# Patient Record
Sex: Male | Born: 1955 | Race: White | Hispanic: No | Marital: Married | Smoking: Former smoker
Health system: Southern US, Community
[De-identification: ages and names within clinical notes are randomized; demographics above are authoritative.]

## PROBLEM LIST (undated history)

## (undated) DIAGNOSIS — R9430 Abnormal result of cardiovascular function study, unspecified: Secondary | ICD-10-CM

## (undated) DIAGNOSIS — E782 Mixed hyperlipidemia: Secondary | ICD-10-CM

## (undated) DIAGNOSIS — C61 Malignant neoplasm of prostate: Secondary | ICD-10-CM

## (undated) DIAGNOSIS — Z9861 Coronary angioplasty status: Secondary | ICD-10-CM

## (undated) DIAGNOSIS — I5030 Unspecified diastolic (congestive) heart failure: Secondary | ICD-10-CM

## (undated) DIAGNOSIS — IMO0002 Reserved for concepts with insufficient information to code with codable children: Secondary | ICD-10-CM

## (undated) DIAGNOSIS — I10 Essential (primary) hypertension: Secondary | ICD-10-CM

## (undated) DIAGNOSIS — K219 Gastro-esophageal reflux disease without esophagitis: Secondary | ICD-10-CM

## (undated) DIAGNOSIS — K7689 Other specified diseases of liver: Secondary | ICD-10-CM

## (undated) DIAGNOSIS — K5790 Diverticulosis of intestine, part unspecified, without perforation or abscess without bleeding: Secondary | ICD-10-CM

## (undated) DIAGNOSIS — N289 Disorder of kidney and ureter, unspecified: Secondary | ICD-10-CM

## (undated) DIAGNOSIS — I2510 Atherosclerotic heart disease of native coronary artery without angina pectoris: Secondary | ICD-10-CM

## (undated) DIAGNOSIS — K589 Irritable bowel syndrome without diarrhea: Secondary | ICD-10-CM

## (undated) DIAGNOSIS — I729 Aneurysm of unspecified site: Secondary | ICD-10-CM

## (undated) DIAGNOSIS — K7460 Unspecified cirrhosis of liver: Secondary | ICD-10-CM

## (undated) DIAGNOSIS — Z91041 Radiographic dye allergy status: Secondary | ICD-10-CM

## (undated) DIAGNOSIS — I509 Heart failure, unspecified: Secondary | ICD-10-CM

## (undated) DIAGNOSIS — E119 Type 2 diabetes mellitus without complications: Secondary | ICD-10-CM

## (undated) DIAGNOSIS — R1030 Lower abdominal pain, unspecified: Secondary | ICD-10-CM

## (undated) DIAGNOSIS — Z01810 Encounter for preprocedural cardiovascular examination: Secondary | ICD-10-CM

## (undated) HISTORY — DX: Essential (primary) hypertension: I10

## (undated) HISTORY — DX: Mixed hyperlipidemia: E78.2

## (undated) HISTORY — DX: Irritable bowel syndrome, unspecified: K58.9

## (undated) HISTORY — DX: Atherosclerotic heart disease of native coronary artery without angina pectoris: I25.10

## (undated) HISTORY — DX: Morbid (severe) obesity due to excess calories: E66.01

## (undated) HISTORY — DX: Reserved for concepts with insufficient information to code with codable children: IMO0002

## (undated) HISTORY — PX: VASECTOMY: SHX75

## (undated) HISTORY — DX: Aneurysm of unspecified site: I72.9

## (undated) HISTORY — DX: Lower abdominal pain, unspecified: R10.30

## (undated) HISTORY — DX: Abnormal result of cardiovascular function study, unspecified: R94.30

## (undated) HISTORY — DX: Other specified diseases of liver: K76.89

## (undated) HISTORY — DX: Unspecified cirrhosis of liver: K74.60

## (undated) HISTORY — DX: Radiographic dye allergy status: Z91.041

## (undated) HISTORY — PX: CARPAL TUNNEL RELEASE: SHX101

## (undated) HISTORY — DX: Encounter for preprocedural cardiovascular examination: Z01.810

## (undated) HISTORY — PX: BACK SURGERY: SHX140

## (undated) HISTORY — PX: OTHER SURGICAL HISTORY: SHX169

## (undated) HISTORY — DX: Malignant neoplasm of prostate: C61

## (undated) HISTORY — DX: Diverticulosis of intestine, part unspecified, without perforation or abscess without bleeding: K57.90

## (undated) HISTORY — PX: CARDIAC CATHETERIZATION: SHX172

## (undated) HISTORY — DX: Unspecified diastolic (congestive) heart failure: I50.30

## (undated) HISTORY — DX: Gastro-esophageal reflux disease without esophagitis: K21.9

## (undated) HISTORY — DX: Coronary angioplasty status: Z98.61

---

## 1998-06-27 HISTORY — PX: CHOLECYSTECTOMY: SHX55

## 2003-03-03 ENCOUNTER — Ambulatory Visit (HOSPITAL_COMMUNITY): Admission: RE | Admit: 2003-03-03 | Discharge: 2003-03-03 | Payer: Self-pay | Admitting: Internal Medicine

## 2003-03-03 HISTORY — PX: COLONOSCOPY: SHX174

## 2003-03-03 HISTORY — PX: UPPER GASTROINTESTINAL ENDOSCOPY: SHX188

## 2003-07-30 ENCOUNTER — Ambulatory Visit (HOSPITAL_COMMUNITY): Admission: RE | Admit: 2003-07-30 | Discharge: 2003-07-31 | Payer: Self-pay | Admitting: Cardiology

## 2003-08-03 ENCOUNTER — Observation Stay (HOSPITAL_COMMUNITY): Admission: EM | Admit: 2003-08-03 | Discharge: 2003-08-04 | Payer: Self-pay | Admitting: Cardiology

## 2004-10-16 ENCOUNTER — Ambulatory Visit: Payer: Self-pay | Admitting: Cardiology

## 2004-11-01 ENCOUNTER — Ambulatory Visit: Payer: Self-pay | Admitting: Cardiology

## 2004-11-07 ENCOUNTER — Ambulatory Visit: Payer: Self-pay | Admitting: Cardiology

## 2004-11-07 ENCOUNTER — Inpatient Hospital Stay (HOSPITAL_BASED_OUTPATIENT_CLINIC_OR_DEPARTMENT_OTHER): Admission: RE | Admit: 2004-11-07 | Discharge: 2004-11-07 | Payer: Self-pay | Admitting: Cardiology

## 2004-11-23 ENCOUNTER — Ambulatory Visit: Payer: Self-pay | Admitting: Cardiology

## 2004-11-27 ENCOUNTER — Ambulatory Visit: Payer: Self-pay | Admitting: Internal Medicine

## 2004-12-13 ENCOUNTER — Ambulatory Visit: Payer: Self-pay | Admitting: Cardiology

## 2005-01-18 ENCOUNTER — Ambulatory Visit: Payer: Self-pay | Admitting: Internal Medicine

## 2005-07-31 ENCOUNTER — Ambulatory Visit: Payer: Self-pay | Admitting: Internal Medicine

## 2005-10-18 ENCOUNTER — Ambulatory Visit: Payer: Self-pay | Admitting: Cardiology

## 2005-10-22 ENCOUNTER — Ambulatory Visit: Payer: Self-pay | Admitting: Cardiology

## 2005-10-22 ENCOUNTER — Inpatient Hospital Stay (HOSPITAL_BASED_OUTPATIENT_CLINIC_OR_DEPARTMENT_OTHER): Admission: RE | Admit: 2005-10-22 | Discharge: 2005-10-22 | Payer: Self-pay | Admitting: Cardiology

## 2005-10-26 ENCOUNTER — Ambulatory Visit: Payer: Self-pay | Admitting: Cardiology

## 2005-11-07 ENCOUNTER — Ambulatory Visit: Payer: Self-pay | Admitting: Cardiology

## 2005-12-05 ENCOUNTER — Ambulatory Visit: Payer: Self-pay | Admitting: Cardiology

## 2006-03-14 ENCOUNTER — Ambulatory Visit: Payer: Self-pay | Admitting: Internal Medicine

## 2006-05-28 ENCOUNTER — Ambulatory Visit: Payer: Self-pay | Admitting: Cardiology

## 2006-11-07 ENCOUNTER — Ambulatory Visit: Payer: Self-pay | Admitting: Internal Medicine

## 2007-03-18 ENCOUNTER — Ambulatory Visit: Payer: Self-pay | Admitting: Cardiology

## 2007-03-18 ENCOUNTER — Inpatient Hospital Stay (HOSPITAL_COMMUNITY): Admission: AD | Admit: 2007-03-18 | Discharge: 2007-03-20 | Payer: Self-pay | Admitting: Cardiovascular Disease

## 2007-03-26 ENCOUNTER — Ambulatory Visit: Payer: Self-pay | Admitting: Physician Assistant

## 2007-03-27 ENCOUNTER — Ambulatory Visit (HOSPITAL_COMMUNITY): Admission: RE | Admit: 2007-03-27 | Discharge: 2007-03-27 | Payer: Self-pay | Admitting: Cardiovascular Disease

## 2007-03-27 ENCOUNTER — Ambulatory Visit: Payer: Self-pay | Admitting: Vascular Surgery

## 2007-04-01 ENCOUNTER — Encounter: Payer: Self-pay | Admitting: Physician Assistant

## 2007-04-01 ENCOUNTER — Ambulatory Visit: Payer: Self-pay | Admitting: Cardiology

## 2007-08-26 ENCOUNTER — Ambulatory Visit: Payer: Self-pay | Admitting: Cardiology

## 2007-08-28 HISTORY — PX: OTHER SURGICAL HISTORY: SHX169

## 2007-09-05 ENCOUNTER — Encounter: Payer: Self-pay | Admitting: Cardiology

## 2008-09-02 ENCOUNTER — Ambulatory Visit: Payer: Self-pay | Admitting: Cardiology

## 2008-09-02 ENCOUNTER — Encounter: Payer: Self-pay | Admitting: Physician Assistant

## 2008-09-10 ENCOUNTER — Ambulatory Visit: Payer: Self-pay | Admitting: Cardiology

## 2008-09-10 ENCOUNTER — Encounter: Payer: Self-pay | Admitting: Physician Assistant

## 2008-10-07 ENCOUNTER — Ambulatory Visit: Payer: Self-pay | Admitting: Cardiology

## 2008-12-07 ENCOUNTER — Ambulatory Visit: Payer: Self-pay | Admitting: Cardiology

## 2008-12-16 ENCOUNTER — Encounter: Payer: Self-pay | Admitting: Cardiology

## 2009-01-10 ENCOUNTER — Encounter: Payer: Self-pay | Admitting: Cardiology

## 2009-08-01 ENCOUNTER — Encounter: Payer: Self-pay | Admitting: Cardiology

## 2009-08-01 ENCOUNTER — Ambulatory Visit: Payer: Self-pay | Admitting: Cardiology

## 2009-08-01 ENCOUNTER — Encounter: Payer: Self-pay | Admitting: Physician Assistant

## 2009-08-02 ENCOUNTER — Encounter: Payer: Self-pay | Admitting: Cardiology

## 2009-09-19 ENCOUNTER — Encounter: Payer: Self-pay | Admitting: Cardiology

## 2009-10-27 ENCOUNTER — Encounter: Payer: Self-pay | Admitting: Cardiology

## 2009-11-02 ENCOUNTER — Encounter: Payer: Self-pay | Admitting: Cardiology

## 2009-11-07 ENCOUNTER — Encounter: Payer: Self-pay | Admitting: Cardiology

## 2009-11-23 ENCOUNTER — Encounter: Payer: Self-pay | Admitting: Cardiology

## 2009-12-26 ENCOUNTER — Ambulatory Visit: Payer: Self-pay | Admitting: Cardiology

## 2009-12-26 DIAGNOSIS — K7689 Other specified diseases of liver: Secondary | ICD-10-CM | POA: Insufficient documentation

## 2009-12-26 DIAGNOSIS — E785 Hyperlipidemia, unspecified: Secondary | ICD-10-CM | POA: Insufficient documentation

## 2009-12-26 DIAGNOSIS — I2510 Atherosclerotic heart disease of native coronary artery without angina pectoris: Secondary | ICD-10-CM | POA: Insufficient documentation

## 2010-02-09 ENCOUNTER — Encounter: Payer: Self-pay | Admitting: Cardiology

## 2010-09-26 NOTE — Letter (Signed)
Summary: Letter/ FAXED [facility]  Letter/ FAXED [facility]   Imported By: Bartholomew Boards 02/09/2010 11:09:04  _____________________________________________________________________  External Attachment:    Type:   Image     Comment:   External Document

## 2010-09-26 NOTE — Consult Note (Signed)
Summary: CARDIOLOGY CONSULT/ [place] [facility]/ [facility]   Imported By: Delfino Lovett 10/31/2009 09:44:28  _____________________________________________________________________  External Attachment:    Type:   Image     Comment:   External Document

## 2010-09-26 NOTE — Letter (Signed)
Summary: [place] D/C DR. Weldon Picking [facility]  [facility] D/C DR. [name]   Imported By: Delfino Lovett 11/23/2009 10:01:18  _____________________________________________________________________  External Attachment:    Type:   Image     Comment:   External Document

## 2010-09-26 NOTE — Letter (Signed)
Summary: [facility] Correspondence/ [facility]  External Correspondence/ [facility]   Imported By: Bartholomew Boards 10/28/2009 15:55:05  _____________________________________________________________________  External Attachment:    Type:   Image     Comment:   External Document

## 2010-09-26 NOTE — Assessment & Plan Note (Signed)
Summary: eph r/s from no show   Visit Type:  Follow-up Primary Provider:  Dr. [name]   History of Present Illness: the patient is a 55 year old male with a history of prior coronary intervention with a bare-metal stent most recently 2008 of the circumflex coronary artery. The patient was hospitalized in December of 2010 for subdural chest pain. He ruled out for myocardial infarction by enzymes. Cardiolite stress study was performed which was negative for ischemia. The patient had normal LV function. The patient states he is doing well. He reports no chest pain shortness of breath orthopnea PND. He has quit smoking. Although his compliance with his medical regimen is not certain about his medications. He stated his wife keeps track of his drug regimen. from a cardiovascular standpoint the patient is otherwise stable he does have a history of steatohepatosis and is followed by Dr. Laural Golden  Preventive Screening-Counseling & Management  Alcohol-Tobacco     Smoking Status: quit  Comments: Pt quit smoking 10 yrs ago, smoked for about 30 yrs.  Current Problems (verified): 1)  Fatty Liver Disease  (ICD-571.8) 2)  Dyslipidemia  (ICD-272.4) 3)  Coronary Artery Disease, S/p Ptca  (ICD-414.9) 4)  Coronary Artery Disease  (ICD-414.00)  Current Medications (verified): 1)  Furosemide 40 Mg Tabs (Furosemide) .... Take 1 Tablet By Mouth Once A Day 2)  Amlodipine Besylate 10 Mg Tabs (Amlodipine Besylate) .... Take 1 Tablet By Mouth Once A Day 3)  Nitroglycerin 0.4 Mg Subl (Nitroglycerin) .... Place 1 Tablet Under Tongue As Directed 4)  Simvastatin 80 Mg Tabs (Simvastatin) .... Take One Tablet By Mouth Daily At Bedtime 5)  Prilosec Otc 20 Mg Tbec (Omeprazole Magnesium) .... Take 1 Tablet By Mouth Two Times A Day 6)  Aspir-Low 81 Mg Tbec (Aspirin) .... Take 1 Tablet By Mouth Once A Day 7)  Toprol Xl 25 Mg Xr24h-Tab (Metoprolol Succinate) .... Take 1 Tablet By Mouth Once A Day 8)  Lisinopril 40 Mg  Tabs (Lisinopril) .... Take 1 Tablet By Mouth Once A Day 9)  Ursodiol 250 Mg Tabs (Ursodiol) .... Take 1 Tablet By Mouth Once A Day 10)  Multivitamins  Tabs (Multiple Vitamin) .... Take 1 Tablet By Mouth Once A Day 11)  Vitamin E 400 Unit Caps (Vitamin E) .... Take 1 Tablet By Mouth Once A Day 12)  Spironolactone 25 Mg Tabs (Spironolactone) .... Take 1 Tablet By Mouth Once A Day 13)  Potassium 99 Mg Tabs (Potassium) .... Take 1 Tablet By Mouth Once A Day  Allergies (verified): 1)  ! * Ivp Dye 2)  Plavix (Clopidogrel Bisulfate)  Comments:  Nurse/Medical Assistant: The patient's medications were reviewed with the patient and were updated in the Medication List. Pt brought list that does not have dosages. He does not know his medications. He has been instructed to contact our office with accurate medication list.  Gurney Maxin, RN, BSN (Dec 26, 2009 2:42 PM)  Clinical Review Panels:  Cardiac Imaging Cardiac Cath Findings severe obtuse marginal one stenosis moderate diffuse LAD and RCA disease normal LVEF status post bare-metal stenting of left circumflex. (03/20/2007)    Past History:  Past Medical History: Last updated: 10/08/2008 multivessel CAD A.  status post multiple prior PCI's B.  status post bare-metal stenting of CFX, July 3817 C.  complicated by right femoral artery pseudoaneurysm D.  normal LVEF Plavix allergy A.  previously treated with Ticlid liver cirrhosis A.  non-alcoholic steatohepatitis mixed dyslipidemia contrast dye intolerance, secondary to tachycardia hypertension morbid  obesity stable, small right lung nodules chronic lower extremity edema GERD diverticulosis irritable bowel syndrome  Past Surgical History: Last updated: 10/08/2008 status post cholecystectomy status post right knee surgery status post vasectomy  Family History: Last updated: 10/08/2008 Family History of Coronary Artery Disease:   Social History: Last updated:  10/08/2008 Tobacco Use - No.  Alcohol Use - no  Risk Factors: Smoking Status: quit (12/26/2009)  Social History: Smoking Status:  quit  Review of Systems  The patient denies fatigue, malaise, fever, weight gain/loss, vision loss, decreased hearing, hoarseness, chest pain, palpitations, shortness of breath, prolonged cough, wheezing, sleep apnea, coughing up blood, abdominal pain, blood in stool, nausea, vomiting, diarrhea, heartburn, incontinence, blood in urine, muscle weakness, joint pain, leg swelling, rash, skin lesions, headache, fainting, dizziness, depression, anxiety, enlarged lymph nodes, easy bruising or bleeding, and environmental allergies.    Vital Signs:  Patient profile:   55 year old male Height:      74 inches Weight:      317 pounds BMI:     40.85 Pulse rate:   58 / minute BP sitting:   133 / 74  (left arm) Cuff size:   large  Vitals Entered By: Gurney Maxin, RN, BSN (Dec 26, 2009 2:34 PM)  Nutrition Counseling: Patient's BMI is greater than 25 and therefore counseled on weight management options. Comments Follow up visit    Physical Exam  Additional Exam:  General: Well-developed, well-nourished in no distress head: Normocephalic and atraumatic eyes PERRLA/EOMI intact, conjunctiva and lids normal nose: No deformity or lesions mouth normal dentition, normal posterior pharynx neck: Supple, no JVD.  No masses, thyromegaly or abnormal cervical nodes lungs: Normal breath sounds bilaterally without wheezing.  Normal percussion heart: regular rate and rhythm with normal S1 and S2, no S3 or S4.  PMI is normal.  No pathological murmurs abdomen: Normal bowel sounds, abdomen is soft and nontender without masses, organomegaly or hernias noted.  No hepatosplenomegaly musculoskeletal: Back normal, normal gait muscle strength and tone normal pulsus: Pulse is normal in all 4 extremities Extremities: No peripheral pitting edema neurologic: Alert and oriented x 3 skin:  Intact without lesions or rashes cervical nodes: No significant adenopathy psychologic: Normal affect    Impression & Recommendations:  Problem # 1:  CORONARY ARTERY DISEASE, S/P PTCA (ICD-414.9) the patient had a stress test a few months ago. This was negative for ischemia. He reports no recurrent chest pain. Continue current medical regimen His updated medication list for this problem includes:    Amlodipine Besylate 10 Mg Tabs (Amlodipine besylate) .Marland Kitchen... Take 1 tablet by mouth once a day    Nitroglycerin 0.4 Mg Subl (Nitroglycerin) .Marland Kitchen... Place 1 tablet under tongue as directed    Aspir-low 81 Mg Tbec (Aspirin) .Marland Kitchen... Take 1 tablet by mouth once a day    Toprol Xl 25 Mg Xr24h-tab (Metoprolol succinate) .Marland Kitchen... Take 1 tablet by mouth once a day    Lisinopril 40 Mg Tabs (Lisinopril) .Marland Kitchen... Take 1 tablet by mouth once a day  Problem # 2:  DYSLIPIDEMIA (ICD-272.4) the patient will remain a lipid lowering drug therapy. He states that his primary care physician is following his lipid panel. His updated medication list for this problem includes:    Simvastatin 80 Mg Tabs (Simvastatin) .Marland Kitchen... Take one tablet by mouth daily at bedtime  Problem # 3:  FATTY LIVER DISEASE (ICD-571.8) Assessment: Comment Only  Patient Instructions: 1)  Please, verfiy medications, dosages and frequency and contact our office with any  differences from this list.  2)  Follow up in  1 year.

## 2010-09-26 NOTE — Letter (Signed)
Summary: Appointment -missed   [facility] [address]   [place], [place] [address]   Phone: [phone]  Fax: [phone]     November 23, 2009 MRN: [id number]     [place]  [address]     Dear Billy Davis,  Our records indicate you missed your appointment on November 23, 2009                        with Dr.  Dannielle Burn.   It is very important that we reach you to reschedule this appointment. We look forward to participating in your health care needs.   Please contact us at the number listed above at your earliest convenience to reschedule this appointment.   Sincerely,    Public relations account executive

## 2010-12-06 DIAGNOSIS — R079 Chest pain, unspecified: Secondary | ICD-10-CM

## 2010-12-27 ENCOUNTER — Other Ambulatory Visit: Payer: Self-pay | Admitting: Cardiology

## 2010-12-27 ENCOUNTER — Encounter: Payer: Self-pay | Admitting: Cardiology

## 2010-12-27 ENCOUNTER — Ambulatory Visit (INDEPENDENT_AMBULATORY_CARE_PROVIDER_SITE_OTHER): Payer: Managed Care, Other (non HMO) | Admitting: Cardiology

## 2010-12-27 ENCOUNTER — Encounter: Payer: Self-pay | Admitting: *Deleted

## 2010-12-27 VITALS — BP 126/83 | HR 91 | Ht 74.0 in | Wt 330.5 lb

## 2010-12-27 DIAGNOSIS — K7689 Other specified diseases of liver: Secondary | ICD-10-CM

## 2010-12-27 DIAGNOSIS — R072 Precordial pain: Secondary | ICD-10-CM

## 2010-12-27 DIAGNOSIS — I5189 Other ill-defined heart diseases: Secondary | ICD-10-CM | POA: Insufficient documentation

## 2010-12-27 DIAGNOSIS — K219 Gastro-esophageal reflux disease without esophagitis: Secondary | ICD-10-CM | POA: Insufficient documentation

## 2010-12-27 DIAGNOSIS — I10 Essential (primary) hypertension: Secondary | ICD-10-CM | POA: Insufficient documentation

## 2010-12-27 DIAGNOSIS — I2510 Atherosclerotic heart disease of native coronary artery without angina pectoris: Secondary | ICD-10-CM

## 2010-12-27 DIAGNOSIS — I259 Chronic ischemic heart disease, unspecified: Secondary | ICD-10-CM

## 2010-12-27 DIAGNOSIS — I519 Heart disease, unspecified: Secondary | ICD-10-CM

## 2010-12-27 NOTE — Patient Instructions (Signed)
   2 day Exercise Cardiolite - hold beta blockers x 24 hours prior to test If the results of your test are normal or stable, you will receive a letter.  If they are abnormal, the nurse will contact you by phone. Your physician wants you to follow up in: 6 months.  You will receive a reminder letter in the mail one-two months in advance.  If you don't receive a letter, please call our office to schedule the follow up appointment

## 2010-12-27 NOTE — Assessment & Plan Note (Signed)
Blood pressure controlled. Continue current medical therapy

## 2010-12-27 NOTE — Assessment & Plan Note (Signed)
His negative stress test the patient will need an appointment with gastroenterology

## 2010-12-27 NOTE — Assessment & Plan Note (Addendum)
Coronary artery disease: Stop status post multiple PCI's details as outlined above. Nuclear perfusion study December 2000 and negative for ischemia. The patient was never scheduled for his Cardiolite study. We will schedule him for a two-day Cardiolite study and we'll hold his beta blocker 24 hours prior to the test. I told the patient is test is positive he may need a heart catheterization. He has had complications with a pseudoaneurysm and still has some pain in the right groin. A future catheterization will need to be done from the arm. Of note is that the patient has a contrast dye allergy

## 2010-12-27 NOTE — Progress Notes (Signed)
HPI The patient is a 55 year old male recently admitted to [facility]. He has history of coronary artery disease status post prior multiple PCI's. He status post bare-metal stent to the circumflex in August 7482 complicated by right femoral aneurysms. His last catheterization also showed moderate diffuse disease in the RCA and LAD. He had a normal nuclear study in December of 2010 with an ejection fraction of 53%. Of note is that the patient is a contrast allergy. He has chronic diastolic dysfunction chronic lower extremity edema. The patient recently presented with atypical chest pain. He ruled out for myocardial infarction had no EKG changes. The patient's chest pain actually did not resolve with nitroglycerin and spontaneously aborted. At the time of consultation the patient was walking in the hall without any symptoms. Given his ongoing risk profile we decided to perform an outpatient Cardiolite study. In addition the patient has a history of nonalcoholic cirrhosis marked steatosis as well as hypertension and irritable bowel syndrome. Unfortunate patient never got scheduled to the hospital for his Cardiolite study. He tells me however that the insurance company has already improved the test. His symptoms have really not improved much since hospitalization. He still gets occasional chest pain but it's mainly when he sitting still. He personally thinks is related to his stomach and he started scheduled appointment with the gastroenterologist. Typically the pain lasts a few minutes somewhat more pronounced postprandial and appears to be no exercise component related to it. Also nitroglycerin does not improve the pain.  Allergies  Allergen Reactions  . Clopidogrel Bisulfate     REACTION: Rash    No current outpatient prescriptions on file prior to visit.    Past Medical History  Diagnosis Date  . CAD, multiple vessel   . Mixed dyslipidemia   . Unspecified essential hypertension   . Chest  pain, unspecified   . Cirrhosis of liver without mention of alcohol   . Esophageal reflux   . Unspecified diastolic heart failure   . Coronary atherosclerosis of native coronary artery   . Other chronic nonalcoholic liver disease   . Postsurgical percutaneous transluminal coronary angioplasty status   . Morbid obesity   . Diverticulosis   . IBS (irritable bowel syndrome)     Past Surgical History  Procedure Date  . Cholecystectomy   . Right knee surgery   . Vasectomy     Family History  Problem Relation Age of Onset  . Coronary artery disease      family history    History   Social History  . Marital Status: Married    Spouse Name: MARTHA    Number of Children: N/A  . Years of Education: N/A   Occupational History  . [employer]    Social History Main Topics  . Smoking status: Former Smoker -- 3.0 packs/day for 30 years    Types: Cigarettes    Quit date: 08/27/1998  . Smokeless tobacco: Never Used   Comment: Pt quit smoking 10 yrs ago, smoked for about 30 yrs.  . Alcohol Use: No  . Drug Use: No  . Sexually Active: Not on file   Other Topics Concern  . Not on file   Social History Narrative   Lives in [place], [place] at [place] of systems:Pertinent positives as outlined above. The remainder of the 18  point review of systems is negative  PHYSICAL EXAM BP 126/83  Pulse 91  Ht 6' 2"  (1.88 m)  Wt 330 lb 8 oz (  149.914 kg)  BMI 42.43 kg/m2  General: Well-developed, well-nourished in no distress Head: Normocephalic and atraumatic Eyes:PERRLA/EOMI intact, conjunctiva and lids normal Ears: No deformity or lesions Mouth:normal dentition, normal posterior pharynx Neck: Supple, no JVD.  No masses, thyromegaly or abnormal cervical nodes Lungs: Normal breath sounds bilaterally without wheezing.  Normal percussion Cardiac: regular rate and rhythm with normal S1 and S2, no S3 or S4.  PMI is normal.  No pathological murmurs Abdomen: Normal bowel  sounds, abdomen is soft and nontender without masses, organomegaly or hernias noted.  No hepatosplenomegaly MSK: Back normal, normal gait muscle strength and tone normal Vascular: Pulse is normal in all 4 extremities Extremities: No peripheral pitting edema Neurologic: Alert and oriented x 3 Skin: Intact without lesions or rashes Lymphatics: No significant adenopathy Psychologic: Normal affect  ECG:  ASSESSMENT AND PLAN

## 2011-01-09 ENCOUNTER — Encounter: Payer: Self-pay | Admitting: Cardiology

## 2011-01-09 DIAGNOSIS — I2510 Atherosclerotic heart disease of native coronary artery without angina pectoris: Secondary | ICD-10-CM

## 2011-01-09 NOTE — Assessment & Plan Note (Signed)
[place] OFFICE NOTE   ANANT, AGARD                       MRN:          [id number]  DATE:03/26/2007                            DOB:          [birth year]    CARDIOLOGIST:  Ernestine Mcmurray, MD.   PRIMARY CARE PHYSICIAN:  [name], MD.   HISTORY OF PRESENT ILLNESS:  [name] is a 55 year old male patient  with a history of coronary disease, who recently presented with angina.  He underwent cardiac catheterization and percutaneous coronary  intervention of the obtuse marginal branch by Dr. Burt Knack March 20, 2007.  The patient was discharged to home in stable condition.  He called the  office and noted that he was having a lot of groin pain and was added  onto the schedule today.   The patient notes that when he left the hospital he was having some mild  groin and scrotal pain but it has continued to get worse since he has  been home.  He denies fevers or chills.  Denies any bleeding or drainage  from the catheterization site.  Denies any significant back pain.  Denies any syncope.   He denies any recurrent chest pain or shortness of breath.   CURRENT MEDICATIONS:  1. Prilosec OTC.  2. Urso 250 mg daily.  3. Aspirin 325 mg daily.  4. Metoprolol 50 mg b.i.d.  5. Dicyclomine 20 mg daily.  6. Fish oil.  7. Lisinopril 40 mg daily.  8. Generic Lotrel 10/40 mg daily.   ALLERGIES:  PREDNISONE causes increased heart rate.  IVP DYE causes  increased heart rate.  He is allergic to PLAVIX.   PHYSICAL EXAMINATION:  He is a well-nourished, well-developed male in  obvious discomfort.  Blood pressure of 151/91, pulse 74.  HEENT:  Normal.  NECK:  Without JVD.  CARDIAC:  S1-S2.  Regular rate and rhythm without murmurs.  LUNGS:  Clear to auscultation bilaterally without wheeze, rhonchi or  rales.  ABDOMEN:  Soft and nontender.  EXTREMITIES:  Without edema.  GENITOURINARY:  Right groin with questionable  small hematoma.  No  pulsatile mass.  No bruit.  His scrotum is three times normal size.  His  testicles are exquisitely tender to light palpation.  His entire scrotal  sac is ecchymotic.   IMPRESSION:  1. Right groin hematoma and massive scrotal swelling, status post      cardiac catheterization.  2. Coronary artery disease.      a.     Status post bare metal stent to the obtuse marginal #1.      b.     History of stenting to the right coronary artery in 2004.  3. Preserved left ventricular function.  4. History of nonalcoholic cirrhosis.  5. History of PLAVIX allergy.  6. History of IV DYE intolerance.  7. Hypertension.  8. Dyslipidemia.   PLAN:  The patient presents to the office today for a chief complaint of  right groin and scrotal pain since cardiac catheterization.  I am  concerned about the contents of his scrotum.  There very  well could be  vascular compromise.  I think his groin overall is fairly stable.  In  any event, we will set him up for a right groin ultrasound to rule out  pseudoaneurysm.  We will also get a scrotal ultrasound.  We will also  get a CBC.  I discussed the patient's case today with Dr. Dannielle Burn over  the telephone.  We will also set him up for urgent referral to urology  for further evaluation.  Hopefully, we can get him in to the urologist  in [place].  If not, we will set him up with one of the urologists in  [place] today.  He will keep his scheduled follow-up next week.      Richardson Dopp, PA-C  Electronically Signed      Ernestine Mcmurray, MD,FACC  Electronically Signed   SW/MedQ  DD: 03/26/2007  DT: 03/26/2007  Job #: [id number]   cc:   [name], MD

## 2011-01-09 NOTE — Assessment & Plan Note (Signed)
[facility] OFFICE NOTE   Billy Davis, Billy Davis                       MRN:          [id number]  DATE:04/01/2007                            DOB:          1956/01/03    REASON FOR VISIT:  Scheduled post hospital followup.   [name] returns to the clinic today for his formal post hospital  followup.  However, he was just here on March 26, 2007 when he was seen  as an add-on for evaluation of significant discomfort and swelling of  the right groin incision site.  He was seen here in the clinic by Richardson Dopp PA-C, who referred him for immediate ultrasound of the groin for  evaluation of a pseudoaneurysm.  This, in fact, was the case, but  patient also had significant scrotal swelling and was, therefore, also  referred to a urologist. Reportedly, there was no intrinsic pathology of  the scrotum or testicles and, therefore, was all attributed to edema  related to his recent catheterization.   Patient informs me today that he did present for scheduled  pseudoaneurysm compression at [facility] with Dr. Sherren Mocha last week. However, they felt that this was already showing  evidence of thrombosis and, therefore, recommendation was to continue  treating conservatively.   Clinically, [name] reports significant improvement of the scrotal  edema over these last 5 days or so.  He is not experiencing any pain.  He also has no chest pain.  Of note, patient has requested clearance to  return to work as of today.   Electrocardiogram today revealed sinus bradycardia at 56 BPM with normal  axis, and no ischemic changes.   MEDICATIONS:  1. Full-dose aspirin.  2. Ticlid 250 mg b.i.d.  3. Metoprolol 50 b.i.d.  4. Prilosec OTC 20 daily.  5. Lisinopril 40 daily.  6. Lotrel 10/40 daily.   PHYSICAL EXAMINATION:  Blood pressure 133/80.  Pulse 57, regular.  Weight 305.8.  GENERAL:  A 55 year old male,  morbidly obese, sitting upright in no  distress.  HEENT:  Normocephalic and atraumatic.  NECK:  Palpable carotid pulses without bruits.  LUNGS:  Clear to auscultation in all fields.  HEART:  Regular rate and rhythm (S1 and S2).  No significant murmurs.  No rubs.  ABDOMEN:  Protuberant, nontender.  EXTREMITIES:  Right groin reveals no peri-incisional ecchymosis or  hematoma.  Palpable femoral pulse with no associated bruit.  The scrotal  area notable for some faint area of bruising with no edema of the  scrotal sac.  Distal right posterior tibialis pulses intact.  NEUROLOGIC:  No focal deficits.   IMPRESSION:  1. Status post right femoral artery pseudoaneurysm.      a.     Managed conservatively.  2. Coronary artery disease.      a.     Status post recent bare-metal stent 1st obtuse marginal       artery.      b.     Moderate residual left anterior descending and right       coronary artery disease.  c.     Normal left ventricular function.      d.     Status post TAXUS stenting mid right coronary artery,       December 2004.  3. Plavix allergy.      a.     On Ticlid.  4. Contrast dye intolerance.      a.     Secondary to tachycardia.  5. Morbid obesity.  6. Hypertension.  7. Dyslipidemia.  8. History of nonalcoholic cirrhosis.   PLAN:  1. Schedule repeat right groin ultrasound for reassessment of      pseudoaneurysm and assurance that this has completely thrombosed.  2. Check 2-week followup CBC on Ticlid, as recommended.  3. Continue Ticlid for full 4 weeks.  Patient will then remain on      aspirin indefinitely.  4. Check fasting lipid profile for reassessment of current lipid      management regimen.  Patient has inquired in to the feasibility of      adding red yeast rice to his current regimen.  5. Schedule return clinic followup with myself and Dr. Dannielle Burn in 3      months.      Gene Serpe, PA-C  Electronically Signed      Ernestine Mcmurray, MD,FACC   Electronically Signed   GS/MedQ  DD: 04/01/2007  DT: 04/01/2007  Job #: [id number]   cc:   [name], MD

## 2011-01-09 NOTE — Cardiovascular Report (Signed)
Billy Davis, Billy Davis                ACCOUNT NO.:  [id number]   MEDICAL RECORD NO.:  [id number]          PATIENT TYPE:  INP   LOCATION:  6527                         FACILITY:  [facility]   PHYSICIAN:  Juanda Bond. Burt Knack, MD  DATE OF BIRTH:  1956-05-26   DATE OF PROCEDURE:  03/20/2007  DATE OF DISCHARGE:  03/20/2007                            CARDIAC CATHETERIZATION   PROCEDURE:  Left heart catheterization, selective coronary angiography,  left ventricular angiography and PTCA and stenting of the first OM  branch of the left circumflex.   INDICATIONS:  Billy Davis is a 55 year old gentleman with known coronary  artery disease.  He presents with his typical angina.  His symptoms are  crescendo in nature, and he was hospitalized and referred for cardiac  catheterization.  He has a prior stenting of the mid-right coronary  artery.   Risks and indications of procedure were explained to the patient in  detail.  Informed consent was obtained.  The right groin was prepped and  draped and anesthetized with 1% lidocaine.  Using modified Seldinger  technique, a 6-French sheath was placed in the right femoral artery.  Multiple views of the left and right coronary arteries were taken using  standard Judkins catheters.  Following selective coronary angiography,  an angled pigtail was inserted in the left ventricle, and pressures were  recorded.  Left ventriculogram was performed, and a pullback across the  aortic valve was done.   At the conclusion of the diagnostic procedure, I elected to intervene on  an ulcerated lesion in the first OM branch of the left circumflex.  Angiomax was used for anticoagulation.  The patient was given 500 mg of  ticlopidine as he has of Plavix allergy.  Once therapeutic ACT was  achieved, Cougar guidewire was passed into the distal OM branch.  The  lesion was predilated with a 2.5 x 15-mm Maverick balloon.  Following  predilatation, a 3.5 x 28-mm Liberte stent was deployed  at 16  atmospheres.  There was excellent stent expansion with TIMI 3 flow in  the vessel following placement.  The stent was then postdilated with 4.0  x 20-mm DuraStar noncompliant balloon which was inflated to 18  atmospheres on serial inflations.  At the completion of the procedure,  the patient was chest pain free, and there was TIMI 3 flow in the vessel  with a well-expanded stent.  All catheter exchanges were performed over  a guidewire.  There were no immediate complications.   FINDINGS:  Aortic pressure 131/85 with a mean of 106, left ventricular  pressure is 128/16.   The left mainstem has luminal irregularities but no significant  angiographic stenosis.   The left mainstem bifurcates into the LAD and left circumflex.  The LAD  is a large-caliber vessel that courses down and reaches the LV apex.  Proximal portion of the LAD in the area of the first septal perforator  has a diffuse 50% stenosis.  There is a diagonal branch that arises from  this area.  The diagonal is widely patent.  The remaining portions of  the mid and  distal LAD have diffuse nonobstructive plaque without  significant focal stenosis.   The left circumflex is a large-caliber caliber vessel.  It gives off a  large first OM branch that has an 80% stenosis in its proximal portion.  The AV groove circumflex continues down and provides a small left  posterolateral branch.  There is no significant angiographic disease in  the AV groove circumflex.   The right coronary artery is diffusely diseased.  There are luminal  irregularities in the proximal portion.  The midportion in the stented  segment that is ectatic.  Distally, there is diffuse disease with 50%  stenosis just before the bifurcation of the PDA and posterior AV  segment.  The PDA and posterolateral branches have no significant  angiographic disease   Left ventriculography demonstrates normal LV systolic function with an  LVEF of 60%.  There is no  mitral regurgitation.   ASSESSMENT:  1. Severe obtuse marginal 1 stenosis.  2. Moderate diffuse disease in the left anterior descending artery and      right coronary artery.  3. Normal left ventricular systolic function.  4. Successful percutaneous coronary intervention of the left      circumflex with a bare-metal stent.   Will continue aspirin indefinitely.  The patient should be on  ticlopidine for 30 days with close monitoring of his CBC.      Juanda Bond. Burt Knack, MD  Electronically Signed     MDC/MEDQ  D:  03/20/2007  T:  03/21/2007  Job:  [id number]   cc:   Ernestine Mcmurray, MD,FACC

## 2011-01-09 NOTE — Discharge Summary (Signed)
Billy Davis, Billy Davis                ACCOUNT NO.:  [id number]   MEDICAL RECORD NO.:  [id number]          PATIENT TYPE:  INP   LOCATION:  6527                         FACILITY:  [facility]   PHYSICIAN:  Juanda Bond. Burt Knack, MD  DATE OF BIRTH:  01-Oct-1955   DATE OF ADMISSION:  03/18/2007  DATE OF DISCHARGE:                               DISCHARGE SUMMARY   PROCEDURES:  1. Cardiac catheterization.  2. Coronary arteriogram.  3. Left ventriculogram.  4. PTCA and stent to the OM.   PRIMARY FINAL DISCHARGE DIAGNOSIS:  Unstable anginal pain.   SECONDARY DIAGNOSES:  1. Status post stent to the right coronary artery in 2004.  2. Preserved left ventricular function with an ejection fraction of      60% at catheterization.  3. Nonalcoholic cirrhosis.  4. Remote history of tobacco.  5. Family history of coronary artery disease in his father.  6. Hypertension.  7. Allergy or intolerance to IV DYE and PLAVIX.  8. Obesity.  9. Hypertension.  10.Status post right knee surgery and cholecystectomy.   TIME AT DISCHARGE:  37 minutes   HOSPITAL COURSE:  Billy Davis is a 55 year old male with previous  coronary artery disease.  He went to [facility] on March 18, 2007,  for chest pain.  He was seen by cardiology and referred to [facility]  for further evaluation and catheterization.   His cardiac enzymes were negative for MI.  The cardiac catheterization  showed an 80% stenosis in the OM which was treated with PTCA and a bare-  metal stent.  His EF was normal and residual coronary artery disease of  50% in the LAD is to be treated medically.  His RCA was ectatic but the  stent was patent and there was a 50% distal stenosis as well.   Billy Davis is on Ticlid because of his allergy to Plavix.  Postprocedure, his troponin elevated slightly at 0.11 but his CK-MB was  negative.  His white count was also slightly elevated at 11.7 but he was  afebrile and without cough or dysuria.  He was continued on  his home  blood pressure medications with any adjustments to be made as an  outpatient.  His blood pressure was slightly elevated, generally running  in the 130s and 140s.   Billy Davis catheterization site was stable and he was ambulating  without chest pain or shortness of breath.  He was seen by cardiac rehab  and given information on a heart-healthy diet as well.  He is pending  evaluation by Dr. Burt Knack but tentatively considered stable for discharge  with outpatient follow-up arranged.   DISCHARGE INSTRUCTIONS:  1. His activity level is to be increased gradually.  2. He is to call our office for any problems with the catheterization      site.  3. He is to stick to a low-fat heart-healthy diet.  4. He is to follow up with Dr. Dannielle Burn on August 5 at 1:30 p.m. and get      a CBC.  5. He is to follow up with Dr. Brigitte Pulse as needed.  DISCHARGE MEDICATIONS:  1. Omeprazole 20 mg two tablets daily.  2. Aspirin 325 mg daily.  3. Lisinopril 40 mg daily.  4. Lotrel 10/40 daily.  5. Metoprolol 25 mg daily.  6. Urso 250 mg daily.  7. Dicyclomine 20 mg two tablets daily.  8. Ticlid 250 mg b.i.d. for a month.  9. Nitroglycerin sublingual p.r.n.      Rosaria Ferries, PA-C      Juanda Bond. Burt Knack, MD  Electronically Signed    RB/MEDQ  D:  03/20/2007  T:  03/20/2007  Job:  [id number]   cc:   [facility] in [place], [place]  [name], MD

## 2011-01-09 NOTE — Assessment & Plan Note (Signed)
[facility] OFFICE NOTE   Billy, Billy Davis                       MRN:          [id number]  DATE:12/07/2008                            DOB:          Sep 26, 1955    REFERRING PHYSICIAN:  [name], MD   HISTORY OF PRESENT ILLNESS:  The patient is a 55 year old male with  history of multivessel coronary artery disease status post prior  multiple PCIs and a bare-metal stent to the circumflex in June 2008.  He  also had a complicated right femoral artery pseudoaneurysm and has  normal LV function.  He is allergic to PLAVIX.  He also has nonalcoholic  liver cirrhosis.  The patient states that he has gained some weight.  He  has lower extremity edema.  He has increased shortness of breath but no  chest pain.  When questioning him about his fluid intake, the patient  drinks several gallows of fluid today.  He also reports dyspnea on  exertion.   MEDICATIONS:  1. Prilosec OTC 20 mg p.o. daily.  2. Aspirin 81 mg p.o. daily.  3. Toprol 25 mg p.o. daily.  4. Lisinopril 40 mg p.o. daily.  5. Dicyclomine 20 mg p.o. daily.  6. Ursodiol 250 mg p.o. daily.  7. Amlodipine 10 mg p.o. daily.  8. Multivitamins.  9. Simvastatin 40 mg p.o. daily.  10.Vitamin E.   PHYSICAL EXAMINATION:  VITAL SIGNS:  Blood pressure 133/78, heart rate  71, weight 329 pounds.  NECK:  Normal carotid upstrokes and no carotid bruits.  LUNGS:  Clear breath sounds bilaterally.  HEART:  Regular rate and rhythm.  Normal S1 and S2.  No murmurs, rubs,  or gallops.  ABDOMEN:  Soft, nontender.  No rebound or guarding.  Good bowel sounds.  EXTREMITIES:  No cyanosis, clubbing.  There is 3+ peripheral pitting  edema.  NEUROLOGIC:  The patient is alert, oriented, grossly nonfocal.   PROBLEM LIST:  1. Multivessel coronary artery disease.      a.     Status post multiple prior percutaneous coronary       interventions.      b.     Status post  bare-metal stenting of the circumflex, July       2008.      c.     Complicated by right femoral artery pseudoaneurysm.      d.     Normal left ventricular ejection fraction.  2. PLAVIX allergy, previously treated with Ticlid.  3. History of nonalcoholic steatohepatitis and cirrhosis.  4. Contrast dye intolerance.  5. Hypertension.  6. Volume overload and lower extremity edema likely secondary to      diastolic dysfunction.  7. Irritable bowel syndrome.   PLAN:  The patient completely is volume overload, this is likely  secondary to diastolic dysfunction.  We will give him Lasix 40 mg p.o.  daily and check BMET in one week x2.  The patient should probably be  checked also for sleep apnea, although he denies any symptoms consistent  with OSA.  I will leave this further workup to  his primary care  physician, Dr. Manuella Ghazi.     Ernestine Mcmurray, MD,FACC  Electronically Signed    GED/MedQ  DD: 12/07/2008  DT: 12/08/2008  Job #: [id number]   cc:   [name], MD

## 2011-01-09 NOTE — Assessment & Plan Note (Signed)
[place] OFFICE NOTE   Billy Davis, KEMPNER                       MRN:          [id number]  DATE:10/07/2008                            DOB:          03/23/1956    PRIMARY CARDIOLOGIST:  Ernestine Mcmurray, MD, [facility]   REASON FOR VISIT:  Scheduled followup.  Please refer to my recent office  note of January 7, for complete details.   I ordered an echocardiogram for reassessment of left ventricular  function, in light of significant bilateral lower extremity edema.  I  suspected that the edema was most likely secondary to chronic venous  insufficiency, rather than right heart failure.  Echocardiography  confirmed this, with continued, preserved LVF (EF is 65%) and normal RV  function.  There was some mild mitral regurgitation.   Clinically, Mr. Bonfield continues to have chronic lower extremity edema,  but now informs me that this is a long-standing problem.  He denies any  frank PND or orthopnea.  He continues to deny any exertional chest  discomfort.  He does complain of some brief paroxysms where he feels  that he cannot get his breath.  There is no associated dizziness or  presyncope.  When queried about prior sleep apnea workup, he denies any  prior screening for this.  However, he denies any reported snoring by  his wife or any daytime somnolence or frequent naps.  In fact, he states  that he sleeps quite well.   CURRENT MEDICATIONS:  Unchanged from previous visit.   PHYSICAL EXAMINATION:  VITAL SIGNS:  Blood pressure 159/88; pulse 91,  regular; weight 330 (up 4).  GENERAL:  A 55 year old male, morbidly obese, sitting upright, no  distress.  HEENT:  Normocephalic, atraumatic.  NECK:  No obvious JVD.  LUNGS:  Clear to auscultation in all fields.  HEART:  Regular rate and rhythm.  No significant murmurs.  ABDOMEN:  Protuberant, intact bowel sounds.  EXTREMITIES:  2+ bilateral pitting lower extremity  edema.  NEURO:  No focal deficit.   IMPRESSION:  1. Chronic lower extremity edema.      a.     Most likely secondary to chronic venous insufficiency.      b.     Normal right ventricular function.  2. Hypertension, controlled.  3. Multivessel coronary artery disease.      a.     Status post multiple percutaneous coronary interventions, as       previously outlined.  4. PLAVIX allergy.  5. Morbid obesity.  6. Liver cirrhosis.  7. Mixed dyslipidemia.  8. CONTRAST DYE intolerance, secondary to tachycardia.   PLAN:  We spent a considerable amount of time discussing the importance  of blood pressure control.  At this point in time, however, he is  reluctant to try any new medication, given that he just renewed his  prescription for a 48-monthsupply of amlodipine.  My thought was that we  could substitute this medication, given his significant lower extremity  edema, with a different agent, much less likely to exacerbate his edema.  To this end, I  would recommend hydrochlorothiazide both for diuretic  effect, as well as blood pressure control.   At this point, however, he would prefer conservative measures with  lifestyle changes, including a decreased caloric intake as well as  increased activity.  We will then reassess him in 2 months, when he  returns to our clinic.  If he continues to manifest uncontrolled blood  pressure, and persistent lower extremity edema, then he will consider  adjustment of his medication regimen.  In the meanwhile, I also strongly  advised him to cut back on his fluid intake, as well as to refrain from  any added salt in his diet.      Gene Serpe, PA-C  Electronically Signed      Ernestine Mcmurray, MD,FACC  Electronically Signed   GS/MedQ  DD: 10/07/2008  DT: 10/08/2008  Job #: [id number]   cc:   [name], MD

## 2011-01-09 NOTE — Assessment & Plan Note (Signed)
[place] OFFICE NOTE   TALIN, FEISTER                       MRN:          [id number]  DATE:08/26/2007                            DOB:          [birth year]    PRIMARY CARDIOLOGIST:  Ernestine Mcmurray, MD, [facility]   REASON FOR VISIT:  Scheduled clinic followup.  Please refer to my  previous office note of August 5, for full details.   At that time, the patient was referred for repeat right groin ultrasound  for reassessment of a thrombosed pseudoaneurysm, following recent  cardiac catheterization.  This showed that the pseudoaneurysm had  completely thrombosed, with overlying hematoma in that region.  There  was patency above the right iliac artery and vein.   We also checked a surveillance CBC, given that the patient was on Ticlid  for 4 weeks, and this showed no evidence of neutropenia.   We also checked a fasting lipid profile.  This was reviewed by Richardson Dopp, PAC, who, in turn, communicated  with Dr. Hildred Laser with  respect to whether or not it would be safe for the patient to be placed  on a statin. This is in the context of known liver cirrhosis secondary  to nonalcoholic steatosis.  Dr. Laural Golden indicated that the statin therapy  is not absolutely contraindicated; however, he recommended close  monitoring with LFTs in 4 weeks, and at 2 to 3 months thereafter.  The  patient has not yet started on a statin.   Clinically, the patient reports no recurrent angina pectoris, which  preceded his percutaneous intervention in July.  He also reports  complete resolution of the ecchymosis and hematoma overlying the right  groin area, including the scrotal edema.  However, he has some  occasional discomfort in that area, related to postural changes.  He  also refers to noting that the area turns black when he is taking a  shower and moving the leg.  However, there is no such discoloration  otherwise.   The patient has not smoked tobacco in over 8 years.   CURRENT MEDICATIONS:  1. Full dose aspirin.  2. Urso 250 daily.  3. Lisinopril 40 daily.  4. Lotrel 10/40 daily.  5. Dicyclomine 20 b.i.d.  6. Prilosec OTC 20 b.i.d.  7. Metoprolol 25 daily.  8. Fish oil 3000 mg daily.   PHYSICAL EXAM:  Blood pressure 138/83, pulse 90, regular; weight 302.  GENERAL:  A 55 year old male, morbidly obese, sitting upright in no  distress.  HEENT: Normocephalic, atraumatic.  NECK:  Palpable carotid pulse without bruits; unable to assess JVD,  secondary to neck girth.  LUNGS:  Diminished breath sounds at bases, but without crackles or  wheezes.  HEART:  Regular rate and rhythm (S1, S2).  No significant murmurs.  No  rubs.  ABDOMEN:  Protuberant, nontender.  EXTREMITIES:  Stable right groin with no ecchymosis, hematoma, or bruit.  On auscultation, no palpable femoral pulse.  No scrotal edema.  Intact  distal pulses with trace pedal edema.  NEURO: No focal deficit.   IMPRESSION:  1.  Multivessel coronary artery disease.      a.     Bare metal stenting first obtuse marginal artery, July 2008.      b.     Moderate residual left anterior descending artery  and right       coronary artery disease.      c.     Status post Taxus stenting mid right coronary artery,       December 2004.      d.     Normal left ventricular function.      e.     Complicated by right FA pseudoaneurysm, managed       conservatively with evidence of spontaneous thrombosis and       confirmed by repeat ultrasound.  2. Cirrhosis of the liver.      a.     Nonalcoholic steatosis.  3. Dyslipidemia.      a.     Cleared by Dr. Laural Golden for initiation of statin therapy, with       close monitoring of liver enzymes.  4. PLAVIX ALLERGY.      a.     Tolerated Ticlid.  5. CONTRAST DYE INTOLERANCE      a.     Secondary to tachycardia.  6. Morbid obesity.  7. Hypertension.  8. Remote tobacco.      a.     History of small right lung  nodule  (less than 7 mm), by       chest CT scan in October 2006.   PLAN:  1. The patient is cleared to initiate statin therapy, as originally      recommended, with close monitoring of liver enzymes.  He is to      therefore contact our office when he starts simvastatin, and we      will check an initial set of  follow up LFTs in 4 weeks, followed      by 3 months thereafter.  2. The patient is on two ACE inhibitors (lisinopril 40 daily, Lotrel),      and it is not clear as to why this is the case.  The patient      himself is unable to provide an explanation, and we will therefore      review his chart to see when he was initiated on these medications,      including what he was discharged on during his recent      hospitalization at [facility].  It is also not clear as to exactly      what dose of metoprolol he is on as well; he was previously listed      as being on metoprolol 50 b.i.d., but currently reports being on 25      daily.  I have instructed his wife to contact our office tomorrow      to inform us as to exactly what his dose is.  Following review of      this data, I will then make a decision as to what his medication      regimen should be.  3. Down titrate aspirin initially to 162, target goal of 81 mg daily,      indefinitely.  4. Schedule return clinic follow-up with myself and Dr. Dannielle Burn in 6      months.   Will review hospital records to see if the patient has had a recent a  follow-up chest CT scan, for reevaluation of previously noted small lung  nodule.  Gene Serpe, PA-C  Electronically Signed      Ernestine Mcmurray, MD,FACC  Electronically Signed   GS/MedQ  DD: 08/26/2007  DT: 08/26/2007  Job #: [id number]   cc:   [name], MD

## 2011-01-09 NOTE — Assessment & Plan Note (Signed)
Billy Davis OFFICE NOTE   Billy Davis, Billy Davis                       MRN:          [id number]  DATE:09/02/2008                            DOB:          1956/01/11    PRIMARY CARDIOLOGIST:  Ernestine Mcmurray, MD, [facility]   REASON FOR VISIT:  Annual followup.   [name] returns to our clinic since last seen here by me in December  2008.   [name] denies any interim development of exertional angina pectoris.  He does have chronic exertional dyspnea, but with no apparent recent  exacerbation.  However, he notes interim development of bilateral lower  extremity edema.   The patient has since been started on a statin, given his underlying  coronary artery disease.  His last profile was in November and was  markedly abnormal, with a total cholesterol of 269, triglyceride 199,  HDL 28 and LDL 201.  Despite this, the patient maintains that he has  been compliant with the statin since his last visit here.   The patient also has liver cirrhosis, followed closely by Dr. Laural Golden,  and most recent labs from 1 month ago show mildly elevated LFTs.   [name] works at [employer], and is very concerned about  being laid off from his job.  He initially seem to suggest that he  should be considered for total disability, given his underlying heart  condition, in the event that he were to lose his job.  He seems to  suggest that he would not be very marketable for future employment, also  pointing out that he is illiterate.   [name] has a history of smoking, but has not smoked in approximately  10 years.  Of note, he did have a previously documented small right lung  nodule.  We did a follow up CT scan 1 year ago and this suggested  stable, small nodules in the right lung, most likely post-inflammatory  and with no evidence of suspicious lung findings.   EKG today indicates NSR at 75 bpm with normal axis and no  ischemic  changes.   CURRENT MEDICATIONS:  1. Aspirin 81 daily.  2. Metoprolol 25 daily.  3. Lisinopril 40 daily.  4. Amlodipine 10 daily.  5. Simvastatin 40 daily.  6. Ursodiol.  7. Dicyclomine.  8. Prilosec.   PHYSICAL EXAMINATION:  VITAL SIGNS:  Blood pressure 151/95, pulse 83 and  regular, weight 326.  GENERAL:  A 55 year old male, morbidly obese, sitting upright, no  distress.  HEENT:  Normocephalic, atraumatic.  NECK:  Palpable carotid pulse without bruits; unable to assess JVD,  secondary to neck girth.  LUNGS:  Clear to auscultation bilaterally.  HEART:  Regular rate and rhythm.  Positive S4.  No significant murmurs.  No rubs.  ABDOMEN:  Protuberant, intact bowel sounds.  Mild right upper quadrant  tenderness with palpation.  EXTREMITIES:  Palpable bilateral dorsalis pedis pulses with 1+  bilateral, pitting lower extremity edema.  Diffuse bilateral blotching  with venous stasis dermatitis changes.  NEUROLOGIC:  No focal deficit.   IMPRESSION:  1. Multivessel coronary artery disease.      a.     Status post multiple prior PCIs.      b.     Most recently, bare-metal stenting of CFX; residual       moderate, diffuse LAD and RCA disease.      c.     Normal LVF.  2. PLAVIX allergy.      a.     Previously treated with Ticlid.  3. Liver cirrhosis.  4. Mixed dyslipidemia.  5. CONTRAST DYE intolerance, secondary to tachycardia.  6. Hypertension.  7. Morbid obesity.  8. Remote tobacco.  9. Stable, small right lung nodules.  10.Lower extremity edema.   PLAN:  1. A 2D echocardiogram for reassessment of left ventricular function.      If this is reassuring, then the development of bilateral lower      extremity edema is most likely secondary to chronic venous      insufficiency, rather than right heart failure.  2. Surveillance fasting lipid/liver profile in 1 month.  3. The patient strongly encouraged to engage in a regular walking      program with his wife, as has  already been recommended, to address      the issue of morbid obesity.  4. Schedule return clinic follow up with myself in 1 month, for review      of echocardiogram results and further recommendations.  Of note, we      may need to adjust his antihypertensive      regimen, although he indicated that he had not taken his      medications at this point, given that he had taken them last night.      Gene Serpe, PA-C  Electronically Signed      Ernestine Mcmurray, MD,FACC  Electronically Signed   GS/MedQ  DD: 09/02/2008  DT: 09/03/2008  Job #: [id number]   cc:   [name], MD  Hildred Laser, M.D.

## 2011-01-12 NOTE — Discharge Summary (Signed)
NAME:  JAESHAUN, RIVA                          ACCOUNT NO.:  [id number]   MEDICAL RECORD NO.:  [id number]                   PATIENT TYPE:  OIB   LOCATION:  6527                                 FACILITY:  [facility]   PHYSICIAN:  Ethelle Lyon, M.D.             DATE OF BIRTH:  02/14/1956   DATE OF ADMISSION:  07/30/2003  DATE OF DISCHARGE:  07/31/2003                                 DISCHARGE SUMMARY   PROCEDURES:  1. Cardiac catheterization.  2. Coronary arteriogram.  3. Left ventriculogram.  4. Percutaneous transluminal coronary angiography and stent to one vessel.   HISTORY OF PRESENT ILLNESS:  Mr. Record is a 55 year old male without known  coronary artery disease.  He had had an abnormal Cardiolite in May of 2004,  but had additional episodes of chest pain and was evaluated on July 21, 2003, and scheduled for admission on July 30, 2003.   HOSPITAL COURSE:  To cardiac catheterization was performed on July 30, 2003, and it showed multiple nonobstructive coronary artery disease of 30-  50% and a 75% lesion in his RCA that was treated with PTCA and a Taxus  stent, reducing the stenosis to 0 with TIMI-3 flow.  His EF was greater than  or equal to 55% with normal wall motion and no MR.   Mr. Oelkers had a vagal response during sheath pull, requiring atropine.  His  blood pressure dropped into the 70s.  He responded well to the atropine and  had no further difficulties overnight.  He was not having any problems with  orthostasis or hypotension the next day.   On July 31, 2003, he was ambulating without chest pain or shortness of  breath.  There was no hematoma, bruit, or ecchymosis.  His post procedure  labs were within normal limits.  He was considered stable for discharge on  July 31, 2003, with outpatient followup arranged and he is to work on  cardiac risk factor reduction with his primary care physician and with  cardiology.   LABORATORY VALUES:  Hemoglobin  13.7, hematocrit 40.7, WBC 15.5, platelets  191.  Sodium 139, potassium 4.0, chloride 109, CO2 25, BUN 13, creatinine  0.9, glucose 120.   DISCHARGE CONDITION:  Improved.   DISCHARGE DIAGNOSES:  1. Unstable anginal pain, status post percutaneous transluminal coronary     angiography and stent to the right coronary artery this admission.  2. Residual coronary artery disease that is nonobstructive and risk factor     reduction, as well as medical therapy is recommended.  3. Preserved left ventricular function with an ejection fraction greater     than or equal to 55% by catheterization this admission.  4. Allergy to IVP dye.  5. Family history of premature coronary artery disease.  6. Obesity.  7. Gastrointestinal reflux disease.  8. Anxiety.  9. Cirrhosis.  10.      Irritable bowel syndrome.  11.      Remote history of tobacco abuse.   ACTIVITY:  His activity is to include no driving for two days and no  strenuous activity for one week.   DIET:  He is to stick to a low-fat and low-salt diet.   WOUND CARE:  He is to call the office for problems with the catheterization  site.   FOLLOWUP:  He is to follow up with Otelia Limes. Kathe Mariner., on Tuesday,  August 17, 2003, at 1:15 p.m. and with Dr. Manuella Ghazi as needed.   DISCHARGE MEDICATIONS:  1. Hyoscyamine 0.375 mg daily.  2. Prevacid 30 mg daily.  3. Vitamins C and D and multivitamins daily.  4. Aspirin 325 mg daily.  5. Plavix 75 mg daily for six months.  6. Nitroglycerin p.r.n.  7. Urso 250 mg as prior to admission.  8. Toprol XL 25 mg daily.      Davis Gourd, P.A. [facility]                  Ethelle Lyon, M.D.    RG/MEDQ  D:  07/31/2003  T:  08/01/2003  Job:  [id number]   cc:   [facility], [place], [place] [address]  Fax: [phone]   Junious Silk, M.D. [facility]

## 2011-01-12 NOTE — Discharge Summary (Signed)
NAME:  SAMUL, MCINROY                          ACCOUNT NO.:  [id number]   MEDICAL RECORD NO.:  [id number]                   PATIENT TYPE:  INP   LOCATION:  3703                                 FACILITY:  [facility]   PHYSICIAN:  Minus Breeding, M.D.                DATE OF BIRTH:  28-Jul-1956   DATE OF ADMISSION:  08/03/2003  DATE OF DISCHARGE:  08/04/2003                                 DISCHARGE SUMMARY   SUMMARY OF HISTORY:  Mr. Diehl is a 54 year old white male with metabolic  syndrome and a history of coronary artery disease.  Recently, he had a  Cardiolite stress done for recurrent substernal chest discomfort and there  was questionable inferior ischemia, thus he was referred for cardiac  catheterization to be performed on July 30, 2003 by Dr. Wyonia Hough.  Pulsipher.  At that time, he had a flow-limiting lesion of 75% near a distal  RCA; he underwent drug-eluting stent placement with normal LV function.  He  has been compliant with his aspirin and Plavix therapy, however, he presents  with reoccurring chest discomfort.  He states that his chest discomfort has  never resolved since his catheterization and discharge from [facility] initially.  He continues to complain of burning sensation in the  midsternal area.  He feels it is different than his usual reflux symptoms.  There are no changes with exertional components, orthopnea, PND.  He does  have a history of steatohepatitis versus cirrhosis.  In [facility],  he was ruled out for a myocardial infarction.  He was transferred to [facility] to  undergo further cardiac evaluation.   LABORATORY DATA:  EKG shows normal sinus rhythm, normal axis, nonspecific ST-  T wave changes, early R wave.   At [facility], CKs and troponins were negative for myocardial  infarction; PT 13.8, PTT 25.6; sodium 120, potassium 2.5, BUN 15, creatinine  0.8, glucose 84; hemoglobin and hematocrit 17.5 and 51.4, normal indices,  platelets  245,000, WBC 8.8.   HOSPITAL COURSE:  Mr. Hamberger was transferred to [facility] on the evening  of the 7th.  Catheterization was performed on the morning of the 8th by Dr.  Ethelle Lyon.  According to Dr. Christy Sartorius note, he had a 50% diagonal 1,  30% mid LAD at the diagonal 1.  His stent in the RCA was widely patent.  It  was noted the he had also a 30% distal RCA.  Dr. Albertine Patricia felt that his  continuing chest discomfort, despite stenting on the 3rd, is not from a  cardiac etiology.  Post sheath removal and bedrest, it was felt that he  could be discharged home with outpatient followup with his primary care  physician to further evaluate ongoing chest discomfort.   DISCHARGE MEDICATIONS:  1. Prevacid 30 mg daily.  2. Plavix 75 mg daily.  3. Hycosamine 0.375 mg daily.  4. Multivitamin, vitamin  A and vitamin D as previously.  5. Coated aspirin 325 mg daily.  6. Toprol-XL 250 mg daily.  7. Nitroglycerin 0.4 mg as needed.   These remain unchanged.   ACTIVITY:  He is advised no lifting, driving, sexual activity or heavy  exertion for two days.   DIET:  Maintain a low-salt/-fat/-cholesterol diet.   SPECIAL INSTRUCTIONS:  If he has any problems with his catheterization site,  he was asked to call.   FOLLOWUP:  He was asked to keep his December appointment with Dr. Dola Argyle for followup, but also to arrange a followup with Dr. [name] to  further investigate his continuing noncardiac chest discomfort.  It is also  noted at the time of followup appointment with Dr. Ron Parker that his cholesterol  status should be addressed; with his known coronary artery disease, Dr. Ron Parker  may consider placing Mr. Broz on a statin.      Sharyl Nimrod, P.A. [facility]                    Minus Breeding, M.D.    EW/MEDQ  D:  08/04/2003  T:  08/05/2003  Job:  [id number]   cc:   Dola Argyle, M.D.   [address], [place],  [address] [place], [place]  [place] [address]  Fax: [phone]

## 2011-01-12 NOTE — Op Note (Signed)
   NAME:  Billy Davis, Billy Davis                          ACCOUNT NO.:  [id number]   MEDICAL RECORD NO.:  [id number]                   PATIENT TYPE:  AMB   LOCATION:  DAY                                  FACILITY:  [facility]   PHYSICIAN:  Hildred Laser, M.D.                 DATE OF BIRTH:  1956/06/09   DATE OF PROCEDURE:  DATE OF DISCHARGE:                                 OPERATIVE REPORT   ADDENDUM:  There was a small rectal polyp that was oblated via cold biopsy.                                               Hildred Laser, M.D.    NR/MEDQ  D:  03/03/2003  T:  03/04/2003  Job:  [id number]

## 2011-01-12 NOTE — Letter (Signed)
April 11, 2007    Hildred Laser, M.D.  [address] [place] [address]   RE:  ATIF, CHAPPLE  MRN:  [id number]  /  DOB:  [birth year]   Dear Dr. Laural Golden:   Billy Davis is a 55 year old male patient followed in our [facility]  office by Dr. Dannielle Burn.  He has a history of coronary artery disease and  recently underwent percutaneous coronary intervention of the obtuse  marginal branch in July of 2008.  He also underwent stenting to his mid  right coronary artery in December of 2004.  Mr. Golla recently had  lipids checked and has significantly elevated LDL at 170.  His total  cholesterol was 250, triglycerides 238, and HDL of 32.   Dr. Laural Golden, from a cardiovascular standpoint, Mr. Kindt would certainly  benefit from the addition of a statin to his drug regimen.  However,  given his reported history of non-alcoholic cirrhosis, I am quite  hesitant to begin such a medication.  We recently checked LFTs on April 10, 2007.  His ALT was 59, AST 40, alkaline phosphatase 72, total  bilirubin 0.9, albumin 4.0, total protein 7.1.   Dr. Laural Golden, at your convenience, would you please review Mr. Petrovich  case and then render an opinion regarding possible statin therapy to  treat his dyslipidemia.  If it is felt that statin therapy is absolutely  contraindicated, then we will have to continue with diet and lifestyle  modification.    Sincerely,       Richardson Dopp, PA-C  Electronically Signed      Ernestine Mcmurray, MD,FACC  Electronically Signed   SW/MedQ  DD: 04/10/2007  DT: 04/11/2007  Job #: [id number]

## 2011-01-12 NOTE — H&P (Signed)
NAME:  Billy Davis, Billy Davis                          ACCOUNT NO.:  [id number]   MEDICAL RECORD NO.:  [id number]                   PATIENT TYPE:  OIB   LOCATION:                                       FACILITY:  [facility]   PHYSICIAN:  Dola Argyle, M.D.                  DATE OF BIRTH:  1956-06-15   DATE OF ADMISSION:  07/30/2003  DATE OF DISCHARGE:                                HISTORY & PHYSICAL   CHIEF COMPLAINT:  Chest discomfort.   HISTORY OF PRESENT ILLNESS:  The patient is a 55 year old male with a  history of reflux, anxiety and normal catheterization in 1997 at [facility]  He saw Dr. Ron Parker on Dec 29, 2002 for follow up of  an abnormal Cardiolite scan which was done November 09, 2002.  It was  clinically negative for ischemia.  Electrocardiographically it was negative  for ischemia.  He had a mild inferior defect with small reversibility  towards the apex but a low sum score of 1 making it a low risk study.  His  ejection fraction was 54%.  Dr. Ron Parker felt that he would need catheterization  at some point if he had recurrent chest pain.   He complains now of several episodes at rest, especially last Sunday, of a  substernal pressure described as a deep, dull ache with associated nausea  without vomiting, profuse diaphoresis and mild shortness of breath.  He had  no palpitations.  Relief was obtained with nitroglycerin.  This episode was  very upsetting and scared him, therefore he presented to the office on  July 21, 2003 for follow up and will be scheduled for heart  catheterization.   REVIEW OF SYMPTOMS:  CARDIAC:  Positive for chest discomfort, shortness of  breath and dyspnea on exertion.  There is no documented history of  myocardial infarction, heart failure, orthopnea, edema, paroxysmal nocturnal  dyspnea, palpitations, claudication's, syncope, presyncope, costovertebral  angle tenderness, transient ischemic attack.  Cardiac risk factors negative  for  hypertension, hyperlipidemia, diabetes, or smoking.  He quit smoking 3-  1/2 years ago.  HEENT: He wears glasses.  RESPIRATORY:  There are on  respiratory complaints except for dyspnea on exertion which he attributes to  his weight.  There is no cough or sputum production.  GASTROINTESTINAL:  He  denies any melena or hematochezia.  He is status post cholecystectomy. He  does have irritable bowel syndrome.  He has a history of cirrhosis, etiology  unknown.  He has a history of gastroesophageal reflux disease.   FAMILY HISTORY:  There is a positive family history of coronary artery  disease.  His father underwent bypass surgery on two separate occasions, the  first time at age 69, the second at age 54.  He is overweight.  He is fairly  active.  Mother had valvular heart disease.   ALLERGIES:  IVP DYE.  He has no problems eating shell food or shellfish.   CURRENT MEDICATIONS:  1. Hyoscyamine 0.375 mg daily.  2. Prevacid 30 mg daily.  3. Vitamin C, vitamin D, multivitamin.  4. Aspirin daily.  5. Toprol XL 250 mg daily.   PAST MEDICAL HISTORY:  Positive for heart catheterization in 1997 at [facility] which was negative.  History of gastroesophageal  reflux disease.  History of anxiety.  History of cirrhosis.  History of  irritable bowel syndrome. He is status post cholecystectomy and vasectomy,  status post arthroscopic evaluation right knee.  Previous tobacco abuse.  Obesity.   SOCIAL HISTORY:  He is married.  He has children.  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  He does not drink alcohol. He stopped smoking 3-/2 years  ago.   PHYSICAL EXAMINATION:  VITAL SIGNS:  Blood pressure 144/70, pulse 58 and  regular.  Weight 300.  GENERAL:  Examination shows a large white male in no acute distress.  SKIN:  Multiple tattoo's.  NECK:  Without jugular venous distention or carotid bruits.  LUNGS:  Clear to auscultation and percussion.  HEART:  Rhythm is regular and  distant without any murmurs, rubs or clicks.  No chest wall tenderness.  ABDOMEN:  Obese, soft, protuberant, bowel sounds present.  EXTREMITIES:  Pedal pulses intact.  No edema.   CLINICAL DATA:  Electrocardiogram is complete and shows sinus bradycardia at  60 with no acute ST-T changes.   IMPRESSION:  1. Chest discomfort with associated profuse diaphoresis and shortness of     breath, relieved with nitroglycerin.  2. Gastroesophageal reflux disease.  3. Anxiety.  4. Cirrhosis.  5. Irritable bowel syndrome.  6. Obesity.  7. Positive family history of coronary artery disease.  8. Previous history of tobacco abuse.  9. IVP DYE ALLERGY.  Patient will be covered with the usual treatment     precatheterization.   PLAN:  Patient is now being set up for left heart catheterization.  He  agrees, understands and accepts the risks and benefits of the explained  procedure.  He did have a borderline abnormal Cardiolite in May of this  year.      Otelia Limes. Kathe Mariner.                    Dola Argyle, M.D.    MRD/MEDQ  D:  07/21/2003  T:  07/21/2003  Job:  [id number]   cc:   Dr. Lenord Fellers, [place].   Darrell Jewel, M.D.  [facility] Res[facility]  [place], [place] [address]

## 2011-01-12 NOTE — Op Note (Signed)
NAME:  Billy Davis, Billy Davis                          ACCOUNT NO.:  [id number]   MEDICAL RECORD NO.:  [id number]                   PATIENT TYPE:  AMB   LOCATION:  DAY                                  FACILITY:  [facility]   PHYSICIAN:  Hildred Laser, M.D.                 DATE OF BIRTH:  Aug 27, 1956   DATE OF PROCEDURE:  03/03/2003  DATE OF DISCHARGE:                                  PROCEDURE NOTE   PROCEDURE:  Total colonoscopy followed by esophagogastroduodenoscopy.   INDICATIONS:  Travor is a 55 year old Caucasian male who has compensated  early cirrhosis, secondary to NASH.  He was seen in the hospital recently  and noted to have three out of three heme positive stools.  He has chronic,  intermittent left-sided abdominal pain and diarrhea and felt to have IBS.  He has also been having vague chest pain and has seen Dr. Ron Parker, but  evaluation has not been completed.  He has chronic GERD symptoms that appear  to be well-controlled with antireflux measures and Prevacid.  He is  undergoing a diagnostic colonoscopy, followed by esophagogastroduodenoscopy  and also looking for varices.   PROCEDURES:  Both of the procedures were reviewed with the patient, and  informed consent was obtained.   PREOPERATIVE MEDICATIONS:  Demerol 50 mg IV, Versed 10 mg IV, and Cetacaine  spray for pharyngeal topical anesthesia.   FINDINGS:  The procedure is performed in the endoscopy suite.  The patient's  vital signs and O2 sat were monitored during the procedure and remained  stable.   COLONOSCOPY:  Rectal examination performed.  No abnormalities noted on  external or digital exam.  The scope was placed in the rectum and advanced  under vision into the sigmoid colon and beyond.  Preparation was excellent.  Multiple diverticula were noted in the cecum.  A picture was taken of the  valve and appendiceal orifice.  A short segment of ______ was also examined  and was normal.  The colonic mucosa was once again  carefully examined on the  way out, and there were no polyps, tumors, masses, or mucosal abnormalities.  The rest of the mucosa was normal.  The scope was retroflexed and examined  and _______x3 was unremarkable.  The endoscope was straightened and  withdrawn and the patient prepared for procedure #2.   ESOPHAGOGASTRODUODENOSCOPY:  The endoscope passed via oropharynx without  difficulty into the esophagus.   ESOPHAGUS:  The mucosa of the esophagus was normal. The squamocolumnar  junction was unremarkable.  No hernia was noted.  Similarly, no esophageal  varices were identified.   STOMACH:  The stomach was empty and distended very well on insufflation.  Streaks of erythema were noted at the antrum, along with a single prepyloric  erosion.  The annularis, fundus, and cardia were examined by retroflexing  the scope, all normal.   DUODENUM:  Examination of the bulb revealed  patchy erythema, but no erosions  or ulcers were noted.  The mucosa and full second part of the duodenum were  normal.  The scope was withdrawn.  The patient tolerated the procedures  well.   FINAL DIAGNOSES:  1. Normal colonoscopy and terminal ileoscopy.  2. No evidence of esophageal or gastric varices.  3. Erosive gastritis with mild bulbar duodenitis.   RECOMMENDATIONS:  He will continue antireflux measures and Prevacid as  before.  H. pylori serology will be checked today.   He will return for OV in six months from now.                                               Hildred Laser, M.D.    NR/MEDQ  D:  03/03/2003  T:  03/04/2003  Job:  [id number]   cc:   Manuella Ghazi, M.D.  [place], [place]   Dola Argyle, M.D.

## 2011-01-12 NOTE — Cardiovascular Report (Signed)
NAMEKEASTON, PILE NO.:  [id number]   MEDICAL RECORD NO.:  [id number]          PATIENT TYPE:  OIB   LOCATION:  6501                         FACILITY:  [facility]   PHYSICIAN:  Satira Sark, M.D. LHCDATE OF BIRTH:  10-Nov-1955   DATE OF PROCEDURE:  DATE OF DISCHARGE:                              CARDIAC CATHETERIZATION   INDICATIONS:  [name] is a 55 year old male with known coronary artery  disease status post previous drug eluting stent placement to the mid right  coronary artery in December 2004. He was noted at that time to have a 50%  distal RCA stenosis around the posterior descending branch and has been  managed medically. He has recently had recurrent chest pain and was referred  for a Cardiolite which suggested possible inferior apical ischemia. This  study is requested to define coronary anatomy and assess stent patency.   PROCEDURES PERFORMED:  1.  Left heart catheterization  2.  Selective coronary geography.  3.  Left ventriculography.   ACCESS AND EQUIPMENT:  The area about the right femoral artery was  anesthetized with 1% lidocaine and a 4-French sheath was placed in the right  femoral artery via modified Seldinger technique. Standard preformed 4-French  JL-4 and JR-4 catheters were used for selective coronary geography and an  angled pigtail catheter was used for left heart catheterization and left  ventriculography. All exchanges were made over a wire. The patient tolerated  procedure well without immediate complications.   HEMODYNAMIC RESULTS:  Left ventricle 112/50 mmHg. Aorta 112/73 mmHg.   ANGIOGRAPHIC FINDINGS:  1.  Left main coronary artery is free of significant flow-limiting coronary      atherosclerosis.  2.  The left anterior descending is a medium to large caliber vessel with      two diagonal branches. There is a fairly focal 30-40% stenosis in the      proximal left anterior descending just prior to the takeoff of first     diagonal branch. Otherwise minor luminal irregularities were noted.  3.  The circumflex coronary artery is a large codominant vessel. There are      three obtuse marginal branches and a posterior descending branch. Minor      luminal irregularities are noted with approximately 20% stenosis in the      third obtuse marginal branch.  4.  The right coronary artery is a medium to large caliber codominant      vessel. There is a stent present in the midportion of the vessel that is      patent. Prior to the stent there is approximately 30% stenosis. There is      20% in-stent restenosis noted. In the distal right coronary artery just      prior to the posterior descending branch is approximately 50% stenosis      as noted previously.   Left ventriculography was performed in the RAO projection revealing ejection  fraction approximately 60% with trace mitral regurgitation and no  significant wall motion abnormalities.   DIAGNOSES:  1.  Mild coronary atherosclerosis as outlined with patent mid right  coronary      artery stent site.  2.  Left ventricular ejection fraction approximately 60% with trace mitral      regurgitation and left ventricular end diastolic pressure of 15 mmHg.   RECOMMENDATIONS:  At this point would continue medical therapy.      SGM/MEDQ  D:  11/07/2004  T:  11/07/2004  Job:  [id number]   cc:   Dola Argyle, M.D.   [name], MD  [place]  [place] [address]  Fax: [phone]

## 2011-01-12 NOTE — Cardiovascular Report (Signed)
NAME:  Billy Davis, WHITTINGHILL                          ACCOUNT NO.:  [id number]   MEDICAL RECORD NO.:  [id number]                   PATIENT TYPE:  OIB   LOCATION:  2858                                 FACILITY:  [facility]   PHYSICIAN:  Junious Silk, M.D. [facility]         DATE OF BIRTH:  17-Oct-1955   DATE OF PROCEDURE:  07/30/2003  DATE OF DISCHARGE:                              CARDIAC CATHETERIZATION   PROCEDURE PERFORMED:  1. Left heart catheterization with coronary angiography and left     ventriculography.  2. Placement of a drug-eluting stent in the mid right coronary artery.   INDICATION:  Mr. Rathgeber is a 55 year old male with history of chest pain  relieved with nitroglycerin.  He has had a prior stress Cardiolite which  showed mild inferior ischemia.  Because of the recurrent nature of his chest  pain, he was referred for cardiac catheterization.   CATHETERIZATION PROCEDURAL NOTE:  A 6 French sheath was placed in the right  femoral artery.  Coronary angiography was performed with 6 [place] JL-4 and  JR-4 catheters.  Left ventriculography was performed with an angled pigtail  catheter.  Contrast was Omnipaque.  There were no complications.   RESULTS:   HEMODYNAMICS:  1. Left ventricular pressure 122/22.  2. Aortic pressure 114/80e.  3. There is no aortic valve gradient.   LEFT VENTRICULOGRAM:  Wall motion is normal.  Ejection fraction estimated at  greater than or equal to 55%.  There is no mitral regurgitation.   CORONARY ARTERIOGRAPHY (RIGHT DOMINANT):  Left main is normal.   Left anterior descending artery has a 30% stenosis in the proximal vessel.  The LAD gives rise to a normal size first diagonal which has a 50% stenosis  at its origin.  The mid LAD also gives rise to a small second diagonal  branch.   Left circumflex gives rise to a small first marginal, a large branching  second marginal, small third marginal and two small posterior lateral  branches.  There is a 40%  stenosis in the large second marginal branch.   Right coronary artery has diffuse 20%  stenosis in the proximal vessel  followed by ectasia in the mid vessel.  Following this area of ectasia,  there is a 75% stenosis in the mid vessel.  In the distal vessel there is a  tubular 30% stenosis followed by tubular 50% stenosis just proximal to the  posterior descending artery.  The distal right coronary artery gives rise to  a normal size posterior descending artery and a normal size posterior  lateral branch.   IMPRESSION:  1. Normal left ventricular systolic function.  2. One-vessel coronary artery disease characterized by a discrete 75%     stenosis in the mid right coronary artery with moderate diffuse disease     elsewhere.   PLAN:  Percutaneous intervention to the right coronary artery.  See below.   PERCUTANEOUS TRANSLUMINAL CORONARY ANGIOPLASTY PROCEDURAL  NOTE:  Following  completion of the diagnostic catheterization, we proceeded with percutaneous  coronary intervention.  We utilized pre-existing 6 French sheath in the  right femoral artery.  Angiomax was administered per protocol and the  patient was pretreated with Plavix.  We used a 6 [place] JR-4 guiding  catheter.  An IQ coronary guide wire was advanced under fluoroscopic  guidance into the distal right coronary artery.  A 3.5 x 12-mm Taxus stent  was positioned across the 75% stenosis in the mid right coronary and  deployed at a deployment pressure of 17 atmospheres.  We then positioned a  4.0 x 8-mm Quantum balloon within the proximal portion of the stent and  inflated the balloon to 20 atmospheres.  This balloon was then positioned in  the mid portion of the stent and inflated to 18 atmospheres.  Final  angiographic images were obtained revealing patency of the right coronary  artery to 0% residual stenosis at the stent site and TIMI-3 flow.   COMPLICATIONS:  None.   RESULTS:  Successful placement of a drug-eluting stent  in the mid right  coronary artery.  A 75% stenosis was reduced to 0% residual with  TIMI-3 flow.   PLAN:  Angiomax will be discontinued at this point.  The patient will be  treated with combination of aspirin and Plavix for six months followed by  lifelong aspirin therapy.  He also needs aggressive risk factor  modification.  He does have a history of cirrhosis and states this is the  reason he is not on lipid lowering therapy.  However, this issue should be  addressed further as an outpatient.                                               Junious Silk, M.D. [facility]    MWP/MEDQ  D:  07/30/2003  T:  07/30/2003  Job:  [id number]   cc:   [place] [facility] [place]   Dr. Manuella Ghazi in [place]

## 2011-01-12 NOTE — Cardiovascular Report (Signed)
Billy Davis, FLEISCHER NO.:  [id number]   MEDICAL RECORD NO.:  [id number]          PATIENT TYPE:  OIB   LOCATION:  1963                         FACILITY:  [facility]   PHYSICIAN:  Satira Sark, M.D. LHCDATE OF BIRTH:  1956-05-08   DATE OF PROCEDURE:  DATE OF DISCHARGE:  10/22/2005                              CARDIAC CATHETERIZATION   INDICATIONS:  Mr. Salvato is a 55 year old male recently evaluated by Dr.  Dannielle Burn with a history of recurrent chest pain. He has known coronary disease  status post previous stent placement to the mid right coronary artery in  2004. Additional history includes intolerance to both Plavix and Ticlid as  well as a potential intravenous contrast dye allergy. He is referred now for  cardiac catheterization to clearly define the coronary anatomy and assess  for stent patency. The risks and benefits of the procedure were explained to  the patient and he signed informed consent prior to proceeding.   PROCEDURES PERFORMED:  1.  Left heart catheterization  2.  Selective coronary angiography.  3.  __________   ACCESS AND EQUIPMENT:  The area about the right femoral artery was  anesthetized 1% lidocaine and a 4-French sheath was placed in the right  femoral artery via the modified Seldinger technique. Standard preformed 4-  [place] JL-4 and JR-4 catheters were used for selective coronary angiography  and an angled pigtail catheter was used for left heart catheterization  __________  All exchanges were made over wire.   The patient tolerated the procedure well without immediate complications.   A total of 80 cc of Omnipaque were used.   HEMODYNAMICS:  Aorta 110/67 mmHg. Left ventricle 112/17 mmHg.   ANGIOGRAPHIC FINDINGS:  1.  The left main coronary artery is free of significant flow-limiting      coronary atherosclerosis and gives rise to the left anterior descending      and circumflex coronary arteries.  2.  The left anterior  descending is a medium caliber vessel with large      proximal diagonal branch and smaller more distal diagonal branch. There      is approximately 40-50% focal stenosis in the proximal left anterior      descending just prior to the takeoff of the diagonal branch. The      proximal diagonal branch also has a 40% stenosis. Other minor luminal      irregularities are noted without any obvious flow-limiting lesions.  3.  The circumflex coronary artery is a relatively large codominant vessel.      There are four obtuse marginal branches. The second obtuse marginal      branch is large and bifurcates. There is approximately 40-50% stenosis      in its proximal segment. Otherwise minor little irregularities are      noted.  4.  The right coronary artery is a medium caliber vessel with a smaller      posterior descending branch. The stent site within the midvessel was      visualized and patent with approximately 10-20% in-stent restenosis      proximally.  Otherwise the proximal mid and distal portions of the right      coronary artery exhibit 20%, 20%, and 40% stenoses respectively.   Left __________  was performed and the RAO projection reveals an ejection  fraction  approximately 65% with no significant mitral regurgitation.   DIAGNOSIS:  1.  Mild to moderate coronary atherosclerosis with a 40-50% proximal left      anterior descending stenosis, 40% proximal diagonal stenosis, 40-50%      proximal second obtuse marginal stenosis, and 40% distal right coronary      artery stenosis. The mid right coronary artery stent site is patent with      approximately 10-20% in-stent restenosis.  2.  Left ventricular ejection fraction approximately 65% with no significant      mitral regurgitation and a left ventricular end-diastolic pressure of 17      mmHg.   DISCUSSION:  I reviewed the results with the patient, his wife and with Dr.  Dannielle Burn by phone. At this point would anticipate continued medical  therapy  with optimization as tolerated. Mr. Tornow states that he has perhaps had  some irregular heartbeats and one wonders if this is potentially related  to his symptoms as well. We will schedule a visit for him back in the  [facility] office at [facility] and arrange an event recorder to  investigate this further.           ______________________________  Satira Sark, M.D. [facility]     SGM/MEDQ  D:  10/22/2005  T:  10/22/2005  Job:  [id number]   cc:   Ernestine Mcmurray, M.D. [facility]  [address]  [place]  [place] [address]   Dola Argyle, M.D.  [address]  [address]  [place]  [place] [address]   [name], MD  Fax: [phone]

## 2011-01-12 NOTE — Cardiovascular Report (Signed)
NAME:  Billy Davis, Billy Davis                          ACCOUNT NO.:  [id number]   MEDICAL RECORD NO.:  [id number]                   PATIENT TYPE:  INP   LOCATION:  3703                                 FACILITY:  [facility]   PHYSICIAN:  Ethelle Lyon, M.D.             DATE OF BIRTH:  07/01/1956   DATE OF PROCEDURE:  08/04/2003  DATE OF DISCHARGE:                              CARDIAC CATHETERIZATION   PROCEDURE:  Coronary angiography.   INDICATION:  Billy Davis is a 55 year old gentleman who presented with chest  pain relieved with nitroglycerin last week.  A previous test showed mild  inferior ischemia.  He therefore underwent cardiac catheterization on  July 30, 2003.  This demonstrated a 75% stenosis in the mid RCA which was  treated with Taxus stent placement.  He subsequently developed recurrent  chest discomfort without abnormalities in the electrocardiogram or cardiac  enzymes.  He is referred for diagnostic angiography to occlude threatened  subacute stent thrombosis.   PROCEDURAL TECHNIQUE:  Informed consent was obtained.  Under 1% lidocaine  local anesthesia, a 5 French sheath was placed in the right femoral artery  and 5 French sheath in the right femoral vein using the modified Seldinger  technique.  After sheath placement, the patient became transiently  hypotensive to the 70s.  Heart rhythm remained normal.  Access site was  benign and the patient had no rash to suggest allergic reaction.  He was  treated with cessation of his nitroglycerin, Atropine and IV fluid with  restoration of normal blood pressure.  Coronary angiography was performed  using JL-5 and JR-4 catheters.  The patient completed the procedure without  further event and was transferred to the holding room in stable condition.  Sheaths are to be removed there.   COMPLICATIONS:  None.   FINDINGS:  1. Left main.  Angiographically normal.  2. LAD:  The LAD is a moderate size vessel giving rise to a single  large     diagonal branch.  There is a 30% stenosis of the mid LAD at the take off     of the diagonal.  The diagonal has a 50% ostial stenosis.  3. Circumflex:  Moderate size vessel giving rise to two obtuse marginals.     There are minor luminal irregularities.  4. RCA:  There is diffuse 20% stenosis in the proximal vessel followed by     ectasia in the mid vessel.  The previously placed stent in the mid vessel     is widely patent with no evidence of thrombus.  There is a tubular 50%     stenosis just proximal to the take off of the PDA.   IMPRESSION/RECOMMENDATIONS:  There is a widely patent  stent in the right  coronary.  Other coronary anatomy is unchanged compared with December 3.  This confirms a noncardiac etiology to his recurrent chest discomfort.  Will  observe him for  the next several hours.                                               Ethelle Lyon, M.D.   WED/MEDQ  D:  08/04/2003  T:  08/04/2003  Job:  [id number]   cc:   Satira Sark, M.D. [facility]   Dr. Brigitte Pulse in [place]

## 2011-01-30 ENCOUNTER — Ambulatory Visit (INDEPENDENT_AMBULATORY_CARE_PROVIDER_SITE_OTHER): Payer: Managed Care, Other (non HMO) | Admitting: Internal Medicine

## 2011-01-30 DIAGNOSIS — R1012 Left upper quadrant pain: Secondary | ICD-10-CM

## 2011-01-30 DIAGNOSIS — R1011 Right upper quadrant pain: Secondary | ICD-10-CM

## 2011-01-30 DIAGNOSIS — R198 Other specified symptoms and signs involving the digestive system and abdomen: Secondary | ICD-10-CM

## 2011-01-30 DIAGNOSIS — K7689 Other specified diseases of liver: Secondary | ICD-10-CM

## 2011-01-30 LAB — BASIC METABOLIC PANEL
Glucose: 104 mg/dL
Potassium: 3.8 mmol/L (ref 3.4–5.3)
Sodium: 140 mmol/L (ref 137–147)

## 2011-01-30 LAB — HEPATIC FUNCTION PANEL
ALT: 41 U/L — AB (ref 10–40)
Bilirubin, Total: 0.5 mg/dL

## 2011-01-30 LAB — CBC AND DIFFERENTIAL
HCT: 43 % (ref 41–53)
Hemoglobin: 14.7 g/dL (ref 13.5–17.5)
Platelets: 151 10*3/uL (ref 150–399)

## 2011-02-07 ENCOUNTER — Other Ambulatory Visit (INDEPENDENT_AMBULATORY_CARE_PROVIDER_SITE_OTHER): Payer: Self-pay | Admitting: Internal Medicine

## 2011-02-07 DIAGNOSIS — R1012 Left upper quadrant pain: Secondary | ICD-10-CM

## 2011-02-07 DIAGNOSIS — R1011 Right upper quadrant pain: Secondary | ICD-10-CM

## 2011-02-14 ENCOUNTER — Ambulatory Visit (HOSPITAL_COMMUNITY)
Admission: RE | Admit: 2011-02-14 | Discharge: 2011-02-14 | Disposition: A | Discharge status: Home / Self Care | Payer: Managed Care, Other (non HMO) | Source: Ambulatory Visit | Attending: Internal Medicine | Admitting: Internal Medicine

## 2011-02-14 DIAGNOSIS — R1011 Right upper quadrant pain: Secondary | ICD-10-CM

## 2011-02-14 DIAGNOSIS — R1012 Left upper quadrant pain: Secondary | ICD-10-CM

## 2011-02-14 DIAGNOSIS — R109 Unspecified abdominal pain: Secondary | ICD-10-CM | POA: Insufficient documentation

## 2011-02-14 DIAGNOSIS — K7689 Other specified diseases of liver: Secondary | ICD-10-CM | POA: Insufficient documentation

## 2011-02-14 DIAGNOSIS — R161 Splenomegaly, not elsewhere classified: Secondary | ICD-10-CM | POA: Insufficient documentation

## 2011-02-21 ENCOUNTER — Ambulatory Visit (HOSPITAL_COMMUNITY)
Admission: RE | Admit: 2011-02-21 | Discharge: 2011-02-21 | Disposition: A | Discharge status: Home / Self Care | Payer: Managed Care, Other (non HMO) | Source: Ambulatory Visit | Attending: Internal Medicine | Admitting: Internal Medicine

## 2011-02-21 ENCOUNTER — Encounter (HOSPITAL_BASED_OUTPATIENT_CLINIC_OR_DEPARTMENT_OTHER): Payer: Managed Care, Other (non HMO) | Admitting: Internal Medicine

## 2011-02-21 DIAGNOSIS — K31819 Angiodysplasia of stomach and duodenum without bleeding: Secondary | ICD-10-CM

## 2011-02-21 DIAGNOSIS — R1013 Epigastric pain: Secondary | ICD-10-CM | POA: Insufficient documentation

## 2011-02-21 DIAGNOSIS — R1310 Dysphagia, unspecified: Secondary | ICD-10-CM

## 2011-02-21 DIAGNOSIS — K7689 Other specified diseases of liver: Secondary | ICD-10-CM

## 2011-02-21 DIAGNOSIS — K2970 Gastritis, unspecified, without bleeding: Secondary | ICD-10-CM | POA: Insufficient documentation

## 2011-02-21 HISTORY — PX: UPPER GASTROINTESTINAL ENDOSCOPY: SHX188

## 2011-02-22 LAB — H. PYLORI ANTIBODY, IGG: H Pylori IgG: 0.49 {ISR}

## 2011-02-25 NOTE — Consult Note (Signed)
Billy Davis, Billy Davis                ACCOUNT NO.:  [id number]  MEDICAL RECORD NO.:  [id number]  LOCATION:                                 FACILITY:  PHYSICIAN:  Hildred Laser, M.D.    DATE OF BIRTH:  02-14-1956  DATE OF CONSULTATION:  01/30/2011 DATE OF DISCHARGE:                                CONSULTATION   PRESENTING COMPLAINT:  Upper and lower abdominal pain and dysphagia.  The patient with known history of cirrhosis secondary to NAFLD.  HISTORY OF PRESENT ILLNESS:  Billy Davis is 55 year old Caucasian male patient of Dr. Manuella Ghazi, who is here for scheduled visit.  He was last seen in [facility] in March 2011.  He is accompanied by his wife today.  He has multiple complaints.  He complains of pain in his upper and lower abdomen.  Pain is primarily in left upper quadrant, right upper quadrant, and right lower quadrant.  He also complains of diarrhea with 3-4 bowel movements daily.  He has intermittent hematochezia with his bowel movements, but never frank rectal bleeding.  He has history of IBS, but presently not taking any medications.  He also complains of dysphagia which is primarily to solids and particularly to meat which started few months ago.  He states he has regurgitated food multiple times in order to get relief.  He rarely experiences  heartburn while he is on medications and he has no difficulty swallowing solids.  He has noted need to clear his throat frequently, but he denies sore throat or hoarseness, but he does have intermittent cough.  He complains of fatigue.  He states he works 10 hours a day and 5-6 days a week and he does not have any time to do exercise or walking.  In the past, he used to walk.  He has gained 7 pounds since his last visit over a year ago. Today, he is also complaining of painful left breast.  He has not noted any discharge from the nipple.  CURRENT MEDICATIONS: 1. Urso 250 mg p.o. b.i.d. (he is only taking one a day). 2. Amlodipine 5 mg  daily. 3. Metoprolol 50 mg p.o. daily. 4. Lisinopril 40 mg p.o. daily. 5. MVI daily. 6. Omega fish oil 1 g p.o. daily. 7. Asa 81 mg daily. 8. Pantoprazole 40 mg p.o. q.a.m. 9. Chlorthalidone 25 mg p.o. daily. 10.Spironolactone 25 mg daily.  PAST MEDICAL HISTORY:  He has coronary artery disease and had stenting about 4-5 years ago.  He is under care of Dr. Dannielle Burn and is currently doing well.  He has biopsy-proven cirrhosis secondary to NAFLD.  His viral markers for B and C including HCV, RNA by PCR have been negative. His first liver biopsy was in October 1998, which showed steatosis, increased fibrosis, and mild inflammation.  He had second biopsy at the time of laparoscopic cholecystectomy in November 1999, and he had steatosis progressive fibrosis which was felt to be moderate and he had slender bands of fibrous tissue and pseudolobular formation.  He has chronic GERD, irritable bowel syndrome.  Morbid obesity with the continued weight gain.  He had EGD and a colonoscopy in July 2004.  He had erosive  gastritis and duodenitis, but no evidence of gastric or fundal varices.  I believe his H. pylori serology was negative, but I do not have the results.  His initial colonoscopy was in August 14, 1993, with removal of single polyp from the sigmoid colon which was hypoplastic.  In addition to cholecystectomy, he has had vasectomy and right knee arthroscopy.  ALLERGIES:  To IODINE DYE.  FAMILY HISTORY:  Positive for CAD in both parents.  Mother is deceased.  SOCIAL HISTORY:  He is married.  He has never drank alcohol.  He quit cigarette smoking over 10 years ago.  OBJECTIVE:  VITAL SIGNS:  Weight 337 pounds, he is 74 inches tall.  His BMI is 42.5.  Pulse 78 per minute, blood pressure 130/80, temp is 98.3. HEENT:  Conjunctivae are pink.  Sclerae nonicteric.  Oral pharyngeal mucosa is normal.  No neck masses or thyromegaly noted.  He does not have spider angiomata. CHEST:   Palpation of his breast revealed mild tenderness involving the left breast on the nipple, but no definite mass palpated. CARDIAC:  With regular rhythm.  Normal S1 and S2.  No murmur or gallop noted. LUNGS:  Clear to auscultation. ABDOMEN:  Obese.  Bowel sounds are normal.  On palpation soft with mild tenderness in RLQ, RUQ, and LUQ; however, spleen is not palpable.  Liver edge is 6-7 cm below RCM, it is minimally tender. RECTAL:  Deferred.  He has trace edema around his ankles.  He does not have clubbing.  He has multiple tattoos over his extremities and his chest.  ASSESSMENT:  Billy Davis has multiple GI issues. 1. 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  He is due     for screening for hepatocellular carcinoma.  He continues to gain     weight and it is a matter of time that his cirrhosis will catch up     with them and he is going to end up with ascites or other     complications.  I still believe he has time and he needs to take     charge of his life and try to exercise and lose weight.  He may     benefit from Dietary consultation.  He also needs to consider     Bariatric Surgery and I would like to discuss this with Dr. Manuella Ghazi     and Dr. Dannielle Burn. 2. Dysphagia.  He could have Schatzki's ring or stricture.  He has had     chronic gastroesophageal reflux disease, although his heartburn has     been well controlled. 3. Bilateral abdominal pain.  Suspect this is due to irritable bowel     syndrome. 4. Left breast pain.  On exam he does not have gynecomastia with a     suspect this may be due to spironolactone.  RECOMMENDATIONS: 1. We will do a CBC with Chem-20, TSH, and alpha-fetoprotein. 2. Dietary consultation for low-calorie diet in order to lose weight. 3. Abdominopelvic CT with oral contrast.  I would like for him to talk with Dr. Dannielle Burn and discontinue spironolactone.  Esophagogastroduodenoscopy and possibly esophageal dilation to be performed at [facility] in the  near future.  This examine would also allow [place] to find out whether or not he has developed esophageal or gastric varices.          ______________________________ Hildred Laser, M.D.     NR/MEDQ  D:  01/30/2011  T:  01/31/2011  Job:  [id number]  cc:   Luvenia Heller  Gerlene Burdock, MD,FACC [address] [place], [place] [address]  Dr. Manuella Ghazi

## 2011-02-25 NOTE — Op Note (Signed)
  Billy Davis, Billy Davis                ACCOUNT NO.:  [id number]  MEDICAL RECORD NO.:  [id number]  LOCATION:  DAYP                          FACILITY:  [facility]  PHYSICIAN:  Hildred Laser, M.D.    DATE OF BIRTH:  Dec 28, 1955  DATE OF PROCEDURE:  02/21/2011 DATE OF DISCHARGE:                              OPERATIVE REPORT   PROCEDURE:  Esophagogastroduodenoscopy with esophageal dilation.  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  He had abdominopelvic CT which shows splenomegaly and fatty liver with nodularity consistent with diagnosis of cirrhosis.  He is undergoing diagnostic/therapeutic EGD.  Procedures were reviewed with the patient. Informed consent was obtained.  MEDS FOR CONSCIOUS SEDATION:  Cetacaine spray for oropharyngeal topical anesthesia, Demerol 100 mg IV, Versed 10 mg IV.  FINDINGS:  Procedure performed in endoscopy suite.  The patient's vital signs and O2 sat were monitored during the procedure and remained stable.  The patient was placed in left lateral recumbent position and Pentax videoscope was passed through oropharynx without any difficulty into esophagus.  Esophagus.  Mucosa of the esophagus normal.  No varices were identified. GE junction was located at 41 cm from the incisors and was unremarkable.  Stomach.  It was empty and distended very well by insufflation.  Folds of proximal stomach are normal.  Examination of mucosa and body revealed two hyperplastic-appearing polyps which were left alone.  In the antrum, there were few punctate and then linear telangiectasia.  There was edema and erythema to single fold.  There were no erosions or ulcers noted. Pyloric channel was patent.  Angularis, fundus, and cardia were examined by retroflexion of scope and were normal.  More specifically, no  fundal varices were identified.  Duodenum.  Bulbar mucosa was normal.  Scope was passed to second part of the duodenal mucosa and folds were normal.  Endoscope was withdrawn.  Esophageal dilation was performed by passing 54-French Maloney dilator to full insertion.  Esophageal mucosa was reexamined post dilation and no mucosal disruption noted.  Endoscope was withdrawn.  The patient tolerated the procedure well.  FINAL DIAGNOSES: 1. Normal esophageal mucosa without ring or stricture formation.  No     evidence of esophageal and/or gastric varices. 2. Two small hyperplastic-appearing polyps at proximal gastric body     which were left alone. 3. Few antral vascular telangiectasia and focal gastritis at antrum.  RECOMMENDATIONS: 1. We will resume his asa, other meds as before. 2. We will check his H. pylori serology. 3. We will plan to see him back in the office in 3 months.          ______________________________ Hildred Laser, M.D.     NR/MEDQ  D:  02/21/2011  T:  02/21/2011  Job:  [id number]  cc:   Ernestine Mcmurray, MD,FACC [address] [place], [place] [address]  Dr. Manuella Ghazi  Electronically Signed by Hildred Laser M.D. on 02/25/2011 12:43:07 PM

## 2011-03-26 ENCOUNTER — Telehealth: Payer: Self-pay | Admitting: Cardiology

## 2011-03-30 MED ORDER — CHLORTHALIDONE 25 MG PO TABS: 25.0000 mg | ORAL_TABLET | Freq: Every day | ORAL | Status: DC

## 2011-03-30 NOTE — Telephone Encounter (Signed)
Spoke with wife.  Informed her that I would go ahead and send in rx for the Chlorthalidone 64m daily to [facility] for 90 day with no refills.  Scheduled 6 month f/u for 10/30.  Last OV & most recent d/c summary both state Spironolactone, but wife states that Dr. RLaural Goldenhad him stop that med due to his kidney function.  She requested that Dr. RLaural Goldensend uKoreanotes for documentation, but have not received to date.

## 2011-04-10 ENCOUNTER — Encounter (INDEPENDENT_AMBULATORY_CARE_PROVIDER_SITE_OTHER): Payer: Self-pay

## 2011-05-08 ENCOUNTER — Ambulatory Visit (INDEPENDENT_AMBULATORY_CARE_PROVIDER_SITE_OTHER): Payer: Managed Care, Other (non HMO) | Admitting: Internal Medicine

## 2011-06-04 ENCOUNTER — Ambulatory Visit (INDEPENDENT_AMBULATORY_CARE_PROVIDER_SITE_OTHER): Payer: Managed Care, Other (non HMO) | Admitting: Internal Medicine

## 2011-06-04 ENCOUNTER — Encounter (INDEPENDENT_AMBULATORY_CARE_PROVIDER_SITE_OTHER): Payer: Self-pay | Admitting: Internal Medicine

## 2011-06-04 VITALS — BP 140/80 | HR 84 | Temp 98.4°F | Resp 12 | Ht 74.0 in | Wt 321.0 lb

## 2011-06-04 DIAGNOSIS — K589 Irritable bowel syndrome without diarrhea: Secondary | ICD-10-CM

## 2011-06-04 MED ORDER — DICYCLOMINE HCL 20 MG PO TABS: 20.0000 mg | ORAL_TABLET | Freq: Every day | ORAL | Status: DC

## 2011-06-04 NOTE — Progress Notes (Signed)
Presenting complaint; followup for abdominal pain and cirrhosis; Subjective; patient is 55 year old Caucasian male who is here for scheduled visit. He was last seen 4 months ago. He has lost 16 pounds since that visit. He states he's been walking regularly at least 3 times a week and 3-1/2 miles each time. He's also change his eating habits. He feels a lot better since he has lost his weight. He continues to complain of pain in left upper quadrant which is intermittent. He says he was on vacation for 2 weeks and had no pain. He believes it is coming from stress. He is not having diarrhea and wants to drop dose of dicyclomine. Heartburn is well controlled with therapy. He denies melena or rectal bleeding. Current medications Current Outpatient Prescriptions  Medication Sig Dispense Refill  . amLODipine (NORVASC) 10 MG tablet Take 0.5 tablets by mouth Daily.      Marland Kitchen aspirin 81 MG tablet Take 81 mg by mouth daily.        Marland Kitchen dexlansoprazole (DEXILANT) 60 MG capsule Take 60 mg by mouth daily.        Marland Kitchen dicyclomine (BENTYL) 20 MG tablet Take 1 tablet (20 mg total) by mouth daily before breakfast.  30 tablet  5  . lisinopril (PRINIVIL,ZESTRIL) 40 MG tablet Take 1 tablet by mouth Daily.      . metoprolol (LOPRESSOR) 50 MG tablet Take 50 mg by mouth daily.       . Multiple Vitamin (MULTIVITAMIN) tablet Take 1 tablet by mouth daily.        Marland Kitchen NITROSTAT 0.4 MG SL tablet Place 1 tablet under the tongue every 5 (five) minutes as needed.      . Omega-3 Fatty Acids (FISH OIL) 1000 MG CAPS Take 1 capsule by mouth daily.        Marland Kitchen spironolactone (ALDACTONE) 25 MG tablet Take 25 mg by mouth daily.        . ursodiol (ACTIGALL) 250 MG tablet Take 250 mg by mouth daily.         objective BP 140/80  Pulse 84  Temp(Src) 98.4 F (36.9 C) (Oral)  Resp 12  Ht 6' 2"  (1.88 m)  Wt 321 lb (145.605 kg)  BMI 41.21 kg/m2 Conjunctiva is pink. Sclerae nonicteric. Oropharyngeal mucosa is normal No neck masses or thyromegaly  noted Abdomen is full but soft mild tenderness in LUQ; liver edge is up to 6 cm below the crossing margin laterally; spleen is not palpable. He does not have LE edema.. Assessment; #1. Biopsy proven cirrhosis secondary to NAFLD. I'm delighted to see that he's changed his lifestyle and has lost 16 pounds. Hopefully he would keep up with his routine and continue to lose weight. Recent EGD was negative for varices. #2. Chronic GERD. He is doing well with Dexilant. #3. LUQ abdominal pain out to be due to IBS and musculoskeletal. Since his diarrhea has resolved he can cut back on dicyclomine. Recommendations Decrease dicyclomine to 20 mg daily before breakfast. Office visit in 6 months.

## 2011-06-04 NOTE — Patient Instructions (Signed)
Decrease dicyclomine to 20 mg daily,  30 minutes before breakfast.

## 2011-06-11 LAB — CK TOTAL AND CKMB (NOT AT ARMC)
CK, MB: 1.2
CK, MB: 1.2
CK, MB: 1.4
CK, MB: 2.9
Relative Index: INVALID
Relative Index: INVALID
Relative Index: INVALID
Total CK: 77
Total CK: 80

## 2011-06-11 LAB — TROPONIN I
Troponin I: 0.01
Troponin I: 0.02
Troponin I: 0.03

## 2011-06-11 LAB — BASIC METABOLIC PANEL
BUN: 10
Calcium: 9.3
Chloride: 104
GFR calc Af Amer: >60
GFR calc non Af Amer: >60
GFR calc non Af Amer: >60
Glucose, Bld: 110 — ABNORMAL HIGH
Glucose, Bld: 97
Potassium: 3.7
Potassium: 4.2
Sodium: 139
Sodium: 142

## 2011-06-11 LAB — CBC
HCT: 43
Hemoglobin: 14.8
MCV: 84.5
Platelets: 169
RBC: 5.27
RDW: 13.9
WBC: 10.6 — ABNORMAL HIGH
WBC: 11.7 — ABNORMAL HIGH

## 2011-06-11 LAB — PROTIME-INR
INR: 1.1
Prothrombin Time: 14.6

## 2011-06-26 ENCOUNTER — Ambulatory Visit: Payer: Managed Care, Other (non HMO) | Admitting: Cardiology

## 2011-07-25 ENCOUNTER — Encounter (INDEPENDENT_AMBULATORY_CARE_PROVIDER_SITE_OTHER): Payer: Self-pay | Admitting: Internal Medicine

## 2011-07-25 ENCOUNTER — Encounter (INDEPENDENT_AMBULATORY_CARE_PROVIDER_SITE_OTHER): Payer: Self-pay | Admitting: *Deleted

## 2011-07-25 ENCOUNTER — Telehealth (INDEPENDENT_AMBULATORY_CARE_PROVIDER_SITE_OTHER): Payer: Self-pay | Admitting: *Deleted

## 2011-07-25 ENCOUNTER — Other Ambulatory Visit (INDEPENDENT_AMBULATORY_CARE_PROVIDER_SITE_OTHER): Payer: Self-pay | Admitting: *Deleted

## 2011-07-25 ENCOUNTER — Ambulatory Visit (INDEPENDENT_AMBULATORY_CARE_PROVIDER_SITE_OTHER): Payer: Managed Care, Other (non HMO) | Admitting: Internal Medicine

## 2011-07-25 VITALS — BP 120/78 | HR 72 | Temp 98.8°F | Ht 72.0 in | Wt 321.5 lb

## 2011-07-25 DIAGNOSIS — K625 Hemorrhage of anus and rectum: Secondary | ICD-10-CM

## 2011-07-25 DIAGNOSIS — Z800 Family history of malignant neoplasm of digestive organs: Secondary | ICD-10-CM

## 2011-07-25 MED ORDER — BISACODYL-PEG-KCL-NABICAR-NACL 5-210 MG-GM PO KIT: 1.0000 | PACK | Freq: Once | ORAL | Status: DC

## 2011-07-25 NOTE — Progress Notes (Signed)
Subjective:     Patient ID: Billy Davis, male   DOB: 1956-02-01, 55 y.o.   MRN: [id number]  HPIEddie is a 55 yr old male here today as a referral  Dr. Manuella Davis for a postive stool card.  Rectal exam performed at Dr. Trena Davis office last week. He tells me his prostate was not tender.  He tells me that his testicles and groin were  Painful on his visit and he was was started on Cipro.   He denies any melena or bright red rectal bleeding. Sometimes it is painful to have a BM.  He usually has a BM several times a day. Appetite is good. No unintentional weight loss.  Billy Davis and her son had colon cancer. Son was in his 66s. Billy Davis was in her 57s or 50s.   No abdominal pain. His last colonoscopy was in 2004 which revealed hyperplastic polyp.   Hx of biopsy proven cirrhosis secondary to NAFLD.  02/21/2011 EGD with YI:FOYDXA esophagus without ring or stricture formatio. No evidence of esophageal and/or gastric varices. Two small hyperplastic appearing polyps at proximal gastric body which were left alone. Few antral vascular telangiectasia and focal gastritis at antrum. Review of Systems see hpi Current Outpatient Prescriptions  Medication Sig Dispense Refill  . amLODipine (NORVASC) 10 MG tablet Take 0.5 tablets by mouth Daily.      Marland Kitchen aspirin 81 MG tablet Take 81 mg by mouth daily.        . ciprofloxacin (CIPRO) 500 MG tablet Take 500 mg by mouth 2 (two) times daily.        Marland Kitchen dicyclomine (BENTYL) 20 MG tablet Take 1 tablet (20 mg total) by mouth daily before breakfast.  30 tablet  5  . esomeprazole (NEXIUM) 40 MG capsule Take 40 mg by mouth daily before breakfast.        . lisinopril (PRINIVIL,ZESTRIL) 40 MG tablet Take 1 tablet by mouth Daily.      . metoprolol (LOPRESSOR) 50 MG tablet Take 50 mg by mouth daily.       . Multiple Vitamin (MULTIVITAMIN) tablet Take 1 tablet by mouth daily.        Marland Kitchen NITROSTAT 0.4 MG SL tablet Place 1 tablet under the tongue every 5 (five) minutes as needed.      . Omega-3 Fatty  Acids (FISH OIL) 1000 MG CAPS Take 1 capsule by mouth daily. 1278m      . ursodiol (ACTIGALL) 250 MG tablet Take 250 mg by mouth daily.         Past Surgical History  Procedure Date  . Right knee surgery   . Vasectomy   . Cholecystectomy 06/1998  . Colonoscopy 03/03/03  . Upper gastrointestinal endoscopy 02/21/2011    EGD ED  . Upper gastrointestinal endoscopy 03/03/03    TCS  . Two cardiac stents    Past Medical History  Diagnosis Date  . CAD, multiple vessel   . Mixed dyslipidemia   . Unspecified essential hypertension   . Chest pain, unspecified   . Cirrhosis of liver without mention of alcohol   . Esophageal reflux   . Unspecified diastolic heart failure   . Coronary atherosclerosis of native coronary artery   . Other chronic nonalcoholic liver disease   . Postsurgical percutaneous transluminal coronary angioplasty status   . Morbid obesity   . Diverticulosis   . IBS (irritable bowel syndrome)   . Dysphagia   . Lower abdominal pain    Family Status  Relation Status Death  Age  . Mother Deceased 13  . Father Deceased 43  . Sister Alive   . Brother Alive   . Daughter Alive   . Daughter Alive   . Daughter Alive   . Daughter Alive    History   Social History Narrative   Lives in [place], [place] [facility]  . Clopidogrel Bisulfate     REACTION: Rash  . Codeine   . Iohexol   . Ivp Dye (Iodinated Diagnostic Agents)        Objective:   Physical Exam Filed Vitals:   07/25/11 1405  BP: 120/78  Pulse: 72  Temp: 98.8 F (37.1 C)  Height: 6' (1.829 m)  Weight: 321 lb 8 oz (145.831 kg)    Alert and oriented. Skin warm and dry. Oral mucosa is moist. Natural teeth in good condition. Sclera anicteric, conjunctivae is pink. Thyroid not enlarged. No cervical lymphadenopathy. Lungs clear. Heart regular rate and rhythm.  Abdomen is soft. Bowel sounds are positive. Liver is 4 fingerbreadths below mid costal margin  when standing. No abdominal masses felt. Tenderness rt upper quadrant  No edema to lower extremities. Patient is alert and oriented.  No stool and guaiac negative.     Assessment:    Rectal bleeding. Colonic neoplasm, AVM,. Polyp needs to be ruled out. Family hx of colon cancer (Billy Davis and her son)    Plan:    Colonoscopy in the near future.The risks and benefits such as perforation, bleeding, and infection were reviewed with the patient and is agreeable.

## 2011-07-25 NOTE — Telephone Encounter (Signed)
Needs half lyetly

## 2011-07-25 NOTE — Patient Instructions (Signed)
Colonoscopy in the near future. The risks and benefits such as perforation, bleeding, and infection were reviewed with the patient and is agreeable.   Patient is agreeable.

## 2011-08-08 ENCOUNTER — Ambulatory Visit (INDEPENDENT_AMBULATORY_CARE_PROVIDER_SITE_OTHER): Payer: Managed Care, Other (non HMO) | Admitting: Physician Assistant

## 2011-08-08 ENCOUNTER — Encounter: Payer: Self-pay | Admitting: Physician Assistant

## 2011-08-08 DIAGNOSIS — I259 Chronic ischemic heart disease, unspecified: Secondary | ICD-10-CM

## 2011-08-08 DIAGNOSIS — E785 Hyperlipidemia, unspecified: Secondary | ICD-10-CM

## 2011-08-08 DIAGNOSIS — I10 Essential (primary) hypertension: Secondary | ICD-10-CM

## 2011-08-08 NOTE — Assessment & Plan Note (Signed)
No further workup. Continue current medication regimen. Followup with Dr Dannielle Burn in 1 year.

## 2011-08-08 NOTE — Assessment & Plan Note (Signed)
Well-controlled on current medication regimen 

## 2011-08-08 NOTE — Assessment & Plan Note (Signed)
Followed by Dr Laural Golden, per patient report

## 2011-08-08 NOTE — Patient Instructions (Signed)
Your physician you to follow up in 1 year. You will receive a reminder letter in the mail one-two months in advance. If you don't receive a letter, please call our office to schedule the follow-up appointment. Your physician recommends that you continue on your current medications as directed. Please refer to the Current Medication list given to you today. 

## 2011-08-08 NOTE — Progress Notes (Signed)
Cc: chest pain  HPI: 46 yom, h/o CAD, presents for scheduled followup.  When last seen, 5/12, by Dr Dannielle Burn, he was referred for a GXT CL. This was an adequate study, and negative for ishemia; EF 55%.  Clinically, he reports no interim development of exertional CP. He is followed regularly by Dr Laural Golden for liver cirrhosis (non alcoholic) and GERD. He was also taken off chlorthalidone, by Dr Laural Golden, due to renal dysfunction.  Of note, he suggests that prior episodes of CP, including most recent hospitalization earlier this year here at [facility], were due to "panic attacks", and not true angina.  PMH: reviewed and listed in Problem List in electronic Records (and see below)  Allergies/SH/FH: available in Electronic Records for review  Current Outpatient Prescriptions  Medication Sig Dispense Refill  . amLODipine (NORVASC) 10 MG tablet Take 0.5 tablets by mouth Daily.      Marland Kitchen aspirin 81 MG tablet Take 81 mg by mouth daily.        . chlorthalidone (HYGROTON) 25 MG tablet Take 25 mg by mouth daily.        Marland Kitchen dicyclomine (BENTYL) 20 MG tablet Take 1 tablet (20 mg total) by mouth daily before breakfast.  30 tablet  5  . esomeprazole (NEXIUM) 40 MG capsule Take 40 mg by mouth daily before breakfast.        . lisinopril (PRINIVIL,ZESTRIL) 40 MG tablet Take 1 tablet by mouth Daily.      . metoprolol (LOPRESSOR) 50 MG tablet Take 50 mg by mouth daily.       . Multiple Vitamin (MULTIVITAMIN) tablet Take 1 tablet by mouth daily.        Marland Kitchen NITROSTAT 0.4 MG SL tablet Place 1 tablet under the tongue every 5 (five) minutes as needed.      . Omega-3 Fatty Acids (FISH OIL) 1000 MG CAPS Take 1 capsule by mouth daily. 1225m      . ursodiol (ACTIGALL) 250 MG tablet Take 250 mg by mouth daily.          ROS: no nausea, vomiting; no fever, chills; no melena, hematochezia; no claudication  PHYSICAL EXAM:  BP 115/75  Pulse 56  Ht 6' 2"  (1.88 m)  Wt 329 lb 6.4 oz (149.415 kg)  BMI 42.29 kg/m2 GENERAL: 89yom,  sitting upright; NAD HEENT: NCAT, PERRLA, EOMI; sclera clear; no xanthelasma NECK: palpable bilateral carotid pulses, no bruits; no JVD; no TM LUNGS: CTA bilaterally CARDIAC: RRR (S1, S2); no significant murmurs; no rubs or gallops ABDOMEN: protuberant EXTREMETIES: 2+, bilateral peripheral edema SKIN: warm/dry; no obvious rash/lesions MUSCULOSKELETAL: no joint deformity NEURO: no focal deficit; NL affect   EKG:    ASSESSMENT & PLAN:

## 2011-08-09 ENCOUNTER — Ambulatory Visit: Payer: Managed Care, Other (non HMO) | Admitting: Cardiology

## 2011-08-15 ENCOUNTER — Encounter (HOSPITAL_COMMUNITY): Payer: Self-pay | Admitting: Pharmacy Technician

## 2011-08-16 MED ORDER — SODIUM CHLORIDE 0.45 % IV SOLN
Freq: Once | INTRAVENOUS | Status: AC
  Administered 2011-08-17: 1000 mL via INTRAVENOUS

## 2011-08-17 ENCOUNTER — Ambulatory Visit (HOSPITAL_COMMUNITY)
Admission: RE | Admit: 2011-08-17 | Discharge: 2011-08-17 | Disposition: A | Discharge status: Home / Self Care | Payer: Managed Care, Other (non HMO) | Source: Ambulatory Visit | Attending: Internal Medicine | Admitting: Internal Medicine

## 2011-08-17 ENCOUNTER — Encounter (HOSPITAL_COMMUNITY): Payer: Self-pay | Admitting: *Deleted

## 2011-08-17 ENCOUNTER — Other Ambulatory Visit (INDEPENDENT_AMBULATORY_CARE_PROVIDER_SITE_OTHER): Payer: Self-pay | Admitting: Internal Medicine

## 2011-08-17 ENCOUNTER — Encounter (HOSPITAL_COMMUNITY)
Admission: RE | Disposition: A | Discharge status: Home / Self Care | Payer: Self-pay | Source: Ambulatory Visit | Attending: Internal Medicine

## 2011-08-17 DIAGNOSIS — I10 Essential (primary) hypertension: Secondary | ICD-10-CM | POA: Insufficient documentation

## 2011-08-17 DIAGNOSIS — K921 Melena: Secondary | ICD-10-CM

## 2011-08-17 DIAGNOSIS — Z860100 Personal history of colon polyps, unspecified: Secondary | ICD-10-CM | POA: Insufficient documentation

## 2011-08-17 DIAGNOSIS — D128 Benign neoplasm of rectum: Secondary | ICD-10-CM | POA: Insufficient documentation

## 2011-08-17 DIAGNOSIS — D129 Benign neoplasm of anus and anal canal: Secondary | ICD-10-CM | POA: Insufficient documentation

## 2011-08-17 DIAGNOSIS — K648 Other hemorrhoids: Secondary | ICD-10-CM | POA: Insufficient documentation

## 2011-08-17 DIAGNOSIS — D126 Benign neoplasm of colon, unspecified: Secondary | ICD-10-CM | POA: Insufficient documentation

## 2011-08-17 DIAGNOSIS — K625 Hemorrhage of anus and rectum: Secondary | ICD-10-CM

## 2011-08-17 DIAGNOSIS — K5730 Diverticulosis of large intestine without perforation or abscess without bleeding: Secondary | ICD-10-CM | POA: Insufficient documentation

## 2011-08-17 DIAGNOSIS — E785 Hyperlipidemia, unspecified: Secondary | ICD-10-CM | POA: Insufficient documentation

## 2011-08-17 DIAGNOSIS — R1031 Right lower quadrant pain: Secondary | ICD-10-CM | POA: Insufficient documentation

## 2011-08-17 HISTORY — PX: COLONOSCOPY: SHX5424

## 2011-08-17 SURGERY — COLONOSCOPY
Anesthesia: Moderate Sedation

## 2011-08-17 MED ORDER — MIDAZOLAM HCL 5 MG/5ML IJ SOLN
INTRAMUSCULAR | Status: DC | PRN
  Administered 2011-08-17: 3 mg via INTRAVENOUS
  Administered 2011-08-17 (×2): 2 mg via INTRAVENOUS

## 2011-08-17 MED ORDER — MEPERIDINE HCL 50 MG/ML IJ SOLN
INTRAMUSCULAR | Status: AC
  Filled 2011-08-17: qty 1

## 2011-08-17 MED ORDER — STERILE WATER FOR IRRIGATION IR SOLN
Status: DC | PRN
  Administered 2011-08-17: 11:00:00

## 2011-08-17 MED ORDER — MIDAZOLAM HCL 5 MG/5ML IJ SOLN
INTRAMUSCULAR | Status: AC
  Filled 2011-08-17: qty 10

## 2011-08-17 MED ORDER — MEPERIDINE HCL 50 MG/ML IJ SOLN
INTRAMUSCULAR | Status: DC | PRN
  Administered 2011-08-17 (×2): 25 mg via INTRAVENOUS

## 2011-08-17 NOTE — Op Note (Signed)
COLONOSCOPY PROCEDURE REPORT  PATIENT:  Billy Davis  MR#:  [id number] Birthdate:  30-Sep-1955, 55 y.o., male Endoscopist:  Dr. Rogene Houston, MD Referred By:  Dr. Rayne Du ref. provider found Procedure Date: 08/17/2011  Procedure:   Colonoscopy with snare polypectomy.  Indications:  Patient is 55 year old Caucasian male with intermittent hematochezia results experiencing hypogastric and right lower quadrant pain. His last colonoscopy was over 8 years ago with removal of single polyp which was hyperplastic. Family history significant for colon carcinoma in a paternal aunt in her 13s and her son.  Informed Consent: Procedure and risks were reviewed with the patient and informed consent was obtained. Medications:  Demerol  50  mg IV Versed 7 mg IV  Description of procedure:  After a digital rectal exam was performed, that colonoscope was advanced from the anus through the rectum and colon to the area of the cecum, ileocecal valve and appendiceal orifice. The cecum was deeply intubated. These structures were well-seen and photographed for the record. From the level of the cecum and ileocecal valve, the scope was slowly and cautiously withdrawn. The mucosal surfaces were carefully surveyed utilizing scope tip to flexion to facilitate fold flattening as needed. The scope was pulled down into the rectum where a thorough exam including retroflexion was performed.  Findings:   Prep satisfactory. Normal terminal ileum. Small diverticula at cecum, ascending colon as well as sigmoid colon. Two 6 mm polyp snared from sigmoid colon and submitted in one container. Three 2-3 mm polyps cold snared from the rectum and submitted together. Hemorrhoids above the dentate line.  Therapeutic/Diagnostic Maneuvers Performed:  See above  Complications:  None  Cecal Withdrawal Time:  19 minutes  Impression:  Normal terminal ileum. U scattered diverticula at cecum, ascending and sigmoid colon. Two 6 mm polyps  snared from distal sigmoid colon and submitted together. Three 2-3 mm polyps cold snared from rectum and submitted together. Internal hemorrhoids.  Recommendations:  Standard instructions given. No aspirin for one week. I will be contacting patient with results of biopsy. We will also make an appointment with the urologist regarding his lower abdominal and genital pain.  Latona Krichbaum U  08/17/2011 11:35 AM  CC: Dr. [name], MD & Dr. Rayne Du ref. provider found

## 2011-08-17 NOTE — H&P (Signed)
This is an update to history and physical from 07/25/2011. There has been no interval change in patient's history medications or  symptoms.Marland Kitchen He is undergoing diagnostic colonoscopy

## 2011-08-29 ENCOUNTER — Encounter (INDEPENDENT_AMBULATORY_CARE_PROVIDER_SITE_OTHER): Payer: Self-pay | Admitting: *Deleted

## 2011-08-31 ENCOUNTER — Encounter (HOSPITAL_COMMUNITY): Payer: Self-pay | Admitting: Internal Medicine

## 2011-09-04 ENCOUNTER — Telehealth (INDEPENDENT_AMBULATORY_CARE_PROVIDER_SITE_OTHER): Payer: Self-pay | Admitting: *Deleted

## 2011-09-04 NOTE — Telephone Encounter (Signed)
Wife called and said Billy Davis was given a rx for feldene. He was told by his doctor to call Dr. Laural Golden and ask if it was okay for him to take. The return phone number is [phone].

## 2011-09-05 NOTE — Telephone Encounter (Signed)
Addressed with Dr. Laural Golden , he states as long as the Feldene is short term it will be okay. I called and spoke with the patient's wife, Jana Half, she said that it would be. He has a bone spur and it is located in a place that cannot be injected,and they plan to have him on the medication on a as needed basis.

## 2011-09-17 ENCOUNTER — Ambulatory Visit: Payer: Managed Care, Other (non HMO) | Admitting: Cardiology

## 2011-11-29 ENCOUNTER — Encounter (INDEPENDENT_AMBULATORY_CARE_PROVIDER_SITE_OTHER): Payer: Self-pay | Admitting: *Deleted

## 2012-01-22 ENCOUNTER — Ambulatory Visit (INDEPENDENT_AMBULATORY_CARE_PROVIDER_SITE_OTHER): Payer: Managed Care, Other (non HMO) | Admitting: Internal Medicine

## 2012-02-25 ENCOUNTER — Other Ambulatory Visit (INDEPENDENT_AMBULATORY_CARE_PROVIDER_SITE_OTHER): Payer: Self-pay | Admitting: Internal Medicine

## 2012-03-11 ENCOUNTER — Telehealth (INDEPENDENT_AMBULATORY_CARE_PROVIDER_SITE_OTHER): Payer: Self-pay | Admitting: *Deleted

## 2012-03-11 NOTE — Telephone Encounter (Signed)
Jana Half, patient's wife, called and said there insurance has change. The urso medication is now going to cost them $357 a month. This price is unaforable. Is there anything else Dr. Laural Golden can prescirbe cheaper and work also? Please return the call to [phone] or [phone]. Only 5 days of medication left.

## 2012-03-12 NOTE — Telephone Encounter (Signed)
Please call patient and ask him to check with pharmacy if generic Actigall would be cheaper. If Actigall is cheaper I called the prescription otherwise he could stop Urso

## 2012-03-12 NOTE — Telephone Encounter (Signed)
Billy Davis returned the call. Advised her what Dr. Laural Golden said.

## 2012-03-12 NOTE — Telephone Encounter (Signed)
LM for Jana Half or Anhad to return the call.

## 2012-03-24 DIAGNOSIS — C61 Malignant neoplasm of prostate: Secondary | ICD-10-CM

## 2012-03-24 HISTORY — DX: Malignant neoplasm of prostate: C61

## 2012-03-31 ENCOUNTER — Encounter (INDEPENDENT_AMBULATORY_CARE_PROVIDER_SITE_OTHER): Payer: Self-pay | Admitting: Internal Medicine

## 2012-03-31 ENCOUNTER — Ambulatory Visit (INDEPENDENT_AMBULATORY_CARE_PROVIDER_SITE_OTHER): Payer: Managed Care, Other (non HMO) | Admitting: Internal Medicine

## 2012-03-31 VITALS — BP 130/90 | HR 80 | Temp 97.3°F | Resp 20 | Ht 74.0 in | Wt 334.2 lb

## 2012-03-31 DIAGNOSIS — K219 Gastro-esophageal reflux disease without esophagitis: Secondary | ICD-10-CM

## 2012-03-31 DIAGNOSIS — K7460 Unspecified cirrhosis of liver: Secondary | ICD-10-CM

## 2012-03-31 DIAGNOSIS — K589 Irritable bowel syndrome without diarrhea: Secondary | ICD-10-CM

## 2012-03-31 NOTE — Progress Notes (Signed)
Presenting complaint;  Followup for cirrhosis GERD and IBS.  Subjective:  Patient is 56 year old Caucasian male who is here for scheduled visit. He was last seen in the office in October 2012 is seen again for colonoscopy in December 2012. He stopped Urso 10 days ago as his co-pay is too high. He has gained 13 pounds since his last visit. He states he has slacked up on his walking schedule. He complains of intermittent epigastric pain but denies nausea vomiting melena or rectal bleeding. Also complains of bloating. His heartburn is well controlled with PPI. He generally has 2-3 bowel movements per day. Patient states he does not understand why he keeps gaining weight when his wife eats twice amount of food that he doesn't she is not. Patient tells me that he had prostate biopsy in 03/20/2012 and was diagnosed with prostate carcinoma. Has an appointment to see his urologist next month to discuss treatment options.  Current Medications: Current Outpatient Prescriptions  Medication Sig Dispense Refill  . amLODipine (NORVASC) 10 MG tablet Take 5 mg by mouth Daily.       . chlorthalidone (HYGROTON) 25 MG tablet Take 25 mg by mouth daily.        Marland Kitchen dicyclomine (BENTYL) 10 MG capsule TAKE 1 CAPSULE THREE TIMES A DAY BEFORE MEALS  90 capsule  5  . dicyclomine (BENTYL) 20 MG tablet Take 1 tablet (20 mg total) by mouth daily before breakfast.  30 tablet  5  . esomeprazole (NEXIUM) 40 MG capsule Take 40 mg by mouth daily before breakfast.        . lisinopril (PRINIVIL,ZESTRIL) 40 MG tablet Take 40 mg by mouth Daily.       . metoprolol (LOPRESSOR) 50 MG tablet Take 50 mg by mouth daily.       . Multiple Vitamin (MULITIVITAMIN WITH MINERALS) TABS Take 1 tablet by mouth daily.        Marland Kitchen NITROSTAT 0.4 MG SL tablet Place 1 tablet under the tongue every 5 (five) minutes as needed. For chest pains      . Omega-3 Fatty Acids (FISH OIL) 1200 MG CAPS Take 1,200 mg by mouth daily.        . ursodiol (ACTIGALL) 250 MG  tablet Take 250 mg by mouth daily.           Objective: Blood pressure 130/90, pulse 80, temperature 97.3 F (36.3 C), temperature source Oral, resp. rate 20, height 6' 2"  (1.88 m), weight 334 lb 3.2 oz (151.592 kg). Patient is alert and does not have asterixis. Conjunctiva is pink. Sclera is nonicteric Oropharyngeal mucosa is normal. No neck masses or thyromegaly noted. Cardiac exam with regular rhythm normal S1 and S2. No murmur or gallop noted. Lungs are clear to auscultation. Abdomen is full. Abdomen is soft and nontender without organomegaly or masses.  No LE edema or clubbing noted.   Assessment: #1. Cirrhosis secondary toNAFLD. This condition was diagnosed 18 years ago and likely he has preserved hepatic function. EGD in June 2012 was negative for varices. Given his age cirrhosis could become an issue down the road. He needs to go back to his walking schedule and must lose weight. #2. Chronic GERD. Symptoms well controlled with therapy. He is having intermittent epigastric pain which may be secondary to IBS.Marland Kitchen   Plan: Will discontinue Urso because of cost issues. Patient can take times for epigastric pain and keep record as to the frequency of this symptom. He would go to lab for CBC LFTs and  AFP. Office visit in 6 months.

## 2012-03-31 NOTE — Patient Instructions (Addendum)
Keep symptom diary as to frequency of burning epigastric pain for the next 3 months. Can take TUMS 1-2 tablet twice daily as needed. Physician will contact you with results of blood work.

## 2012-04-03 ENCOUNTER — Other Ambulatory Visit (INDEPENDENT_AMBULATORY_CARE_PROVIDER_SITE_OTHER): Payer: Self-pay | Admitting: Internal Medicine

## 2012-04-04 LAB — AFP TUMOR MARKER: AFP-Tumor Marker: 3.8 ng/mL (ref 0.0–8.0)

## 2012-04-04 LAB — HEPATIC FUNCTION PANEL
ALT: 44 U/L — ABNORMAL HIGH (ref 0–35)
AST: 42 U/L — ABNORMAL HIGH (ref 0–37)
Alkaline Phosphatase: 57 U/L (ref 39–117)
Bilirubin, Direct: 0.1 mg/dL (ref 0.0–0.3)
Indirect Bilirubin: 0.4 mg/dL (ref 0.0–0.9)
Total Bilirubin: 0.5 mg/dL (ref 0.3–1.2)

## 2012-04-04 LAB — CBC
HCT: 44.2 % (ref 36.0–46.0)
Hemoglobin: 14.5 g/dL (ref 12.0–15.0)
MCHC: 32.8 g/dL (ref 30.0–36.0)
RBC: 5.15 MIL/uL — ABNORMAL HIGH (ref 3.87–5.11)

## 2012-04-17 ENCOUNTER — Telehealth (INDEPENDENT_AMBULATORY_CARE_PROVIDER_SITE_OTHER): Payer: Self-pay | Admitting: *Deleted

## 2012-04-17 DIAGNOSIS — K7460 Unspecified cirrhosis of liver: Secondary | ICD-10-CM

## 2012-04-17 NOTE — Telephone Encounter (Signed)
Per Dr.Rehman the patient will need to have a AFP in 6 months. Lab noted for February.

## 2012-05-20 ENCOUNTER — Other Ambulatory Visit: Payer: Self-pay | Admitting: Urology

## 2012-06-03 ENCOUNTER — Ambulatory Visit (INDEPENDENT_AMBULATORY_CARE_PROVIDER_SITE_OTHER): Payer: Managed Care, Other (non HMO) | Admitting: Cardiology

## 2012-06-03 ENCOUNTER — Encounter: Payer: Self-pay | Admitting: Cardiology

## 2012-06-03 VITALS — BP 132/80 | HR 72 | Ht 74.0 in | Wt 341.0 lb

## 2012-06-03 DIAGNOSIS — I729 Aneurysm of unspecified site: Secondary | ICD-10-CM

## 2012-06-03 DIAGNOSIS — I5030 Unspecified diastolic (congestive) heart failure: Secondary | ICD-10-CM | POA: Insufficient documentation

## 2012-06-03 DIAGNOSIS — Z91041 Radiographic dye allergy status: Secondary | ICD-10-CM | POA: Insufficient documentation

## 2012-06-03 DIAGNOSIS — I2510 Atherosclerotic heart disease of native coronary artery without angina pectoris: Secondary | ICD-10-CM | POA: Insufficient documentation

## 2012-06-03 DIAGNOSIS — I10 Essential (primary) hypertension: Secondary | ICD-10-CM

## 2012-06-03 DIAGNOSIS — Z01810 Encounter for preprocedural cardiovascular examination: Secondary | ICD-10-CM | POA: Insufficient documentation

## 2012-06-03 DIAGNOSIS — E785 Hyperlipidemia, unspecified: Secondary | ICD-10-CM

## 2012-06-03 DIAGNOSIS — C61 Malignant neoplasm of prostate: Secondary | ICD-10-CM

## 2012-06-03 DIAGNOSIS — I25118 Atherosclerotic heart disease of native coronary artery with other forms of angina pectoris: Secondary | ICD-10-CM | POA: Insufficient documentation

## 2012-06-03 DIAGNOSIS — K589 Irritable bowel syndrome without diarrhea: Secondary | ICD-10-CM | POA: Insufficient documentation

## 2012-06-03 DIAGNOSIS — R9430 Abnormal result of cardiovascular function study, unspecified: Secondary | ICD-10-CM | POA: Insufficient documentation

## 2012-06-03 NOTE — Assessment & Plan Note (Signed)
Blood pressures control. No change in therapy. 

## 2012-06-03 NOTE — Patient Instructions (Signed)
   Patient is cleared for surgery.  Continue all current medications. Your physician wants you to follow up in: 6 months.  You will receive a reminder letter in the mail one-two months in advance.  If you don't receive a letter, please call our office to schedule the follow up appointment

## 2012-06-03 NOTE — Progress Notes (Signed)
HPI   The patient is seen today to followup coronary disease and also to assess the patient for cardiac clearance for prostate surgery. The patient is stable. He has known coronary disease. He had an intervention in 2008. He is normal left ventricular function. He had exercise tests in 2010 and 2012. On both occasions he had no ischemia. He has normal LV function with an ejection fraction of 55%.  The patient is very active at work. He is not having any symptoms at work. He worries and he is anxious about his upcoming surgery. At times when at rest he notices some brief chest discomfort. This is not like his original chest pain in 2008. He has not had syncope or presyncope.  As part of today's evaluation I have reviewed prior records. I have never seen this patient before in our office. The patient has seen Dr.DeGent in the past. I made it clear to the patient and his wife that we are happy to help at this time. We are happy to continue to follow him. If he would like to follow with Dr. Dannielle Burn we will help with any aspect that we can.  Allergies  Allergen Reactions  . Iohexol Other (See Comments)    unknown  . Ivp Dye (Iodinated Diagnostic Agents) Other (See Comments)    Heart feels like its going to explode  . Clopidogrel Bisulfate Rash  . Codeine Palpitations    Current Outpatient Prescriptions  Medication Sig Dispense Refill  . amLODipine (NORVASC) 10 MG tablet Take 5 mg by mouth Daily.       Marland Kitchen aspirin 81 MG tablet Take 81 mg by mouth daily.      . chlorthalidone (HYGROTON) 25 MG tablet Take 25 mg by mouth daily.        Marland Kitchen dicyclomine (BENTYL) 20 MG tablet Take 1 tablet (20 mg total) by mouth daily before breakfast.  30 tablet  5  . esomeprazole (NEXIUM) 40 MG capsule Take 40 mg by mouth daily before breakfast.        . lisinopril (PRINIVIL,ZESTRIL) 40 MG tablet Take 40 mg by mouth Daily.       . metoprolol (LOPRESSOR) 50 MG tablet Take 50 mg by mouth daily.       . Multiple Vitamin  (MULITIVITAMIN WITH MINERALS) TABS Take 1 tablet by mouth daily.        Marland Kitchen NITROSTAT 0.4 MG SL tablet Place 1 tablet under the tongue every 5 (five) minutes as needed. For chest pains        History   Social History  . Marital Status: Married    Spouse Name: MARTHA    Number of Children: N/A  . Years of Education: N/A   Occupational History  . [employer]    Social History Main Topics  . Smoking status: Former Smoker -- 3.0 packs/day for 30 years    Types: Cigarettes    Quit date: 08/27/1998  . Smokeless tobacco: Never Used   Comment: Pt quit smoking 10 yrs ago, smoked for about 30 yrs.  . Alcohol Use: No  . Drug Use: No  . Sexually Active: Not on file   Other Topics Concern  . Not on file   Social History Narrative   Lives in [place], [place] at [employer]    Family History  Problem Relation Age of Onset  . Coronary artery disease      family history  . Heart disease Mother   . Breast cancer Mother   .  Heart disease Father   . Breast cancer Sister   . Ovarian cancer Sister   . Heart disease Brother   . Healthy Daughter   . Healthy Daughter   . Healthy Daughter   . Healthy Daughter     Past Medical History  Diagnosis Date  . CAD, multiple vessel     PCI circumflex 2008, diffuse disease of the right and LAD treated medically.,  //   Nuclear 2010 no ischemia   //   nuclear May, 2012 no ischemia  . Mixed dyslipidemia   . Unspecified essential hypertension   . Cirrhosis of liver without mention of alcohol   . Esophageal reflux   . Unspecified diastolic heart failure   . Other chronic nonalcoholic liver disease   . Postsurgical percutaneous transluminal coronary angioplasty status   . Morbid obesity   . Diverticulosis   . IBS (irritable bowel syndrome)   . Dysphagia   . Lower abdominal pain   . Prostate cancer 03/24/12  . Ejection fraction     EF 55%, nuclear, May, 2012  . Contrast media allergy   . 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 Preop cardiovascular exam     Cardiovascular clearance for prostate surgery October, 2013    Past Surgical History  Procedure Date  . Right knee surgery   . Vasectomy   . Cholecystectomy 06/1998  . Colonoscopy 03/03/03  . Upper gastrointestinal endoscopy 02/21/2011    EGD ED  . Upper gastrointestinal endoscopy 03/03/03    TCS  . Two cardiac stents   . Colonoscopy 08/17/2011    Procedure: COLONOSCOPY;  Surgeon: Rogene Houston, MD;  Location: [facility];  Service: Endoscopy;  Laterality: N/A;  1045    Patient Active Problem List  Diagnosis  . DYSLIPIDEMIA  . FATTY LIVER DISEASE  . Diastolic dysfunction  . GERD (gastroesophageal reflux disease)  . Hypertension  . Unspecified diastolic heart failure  . Morbid obesity  . IBS (irritable bowel syndrome)  . Prostate cancer  . Ejection fraction  . CAD, multiple vessel  . Contrast media allergy  . Aneurysm  . Preop cardiovascular exam    ROS   Patient denies fever, chills, headache, sweats, rash, change in vision, change in hearing, cough, nausea vomiting, urinary symptoms. All of the systems are reviewed and are negative.  PHYSICAL EXAM   Patient is overweight. He's here with his wife. He is oriented to person time and place. Affect is normal. There no carotid bruits. There is no jugulovenous distention. Lungs are clear. Respiratory effort is nonlabored. Cardiac exam reveals S1 and S2. There no clicks or significant murmurs. The abdomen is soft. There is no peripheral edema. There no musculoskeletal deformities. There are no skin rashes.  Filed Vitals:   06/03/12 1517  BP: 132/80  Pulse: 72  Height: 6' 2"  (1.88 m)  Weight: 341 lb (154.677 kg)   EKG is done today and reviewed by me. There is no significant abnormality.  ASSESSMENT & PLAN

## 2012-06-03 NOTE — Assessment & Plan Note (Signed)
The patient currently is not on a statin. I will discuss this again with him.

## 2012-06-03 NOTE — Assessment & Plan Note (Signed)
The patient needs to have prostate surgery soon. He will be cleared for this.

## 2012-06-03 NOTE — Assessment & Plan Note (Signed)
The patient has known coronary disease. His last stress study was approximately one year ago. However he works actively every day with no significant symptoms. He's had some recent resting chest discomfort that sounds more compatible with anxiety. His overall clinical status is stable.

## 2012-06-03 NOTE — Assessment & Plan Note (Signed)
The patient is stable from the cardiac viewpoint. He is cleared for his prostate surgery. He does not need any further cardiac workup at this time.

## 2012-06-18 ENCOUNTER — Encounter (HOSPITAL_COMMUNITY): Payer: Self-pay | Admitting: Pharmacy Technician

## 2012-06-20 ENCOUNTER — Ambulatory Visit (HOSPITAL_COMMUNITY)
Admission: RE | Admit: 2012-06-20 | Discharge: 2012-06-20 | Disposition: A | Discharge status: Home / Self Care | Payer: Managed Care, Other (non HMO) | Source: Ambulatory Visit | Attending: Urology | Admitting: Urology

## 2012-06-20 ENCOUNTER — Encounter (HOSPITAL_COMMUNITY): Payer: Self-pay

## 2012-06-20 ENCOUNTER — Encounter (HOSPITAL_COMMUNITY)
Admission: RE | Admit: 2012-06-20 | Discharge: 2012-06-20 | Disposition: A | Discharge status: Home / Self Care | Payer: Managed Care, Other (non HMO) | Source: Ambulatory Visit | Attending: Urology | Admitting: Urology

## 2012-06-20 DIAGNOSIS — Z01818 Encounter for other preprocedural examination: Secondary | ICD-10-CM | POA: Insufficient documentation

## 2012-06-20 LAB — BASIC METABOLIC PANEL
BUN: 11 mg/dL (ref 6–23)
CO2: 25 mEq/L (ref 19–32)
Chloride: 99 mEq/L (ref 96–112)
Creatinine, Ser: 0.73 mg/dL (ref 0.50–1.35)
GFR calc Af Amer: >90 mL/min (ref >90)
Glucose, Bld: 218 mg/dL — ABNORMAL HIGH (ref 70–99)
Potassium: 3.1 mEq/L — ABNORMAL LOW (ref 3.5–5.1)

## 2012-06-20 LAB — CBC
HCT: 42.9 % (ref 39.0–52.0)
Hemoglobin: 14.9 g/dL (ref 13.0–17.0)
MCHC: 34.7 g/dL (ref 30.0–36.0)
MCV: 83 fL (ref 78.0–100.0)
RDW: 13.4 % (ref 11.5–15.5)

## 2012-06-20 NOTE — Progress Notes (Signed)
Your patient has screened as at an elevated risk for Obstructive Sleep Apnea using the STOP-BANG tool during a pre-surgical visit. A score of 4 or greater is an elevated risk.

## 2012-06-20 NOTE — Patient Instructions (Signed)
20      Your procedure is scheduled on:  Thursday 06-26-2012 at 1100 am  Report to [facility] at 0830 AM.  Call this number if you have problems the morning of surgery: [place] [facility]!   Do not eat food or drink liquids after midnight!  Take these medicines the morning of surgery with A SIP OF WATER: Metoprolol, Nexium, Amlodipine   Do not bring valuables to the hospital.  .  Leave suitcase in the car. After surgery it may be brought to your room.  For patients admitted to the hospital, checkout time is 11:00 AM the day of              Discharge.    Special Instructions: See [facility] Preparing  For Surgery Instruction Sheet. Do not wear jewelry, lotions powders, perfumes. Women do not shave legs or underarms for 12 hours before showers. Contacts, partial plates, or dentures may not be worn into surgery.                          Patients discharged the day of surgery will not be allowed to drive home. If going home the same day of surgery, must have someone stay with you  first 24 hrs.at home and arrange for someone to drive you home from the Hospital.                Please read over the following fact sheets that you were given: MRSA              INFORMATION, Blood Transfusion sheet, Incentive Spirometry sheet,Sleeep apnea sheet              Billy Davis.Billy Witherspoon,RN,BSN [phone]

## 2012-06-20 NOTE — Progress Notes (Signed)
06/20/12 1336  OBSTRUCTIVE SLEEP APNEA  Have you ever been diagnosed with sleep apnea through a sleep study? No  Do you snore loudly (loud enough to be heard through closed doors)?  0  Do you often feel tired, fatigued, or sleepy during the daytime? 0  Has anyone observed you stop breathing during your sleep? 0  Do you have, or are you being treated for high blood pressure? 1  BMI more than 35 kg/m2? 1  Age over 55 years old? 1  Neck circumference greater than 40 cm/18 inches? 1  Gender: 1  Obstructive Sleep Apnea Score 5   Score 4 or greater  Results sent to PCP

## 2012-06-25 NOTE — H&P (Signed)
Chief Complaint  Prostate Cancer   Reason For Visit  Reason for consult: To discuss treatment options for prostate cancer and specifically to consider a robotic prostatectomy. Physician requesting consult: Dr. Irine Seal PCP: Dr. [name]   History of Present Illness  Billy Davis is a 56 year old who was found to have an elevated PSA of 6.23 prompting a prostate biopsy by Dr. Jeffie Pollock on 03/20/12 which confirmed Gleason 3+3=6 adenocarcinoma of the prostate in 2 out of 12 biopsy cores.  He has no family history of prostate cancer. He does have comorbid conditions including a history of coronary artery disease s/p cardiac stent placement in 2009 and 2010.  He is followed by Dr. Dannielle Burn and takes aspirin 81 mg daily. He had severe hives when taking Plavix and is no longer on that medication. He also has a history of cirrhosis of the liver although has had normal liver function tests and has not required recent treatment or intervention. He also has a history of tobacco use and smoked 2 1/2 PPD for 30 years prior to quitting in 200. He is also morbidly obese (330 lbs). In addition, he has a history of severe lower urinary tract symptoms although these apparently improved by decreasing his soft drink intake. However, Dr. Jeffie Pollock noted trabeculation of the bladder, a high PVR, and a low flow rate on evaluation suggesting components of bladder outlet obstruction.  TNM stage: cT1c Nx Mx PSA: 6.23 Gleason score: 3+3=6 Biopsy (03/20/12): 2/12 cores positive -- L apex (10%), L mid (60%) Prostate volume: 52 cc PSAD: 0.12  Nomogram: OC disease: 86% EPE: 9% SVI: 2% LNI: 1.5%  Urinary function: See above. IPSS 9. Erectile function: SHIM score is 24.   Past Medical History Problems  1. History of  Arthritis V13.4 2. History of  Chronic Liver Disease 571.9 3. History of  Cirrhosis 571.5 4. Former Smoker V15.82 5. History of  Heart Disease 429.9 6. History of  Heartburn 787.1 7. History of   Hydronephrosis 591 8. History of  Hypercholesterolemia 272.0 9. History of  Hypertension 401.9 10. History of  Nocturia 788.43  Surgical History Problems  1. History of  Cath Stent Placement 2. History of  Cholecystectomy Laparoscopic 3. History of  Knee Surgery 4. History of  Shoulder Surgery 5. History of  Surgery Of Male Genitalia Vasectomy V25.2  Current Meds 1. AmLODIPine Besylate 10 MG Oral Tablet; Therapy: (Recorded:09May2013) to 2. Chlorthalidone 25 MG Oral Tablet; Therapy: [id number] to 3. Diazepam 10 MG Oral Tablet; Take 1 -2 po one hour prior to procedure; Therapy: [phone] to  (Last Rx:24Jun2013) 4. Dicyclomine HCl 10 MG Oral Capsule; Therapy: (Recorded:09May2013) to 5. Lisinopril 40 MG Oral Tablet; Therapy: (Recorded:30Jul2008) to 6. Metoprolol Tartrate 50 MG Oral Tablet; Therapy: (Recorded:09May2013) to 7. Multi Vitamin/Minerals TABS; Therapy: (Recorded:09May2013) to 8. NexIUM 40 MG Oral Capsule Delayed Release; Therapy: 17Oct2012 to 9. Nitrostat 0.4 MG Sublingual Tablet Sublingual; Therapy: (Recorded:09May2013) to 10. Omega 3 CAPS; Therapy: (Recorded:09May2013) to 11. Tamsulosin HCl 0.4 MG Oral Capsule; TAKE 1 CAPSULE Daily with meals; Therapy: [phone]   to (Evaluate:20Jul2014)  Requested for: [phone]; Last Rx:25Jul2013 12. Ursodiol 250 MG Oral Tablet; Therapy: (Recorded:09May2013) to   He has not required nitroglycerin in over one year.   Allergies Medication  1. Codeine Derivatives 2. Plavix TABS 3. Omnipaque SOLN Non-Medication  4. Contrast Dye  Family History Denied  1. Family history of  Prostate Cancer  Social History Problems    Former Smoker V15.82 He smoked 2ppd for  about 30 years and quit about 13 years ago.   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  Cardiovascular: no chest pain and no leg swelling.  Respiratory: no shortness of breath and no shortness of breath during exertion.    Vitals Vital Signs [Data Includes: Last 1 Day]  24Sep2013 08:10AM  BMI Calculated: 42.61 BSA Calculated: 2.7 Height: 6 ft 2 in Weight: 332 lb  Blood Pressure: 149 / 89 Temperature: 99 F Heart Rate: 75  Physical Exam Constitutional: Well nourished and well developed . No acute distress.  ENT:. The ears and nose are normal in appearance.  Neck: The appearance of the neck is normal and no neck mass is present.  Pulmonary: No respiratory distress, normal respiratory rhythm and effort and clear bilateral breath sounds.  Cardiovascular: Heart rate and rhythm are normal . No peripheral edema.  Abdomen: The abdomen is obese. The abdomen is soft and nontender. No masses are palpated. No CVA tenderness. No hernias are palpable. No hepatosplenomegaly noted.  Rectal: Rectal exam demonstrates normal sphincter tone, no tenderness and no masses. Prostate size is estimated to be 60 g. The prostate is not indurated and is not tender. The left seminal vesicle is nonpalpable. The right seminal vesicle is nonpalpable. The perineum is normal on inspection.  Lymphatics: The femoral and inguinal nodes are not enlarged or tender.  Skin: Normal skin turgor, no visible rash and no visible skin lesions.  Neuro/Psych:. Mood and affect are appropriate.    Results/Data Urine [Data Includes: Last 1 Day]   24Sep2013 COLOR YELLOW  APPEARANCE CLEAR  SPECIFIC GRAVITY 1.025  pH 6.0  GLUCOSE NEG mg/dL BILIRUBIN NEG  KETONE NEG mg/dL BLOOD NEG  PROTEIN NEG mg/dL UROBILINOGEN 0.2 mg/dL NITRITE NEG  LEUKOCYTE ESTERASE NEG    I independently reviewed his medical records, PSA result, and pathology report. Findings are as dictated above.    Assessment Assessed  1. Prostate Cancer Swink Maintenance (V70.0)  1. UA With REFLEX  Done: 24Sep2013 07:48AM Prostate Cancer (185)  2. Follow-up Schedule Surgery Office  Follow-up  Requested for: 24Sep2013 3. PT Referral Referral  Referral  Requested for: 24Sep2013  Discussion/Summary  1. Prostate cancer:   The patient was counseled about the natural history of prostate cancer and the standard treatment options that are available for prostate cancer. It was explained to him how his age and life expectancy, clinical stage, Gleason score, and PSA affect his prognosis, the decision to proceed with additional staging studies, as well as how that information influences recommended treatment strategies. We discussed the roles for active surveillance, radiation therapy, surgical therapy, androgen deprivation, as well as ablative therapy options for the treatment of prostate cancer as appropriate to his individual cancer situation. We discussed the risks and benefits of these options with regard to their impact on cancer control and also in terms of potential adverse events, complications, and impact on quiality of life particularly related to urinary, bowel, and sexual function. The patient was encouraged to ask questions throughout the discussion today and all questions were answered to his stated satisfaction. In addition, the patient was provided with and/or directed to appropriate resources and literature for further education about prostate cancer and treatment options.   We discussed surgical therapy for prostate  cancer including the different available surgical approaches. We discussed, in detail, the risks and expectations of surgery with regard to cancer control, urinary control, and erectile function as well as the expected postoperative recovery process. Additional risks of surgery including but not limited to bleeding, infection, hernia formation, nerve damage, lymphocele  formation, bowel/rectal injury potentially necessitating colostomy, damage to the urinary tract resulting in urine leakage, urethral stricture, and the cardiopulmonary risks such as myocardial infarction, stroke, death, venothromboembolism, etc. were explained. The risk of open surgical conversion for robotic/laparoscopic prostatectomy was also discussed.   I had a long detailed discussion with Billy Davis and his wife today regarding his prostate cancer as well as his comorbid conditions and the potential technical challenges associated with surgical treatment of prostate cancer in obese patients.  At this time, he does wish to proceed with surgical therapy. I did offer him a radiation oncology consultation and back encouraged him to proceed with a radiation oncology consultation to be fully informed. He declines this option at this point although will notify me should he change his mind.  We discussed his history of coronary artery disease and other comorbidities. I recommended that he proceed with a cardiac risk assessment prior to proceeding with surgical treatment. This will be arranged with Dr. Dannielle Burn preoperatively.  We also discussed his obesity and the fact that this can make his surgery more technically challenging from a surgeon's standpoint but also can increase the risk of anesthetic complications. Among other things, we discussed the potential difficulty with mechanical ventilation in the Trendelenburg position and the increased risk for possible open surgical conversion.  He expressed his understanding of these issues and asked appropriate questions today. He will be tentatively scheduled for a bilateral nerve sparing robotic prostatectomy and will notify me if he would like to pursue a radiation oncology consultation at any time.  Cc: Dr. Irine Seal Dr. Butler Denmark    SignaturesElectronically signed by : Raynelle Bring, M.D.; May 20 2012  9:15AM

## 2012-06-26 ENCOUNTER — Encounter (HOSPITAL_COMMUNITY): Payer: Self-pay | Admitting: *Deleted

## 2012-06-26 ENCOUNTER — Inpatient Hospital Stay (HOSPITAL_COMMUNITY): Payer: Managed Care, Other (non HMO) | Admitting: *Deleted

## 2012-06-26 ENCOUNTER — Encounter (HOSPITAL_COMMUNITY)
Admission: RE | Disposition: A | Discharge status: Home / Self Care | Payer: Self-pay | Source: Ambulatory Visit | Attending: Urology

## 2012-06-26 ENCOUNTER — Observation Stay (HOSPITAL_COMMUNITY)
Admission: RE | Admit: 2012-06-26 | Discharge: 2012-06-27 | Disposition: A | Discharge status: Home / Self Care | Payer: Managed Care, Other (non HMO) | Source: Ambulatory Visit | Attending: Urology | Admitting: Urology

## 2012-06-26 DIAGNOSIS — Z79899 Other long term (current) drug therapy: Secondary | ICD-10-CM | POA: Insufficient documentation

## 2012-06-26 DIAGNOSIS — I10 Essential (primary) hypertension: Secondary | ICD-10-CM | POA: Insufficient documentation

## 2012-06-26 DIAGNOSIS — C61 Malignant neoplasm of prostate: Principal | ICD-10-CM | POA: Insufficient documentation

## 2012-06-26 DIAGNOSIS — K7460 Unspecified cirrhosis of liver: Secondary | ICD-10-CM | POA: Insufficient documentation

## 2012-06-26 DIAGNOSIS — E7800 Pure hypercholesterolemia, unspecified: Secondary | ICD-10-CM | POA: Insufficient documentation

## 2012-06-26 HISTORY — PX: ROBOT ASSISTED LAPAROSCOPIC RADICAL PROSTATECTOMY: SHX5141

## 2012-06-26 LAB — ABO/RH: ABO/RH(D): A POS

## 2012-06-26 LAB — HEMOGLOBIN AND HEMATOCRIT, BLOOD: HCT: 42 % (ref 39.0–52.0)

## 2012-06-26 LAB — TYPE AND SCREEN
ABO/RH(D): A POS
Antibody Screen: NEGATIVE

## 2012-06-26 SURGERY — ROBOTIC ASSISTED LAPAROSCOPIC RADICAL PROSTATECTOMY LEVEL 3
Anesthesia: General | Wound class: Clean Contaminated

## 2012-06-26 MED ORDER — ONDANSETRON HCL 4 MG/2ML IJ SOLN
INTRAMUSCULAR | Status: DC | PRN
  Administered 2012-06-26: 4 mg via INTRAVENOUS

## 2012-06-26 MED ORDER — METOPROLOL TARTRATE 50 MG PO TABS
50.0000 mg | ORAL_TABLET | Freq: Every day | ORAL | Status: DC
  Administered 2012-06-27: 50 mg via ORAL
  Filled 2012-06-26: qty 1

## 2012-06-26 MED ORDER — LACTATED RINGERS IV SOLN
INTRAVENOUS | Status: DC | PRN
  Administered 2012-06-26 (×2): via INTRAVENOUS

## 2012-06-26 MED ORDER — FENTANYL CITRATE 0.05 MG/ML IJ SOLN
INTRAMUSCULAR | Status: DC | PRN
  Administered 2012-06-26: 50 ug via INTRAVENOUS
  Administered 2012-06-26: 25 ug via INTRAVENOUS
  Administered 2012-06-26: 100 ug via INTRAVENOUS
  Administered 2012-06-26: 25 ug via INTRAVENOUS
  Administered 2012-06-26: 100 ug via INTRAVENOUS

## 2012-06-26 MED ORDER — PANTOPRAZOLE SODIUM 40 MG PO TBEC
80.0000 mg | DELAYED_RELEASE_TABLET | Freq: Every day | ORAL | Status: DC
  Administered 2012-06-27: 80 mg via ORAL
  Filled 2012-06-26: qty 2

## 2012-06-26 MED ORDER — AMLODIPINE BESYLATE 5 MG PO TABS
5.0000 mg | ORAL_TABLET | Freq: Every morning | ORAL | Status: DC
  Administered 2012-06-27: 5 mg via ORAL
  Filled 2012-06-26: qty 1

## 2012-06-26 MED ORDER — ASPIRIN 81 MG PO TABS
81.0000 mg | ORAL_TABLET | Freq: Every day | ORAL | Status: DC
  Administered 2012-06-26: 81 mg via ORAL
  Filled 2012-06-26 (×2): qty 1

## 2012-06-26 MED ORDER — BUPIVACAINE-EPINEPHRINE 0.25% -1:200000 IJ SOLN
INTRAMUSCULAR | Status: DC | PRN
  Administered 2012-06-26: 28 mL

## 2012-06-26 MED ORDER — DEXAMETHASONE SODIUM PHOSPHATE 4 MG/ML IJ SOLN
INTRAMUSCULAR | Status: DC | PRN
  Administered 2012-06-26: 10 mg via INTRAVENOUS

## 2012-06-26 MED ORDER — INDIGOTINDISULFONATE SODIUM 8 MG/ML IJ SOLN
INTRAMUSCULAR | Status: DC | PRN
  Administered 2012-06-26 (×2): 5 mL via INTRAVENOUS

## 2012-06-26 MED ORDER — HYDROMORPHONE HCL PF 1 MG/ML IJ SOLN
0.2500 mg | INTRAMUSCULAR | Status: DC | PRN
  Administered 2012-06-26 (×2): 0.5 mg via INTRAVENOUS
  Administered 2012-06-26: 0.25 mg via INTRAVENOUS

## 2012-06-26 MED ORDER — STERILE WATER FOR IRRIGATION IR SOLN
Status: DC | PRN
  Administered 2012-06-26: 3000 mL

## 2012-06-26 MED ORDER — METOCLOPRAMIDE HCL 5 MG/ML IJ SOLN
INTRAMUSCULAR | Status: DC | PRN
  Administered 2012-06-26: 10 mg via INTRAVENOUS

## 2012-06-26 MED ORDER — LABETALOL HCL 5 MG/ML IV SOLN
INTRAVENOUS | Status: DC | PRN
  Administered 2012-06-26: 5 mg via INTRAVENOUS

## 2012-06-26 MED ORDER — HYDROMORPHONE HCL PF 1 MG/ML IJ SOLN
INTRAMUSCULAR | Status: DC | PRN
  Administered 2012-06-26 (×2): 0.5 mg via INTRAVENOUS
  Administered 2012-06-26: 1 mg via INTRAVENOUS

## 2012-06-26 MED ORDER — SUCCINYLCHOLINE CHLORIDE 20 MG/ML IJ SOLN
INTRAMUSCULAR | Status: DC | PRN
  Administered 2012-06-26: 160 mg via INTRAVENOUS

## 2012-06-26 MED ORDER — DIPHENHYDRAMINE HCL 50 MG/ML IJ SOLN: 12.5000 mg | Freq: Four times a day (QID) | INTRAMUSCULAR | Status: DC | PRN

## 2012-06-26 MED ORDER — NITROGLYCERIN 0.4 MG SL SUBL: 0.4000 mg | SUBLINGUAL_TABLET | SUBLINGUAL | Status: DC | PRN

## 2012-06-26 MED ORDER — LACTATED RINGERS IV SOLN
INTRAVENOUS | Status: DC | PRN
  Administered 2012-06-26: 12:00:00

## 2012-06-26 MED ORDER — DIPHENHYDRAMINE HCL 12.5 MG/5ML PO ELIX: 12.5000 mg | ORAL_SOLUTION | Freq: Four times a day (QID) | ORAL | Status: DC | PRN

## 2012-06-26 MED ORDER — LACTATED RINGERS IV SOLN: INTRAVENOUS | Status: DC

## 2012-06-26 MED ORDER — DEXTROSE 5 % IV SOLN
3.0000 g | INTRAVENOUS | Status: AC
  Administered 2012-06-26: 3 g via INTRAVENOUS

## 2012-06-26 MED ORDER — MIDAZOLAM HCL 5 MG/5ML IJ SOLN
INTRAMUSCULAR | Status: DC | PRN
  Administered 2012-06-26: 2 mg via INTRAVENOUS

## 2012-06-26 MED ORDER — PROPOFOL 10 MG/ML IV BOLUS
INTRAVENOUS | Status: DC | PRN
  Administered 2012-06-26: 200 mg via INTRAVENOUS

## 2012-06-26 MED ORDER — PHENYLEPHRINE HCL 10 MG/ML IJ SOLN
INTRAMUSCULAR | Status: DC | PRN
  Administered 2012-06-26 (×2): 80 ug via INTRAVENOUS
  Administered 2012-06-26 (×7): 40 ug via INTRAVENOUS

## 2012-06-26 MED ORDER — MORPHINE SULFATE 2 MG/ML IJ SOLN
2.0000 mg | INTRAMUSCULAR | Status: DC | PRN
  Administered 2012-06-26: 2 mg via INTRAVENOUS
  Filled 2012-06-26: qty 1

## 2012-06-26 MED ORDER — ACETAMINOPHEN 325 MG PO TABS
650.0000 mg | ORAL_TABLET | ORAL | Status: DC | PRN
Start: 2012-06-26 — End: 2012-06-27

## 2012-06-26 MED ORDER — SODIUM CHLORIDE 0.9 % IV BOLUS (SEPSIS)
1000.0000 mL | Freq: Once | INTRAVENOUS | Status: AC
  Administered 2012-06-26: 1000 mL via INTRAVENOUS

## 2012-06-26 MED ORDER — KETAMINE HCL 10 MG/ML IJ SOLN
INTRAMUSCULAR | Status: DC | PRN
  Administered 2012-06-26 (×2): 2 mg via INTRAVENOUS
  Administered 2012-06-26: 1 mg via INTRAVENOUS
  Administered 2012-06-26 (×3): 2 mg via INTRAVENOUS
  Administered 2012-06-26: 1 mg via INTRAVENOUS
  Administered 2012-06-26: 2 mg via INTRAVENOUS
  Administered 2012-06-26: 1 mg via INTRAVENOUS
  Administered 2012-06-26: 50 mg via INTRAVENOUS

## 2012-06-26 MED ORDER — KCL IN DEXTROSE-NACL 20-5-0.45 MEQ/L-%-% IV SOLN
INTRAVENOUS | Status: DC
  Administered 2012-06-26 – 2012-06-27 (×3): via INTRAVENOUS
  Filled 2012-06-26 (×4): qty 1000

## 2012-06-26 MED ORDER — CHLORTHALIDONE 25 MG PO TABS
25.0000 mg | ORAL_TABLET | Freq: Every morning | ORAL | Status: DC
  Administered 2012-06-27: 25 mg via ORAL
  Filled 2012-06-26: qty 1

## 2012-06-26 MED ORDER — DOCUSATE SODIUM 100 MG PO CAPS
100.0000 mg | ORAL_CAPSULE | Freq: Two times a day (BID) | ORAL | Status: DC
  Administered 2012-06-26 – 2012-06-27 (×2): 100 mg via ORAL
  Filled 2012-06-26 (×3): qty 1

## 2012-06-26 MED ORDER — DICYCLOMINE HCL 20 MG PO TABS
10.0000 mg | ORAL_TABLET | Freq: Every day | ORAL | Status: DC
  Filled 2012-06-26 (×2): qty 1

## 2012-06-26 MED ORDER — ROCURONIUM BROMIDE 100 MG/10ML IV SOLN
INTRAVENOUS | Status: DC | PRN
  Administered 2012-06-26: 10 mg via INTRAVENOUS
  Administered 2012-06-26: 20 mg via INTRAVENOUS
  Administered 2012-06-26: 10 mg via INTRAVENOUS
  Administered 2012-06-26: 50 mg via INTRAVENOUS
  Administered 2012-06-26: 30 mg via INTRAVENOUS

## 2012-06-26 MED ORDER — EPHEDRINE SULFATE 50 MG/ML IJ SOLN
INTRAMUSCULAR | Status: DC | PRN
  Administered 2012-06-26: 5 mg via INTRAVENOUS
  Administered 2012-06-26: 15 mg via INTRAVENOUS
  Administered 2012-06-26: 10 mg via INTRAVENOUS
  Administered 2012-06-26: 5 mg via INTRAVENOUS

## 2012-06-26 MED ORDER — KETOROLAC TROMETHAMINE 15 MG/ML IJ SOLN
15.0000 mg | Freq: Four times a day (QID) | INTRAMUSCULAR | Status: DC
  Administered 2012-06-26 – 2012-06-27 (×4): 15 mg via INTRAVENOUS
  Filled 2012-06-26 (×6): qty 1

## 2012-06-26 MED ORDER — CEFAZOLIN SODIUM 1-5 GM-% IV SOLN
1.0000 g | Freq: Three times a day (TID) | INTRAVENOUS | Status: AC
  Administered 2012-06-26 – 2012-06-27 (×2): 1 g via INTRAVENOUS
  Filled 2012-06-26 (×2): qty 50

## 2012-06-26 MED ORDER — PROMETHAZINE HCL 25 MG/ML IJ SOLN: 6.2500 mg | INTRAMUSCULAR | Status: DC | PRN

## 2012-06-26 MED ORDER — SODIUM CHLORIDE 0.9 % IR SOLN
Status: DC | PRN
  Administered 2012-06-26: 1000 mL via INTRAVESICAL

## 2012-06-26 SURGICAL SUPPLY — 42 items
CANISTER SUCTION 2500CC (MISCELLANEOUS) ×2 IMPLANT
CATH FOLEY 2WAY SLVR 18FR 30CC (CATHETERS) ×2 IMPLANT
CATH ROBINSON RED A/P 8FR (CATHETERS) ×2 IMPLANT
CATH TIEMANN FOLEY 18FR 5CC (CATHETERS) ×2 IMPLANT
CHLORAPREP W/TINT 26ML (MISCELLANEOUS) ×2 IMPLANT
CLIP LIGATING HEM O LOK PURPLE (MISCELLANEOUS) ×4 IMPLANT
CLOTH BEACON ORANGE TIMEOUT ST (SAFETY) ×2 IMPLANT
CORD HIGH FREQUENCY UNIPOLAR (ELECTROSURGICAL) ×1 IMPLANT
COVER SURGICAL LIGHT HANDLE (MISCELLANEOUS) ×2 IMPLANT
COVER TIP SHEARS 8 DVNC (MISCELLANEOUS) ×1 IMPLANT
COVER TIP SHEARS 8MM DA VINCI (MISCELLANEOUS) ×1
CUTTER ECHEON FLEX ENDO 45 340 (ENDOMECHANICALS) ×2 IMPLANT
DECANTER SPIKE VIAL GLASS SM (MISCELLANEOUS) ×1 IMPLANT
DRAPE SURG IRRIG POUCH 19X23 (DRAPES) ×2 IMPLANT
DRSG TEGADERM 2-3/8X2-3/4 SM (GAUZE/BANDAGES/DRESSINGS) ×10 IMPLANT
DRSG TEGADERM 4X4.75 (GAUZE/BANDAGES/DRESSINGS) ×2 IMPLANT
DRSG TEGADERM 6X8 (GAUZE/BANDAGES/DRESSINGS) ×4 IMPLANT
ELECT REM PT RETURN 9FT ADLT (ELECTROSURGICAL) ×2
ELECTRODE REM PT RTRN 9FT ADLT (ELECTROSURGICAL) ×1 IMPLANT
GLOVE BIO SURGEON STRL SZ 6.5 (GLOVE) ×2 IMPLANT
GLOVE BIOGEL M STRL SZ7.5 (GLOVE) ×4 IMPLANT
GOWN STRL NON-REIN LRG LVL3 (GOWN DISPOSABLE) ×14 IMPLANT
HEMOSTAT SURGICEL 2X3 (HEMOSTASIS) ×1 IMPLANT
HOLDER FOLEY CATH W/STRAP (MISCELLANEOUS) ×2 IMPLANT
IV LACTATED RINGERS 1000ML (IV SOLUTION) ×1 IMPLANT
KIT ACCESSORY DA VINCI DISP (KITS) ×1
KIT ACCESSORY DVNC DISP (KITS) ×1 IMPLANT
LABEL STERILE EO BLANK 1X3 WHT (LABEL) ×1 IMPLANT
NDL SAFETY ECLIPSE 18X1.5 (NEEDLE) ×1 IMPLANT
NEEDLE HYPO 18GX1.5 SHARP (NEEDLE) ×2
PACK ROBOT UROLOGY CUSTOM (CUSTOM PROCEDURE TRAY) ×2 IMPLANT
RELOAD GREEN ECHELON 45 (STAPLE) ×2 IMPLANT
SET TUBE IRRIG SUCTION NO TIP (IRRIGATION / IRRIGATOR) ×2 IMPLANT
SOLUTION ELECTROLUBE (MISCELLANEOUS) ×2 IMPLANT
SPONGE GAUZE 4X4 12PLY (GAUZE/BANDAGES/DRESSINGS) ×2 IMPLANT
SUT ETHILON 3 0 PS 1 (SUTURE) ×2 IMPLANT
SUT VICRYL 0 UR6 27IN ABS (SUTURE) ×5 IMPLANT
SYR 27GX1/2 1ML LL SAFETY (SYRINGE) ×2 IMPLANT
TOWEL OR NON WOVEN STRL DISP B (DISPOSABLE) ×2 IMPLANT
TROCAR 12M 150ML BLUNT (TROCAR) ×1 IMPLANT
TROCAR BLADELESS OPT 5 100 (ENDOMECHANICALS) ×1 IMPLANT
WATER STERILE IRR 1500ML POUR (IV SOLUTION) ×3 IMPLANT

## 2012-06-26 NOTE — Anesthesia Preprocedure Evaluation (Signed)
Anesthesia Evaluation  Patient identified by MRN, date of birth, ID band Patient awake    Reviewed: Allergy & Precautions, H&P , NPO status , Patient's Chart, lab work & pertinent test results  History of Anesthesia Complications Negative for: history of anesthetic complications  Airway Mallampati: II TM Distance: <3 FB Neck ROM: Full    Dental  (+) Teeth Intact and Dental Advisory Given   Pulmonary neg pulmonary ROS,  breath sounds clear to auscultation  Pulmonary exam normal       Cardiovascular hypertension, Pt. on medications and Pt. on home beta blockers + CAD and + Cardiac Stents negative cardio ROS  Rhythm:Regular Rate:Normal     Neuro/Psych Anxiety negative neurological ROS     GI/Hepatic GERD-  Medicated,Fatty liver disease   Endo/Other  negative endocrine ROSMorbid obesity  Renal/GU negative Renal ROS  negative genitourinary   Musculoskeletal negative musculoskeletal ROS (+)   Abdominal   Peds  Hematology negative hematology ROS (+)   Anesthesia Other Findings   Reproductive/Obstetrics negative OB ROS                           Anesthesia Physical Anesthesia Plan  ASA: III  Anesthesia Plan: General   Post-op Pain Management:    Induction: Intravenous  Airway Management Planned: Oral ETT  Additional Equipment:   Intra-op Plan:   Post-operative Plan: Extubation in OR  Informed Consent: I have reviewed the patients History and Physical, chart, labs and discussed the procedure including the risks, benefits and alternatives for the proposed anesthesia with the patient or authorized representative who has indicated his/her understanding and acceptance.   Dental advisory given  Plan Discussed with: CRNA  Anesthesia Plan Comments:         Anesthesia Quick Evaluation

## 2012-06-26 NOTE — Progress Notes (Signed)
Dr Winfred Leeds reviewed EKG, no new orders.

## 2012-06-26 NOTE — Anesthesia Procedure Notes (Addendum)
Procedure Name: Intubation Date/Time: 06/26/2012 11:01 AM Performed by: Heide Scales CATHERINE PAYNE Pre-anesthesia Checklist: Patient identified, Emergency Drugs available, Suction available and Patient being monitored Patient Re-evaluated:Patient Re-evaluated prior to inductionOxygen Delivery Method: Circle system utilized Preoxygenation: Pre-oxygenation with 100% oxygen Intubation Type: IV induction Ventilation: Oral airway inserted - appropriate to patient size and Mask ventilation without difficulty Laryngoscope Size: Miller and 3 Grade View: Grade III Tube type: Oral Tube size: 8.0 mm Number of attempts: 2 Airway Equipment and Method: Stylet Placement Confirmation: ETT inserted through vocal cords under direct vision,  positive ETCO2 and breath sounds checked- equal and bilateral Secured at: 21 cm Tube secured with: Tape Dental Injury: Teeth and Oropharynx as per pre-operative assessment  Difficulty Due To: Difficult Airway- due to anterior larynx and Difficulty was unanticipated   Date/Time: 06/26/2012 11:05 AM Performed by: Heide Scales CATHERINE PAYNE Ventilation: Nasal airway inserted- appropriate to patient size Comments: Atraumatic insertion, no resistance, -heme, size 7.0

## 2012-06-26 NOTE — Anesthesia Postprocedure Evaluation (Signed)
Anesthesia Post Note  Patient: Billy Davis  Procedure(s) Performed: Procedure(s) (LRB): ROBOTIC ASSISTED LAPAROSCOPIC RADICAL PROSTATECTOMY LEVEL 3 (N/A)  Anesthesia type: General  Patient location: PACU  Post pain: Pain level controlled  Post assessment: Post-op Vital signs reviewed  Last Vitals:  Filed Vitals:   06/26/12 1430  BP: 150/75  Pulse: 67  Temp: 36.5 C  Resp: 8    Post vital signs: Reviewed  Level of consciousness: sedated  Complications: No apparent anesthesia complications

## 2012-06-26 NOTE — Preoperative (Signed)
Beta Blockers   Reason not to administer Beta Blockers:Not Applicable, took BB this am

## 2012-06-26 NOTE — Progress Notes (Signed)
No size thigh high TEDs to fit patient. Placed knee high instead.

## 2012-06-26 NOTE — Progress Notes (Signed)
Dr Winfred Leeds notified of pt complaint of r arm/shoulder pain.  BP  138/87.   Orders received.  Dr  At bedside.

## 2012-06-26 NOTE — Interval H&P Note (Signed)
History and Physical Interval Note:  06/26/2012 10:06 AM  Billy Davis  has presented today for surgery, with the diagnosis of PROSTATE CANCER  The various methods of treatment have been discussed with the patient and family. After consideration of risks, benefits and other options for treatment, the patient has consented to  Procedure(s) (LRB) with comments: ROBOTIC ASSISTED LAPAROSCOPIC RADICAL PROSTATECTOMY LEVEL 3 (N/A) - Possible Open   as a surgical intervention .  The patient's history has been reviewed, patient examined, no change in status, stable for surgery.  I have reviewed the patient's chart and labs.  Questions were answered to the patient's satisfaction.     Deaun Rocha,LES

## 2012-06-26 NOTE — Op Note (Signed)
Preoperative diagnosis: Clinically localized adenocarcinoma of the prostate (clinical stage cT1c Nx Mx)  Postoperative diagnosis: Clinically localized adenocarcinoma of the prostate (clinical stage cT1c Nx Mx)  Procedure:  1. Robotic assisted laparoscopic radical prostatectomy (bilateral nerve sparing)  Surgeon: Roxy Horseman, Brooke Bonito. M.D.  Assistant: Leta Baptist, PA-C  Anesthesia: General  Complications: None  EBL: 200 mL  IVF:  2000 mL crystalloid  Specimens: 1. Prostate and seminal vesicles  Disposition of specimens: Pathology  Drains: 1. 20 Fr coude catheter 2. # 19 Blake pelvic drain  Indication: Billy Davis is a 56 y.o. year old patient with clinically localized prostate cancer.  After a thorough review of the management options for treatment of prostate cancer, he elected to proceed with surgical therapy and the above procedure(s).  We have discussed the potential benefits and risks of the procedure, side effects of the proposed treatment, the likelihood of the patient achieving the goals of the procedure, and any potential problems that might occur during the procedure or recuperation. Informed consent has been obtained.  Description of procedure:  The patient was taken to the operating room and a general anesthetic was administered. He was given preoperative antibiotics, placed in the dorsal lithotomy position, and prepped and draped in the usual sterile fashion. Next a preoperative timeout was performed. A urethral catheter was placed into the bladder and a site was selected near the umbilicus for placement of the camera port. This was placed using a standard open Hassan technique which allowed entry into the peritoneal cavity under direct vision and without difficulty. Due to his obesity, extra long ports were used. A 12 mm port was placed and a pneumoperitoneum established. Communication with anesthesia confirmed that the patient was able to be ventilated well  despite his body habitus in this position and with the pneumoperitoneum pressure. The camera was then used to inspect the abdomen and there was no evidence of any intra-abdominal injuries or other abnormalities. The remaining abdominal ports were then placed. 8 mm robotic ports were placed in the right lower quadrant, left lower quadrant, and far left lateral abdominal wall. A 5 mm port was placed in the right upper quadrant and a 12 mm port was placed in the right lateral abdominal wall for laparoscopic assistance. All ports were placed under direct vision without difficulty. The surgical cart was then docked.   Utilizing the cautery scissors, the bladder was reflected posteriorly allowing entry into the space of Retzius and identification of the endopelvic fascia and prostate. The periprostatic fat was then removed from the prostate allowing full exposure of the endopelvic fascia. The endopelvic fascia was then incised from the apex back to the base of the prostate bilaterally and the underlying levator muscle fibers were swept laterally off the prostate thereby isolating the dorsal venous complex. The dorsal vein was then stapled and divided with a 45 mm Flex Echelon stapler. Attention then turned to the bladder neck which was divided anteriorly thereby allowing entry into the bladder and exposure of the urethral catheter. The catheter balloon was deflated and the catheter was brought into the operative field and used to retract the prostate anteriorly. The posterior bladder neck was then examined and was divided allowing further dissection between the bladder and prostate posteriorly until the vasa deferentia and seminal vessels were identified. The vasa deferentia were isolated, divided, and lifted anteriorly. The seminal vesicles were dissected down to their tips with care to control the seminal vascular arterial blood supply. These structures were then  lifted anteriorly and the space between Denonvillier's  fascia and the anterior rectum was developed with a combination of sharp and blunt dissection. This isolated the vascular pedicles of the prostate.  The lateral prostatic fascia was then sharply incised allowing release of the neurovascular bundles bilaterally. The vascular pedicles of the prostate were then ligated with Weck clips between the prostate and neurovascular bundles and divided with sharp cold scissor dissection resulting in neurovascular bundle preservation. The neurovascular bundles were then separated off the apex of the prostate and urethra bilaterally.  The urethra was then sharply transected allowing the prostate specimen to be disarticulated. The pelvis was copiously irrigated and hemostasis was ensured. There was no evidence for rectal injury.  Attention then turned to the urethral anastomosis. A 2-0 Vicryl slip knot was placed between Denonvillier's fascia, the posterior bladder neck, and the posterior urethra to reapproximate these structures. A double-armed 3-0 Monocryl suture was then used to perform a 360 running tension-free anastomosis between the bladder neck and urethra. A new urethral catheter was then placed into the bladder and irrigated. There were no blood clots within the bladder and the anastomosis appeared to be watertight. A #19 Blake drain was then brought through the left lateral 8 mm port site and positioned appropriately within the pelvis. It was secured to the skin with a nylon suture. The surgical cart was then undocked. The right lateral 12 mm port site was closed at the fascial level with a 0 Vicryl suture placed laparoscopically. All remaining ports were then removed under direct vision. The prostate specimen was removed intact within the Endopouch retrieval bag via the periumbilical camera port site. This fascial opening was closed with two running 0 Vicryl sutures. 0.25% Marcaine was then injected into all port sites and all incisions were reapproximated at the  skin level with staples. Sterile dressings were applied. The patient appeared to tolerate the procedure well and without complications. The patient was able to be extubated and transferred to the recovery unit in satisfactory condition.  Pryor Curia MD

## 2012-06-26 NOTE — Progress Notes (Signed)
Pt complaining of pressure up in his bladder. States he feels like he is going to "pop".  Bladder scan revealed 24 cc urine in bladder.  Will continue to monitor pt.

## 2012-06-26 NOTE — Transfer of Care (Signed)
Immediate Anesthesia Transfer of Care Note  Patient: Billy Davis  Procedure(s) Performed: Procedure(s) (LRB) with comments: ROBOTIC ASSISTED LAPAROSCOPIC RADICAL PROSTATECTOMY LEVEL 3 (N/A) -     Patient Location: PACU  Anesthesia Type:General  Level of Consciousness: awake, alert , oriented, patient cooperative and responds to stimulation  Airway & Oxygen Therapy: Patient Spontanous Breathing and Patient connected to face mask oxygen  Post-op Assessment: Report given to PACU RN, Post -op Vital signs reviewed and stable and Patient moving all extremities  Post vital signs: Reviewed and stable  Complications: No apparent anesthesia complications

## 2012-06-26 NOTE — Progress Notes (Signed)
Patient ID: Billy Davis, male   DOB: 11-Dec-1955, 56 y.o.   MRN: [id number]  Post-op note  Subjective: The patient is doing well.  No complaints.  Objective: Vital signs in last 24 hours: Temp:  [97.6 F (36.4 C)-98.1 F (36.7 C)] 98.1 F (36.7 C) (10/31 1500) Pulse Rate:  [64-93] 93  (10/31 1500) Resp:  [8-20] 12  (10/31 1500) BP: (129-159)/(68-95) 129/68 mmHg (10/31 1500) SpO2:  [93 %-98 %] 93 % (10/31 1445) Weight:  [158.26 kg (348 lb 14.4 oz)] 158.26 kg (348 lb 14.4 oz) (10/31 1500)  Intake/Output from previous day:   Intake/Output this shift: Total I/O In: 3350 [P.O.:350; I.V.:2000; IV Piggyback:1000] Out: 325 [Urine:100; Drains:25; Blood:200]  Physical Exam:  General: Alert and oriented. Abdomen: Soft, Nondistended. Incisions: Clean and dry.  Lab Results:  Basename 06/26/12 1342  HGB 14.6  HCT 42.0    Assessment/Plan: POD#0   1) Continue to monitor   Pryor Curia. MD   LOS: 0 days   Shandricka Monroy,LES 06/26/2012, 3:43 PM

## 2012-06-27 ENCOUNTER — Encounter (HOSPITAL_COMMUNITY): Payer: Self-pay | Admitting: Urology

## 2012-06-27 LAB — HEMOGLOBIN AND HEMATOCRIT, BLOOD: Hemoglobin: 13.2 g/dL (ref 13.0–17.0)

## 2012-06-27 MED ORDER — DICYCLOMINE HCL 10 MG PO CAPS
10.0000 mg | ORAL_CAPSULE | Freq: Every day | ORAL | Status: DC
  Administered 2012-06-27: 10 mg via ORAL
  Filled 2012-06-27 (×4): qty 1

## 2012-06-27 MED ORDER — HYDROCODONE-ACETAMINOPHEN 5-325 MG PO TABS: 1.0000 | ORAL_TABLET | Freq: Four times a day (QID) | ORAL | Status: DC | PRN

## 2012-06-27 MED ORDER — ASPIRIN EC 81 MG PO TBEC
81.0000 mg | DELAYED_RELEASE_TABLET | Freq: Every day | ORAL | Status: DC
  Administered 2012-06-27: 81 mg via ORAL
  Filled 2012-06-27: qty 1

## 2012-06-27 MED ORDER — BISACODYL 10 MG RE SUPP
10.0000 mg | Freq: Once | RECTAL | Status: AC
  Administered 2012-06-27: 10 mg via RECTAL
  Filled 2012-06-27: qty 1

## 2012-06-27 MED ORDER — CIPROFLOXACIN HCL 500 MG PO TABS: 500.0000 mg | ORAL_TABLET | Freq: Two times a day (BID) | ORAL | Status: DC

## 2012-06-27 MED ORDER — OXYCODONE HCL 5 MG PO TABS: 5.0000 mg | ORAL_TABLET | ORAL | Status: DC | PRN

## 2012-06-27 MED FILL — Cefazolin Sodium for IV Soln 2 GM and Dextrose 3% (50 ML): INTRAVENOUS | Qty: 50 | Status: AC

## 2012-06-27 NOTE — Progress Notes (Signed)
Patient ID: Billy Davis, male   DOB: 1956/01/01, 56 y.o.   MRN: [id number]  1 Day Post-Op Subjective: The patient is doing well.  No nausea or vomiting. Pain is adequately controlled. There was some difficulty with catheter drainage last night although SP pressure relieved once catheter started draining better.  Has drained well overnight.  Objective: Vital signs in last 24 hours: Temp:  [97.2 F (36.2 C)-98.1 F (36.7 C)] 98.1 F (36.7 C) (11/01 0508) Pulse Rate:  [64-95] 80  (11/01 0508) Resp:  [8-20] 16  (11/01 0508) BP: (103-159)/(53-95) 112/60 mmHg (11/01 0508) SpO2:  [93 %-98 %] 94 % (11/01 0508) Weight:  [158.26 kg (348 lb 14.4 oz)] 158.26 kg (348 lb 14.4 oz) (10/31 1500)  Intake/Output from previous day: 10/31 0701 - 11/01 0700 In: 5465 [P.O.:1290; I.V.:3125; IV Piggyback:1050] Out: 1264 [Urine:965; Drains:99; Blood:200] Intake/Output this shift:    Physical Exam:  General: Alert and oriented. CV: RRR Lungs: Clear bilaterally. GI: Soft, Nondistended. Incisions: Dressings intact. Urine: Clear Extremities: Nontender, no erythema, no edema.  Lab Results:  Basename 06/27/12 0454 06/26/12 1342  HGB 13.2 14.6  HCT 38.2* 42.0      Assessment/Plan: POD# 1 s/p robotic prostatectomy.  1) SL IVF 2) Ambulate, Incentive spirometry 3) Transition to oral pain medication 4) Dulcolax suppository 5) D/C pelvic drain 6) Plan for likely discharge later today   Pryor Curia. Billy Davis   LOS: 1 day   Billy Davis,LES 06/27/2012, 7:11 AM

## 2012-06-27 NOTE — Care Management Note (Signed)
    Page 1 of 1   06/27/2012     2:17:36 PM   CARE MANAGEMENT NOTE 06/27/2012  Patient:  Billy Davis, Billy Davis   Account Number:  [id number]  Date Initiated:  06/27/2012  Documentation initiated by:  Dessa Phi  Subjective/Objective Assessment:   ADMITTED W/PROSTATE CA.     Action/Plan:   FROM HOME   Anticipated DC Date:  06/27/2012   Anticipated DC Plan:  HOME/SELF CARE         Choice offered to / List presented to:             Status of service:  Completed, signed off Medicare Important Message given?   (If response is "NO", the following Medicare IM given date fields will be blank) Date Medicare IM given:   Date Additional Medicare IM given:    Discharge Disposition:    Per UR Regulation:  Reviewed for med. necessity/level of care/duration of stay  If discussed at [facility] of Stay Meetings, dates discussed:    Comments:  06/27/12 Alvera Tourigny RN,BSN NCM [phone] POD#1 LAP ROBOTIC PROSTATECTOMY

## 2012-06-27 NOTE — Progress Notes (Signed)
Spoke to pt about documented allergy to codeine with reaction of palpitations.  He states he does not recall taking oxycodone in the past and that his previous reaction was to benadryl and dyes.  Pt has tolerated hydrocodone during this hospitalization without issue.  He states he cannot take Tylenol due to liver cirrhosis.  Script provided for oxycodone with instructions to stop medication if he develops palpitations and to call our office.  He and his wife voiced understanding.

## 2012-06-27 NOTE — Discharge Summary (Signed)
  Date of admission: 06/26/2012  Date of discharge: 06/27/2012  Admission diagnosis: Prostate Cancer  Discharge diagnosis: Prostate Cancer  History and Physical: For full details, please see admission history and physical. Briefly, Billy Davis is a 56 y.o. gentleman with localized prostate cancer.  After discussing management/treatment options, he elected to proceed with surgical treatment.  Hospital Course: Billy Davis was taken to the operating room on 06/26/2012 and underwent a robotic assisted laparoscopic radical prostatectomy. He tolerated this procedure well and without complications. Postoperatively, he was able to be transferred to a regular hospital room following recovery from anesthesia.  He was able to begin ambulating the night of surgery. He remained hemodynamically stable overnight.  He had excellent urine output with appropriately minimal output from his pelvic drain and his pelvic drain was removed on POD #1.  He was transitioned to oral pain medication, tolerated a clear liquid diet, and had met all discharge criteria and was able to be discharged home later on POD#1.  Laboratory values:  Basename 06/27/12 0454 06/26/12 1342  HGB 13.2 14.6  HCT 38.2* 42.0    Disposition: Home  Discharge instruction: He was instructed to be ambulatory but to refrain from heavy lifting, strenuous activity, or driving. He was instructed on urethral catheter care.  Discharge medications:     Medication List     As of 06/27/2012  1:44 PM    START taking these medications         ciprofloxacin 500 MG tablet   Commonly known as: CIPRO   Take 1 tablet (500 mg total) by mouth 2 (two) times daily. Start day prior to office visit for foley removal      oxyCODONE 5 MG immediate release tablet   Commonly known as: Oxy IR/ROXICODONE   Take 1 tablet (5 mg total) by mouth every 4 (four) hours as needed for pain.      CONTINUE taking these medications         amLODipine 10 MG tablet   Commonly known as: NORVASC      chlorthalidone 25 MG tablet   Commonly known as: HYGROTON      dicyclomine 20 MG tablet   Commonly known as: BENTYL      esomeprazole 40 MG capsule   Commonly known as: NEXIUM      lisinopril 40 MG tablet   Commonly known as: PRINIVIL,ZESTRIL      metoprolol 50 MG tablet   Commonly known as: LOPRESSOR      NITROSTAT 0.4 MG SL tablet   Generic drug: nitroGLYCERIN      STOP taking these medications         aspirin 81 MG tablet      multivitamin with minerals Tabs          Where to get your medications    These are the prescriptions that you need to pick up.   You may get these medications from any pharmacy.         ciprofloxacin 500 MG tablet   oxyCODONE 5 MG immediate release tablet            Followup: He will followup in 1 week for catheter removal and to discuss his surgical pathology results.

## 2012-06-27 NOTE — Progress Notes (Signed)
Patient discharge to home, wife at bedside. Discharge instructions, Foley bag teaching, follow up appointment done, verbalized understanding. PIV removed no s/s of infiltration or swelling noted. Patient discharge with foley catheter.

## 2012-07-29 ENCOUNTER — Other Ambulatory Visit: Payer: Self-pay | Admitting: *Deleted

## 2012-07-29 MED ORDER — CHLORTHALIDONE 25 MG PO TABS: 25.0000 mg | ORAL_TABLET | Freq: Every day | ORAL | Status: DC

## 2012-08-25 ENCOUNTER — Telehealth: Payer: Self-pay | Admitting: Cardiology

## 2012-08-25 NOTE — Telephone Encounter (Signed)
See question below?

## 2012-08-25 NOTE — Telephone Encounter (Signed)
Billy Davis called stating that Dr.Borden put him on Cialis 5 mg.States that Dr. Alinda Money ask patient to contact Dr. Ron Parker to see if It was ok for him to take this medication with his heart conditions. Please call on Cell # [phone]

## 2012-08-31 NOTE — Telephone Encounter (Signed)
Cialis is fine, as long as he knows the usual warning that he cannot use it at any time around the use of nitroglycerin

## 2012-09-01 NOTE — Telephone Encounter (Signed)
Left message to return call 

## 2012-09-01 NOTE — Telephone Encounter (Signed)
Patient notified

## 2012-09-25 ENCOUNTER — Telehealth (INDEPENDENT_AMBULATORY_CARE_PROVIDER_SITE_OTHER): Payer: Self-pay | Admitting: *Deleted

## 2012-09-25 ENCOUNTER — Encounter (INDEPENDENT_AMBULATORY_CARE_PROVIDER_SITE_OTHER): Payer: Self-pay | Admitting: *Deleted

## 2012-09-25 DIAGNOSIS — K7460 Unspecified cirrhosis of liver: Secondary | ICD-10-CM

## 2012-09-25 NOTE — Telephone Encounter (Signed)
Lab order printed

## 2012-09-30 ENCOUNTER — Ambulatory Visit (INDEPENDENT_AMBULATORY_CARE_PROVIDER_SITE_OTHER): Payer: Managed Care, Other (non HMO) | Admitting: Internal Medicine

## 2012-12-17 ENCOUNTER — Other Ambulatory Visit: Payer: Self-pay | Admitting: Orthopaedic Surgery

## 2012-12-17 DIAGNOSIS — M25512 Pain in left shoulder: Secondary | ICD-10-CM

## 2012-12-21 ENCOUNTER — Ambulatory Visit
Admission: RE | Admit: 2012-12-21 | Discharge: 2012-12-21 | Disposition: A | Discharge status: Home / Self Care | Payer: Self-pay | Source: Ambulatory Visit | Attending: Orthopaedic Surgery | Admitting: Orthopaedic Surgery

## 2012-12-21 DIAGNOSIS — M25512 Pain in left shoulder: Secondary | ICD-10-CM

## 2013-02-23 ENCOUNTER — Ambulatory Visit (INDEPENDENT_AMBULATORY_CARE_PROVIDER_SITE_OTHER): Payer: Managed Care, Other (non HMO) | Admitting: Internal Medicine

## 2013-03-01 DIAGNOSIS — R079 Chest pain, unspecified: Secondary | ICD-10-CM

## 2013-03-11 DIAGNOSIS — R079 Chest pain, unspecified: Secondary | ICD-10-CM

## 2013-03-31 ENCOUNTER — Ambulatory Visit (INDEPENDENT_AMBULATORY_CARE_PROVIDER_SITE_OTHER): Payer: Managed Care, Other (non HMO) | Admitting: Internal Medicine

## 2013-03-31 ENCOUNTER — Encounter (INDEPENDENT_AMBULATORY_CARE_PROVIDER_SITE_OTHER): Payer: Self-pay | Admitting: Internal Medicine

## 2013-03-31 VITALS — BP 140/82 | HR 84 | Temp 98.6°F | Resp 18 | Ht 74.0 in | Wt 345.7 lb

## 2013-03-31 DIAGNOSIS — R609 Edema, unspecified: Secondary | ICD-10-CM

## 2013-03-31 DIAGNOSIS — R600 Localized edema: Secondary | ICD-10-CM

## 2013-03-31 DIAGNOSIS — K5900 Constipation, unspecified: Secondary | ICD-10-CM

## 2013-03-31 DIAGNOSIS — K7460 Unspecified cirrhosis of liver: Secondary | ICD-10-CM

## 2013-03-31 DIAGNOSIS — R109 Unspecified abdominal pain: Secondary | ICD-10-CM

## 2013-03-31 MED ORDER — POTASSIUM CHLORIDE ER 10 MEQ PO TBCR: 10.0000 meq | EXTENDED_RELEASE_TABLET | ORAL | Status: DC

## 2013-03-31 MED ORDER — DOCUSATE SODIUM 100 MG PO CAPS: 100.0000 mg | ORAL_CAPSULE | Freq: Two times a day (BID) | ORAL | Status: DC

## 2013-03-31 MED ORDER — POTASSIUM CHLORIDE ER 10 MEQ PO TBCR: 20.0000 meq | EXTENDED_RELEASE_TABLET | ORAL | Status: DC

## 2013-03-31 MED ORDER — LIDOCAINE 5 % EX PTCH: 1.0000 | MEDICATED_PATCH | CUTANEOUS | Status: DC

## 2013-03-31 MED ORDER — FUROSEMIDE 40 MG PO TABS: 40.0000 mg | ORAL_TABLET | ORAL | Status: DC

## 2013-03-31 MED ORDER — POTASSIUM CHLORIDE ER 10 MEQ PO TBCR: 10.0000 meq | EXTENDED_RELEASE_TABLET | Freq: Two times a day (BID) | ORAL | Status: DC

## 2013-03-31 NOTE — Patient Instructions (Addendum)
Please ask Dr. Manuella Ghazi for hepatitis A and B. Vaccination. Physician will contact her with results of blood tests. Do not take the Hygroton and on days when you take furosemide. Call me if you have any questions about seeing bariatric surgeon.

## 2013-03-31 NOTE — Progress Notes (Signed)
Presenting complaint;  Followup for cirrhosis and GERD. Patient complains of hypogastric pain and constipation.  Subjective:  Patient is 57 year old Caucasian male who is here for yearly visit. He has biopsy-proven cirrhosis secondary to NAFLD. He has multiple complaints. He feels none in his toes and distal half of his feet. He has no energy. He is not able to do anything other than his work. He wants to go on medical leave for one year. He is already discuss this with his primary care physician Dr. Manuella Ghazi who told him it was not feasible. He has gained 11 pounds since his last visit one year ago to he has gained 24 pounds since October 2012. Lately he has been having more heartburn. 4 weeks ago he was admitted to [facility] chest pain an MI was ruled out. He had robotic radical prostatectomy in October 2013. Ever since he's experienced hypogastric pain. Pain is gradually decreasing in intensity. Last week he had abdominopelvic CT with contrast by Dr. Alinda Money and no abnormality was noted to account for symptoms. Lately he has been constipated while in the past he has had diarrhea felt to be due to IBS. He denies melena or rectal bleeding.  Current Medications: Current Outpatient Prescriptions  Medication Sig Dispense Refill  . amLODipine (NORVASC) 10 MG tablet Take 5 mg by mouth every morning.       . chlorthalidone (HYGROTON) 25 MG tablet Take 1 tablet (25 mg total) by mouth daily.  90 tablet  3  . dicyclomine (BENTYL) 20 MG tablet Take 10 mg by mouth daily before breakfast.      . esomeprazole (NEXIUM) 40 MG capsule Take 40 mg by mouth daily before breakfast.       . lisinopril (PRINIVIL,ZESTRIL) 40 MG tablet Take 40 mg by mouth every morning.       . metoprolol (LOPRESSOR) 50 MG tablet Take 50 mg by mouth daily.       . Multiple Vitamin (MULTI VITAMIN MENS PO) Take by mouth daily.      Marland Kitchen NITROSTAT 0.4 MG SL tablet Place 1 tablet under the tongue every 5 (five) minutes as needed. For chest pains      .  oxyCODONE (ROXICODONE) 5 MG immediate release tablet Take 1 tablet (5 mg total) by mouth every 4 (four) hours as needed for pain.  30 tablet  0   No current facility-administered medications for this visit.     Objective: Blood pressure 140/82, pulse 84, temperature 98.6 F (37 C), temperature source Oral, resp. rate 18, height 6' 2"  (1.88 m), weight 345 lb 11.2 oz (156.808 kg). Patient is alert and in no acute distress. Conjunctiva is pink. Sclera is nonicteric Oropharyngeal mucosa is normal. No neck masses or thyromegaly noted. Cardiac exam with regular rhythm normal S1 and S2. No murmur or gallop noted. Lungs are clear to auscultation. Abdomen is obese. Abdomen is soft with mild tenderness and hypogastric region. No organomegaly or masses noted. Shifting dullness is absent.  He has 1-2+ pitting edema involving both legs below the level of knees.  Labs/studies Results: Abdominopelvic CT from 03/18/2013 reviewed previous study done with contrast. It shows changes of cirrhosis and splenomegaly. No evidence of ascites. Evidence of prostatectomy.  Assessment:  #1. 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 This was diagnosed close to 20 years ago in he's been lucky so far that he has not experienced sequelae of hepatic dysfunction. If status quo continues he will end up with ascites or other complications. By virtue of obesity  he will not be a candidate for liver transplant. Recent CT negative for suspicious lesions in the liver. Patient needs to consider bariatric surgery while this is feasible before risk for any kind of surgery increases #2. Lower extremity Edema. No evidence of ascites on recent CT #3. Lower abdominal pain with onset following robotic radical prostatectomy in October 2013. Pain severity is decreasing but it has not gone away completely. Abdominal pelvic CT last week with contrast failed to show any abnormality to account for pain. Since he has developed constipation it is  possible that pain is secondary to IBS but she has history of. #4.   Plan:  CBC, comprehensive chemistry panel, AFP and hemoglobin A1c. Colace 200 mg by mouth each bedtime. Furosemide 40 mg by mouth every Monday Wednesday and Friday. Potassium chloride 20 mEq by mouth every Monday Wednesday and Friday. Patient advised not to take Hygroton and on days he takes furosemide. Patient will talk with Dr. Manuella Ghazi about getting hepatitis A and B. Vaccination. Patient advised to read up on bariatric surgery and let me know if he is interested Office visit in 6 months.

## 2013-04-01 ENCOUNTER — Other Ambulatory Visit (INDEPENDENT_AMBULATORY_CARE_PROVIDER_SITE_OTHER): Payer: Self-pay | Admitting: Internal Medicine

## 2013-04-01 DIAGNOSIS — R600 Localized edema: Secondary | ICD-10-CM

## 2013-04-01 LAB — COMPREHENSIVE METABOLIC PANEL
ALT: 40 U/L (ref 0–53)
AST: 46 U/L — ABNORMAL HIGH (ref 0–37)
Alkaline Phosphatase: 68 U/L (ref 39–117)
Creat: 0.83 mg/dL (ref 0.50–1.35)
Sodium: 139 mEq/L (ref 135–145)
Total Bilirubin: 0.7 mg/dL (ref 0.3–1.2)
Total Protein: 7 g/dL (ref 6.0–8.3)

## 2013-04-01 LAB — CBC
MCH: 29.1 pg (ref 26.0–34.0)
MCV: 83.7 fL (ref 78.0–100.0)
Platelets: 95 10*3/uL — ABNORMAL LOW (ref 150–400)
RBC: 4.84 MIL/uL (ref 4.22–5.81)
RDW: 14.8 % (ref 11.5–15.5)

## 2013-04-01 MED ORDER — POTASSIUM CHLORIDE ER 10 MEQ PO TBCR: 20.0000 meq | EXTENDED_RELEASE_TABLET | Freq: Every day | ORAL | Status: DC

## 2013-04-02 ENCOUNTER — Telehealth (INDEPENDENT_AMBULATORY_CARE_PROVIDER_SITE_OTHER): Payer: Self-pay | Admitting: *Deleted

## 2013-04-02 DIAGNOSIS — K7460 Unspecified cirrhosis of liver: Secondary | ICD-10-CM

## 2013-04-02 DIAGNOSIS — R7309 Other abnormal glucose: Secondary | ICD-10-CM

## 2013-04-02 NOTE — Telephone Encounter (Signed)
Per Dr.Rehman the patient will need to have labs drawn in 2 week, noted for 04/22/13.

## 2013-04-09 ENCOUNTER — Telehealth (INDEPENDENT_AMBULATORY_CARE_PROVIDER_SITE_OTHER): Payer: Self-pay | Admitting: *Deleted

## 2013-04-09 NOTE — Telephone Encounter (Signed)
The mail order pharmacy called and states that they are in need of a clarification on the Potassium.  Spoke with Dr.Rehman and he states that the prescription is as follows: Potassium Chloride (K-Dur) 20 mEq  Take 1 by mouth daily #30 and no refills. I called the mail order back and spoke with the Pharmacist IJ Ntukidem, I gave him the prescription. He stated that they could fill this but they normally did the 90 day supply. I called the above prescription to [facility]. Patient was called and made aware,message left on his cell phone. Also reminded the patient that he was to have lab work on 04/22/13.

## 2013-04-22 LAB — BASIC METABOLIC PANEL
BUN: 16 mg/dL (ref 6–23)
CO2: 24 mEq/L (ref 19–32)
Chloride: 103 mEq/L (ref 96–112)
Glucose, Bld: 129 mg/dL — ABNORMAL HIGH (ref 70–99)
Potassium: 3.6 mEq/L (ref 3.5–5.3)
Sodium: 140 mEq/L (ref 135–145)

## 2013-07-03 ENCOUNTER — Other Ambulatory Visit: Payer: Self-pay | Admitting: Cardiology

## 2013-07-20 ENCOUNTER — Encounter: Payer: Self-pay | Admitting: Cardiology

## 2013-08-17 ENCOUNTER — Encounter (INDEPENDENT_AMBULATORY_CARE_PROVIDER_SITE_OTHER): Payer: Self-pay | Admitting: *Deleted

## 2013-09-04 ENCOUNTER — Other Ambulatory Visit (INDEPENDENT_AMBULATORY_CARE_PROVIDER_SITE_OTHER): Payer: Self-pay | Admitting: Internal Medicine

## 2013-09-08 ENCOUNTER — Other Ambulatory Visit: Payer: Self-pay | Admitting: Cardiology

## 2013-11-02 ENCOUNTER — Ambulatory Visit (INDEPENDENT_AMBULATORY_CARE_PROVIDER_SITE_OTHER): Payer: Managed Care, Other (non HMO) | Admitting: Internal Medicine

## 2013-11-09 ENCOUNTER — Ambulatory Visit (INDEPENDENT_AMBULATORY_CARE_PROVIDER_SITE_OTHER): Payer: Managed Care, Other (non HMO) | Admitting: Internal Medicine

## 2013-11-09 ENCOUNTER — Encounter (INDEPENDENT_AMBULATORY_CARE_PROVIDER_SITE_OTHER): Payer: Self-pay | Admitting: Internal Medicine

## 2013-11-09 VITALS — BP 130/80 | HR 82 | Temp 98.4°F | Resp 18 | Ht 74.0 in | Wt 331.1 lb

## 2013-11-09 DIAGNOSIS — K7460 Unspecified cirrhosis of liver: Secondary | ICD-10-CM

## 2013-11-09 DIAGNOSIS — E119 Type 2 diabetes mellitus without complications: Secondary | ICD-10-CM | POA: Insufficient documentation

## 2013-11-09 DIAGNOSIS — D6959 Other secondary thrombocytopenia: Secondary | ICD-10-CM

## 2013-11-09 DIAGNOSIS — D731 Hypersplenism: Secondary | ICD-10-CM

## 2013-11-09 NOTE — Progress Notes (Signed)
Presenting complaint;  Followup for cirrhosis, GERD and IBS.  Subjective:  Patient is 58 year old Caucasian male who has history of cirrhosis secondary to NAFLD, GERD and IBS and is here for scheduled visit. He was last seen in August 2014. He complains of intermittent pain in the right lower abdomen which seem to occur with bending or movement. This pain is described as throbbing; it is not experienced daily and not associated with hematuria or dysuria diarrhea or rectal bleeding. He states he is walking about a mile a day at work. He has lost 14 pounds since his last visit. He states he is not having urgency or diarrhea since he had robotic prostate surgery in October 2013 for carcinoma. He states his diabetes is well controlled. Last A1c was less than 6. He wants me to refer him to an endocrinologist. He will have to discuss this with Dr. Manuella Ghazi, his PCP. He still has not received hepatitis A or B. vaccination. He states he did ask Dr. Manuella Ghazi but it has not materialized. He states his heartburn is well controlled with therapy. He denies dysphagia.   Current Medications: Outpatient Encounter Prescriptions as of 11/09/2013  Medication Sig  . amLODipine (NORVASC) 10 MG tablet Take 5 mg by mouth every morning.   . chlorthalidone (HYGROTON) 25 MG tablet TAKE 1 TABLET DAILY (NEED FOLLOW UP APPOINTMENT)  . dicyclomine (BENTYL) 20 MG tablet Take 20 mg by mouth daily before breakfast.   . docusate sodium (COLACE) 100 MG capsule Take 1 capsule (100 mg total) by mouth 2 (two) times daily.  Marland Kitchen esomeprazole (NEXIUM) 40 MG capsule Take 40 mg by mouth daily before breakfast.   . HUMALOG KWIKPEN 100 UNIT/ML KiwkPen Inject into the skin. Patient states that he uses this as a sliding scale, as directed by the physician  . LANTUS SOLOSTAR 100 UNIT/ML Solostar Pen 6 Units daily before breakfast.   . lisinopril (PRINIVIL,ZESTRIL) 40 MG tablet Take 40 mg by mouth every morning.   . metoprolol (LOPRESSOR) 50 MG tablet  Take 50 mg by mouth daily.   . Multiple Vitamin (MULTI VITAMIN MENS PO) Take by mouth daily.  Marland Kitchen NITROSTAT 0.4 MG SL tablet Place 1 tablet under the tongue every 5 (five) minutes as needed. For chest pains  . potassium chloride (K-DUR) 10 MEQ tablet Take 2 tablets (20 mEq total) by mouth daily.  . [DISCONTINUED] oxyCODONE (ROXICODONE) 5 MG immediate release tablet Take 1 tablet (5 mg total) by mouth every 4 (four) hours as needed for pain.  . [DISCONTINUED] furosemide (LASIX) 40 MG tablet TAKE 1 TABLET EVERY MONDAY, WEDNESDAY, AND FRIDAY     Objective: Blood pressure 130/80, pulse 82, temperature 98.4 F (36.9 C), temperature source Oral, resp. rate 18, height 6' 2"  (1.88 m), weight 331 lb 1.6 oz (150.186 kg). Patient is alert and in no acute distress. Asterixis absent. Conjunctiva is pink. Sclera is nonicteric Oropharyngeal mucosa is normal. No neck masses or thyromegaly noted. Cardiac exam with regular rhythm normal S1 and S2. No murmur or gallop noted. Lungs are clear to auscultation. Abdomen is full. Bowel sounds are normal. Abdomen is soft with mild tenderness in right lower quadrant around laparoscopy scars. Liver edge is firm 5-6 cm below RCM. Clean tip is also palpable. Flanks are not dull.  He has 1+ edema involving both legs  Labs/studies Results: Lab data from 03/31/2013. WBC 5.5, H&H 14.1 and 40.5 and platelet count 95K Sodium 139, potassium 3.1, right 100, CO2 30, BUN 12, creatinine 0.83 and  glucose 126. Bilirubin 0.7, AP 68, AST 46, ALT 40, total protein 7.0, albumin 4.0 and calcium 9.1. AFP was 5.2.  CT from 03/18/2013 reviewed(done by Dr.Borden). It revealed cirrhotic appearing liver and splenomegaly but no evidence of ascites.   Assessment:  #1. Cirrhosis secondary to NAFLD. Fatty liver was diagnosed back in 1992 and cirrhosis over 10 years ago. He remains with compensated disease. He is very lucky causes disease has not progressed rapidly. He must increase physical  activity or exercise and should not slacked off on weight reduction. #2. GERD well controlled with therapy. #3. Right lower quadrant abdominal pain appears to be musculoskeletal. #4. Irritable bowel syndrome. Symptoms well controlled with dicyclomine.   Plan:  Patient will go to lab for CBC, comprehensive chemistry panel and AFP. Upper abdominal ultrasound. Patient needs to exercise at least 4 hours every week. Office visit in 6 months.

## 2013-11-09 NOTE — Patient Instructions (Signed)
Physician will contact you with results of blood work when completed.

## 2013-11-10 LAB — COMPREHENSIVE METABOLIC PANEL
ALK PHOS: 60 U/L (ref 39–117)
ALT: 32 U/L (ref 0–53)
AST: 28 U/L (ref 0–37)
Albumin: 4 g/dL (ref 3.5–5.2)
BILIRUBIN TOTAL: 0.7 mg/dL (ref 0.2–1.2)
BUN: 15 mg/dL (ref 6–23)
CO2: 28 mEq/L (ref 19–32)
CREATININE: 0.83 mg/dL (ref 0.50–1.35)
Calcium: 9.3 mg/dL (ref 8.4–10.5)
Chloride: 104 mEq/L (ref 96–112)
Glucose, Bld: 94 mg/dL (ref 70–99)
Potassium: 3.5 mEq/L (ref 3.5–5.3)
Sodium: 142 mEq/L (ref 135–145)
Total Protein: 6.9 g/dL (ref 6.0–8.3)

## 2013-11-10 LAB — CBC
HEMATOCRIT: 41.3 % (ref 39.0–52.0)
Hemoglobin: 14.2 g/dL (ref 13.0–17.0)
MCH: 28.2 pg (ref 26.0–34.0)
MCHC: 34.4 g/dL (ref 30.0–36.0)
MCV: 82.1 fL (ref 78.0–100.0)
PLATELETS: 104 10*3/uL — AB (ref 150–400)
RBC: 5.03 MIL/uL (ref 4.22–5.81)
RDW: 14.9 % (ref 11.5–15.5)
WBC: 5.5 10*3/uL (ref 4.0–10.5)

## 2013-11-10 LAB — AFP TUMOR MARKER: AFP-Tumor Marker: 3.2 ng/mL (ref 0.0–8.0)

## 2013-11-17 ENCOUNTER — Ambulatory Visit (INDEPENDENT_AMBULATORY_CARE_PROVIDER_SITE_OTHER): Payer: Managed Care, Other (non HMO) | Admitting: Cardiology

## 2013-11-17 ENCOUNTER — Encounter: Payer: Self-pay | Admitting: Cardiology

## 2013-11-17 VITALS — BP 136/81 | HR 69 | Ht 74.0 in | Wt 329.0 lb

## 2013-11-17 DIAGNOSIS — I10 Essential (primary) hypertension: Secondary | ICD-10-CM

## 2013-11-17 DIAGNOSIS — I2510 Atherosclerotic heart disease of native coronary artery without angina pectoris: Secondary | ICD-10-CM

## 2013-11-17 DIAGNOSIS — E785 Hyperlipidemia, unspecified: Secondary | ICD-10-CM

## 2013-11-17 MED ORDER — AMLODIPINE BESYLATE 10 MG PO TABS: 10.0000 mg | ORAL_TABLET | Freq: Every morning | ORAL | Status: DC

## 2013-11-17 NOTE — Patient Instructions (Signed)
Your physician recommends that you schedule a follow-up appointment in: 3 months with Dr. Harl Bowie. This appointment will be scheduled today before you leave.   Your physician has recommended you make the following change in your medication:  Increase: Amlodipine (Norvasc) 10 MG take 1 tablet by mouth once daily  Continue all other medications the same.

## 2013-11-17 NOTE — Progress Notes (Signed)
Clinical Summary Mr. Dymek is a 58 y.o.male last seen by Dr Ron Parker, this is our first visit together. He is seen for the following medical problems.  1. CAD - prior BMS to LCX in 2008, with noted moderate diffuse disease of LAD and RCA that has been treated medically. LVEF by LV gram 60% at that time - Lexiscan MPI 02/2013 with multiple defects and normal wall motion thought most likely artifact. LVEF 50%.   - describes an occasional sharp pain in mid chest with mild fatigue, no other associated symptoms. Can occur with rest or with exertion. No associated pattern. Occurs sporadically, with a few episodes over the last few days. Not positional. Can have some mild pain in midback as well. - denies any DOE  2. Hyperlipidemia - not on statin due to cirrhosis  3. HTN - does not check at home - compliant with meds  4. Cirrhosis - reports history of fatty liver. Followed by GI  Past Medical History  Diagnosis Date  . CAD, multiple vessel     PCI circumflex 2008, diffuse disease of the right and LAD treated medically.,  //   Nuclear 2010 no ischemia   //   nuclear May, 2012 no ischemia  . Mixed dyslipidemia   . Unspecified essential hypertension   . Cirrhosis of liver without mention of alcohol   . Esophageal reflux   . Unspecified diastolic heart failure   . Other chronic nonalcoholic liver disease   . Postsurgical percutaneous transluminal coronary angioplasty status   . Morbid obesity   . Diverticulosis   . IBS (irritable bowel syndrome)   . Dysphagia   . Lower abdominal pain   . Prostate cancer 03/24/12  . Ejection fraction     EF 55%, nuclear, May, 2012  . Contrast media allergy   . 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 Preop cardiovascular exam     Cardiovascular clearance for prostate surgery October, 2013     Allergies  Allergen Reactions  . Iohexol Other (See Comments)    unknown  . Ivp Dye [Iodinated Diagnostic Agents]  Other (See Comments)    Heart feels like its going to explode  . Clopidogrel Bisulfate Rash  . Codeine Palpitations     Current Outpatient Prescriptions  Medication Sig Dispense Refill  . amLODipine (NORVASC) 10 MG tablet Take 5 mg by mouth every morning.       . chlorthalidone (HYGROTON) 25 MG tablet TAKE 1 TABLET DAILY (NEED FOLLOW UP APPOINTMENT)  90 tablet  0  . dicyclomine (BENTYL) 20 MG tablet Take 20 mg by mouth daily before breakfast.       . docusate sodium (COLACE) 100 MG capsule Take 1 capsule (100 mg total) by mouth 2 (two) times daily.  10 capsule  0  . esomeprazole (NEXIUM) 40 MG capsule Take 40 mg by mouth daily before breakfast.       . HUMALOG KWIKPEN 100 UNIT/ML KiwkPen Inject into the skin. Patient states that he uses this as a sliding scale, as directed by the physician      . LANTUS SOLOSTAR 100 UNIT/ML Solostar Pen 6 Units daily before breakfast.       . lisinopril (PRINIVIL,ZESTRIL) 40 MG tablet Take 40 mg by mouth every morning.       . metoprolol (LOPRESSOR) 50 MG tablet Take 50 mg by mouth daily.       . Multiple Vitamin (MULTI  VITAMIN MENS PO) Take by mouth daily.      Marland Kitchen NITROSTAT 0.4 MG SL tablet Place 1 tablet under the tongue every 5 (five) minutes as needed. For chest pains      . potassium chloride (K-DUR) 10 MEQ tablet Take 2 tablets (20 mEq total) by mouth daily.  30 tablet  0   No current facility-administered medications for this visit.     Past Surgical History  Procedure Laterality Date  . Right knee surgery    . Vasectomy    . Cholecystectomy  06/1998  . Colonoscopy  03/03/03  . Upper gastrointestinal endoscopy  02/21/2011    EGD ED  . Upper gastrointestinal endoscopy  03/03/03    TCS  . Two cardiac stents    . Colonoscopy  08/17/2011    Procedure: COLONOSCOPY;  Surgeon: Rogene Houston, MD;  Location: [facility];  Service: Endoscopy;  Laterality: N/A;  1045  . Left rotator    . Back surgery      took cyst out of back  . Robot  assisted laparoscopic radical prostatectomy  06/26/2012    Procedure: ROBOTIC ASSISTED LAPAROSCOPIC RADICAL PROSTATECTOMY LEVEL 3;  Surgeon: Dutch Gray, MD;  Location: [facility];  Service: Urology;  Laterality: N/A;         Allergies  Allergen Reactions  . Iohexol Other (See Comments)    unknown  . Ivp Dye [Iodinated Diagnostic Agents] Other (See Comments)    Heart feels like its going to explode  . Clopidogrel Bisulfate Rash  . Codeine Palpitations      Family History  Problem Relation Age of Onset  . Coronary artery disease      family history  . Heart disease Mother   . Breast cancer Mother   . Heart disease Father   . Breast cancer Sister   . Ovarian cancer Sister   . Heart disease Brother   . Healthy Daughter   . Healthy Daughter   . Healthy Daughter   . Healthy Daughter      Social History Mr. Bracewell reports that he quit smoking about 15 years ago. His smoking use included Cigarettes. He has a 90 pack-year smoking history. He has never used smokeless tobacco. Mr. Knoble reports that he does not drink alcohol.   Review of Systems CONSTITUTIONAL: No weight loss, fever, chills, weakness or fatigue.  HEENT: Eyes: No visual loss, blurred vision, double vision or yellow sclerae.No hearing loss, sneezing, congestion, runny nose or sore throat.  SKIN: No rash or itching.  CARDIOVASCULAR: per HPI RESPIRATORY: No shortness of breath, cough or sputum.  GASTROINTESTINAL: No anorexia, nausea, vomiting or diarrhea. No abdominal pain or blood.  GENITOURINARY: No burning on urination, no polyuria NEUROLOGICAL: No headache, dizziness, syncope, paralysis, ataxia, numbness or tingling in the extremities. No change in bowel or bladder control.  MUSCULOSKELETAL: No muscle, back pain, joint pain or stiffness.  LYMPHATICS: No enlarged nodes. No history of splenectomy.  PSYCHIATRIC: No history of depression or anxiety.  ENDOCRINOLOGIC: No reports of sweating, cold or heat intolerance.  No polyuria or polydipsia.  Marland Kitchen   Physical Examination p 69 bp 136/81 Wt 329 lbs BMI 42 Gen: resting comfortably, no acute distress HEENT: no scleral icterus, pupils equal round and reactive, no palptable cervical adenopathy,  CV: RRR, no m/r/g, no JVD Resp: Clear to auscultation bilaterally GI: abdomen is soft, non-tender, non-distended, normal bowel sounds, no hepatosplenomegaly MSK: extremities are warm, 1+ bilateral edema Skin: warm, no rash Neuro:  no  focal deficits Psych: appropriate affect   Diagnostic Studies 02/2007 Cath FINDINGS: Aortic pressure 131/85 with a mean of 106, left ventricular  pressure is 128/16.  The left mainstem has luminal irregularities but no significant  angiographic stenosis.  The left mainstem bifurcates into the LAD and left circumflex. The LAD  is a large-caliber vessel that courses down and reaches the LV apex.  Proximal portion of the LAD in the area of the first septal perforator  has a diffuse 50% stenosis. There is a diagonal Hermila Millis that arises from  this area. The diagonal is widely patent. The remaining portions of  the mid and distal LAD have diffuse nonobstructive plaque without  significant focal stenosis.  The left circumflex is a large-caliber caliber vessel. It gives off a  large first OM Alazae Crymes that has an 80% stenosis in its proximal portion.  The AV groove circumflex continues down and provides a small left  posterolateral Calie Buttrey. There is no significant angiographic disease in  the AV groove circumflex.  The right coronary artery is diffusely diseased. There are luminal  irregularities in the proximal portion. The midportion in the stented  segment that is ectatic. Distally, there is diffuse disease with 50%  stenosis just before the bifurcation of the PDA and posterior AV  segment. The PDA and posterolateral branches have no significant  angiographic disease  Left ventriculography demonstrates normal LV systolic function with  an  LVEF of 60%. There is no mitral regurgitation.  ASSESSMENT:  1. Severe obtuse marginal 1 stenosis.  2. Moderate diffuse disease in the left anterior descending artery and  right coronary artery.  3. Normal left ventricular systolic function.  4. Successful percutaneous coronary intervention of the left  circumflex with a bare-metal stent.  Will continue aspirin indefinitely. The patient should be on  ticlopidine for 30 days with close monitoring of his CBC.  02/2013 Lexiscan MPI Small partially reversible apical to mid anteroseptal defect. Partially reversible mid anterior defect. Fixed mid to basal inferior wall defect. Normal wall motion, thought to be artifact/noted soft tissue attenuation  11/17/13 clinic EKG NSR,  Assessment and Plan  1. CAD - mild nontypical chest pain, will increase norvasc and follow symptoms. Negative MPI 02/2013. If symptoms progress consider alternative ischemic evaluation.  - continue risk factor modification and secondary prevention  2. Hyperlipidemia - not on statin due to history of fatty liver and cirrhosis  3. HTN - at goal, continue current meds   Follow up 3 months   Arnoldo Lenis, M.D., F.A.C.C.

## 2013-11-25 ENCOUNTER — Telehealth: Payer: Self-pay | Admitting: *Deleted

## 2013-11-25 NOTE — Telephone Encounter (Signed)
Nurse practitioner called back to inform our office that patient refuses to go to the ED for evaluation and wants an appointment to be seen in the office.

## 2013-11-25 NOTE — Telephone Encounter (Signed)
Per Nurse Practitioner, she was called to check patient's symptoms while he was at work today for chest tightness with sob for several days. Patient also c/o coughing. Patient has not used his nitroglycerin. Nurse advised nurse practitioner to have patient go to the ED.

## 2013-11-25 NOTE — Telephone Encounter (Signed)
I agree, with that collection of symptoms he needs to be seen in ER   Carlyle Dolly MD

## 2013-11-27 ENCOUNTER — Ambulatory Visit (INDEPENDENT_AMBULATORY_CARE_PROVIDER_SITE_OTHER): Payer: Managed Care, Other (non HMO) | Admitting: Cardiology

## 2013-11-27 ENCOUNTER — Encounter: Payer: Self-pay | Admitting: Cardiology

## 2013-11-27 ENCOUNTER — Telehealth: Payer: Self-pay | Admitting: Cardiology

## 2013-11-27 VITALS — BP 115/74 | HR 64 | Ht 74.0 in | Wt 326.0 lb

## 2013-11-27 DIAGNOSIS — R079 Chest pain, unspecified: Secondary | ICD-10-CM

## 2013-11-27 DIAGNOSIS — I2510 Atherosclerotic heart disease of native coronary artery without angina pectoris: Secondary | ICD-10-CM

## 2013-11-27 DIAGNOSIS — I10 Essential (primary) hypertension: Secondary | ICD-10-CM

## 2013-11-27 DIAGNOSIS — E785 Hyperlipidemia, unspecified: Secondary | ICD-10-CM

## 2013-11-27 MED ORDER — DIPHENHYDRAMINE HCL 25 MG PO CAPS: ORAL_CAPSULE | ORAL | Status: DC

## 2013-11-27 MED ORDER — METOPROLOL TARTRATE 50 MG PO TABS: 50.0000 mg | ORAL_TABLET | Freq: Two times a day (BID) | ORAL | Status: DC

## 2013-11-27 MED ORDER — RANITIDINE HCL 150 MG PO TABS: ORAL_TABLET | ORAL | Status: DC

## 2013-11-27 MED ORDER — PREDNISONE 20 MG PO TABS: ORAL_TABLET | ORAL | Status: DC

## 2013-11-27 NOTE — Telephone Encounter (Signed)
Left Heart Cath with Coronary angiography scheduled for monday 12-07-13 @ 10:30 with Dr. Martinique DX: chest pain Checking percert Christella Scheuermann

## 2013-11-27 NOTE — Progress Notes (Signed)
Clinical Summary Billy Davis is a 58 y.o.male seen today for follow up of the following medical problems.  1. CAD  - prior BMS to LCX in 2008, with noted moderate diffuse disease of LAD and RCA that has been treated medically. LVEF by LV gram 60% at that time  - Lexiscan MPI 02/2013 with multiple defects and normal wall motion thought most likely artifact. LVEF 50%.   - recently has had intermittent episodes of chest pain. Last visit increased his norvasc, has not noted any signficant change in symptoms.  - Had repeat episode Wednesday while sitting at desk. Tightness in midchest 2/10, no other associated symptoms. Lasts approx 30-40 minutes, on and off throughout the day. Nothing made worst. Did not take NG. Similar to prior symptoms.   2. Hyperlipidemia  - not on statin due to cirrhosis per notes  3. HTN  - does not check at home  - compliant with meds   4. Cirrhosis  - reports history of fatty liver. Followed by GI   Past Medical History  Diagnosis Date  . CAD, multiple vessel     PCI circumflex 2008, diffuse disease of the right and LAD treated medically.,  //   Nuclear 2010 no ischemia   //   nuclear May, 2012 no ischemia  . Mixed dyslipidemia   . Unspecified essential hypertension   . Cirrhosis of liver without mention of alcohol   . Esophageal reflux   . Unspecified diastolic heart failure   . Other chronic nonalcoholic liver disease   . Postsurgical percutaneous transluminal coronary angioplasty status   . Morbid obesity   . Diverticulosis   . IBS (irritable bowel syndrome)   . Dysphagia   . Lower abdominal pain   . Prostate cancer 03/24/12  . Ejection fraction     EF 55%, nuclear, May, 2012  . Contrast media allergy   . 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 Preop cardiovascular exam     Cardiovascular clearance for prostate surgery October, 2013     Allergies  Allergen Reactions  . Iohexol Other (See Comments)   unknown  . Ivp Dye [Iodinated Diagnostic Agents] Other (See Comments)    Heart feels like its going to explode  . Clopidogrel Bisulfate Rash  . Codeine Palpitations     Current Outpatient Prescriptions  Medication Sig Dispense Refill  . amLODipine (NORVASC) 10 MG tablet Take 1 tablet (10 mg total) by mouth every morning.  90 tablet  3  . aspirin EC 81 MG tablet Take 81 mg by mouth daily.      . chlorthalidone (HYGROTON) 25 MG tablet TAKE 1 TABLET DAILY      . dicyclomine (BENTYL) 10 MG capsule Take 10 mg by mouth daily.      Marland Kitchen docusate sodium (COLACE) 100 MG capsule Take 1 capsule (100 mg total) by mouth 2 (two) times daily.  10 capsule  0  . esomeprazole (NEXIUM) 40 MG capsule Take 40 mg by mouth daily before breakfast.       . HUMALOG KWIKPEN 100 UNIT/ML KiwkPen Inject into the skin. Patient states that he uses this as a sliding scale, as directed by the physician      . LANTUS SOLOSTAR 100 UNIT/ML Solostar Pen 6 Units daily before breakfast.       . lisinopril (PRINIVIL,ZESTRIL) 40 MG tablet Take 40 mg by mouth every morning.       Marland Kitchen  metoprolol (LOPRESSOR) 50 MG tablet Take 50 mg by mouth daily.       . Multiple Vitamin (MULTI VITAMIN MENS PO) Take 1 tablet by mouth daily.       Marland Kitchen NITROSTAT 0.4 MG SL tablet Place 1 tablet under the tongue every 5 (five) minutes as needed. For chest pains       No current facility-administered medications for this visit.     Past Surgical History  Procedure Laterality Date  . Right knee surgery    . Vasectomy    . Cholecystectomy  06/1998  . Colonoscopy  03/03/03  . Upper gastrointestinal endoscopy  02/21/2011    EGD ED  . Upper gastrointestinal endoscopy  03/03/03    TCS  . Two cardiac stents    . Colonoscopy  08/17/2011    Procedure: COLONOSCOPY;  Surgeon: Rogene Houston, MD;  Location: [facility];  Service: Endoscopy;  Laterality: N/A;  1045  . Left rotator    . Back surgery      took cyst out of back  . Robot assisted laparoscopic  radical prostatectomy  06/26/2012    Procedure: ROBOTIC ASSISTED LAPAROSCOPIC RADICAL PROSTATECTOMY LEVEL 3;  Surgeon: Dutch Gray, MD;  Location: [facility];  Service: Urology;  Laterality: N/A;         Allergies  Allergen Reactions  . Iohexol Other (See Comments)    unknown  . Ivp Dye [Iodinated Diagnostic Agents] Other (See Comments)    Heart feels like its going to explode  . Clopidogrel Bisulfate Rash  . Codeine Palpitations      Family History  Problem Relation Age of Onset  . Coronary artery disease      family history  . Heart disease Mother   . Breast cancer Mother   . Heart disease Father   . Breast cancer Sister   . Ovarian cancer Sister   . Heart disease Brother   . Healthy Daughter   . Healthy Daughter   . Healthy Daughter   . Healthy Daughter      Social History Billy Davis reports that he quit smoking about 15 years ago. His smoking use included Cigarettes. He has a 90 pack-year smoking history. He has never used smokeless tobacco. Billy Davis reports that he does not drink alcohol.   Review of Systems CONSTITUTIONAL: No weight loss, fever, chills, weakness or fatigue.  HEENT: Eyes: No visual loss, blurred vision, double vision or yellow sclerae.No hearing loss, sneezing, congestion, runny nose or sore throat.  SKIN: No rash or itching.  CARDIOVASCULAR: per HPI RESPIRATORY: No shortness of breath, cough or sputum.  GASTROINTESTINAL: No anorexia, nausea, vomiting or diarrhea. No abdominal pain or blood.  GENITOURINARY: No burning on urination, no polyuria NEUROLOGICAL: No headache, dizziness, syncope, paralysis, ataxia, numbness or tingling in the extremities. No change in bowel or bladder control.  MUSCULOSKELETAL: No muscle, back pain, joint pain or stiffness.  LYMPHATICS: No enlarged nodes. No history of splenectomy.  PSYCHIATRIC: No history of depression or anxiety.  ENDOCRINOLOGIC: No reports of sweating, cold or heat intolerance. No polyuria or  polydipsia.  Marland Kitchen   Physical Examination p 64 bp 115/74 Wt 326 lbs BMI 42 Gen: resting comfortably, no acute distress HEENT: no scleral icterus, pupils equal round and reactive, no palptable cervical adenopathy,  CV: RRR, no m/r/g, no JVD, no carotid bruits Resp: Clear to auscultation bilaterally GI: abdomen is soft, non-tender, non-distended, normal bowel sounds, no hepatosplenomegaly MSK: extremities are warm, 1+ bilateral edema  Skin:  warm, no rash Neuro:  no focal deficits Psych: appropriate affect   Diagnostic Studies 02/2007 Cath  FINDINGS: Aortic pressure 131/85 with a mean of 106, left ventricular  pressure is 128/16.  The left mainstem has luminal irregularities but no significant  angiographic stenosis.  The left mainstem bifurcates into the LAD and left circumflex. The LAD  is a large-caliber vessel that courses down and reaches the LV apex.  Proximal portion of the LAD in the area of the first septal perforator  has a diffuse 50% stenosis. There is a diagonal Romon Devereux that arises from  this area. The diagonal is widely patent. The remaining portions of  the mid and distal LAD have diffuse nonobstructive plaque without  significant focal stenosis.  The left circumflex is a large-caliber caliber vessel. It gives off a  large first OM Kilani Joffe that has an 80% stenosis in its proximal portion.  The AV groove circumflex continues down and provides a small left  posterolateral Evagelia Knack. There is no significant angiographic disease in  the AV groove circumflex.  The right coronary artery is diffusely diseased. There are luminal  irregularities in the proximal portion. The midportion in the stented  segment that is ectatic. Distally, there is diffuse disease with 50%  stenosis just before the bifurcation of the PDA and posterior AV  segment. The PDA and posterolateral branches have no significant  angiographic disease  Left ventriculography demonstrates normal LV systolic function  with an  LVEF of 60%. There is no mitral regurgitation.  ASSESSMENT:  1. Severe obtuse marginal 1 stenosis.  2. Moderate diffuse disease in the left anterior descending artery and  right coronary artery.  3. Normal left ventricular systolic function.  4. Successful percutaneous coronary intervention of the left  circumflex with a bare-metal stent.  Will continue aspirin indefinitely. The patient should be on  ticlopidine for 30 days with close monitoring of his CBC.   02/2013 Lexiscan MPI  Small partially reversible apical to mid anteroseptal defect. Partially reversible mid anterior defect. Fixed mid to basal inferior wall defect. Normal wall motion, thought to be artifact/noted soft tissue attenuation   11/17/13 clinic EKG  NSR,     Assessment and Plan  1. CAD  - continuing to have chest pain despite being on 2 antianginals. MPI had multiple defects thought to be artifact but cannot rule out true disease from this study alone. Known CAD with prior stent in 2008, at that time also noted moderate diffuse disease in both the LAD and RCA.  - will refer for cath to better define his coronary anatomy   2. Hyperlipidemia  - not on statin due to history of fatty liver and cirrhosis   3. HTN  - at goal, continue current meds    Follow up after cath   Arnoldo Lenis, M.D., F.A.C.C.

## 2013-11-27 NOTE — Patient Instructions (Signed)
Your physician has requested that you have a cardiac catheterization. Cardiac catheterization is used to diagnose and/or treat various heart conditions. Doctors may recommend this procedure for a number of different reasons. The most common reason is to evaluate chest pain. Chest pain can be a symptom of coronary artery disease (CAD), and cardiac catheterization can show whether plaque is narrowing or blocking your heart's arteries. This procedure is also used to evaluate the valves, as well as measure the blood flow and oxygen levels in different parts of your heart. For further information please visit [email or web address]. Please follow instruction sheet, as given.  A follow up appointment will be given to you when you are discharged from hospital.   Your physician has recommended you make the following change in your medication:  Metoprolol 50 MG take 1 tablet by mouth twice daily  Continue all other medications the same.   You will need to take Zantac 150 MG, Prednisone 20 MG (3 tablets) , and Benadryl 25 MG at 6 PM the night before your procedure and at 12 Midnight and 6 AM the morning before your procedure.

## 2013-12-02 ENCOUNTER — Encounter (HOSPITAL_COMMUNITY): Payer: Self-pay | Admitting: Pharmacy Technician

## 2013-12-04 ENCOUNTER — Ambulatory Visit (HOSPITAL_COMMUNITY)
Admission: RE | Admit: 2013-12-04 | Discharge: 2013-12-04 | Disposition: A | Discharge status: Home / Self Care | Payer: Managed Care, Other (non HMO) | Source: Ambulatory Visit | Attending: Internal Medicine | Admitting: Internal Medicine

## 2013-12-04 DIAGNOSIS — R161 Splenomegaly, not elsewhere classified: Secondary | ICD-10-CM | POA: Insufficient documentation

## 2013-12-04 DIAGNOSIS — K7460 Unspecified cirrhosis of liver: Secondary | ICD-10-CM | POA: Insufficient documentation

## 2013-12-04 NOTE — Telephone Encounter (Signed)
Approved and in epic

## 2013-12-07 ENCOUNTER — Ambulatory Visit (HOSPITAL_COMMUNITY)
Admission: RE | Admit: 2013-12-07 | Discharge: 2013-12-07 | Disposition: A | Discharge status: Home / Self Care | Payer: Managed Care, Other (non HMO) | Source: Ambulatory Visit | Attending: Cardiology | Admitting: Cardiology

## 2013-12-07 ENCOUNTER — Other Ambulatory Visit: Payer: Self-pay | Admitting: Cardiology

## 2013-12-07 ENCOUNTER — Encounter (HOSPITAL_COMMUNITY)
Admission: RE | Disposition: A | Discharge status: Home / Self Care | Payer: Self-pay | Source: Ambulatory Visit | Attending: Cardiology

## 2013-12-07 DIAGNOSIS — K589 Irritable bowel syndrome without diarrhea: Secondary | ICD-10-CM | POA: Insufficient documentation

## 2013-12-07 DIAGNOSIS — I10 Essential (primary) hypertension: Secondary | ICD-10-CM | POA: Insufficient documentation

## 2013-12-07 DIAGNOSIS — R1310 Dysphagia, unspecified: Secondary | ICD-10-CM | POA: Insufficient documentation

## 2013-12-07 DIAGNOSIS — K219 Gastro-esophageal reflux disease without esophagitis: Secondary | ICD-10-CM | POA: Insufficient documentation

## 2013-12-07 DIAGNOSIS — K5730 Diverticulosis of large intestine without perforation or abscess without bleeding: Secondary | ICD-10-CM | POA: Insufficient documentation

## 2013-12-07 DIAGNOSIS — I2510 Atherosclerotic heart disease of native coronary artery without angina pectoris: Secondary | ICD-10-CM | POA: Insufficient documentation

## 2013-12-07 DIAGNOSIS — E782 Mixed hyperlipidemia: Secondary | ICD-10-CM | POA: Insufficient documentation

## 2013-12-07 DIAGNOSIS — I5030 Unspecified diastolic (congestive) heart failure: Secondary | ICD-10-CM | POA: Insufficient documentation

## 2013-12-07 DIAGNOSIS — Z9861 Coronary angioplasty status: Secondary | ICD-10-CM | POA: Insufficient documentation

## 2013-12-07 DIAGNOSIS — Z7982 Long term (current) use of aspirin: Secondary | ICD-10-CM | POA: Insufficient documentation

## 2013-12-07 DIAGNOSIS — Z87891 Personal history of nicotine dependence: Secondary | ICD-10-CM | POA: Insufficient documentation

## 2013-12-07 DIAGNOSIS — Z8546 Personal history of malignant neoplasm of prostate: Secondary | ICD-10-CM | POA: Insufficient documentation

## 2013-12-07 DIAGNOSIS — K7689 Other specified diseases of liver: Secondary | ICD-10-CM | POA: Insufficient documentation

## 2013-12-07 HISTORY — PX: LEFT HEART CATHETERIZATION WITH CORONARY ANGIOGRAM: SHX5451

## 2013-12-07 LAB — CBC
HCT: 43.5 % (ref 39.0–52.0)
HEMOGLOBIN: 15.4 g/dL (ref 13.0–17.0)
MCH: 29.1 pg (ref 26.0–34.0)
MCHC: 35.4 g/dL (ref 30.0–36.0)
MCV: 82.1 fL (ref 78.0–100.0)
PLATELETS: 125 10*3/uL — AB (ref 150–400)
RBC: 5.3 MIL/uL (ref 4.22–5.81)
RDW: 13.6 % (ref 11.5–15.5)
WBC: 7.7 10*3/uL (ref 4.0–10.5)

## 2013-12-07 LAB — BASIC METABOLIC PANEL
BUN: 18 mg/dL (ref 6–23)
CHLORIDE: 102 meq/L (ref 96–112)
CO2: 23 meq/L (ref 19–32)
Calcium: 9.3 mg/dL (ref 8.4–10.5)
Creatinine, Ser: 0.78 mg/dL (ref 0.50–1.35)
GFR calc non Af Amer: >90 mL/min (ref >90)
Glucose, Bld: 203 mg/dL — ABNORMAL HIGH (ref 70–99)
POTASSIUM: 3.6 meq/L — AB (ref 3.7–5.3)
SODIUM: 143 meq/L (ref 137–147)

## 2013-12-07 LAB — PROTIME-INR
INR: 1.21 (ref 0.00–1.49)
Prothrombin Time: 15 seconds (ref 11.6–15.2)

## 2013-12-07 LAB — GLUCOSE, CAPILLARY: GLUCOSE-CAPILLARY: 198 mg/dL — AB (ref 70–99)

## 2013-12-07 SURGERY — LEFT HEART CATHETERIZATION WITH CORONARY ANGIOGRAM
Anesthesia: LOCAL

## 2013-12-07 MED ORDER — MIDAZOLAM HCL 2 MG/2ML IJ SOLN
INTRAMUSCULAR | Status: AC
  Filled 2013-12-07: qty 2

## 2013-12-07 MED ORDER — HEPARIN (PORCINE) IN NACL 2-0.9 UNIT/ML-% IJ SOLN
INTRAMUSCULAR | Status: AC
  Filled 2013-12-07: qty 500

## 2013-12-07 MED ORDER — FENTANYL CITRATE 0.05 MG/ML IJ SOLN
INTRAMUSCULAR | Status: AC
  Filled 2013-12-07: qty 2

## 2013-12-07 MED ORDER — VERAPAMIL HCL 2.5 MG/ML IV SOLN
INTRAVENOUS | Status: AC
  Filled 2013-12-07: qty 2

## 2013-12-07 MED ORDER — HEPARIN (PORCINE) IN NACL 2-0.9 UNIT/ML-% IJ SOLN
INTRAMUSCULAR | Status: AC
  Filled 2013-12-07: qty 1000

## 2013-12-07 MED ORDER — SODIUM CHLORIDE 0.9 % IV SOLN: 1.0000 mL/kg/h | INTRAVENOUS | Status: DC

## 2013-12-07 MED ORDER — LIDOCAINE HCL (PF) 1 % IJ SOLN
INTRAMUSCULAR | Status: AC
  Filled 2013-12-07: qty 30

## 2013-12-07 MED ORDER — HEPARIN SODIUM (PORCINE) 1000 UNIT/ML IJ SOLN
INTRAMUSCULAR | Status: AC
  Filled 2013-12-07: qty 1

## 2013-12-07 MED ORDER — SODIUM CHLORIDE 0.9 % IV SOLN
INTRAVENOUS | Status: DC
  Administered 2013-12-07: 08:00:00 via INTRAVENOUS

## 2013-12-07 MED ORDER — SODIUM CHLORIDE 0.9 % IJ SOLN: 3.0000 mL | INTRAMUSCULAR | Status: DC | PRN

## 2013-12-07 MED ORDER — SODIUM CHLORIDE 0.9 % IJ SOLN: 3.0000 mL | Freq: Two times a day (BID) | INTRAMUSCULAR | Status: DC

## 2013-12-07 MED ORDER — SODIUM CHLORIDE 0.9 % IV SOLN: 250.0000 mL | INTRAVENOUS | Status: DC | PRN

## 2013-12-07 MED ORDER — ASPIRIN 81 MG PO CHEW
81.0000 mg | CHEWABLE_TABLET | ORAL | Status: AC
  Administered 2013-12-07: 81 mg via ORAL
  Filled 2013-12-07: qty 1

## 2013-12-07 NOTE — Progress Notes (Signed)
D/c home with wife; right radial site unremarkable; no c/o

## 2013-12-07 NOTE — Interval H&P Note (Signed)
History and Physical Interval Note:  12/07/2013 9:26 AM  Billy Davis  has presented today for surgery, with the diagnosis of CP  The various methods of treatment have been discussed with the patient and family. After consideration of risks, benefits and other options for treatment, the patient has consented to  Procedure(s): LEFT HEART CATHETERIZATION WITH CORONARY ANGIOGRAM (N/A) as a surgical intervention .  The patient's history has been reviewed, patient examined, no change in status, stable for surgery.  I have reviewed the patient's chart and labs.  Questions were answered to the patient's satisfaction.    Cath Lab Visit (complete for each Cath Lab visit)  Clinical Evaluation Leading to the Procedure:   ACS: no  Non-ACS:    Anginal Classification: CCS IV  Anti-ischemic medical therapy: Maximal Therapy (2 or more classes of medications)  Non-Invasive Test Results: No non-invasive testing performed  Prior CABG: No previous CABG       Ander Slade [facility] 12/07/2013 9:26 AM

## 2013-12-07 NOTE — H&P (View-Only) (Signed)
Clinical Summary Mr. Gilreath is a 58 y.o.male seen today for follow up of the following medical problems.  1. CAD  - prior BMS to LCX in 2008, with noted moderate diffuse disease of LAD and RCA that has been treated medically. LVEF by LV gram 60% at that time  - Lexiscan MPI 02/2013 with multiple defects and normal wall motion thought most likely artifact. LVEF 50%.   - recently has had intermittent episodes of chest pain. Last visit increased his norvasc, has not noted any signficant change in symptoms.  - Had repeat episode Wednesday while sitting at desk. Tightness in midchest 2/10, no other associated symptoms. Lasts approx 30-40 minutes, on and off throughout the day. Nothing made worst. Did not take NG. Similar to prior symptoms.   2. Hyperlipidemia  - not on statin due to cirrhosis per notes  3. HTN  - does not check at home  - compliant with meds   4. Cirrhosis  - reports history of fatty liver. Followed by GI   Past Medical History  Diagnosis Date  . CAD, multiple vessel     PCI circumflex 2008, diffuse disease of the right and LAD treated medically.,  //   Nuclear 2010 no ischemia   //   nuclear May, 2012 no ischemia  . Mixed dyslipidemia   . Unspecified essential hypertension   . Cirrhosis of liver without mention of alcohol   . Esophageal reflux   . Unspecified diastolic heart failure   . Other chronic nonalcoholic liver disease   . Postsurgical percutaneous transluminal coronary angioplasty status   . Morbid obesity   . Diverticulosis   . IBS (irritable bowel syndrome)   . Dysphagia   . Lower abdominal pain   . Prostate cancer 03/24/12  . Ejection fraction     EF 55%, nuclear, May, 2012  . Contrast media allergy   . 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 Preop cardiovascular exam     Cardiovascular clearance for prostate surgery October, 2013     Allergies  Allergen Reactions  . Iohexol Other (See Comments)   unknown  . Ivp Dye [Iodinated Diagnostic Agents] Other (See Comments)    Heart feels like its going to explode  . Clopidogrel Bisulfate Rash  . Codeine Palpitations     Current Outpatient Prescriptions  Medication Sig Dispense Refill  . amLODipine (NORVASC) 10 MG tablet Take 1 tablet (10 mg total) by mouth every morning.  90 tablet  3  . aspirin EC 81 MG tablet Take 81 mg by mouth daily.      . chlorthalidone (HYGROTON) 25 MG tablet TAKE 1 TABLET DAILY      . dicyclomine (BENTYL) 10 MG capsule Take 10 mg by mouth daily.      Marland Kitchen docusate sodium (COLACE) 100 MG capsule Take 1 capsule (100 mg total) by mouth 2 (two) times daily.  10 capsule  0  . esomeprazole (NEXIUM) 40 MG capsule Take 40 mg by mouth daily before breakfast.       . HUMALOG KWIKPEN 100 UNIT/ML KiwkPen Inject into the skin. Patient states that he uses this as a sliding scale, as directed by the physician      . LANTUS SOLOSTAR 100 UNIT/ML Solostar Pen 6 Units daily before breakfast.       . lisinopril (PRINIVIL,ZESTRIL) 40 MG tablet Take 40 mg by mouth every morning.       Marland Kitchen  metoprolol (LOPRESSOR) 50 MG tablet Take 50 mg by mouth daily.       . Multiple Vitamin (MULTI VITAMIN MENS PO) Take 1 tablet by mouth daily.       Marland Kitchen NITROSTAT 0.4 MG SL tablet Place 1 tablet under the tongue every 5 (five) minutes as needed. For chest pains       No current facility-administered medications for this visit.     Past Surgical History  Procedure Laterality Date  . Right knee surgery    . Vasectomy    . Cholecystectomy  06/1998  . Colonoscopy  03/03/03  . Upper gastrointestinal endoscopy  02/21/2011    EGD ED  . Upper gastrointestinal endoscopy  03/03/03    TCS  . Two cardiac stents    . Colonoscopy  08/17/2011    Procedure: COLONOSCOPY;  Surgeon: Rogene Houston, MD;  Location: [facility];  Service: Endoscopy;  Laterality: N/A;  1045  . Left rotator    . Back surgery      took cyst out of back  . Robot assisted laparoscopic  radical prostatectomy  06/26/2012    Procedure: ROBOTIC ASSISTED LAPAROSCOPIC RADICAL PROSTATECTOMY LEVEL 3;  Surgeon: Dutch Gray, MD;  Location: [facility];  Service: Urology;  Laterality: N/A;         Allergies  Allergen Reactions  . Iohexol Other (See Comments)    unknown  . Ivp Dye [Iodinated Diagnostic Agents] Other (See Comments)    Heart feels like its going to explode  . Clopidogrel Bisulfate Rash  . Codeine Palpitations      Family History  Problem Relation Age of Onset  . Coronary artery disease      family history  . Heart disease Mother   . Breast cancer Mother   . Heart disease Father   . Breast cancer Sister   . Ovarian cancer Sister   . Heart disease Brother   . Healthy Daughter   . Healthy Daughter   . Healthy Daughter   . Healthy Daughter      Social History Mr. Shear reports that he quit smoking about 15 years ago. His smoking use included Cigarettes. He has a 90 pack-year smoking history. He has never used smokeless tobacco. Mr. Vanwagner reports that he does not drink alcohol.   Review of Systems CONSTITUTIONAL: No weight loss, fever, chills, weakness or fatigue.  HEENT: Eyes: No visual loss, blurred vision, double vision or yellow sclerae.No hearing loss, sneezing, congestion, runny nose or sore throat.  SKIN: No rash or itching.  CARDIOVASCULAR: per HPI RESPIRATORY: No shortness of breath, cough or sputum.  GASTROINTESTINAL: No anorexia, nausea, vomiting or diarrhea. No abdominal pain or blood.  GENITOURINARY: No burning on urination, no polyuria NEUROLOGICAL: No headache, dizziness, syncope, paralysis, ataxia, numbness or tingling in the extremities. No change in bowel or bladder control.  MUSCULOSKELETAL: No muscle, back pain, joint pain or stiffness.  LYMPHATICS: No enlarged nodes. No history of splenectomy.  PSYCHIATRIC: No history of depression or anxiety.  ENDOCRINOLOGIC: No reports of sweating, cold or heat intolerance. No polyuria or  polydipsia.  Marland Kitchen   Physical Examination p 64 bp 115/74 Wt 326 lbs BMI 42 Gen: resting comfortably, no acute distress HEENT: no scleral icterus, pupils equal round and reactive, no palptable cervical adenopathy,  CV: RRR, no m/r/g, no JVD, no carotid bruits Resp: Clear to auscultation bilaterally GI: abdomen is soft, non-tender, non-distended, normal bowel sounds, no hepatosplenomegaly MSK: extremities are warm, 1+ bilateral edema  Skin:  warm, no rash Neuro:  no focal deficits Psych: appropriate affect   Diagnostic Studies 02/2007 Cath  FINDINGS: Aortic pressure 131/85 with a mean of 106, left ventricular  pressure is 128/16.  The left mainstem has luminal irregularities but no significant  angiographic stenosis.  The left mainstem bifurcates into the LAD and left circumflex. The LAD  is a large-caliber vessel that courses down and reaches the LV apex.  Proximal portion of the LAD in the area of the first septal perforator  has a diffuse 50% stenosis. There is a diagonal Stavroula Rohde that arises from  this area. The diagonal is widely patent. The remaining portions of  the mid and distal LAD have diffuse nonobstructive plaque without  significant focal stenosis.  The left circumflex is a large-caliber caliber vessel. It gives off a  large first OM Pape Parson that has an 80% stenosis in its proximal portion.  The AV groove circumflex continues down and provides a small left  posterolateral Shavonna Corella. There is no significant angiographic disease in  the AV groove circumflex.  The right coronary artery is diffusely diseased. There are luminal  irregularities in the proximal portion. The midportion in the stented  segment that is ectatic. Distally, there is diffuse disease with 50%  stenosis just before the bifurcation of the PDA and posterior AV  segment. The PDA and posterolateral branches have no significant  angiographic disease  Left ventriculography demonstrates normal LV systolic function  with an  LVEF of 60%. There is no mitral regurgitation.  ASSESSMENT:  1. Severe obtuse marginal 1 stenosis.  2. Moderate diffuse disease in the left anterior descending artery and  right coronary artery.  3. Normal left ventricular systolic function.  4. Successful percutaneous coronary intervention of the left  circumflex with a bare-metal stent.  Will continue aspirin indefinitely. The patient should be on  ticlopidine for 30 days with close monitoring of his CBC.   02/2013 Lexiscan MPI  Small partially reversible apical to mid anteroseptal defect. Partially reversible mid anterior defect. Fixed mid to basal inferior wall defect. Normal wall motion, thought to be artifact/noted soft tissue attenuation   11/17/13 clinic EKG  NSR,     Assessment and Plan  1. CAD  - continuing to have chest pain despite being on 2 antianginals. MPI had multiple defects thought to be artifact but cannot rule out true disease from this study alone. Known CAD with prior stent in 2008, at that time also noted moderate diffuse disease in both the LAD and RCA.  - will refer for cath to better define his coronary anatomy   2. Hyperlipidemia  - not on statin due to history of fatty liver and cirrhosis   3. HTN  - at goal, continue current meds    Follow up after cath   Arnoldo Lenis, M.D., F.A.C.C.

## 2013-12-07 NOTE — CV Procedure (Signed)
    Cardiac Catheterization Procedure Note  Name: Billy Davis MRN: [id number] DOB: August 29, 1955  Procedure: Left Heart Cath, Selective Coronary Angiography, LV angiography  Indication: 58 yo WM with history of CAD s/p stenting of the OM 1 in 2008 with a BMS presents with recent symptoms of chest pain.   Procedural Details: The right wrist was prepped, draped, and anesthetized with 1% lidocaine. Using the modified Seldinger technique, a 5 French sheath was introduced into the right radial artery. 3 mg of verapamil was administered through the sheath, weight-based unfractionated heparin was administered intravenously. Standard Judkins catheters were used for selective coronary angiography and left ventriculography. Catheter exchanges were performed over an exchange length guidewire. There were no immediate procedural complications. A TR band was used for radial hemostasis at the completion of the procedure.  The patient was transferred to the post catheterization recovery area for further monitoring.  Procedural Findings: Hemodynamics: AO 110/62 mean 79 mm Hg LV 113/12 mm Hg  Coronary angiography: Coronary dominance: right  Left mainstem: Normal  Left anterior descending (LAD): 30% disease in the proximal vessel. The first diagonal is normal.  Left circumflex (LCx): The LCx gives rise to a large OM 1, a small OM2, and terminates in a posterolateral branch. The stent in the OM 1 is widely patent. No other significant disease in the LCx.  Right coronary artery (RCA): Large vessel. There is 30% disease in the proximal vessel, at the crux, and in the distal RCA.  Left ventriculography: Left ventricular systolic function is normal, LVEF is estimated at 55-65%, there is no significant mitral regurgitation   Final Conclusions:   1. Nonobstructive CAD. The prior stent in OM1 is widely patent. 2. Normal LV function.   Recommendations: Continue medical therapy.  Ander Slade  [facility] 12/07/2013, 9:45 AM

## 2013-12-07 NOTE — Discharge Instructions (Signed)
Radial Site Care Refer to this sheet in the next few weeks. These instructions provide you with information on caring for yourself after your procedure. Your caregiver may also give you more specific instructions. Your treatment has been planned according to current medical practices, but problems sometimes occur. Call your caregiver if you have any problems or questions after your procedure. HOME CARE INSTRUCTIONS  You may shower the day after the procedure.Remove the bandage (dressing) and gently wash the site with plain soap and water.Gently pat the site dry.  Do not apply powder or lotion to the site.  Do not submerge the affected site in water for 3 to 5 days.  Inspect the site at least twice daily.  Do not flex or bend the affected arm for 24 hours.  No lifting over 5 pounds (2.3 kg) for 5 days after your procedure.  Do not drive home if you are discharged the same day of the procedure. Have someone else drive you.  You may drive 24 hours after the procedure unless otherwise instructed by your caregiver.  Do not operate machinery or power tools for 24 hours.  A responsible adult should be with you for the first 24 hours after you arrive home. What to expect:  Any bruising will usually fade within 1 to 2 weeks.  Blood that collects in the tissue (hematoma) may be painful to the touch. It should usually decrease in size and tenderness within 1 to 2 weeks. SEEK IMMEDIATE MEDICAL CARE IF:  You have unusual pain at the radial site.  You have redness, warmth, swelling, or pain at the radial site.  You have drainage (other than a small amount of blood on the dressing).  You have chills.  You have a fever or persistent symptoms for more than 72 hours.  You have a fever and your symptoms suddenly get worse.  Your arm becomes pale, cool, tingly, or numb.  You have heavy bleeding from the site. Hold pressure on the site. Document Released: 09/15/2010 Document Revised:  11/05/2011 Document Reviewed: 09/15/2010 [employer] Patient Information 2014 [place], [place].

## 2013-12-22 ENCOUNTER — Other Ambulatory Visit: Payer: Self-pay | Admitting: Urology

## 2013-12-22 DIAGNOSIS — N289 Disorder of kidney and ureter, unspecified: Secondary | ICD-10-CM

## 2013-12-28 ENCOUNTER — Ambulatory Visit (INDEPENDENT_AMBULATORY_CARE_PROVIDER_SITE_OTHER): Payer: Managed Care, Other (non HMO) | Admitting: Cardiology

## 2013-12-28 ENCOUNTER — Encounter: Payer: Self-pay | Admitting: Cardiology

## 2013-12-28 VITALS — BP 122/76 | HR 57 | Ht 74.0 in | Wt 326.0 lb

## 2013-12-28 DIAGNOSIS — E785 Hyperlipidemia, unspecified: Secondary | ICD-10-CM

## 2013-12-28 DIAGNOSIS — I2510 Atherosclerotic heart disease of native coronary artery without angina pectoris: Secondary | ICD-10-CM

## 2013-12-28 DIAGNOSIS — I10 Essential (primary) hypertension: Secondary | ICD-10-CM

## 2013-12-28 NOTE — Patient Instructions (Signed)
Continue all current medications. Your physician wants you to follow up in: 6 months.  You will receive a reminder letter in the mail one-two months in advance.  If you don't receive a letter, please call our office to schedule the follow up appointment   

## 2013-12-28 NOTE — Progress Notes (Signed)
Clinical Summary [name] is a 58 y.o.male seen today for follow up of the following medical problems   1. CAD  - prior BMS to LCX in 2008, with noted moderate diffuse disease of LAD and RCA that has been treated medically. LVEF by LV gram 60% at that time  - Lexiscan MPI 02/2013 with multiple defects and normal wall motion thought most likely artifact. LVEF 50%.  - recently has had intermittent episodes of chest pain. Referred for cath last visit for further evaluation - cath 12/07/2013 LM patent, LAD 30% prox, LCX patent, OM1 with patent stent, RCA 30% prox disease, LVEF 55-65% by LV gram  - still with occasional chest discomfort. Reports seen recently by pcp, thought could be related to possible anxiety. Started on prn benzos with improved symptoms.   2. Hyperlipidemia  - not on statin due to cirrhosis per notes   3. HTN  - does not check at home  - compliant with meds   4. Cirrhosis  - reports history of fatty liver. Followed by GI  Past Medical History  Diagnosis Date  . CAD, multiple vessel     PCI circumflex 2008, diffuse disease of the right and LAD treated medically.,  //   Nuclear 2010 no ischemia   //   nuclear May, 2012 no ischemia  . Mixed dyslipidemia   . Unspecified essential hypertension   . Cirrhosis of liver without mention of alcohol   . Esophageal reflux   . Unspecified diastolic heart failure   . Other chronic nonalcoholic liver disease   . Postsurgical percutaneous transluminal coronary angioplasty status   . Morbid obesity   . Diverticulosis   . IBS (irritable bowel syndrome)   . Dysphagia   . Lower abdominal pain   . Prostate cancer 03/24/12  . Ejection fraction     EF 55%, nuclear, May, 2012  . Contrast media allergy   . 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 Preop cardiovascular exam     Cardiovascular clearance for prostate surgery October, 2013     Allergies  Allergen Reactions  . Iohexol Other  (See Comments)    unknown  . Ivp Dye [Iodinated Diagnostic Agents] Other (See Comments)    Heart feels like its going to explode  . Clopidogrel Bisulfate Rash  . Codeine Palpitations     Current Outpatient Prescriptions  Medication Sig Dispense Refill  . amLODipine (NORVASC) 10 MG tablet Take 1 tablet (10 mg total) by mouth every morning.  90 tablet  3  . aspirin EC 81 MG tablet Take 81 mg by mouth daily.      . chlorthalidone (HYGROTON) 25 MG tablet Take 25 mg by mouth daily.      . chlorthalidone (HYGROTON) 25 MG tablet Take 1 tablet (25 mg total) by mouth daily.  90 tablet  3  . dicyclomine (BENTYL) 10 MG capsule Take 10 mg by mouth daily.      Marland Kitchen docusate sodium (COLACE) 100 MG capsule Take 100 mg by mouth daily.      . insulin glargine (LANTUS) 100 UNIT/ML injection Inject 6 Units into the skin daily.      Marland Kitchen lisinopril (PRINIVIL,ZESTRIL) 40 MG tablet Take 40 mg by mouth every morning.       . metoprolol (LOPRESSOR) 50 MG tablet Take 50 mg by mouth daily.      . Multiple Vitamin (MULTI VITAMIN MENS PO) Take 1 tablet  by mouth daily.       Marland Kitchen NITROSTAT 0.4 MG SL tablet Place 1 tablet under the tongue every 5 (five) minutes as needed. For chest pains       No current facility-administered medications for this visit.     Past Surgical History  Procedure Laterality Date  . Right knee surgery    . Vasectomy    . Cholecystectomy  06/1998  . Colonoscopy  03/03/03  . Upper gastrointestinal endoscopy  02/21/2011    EGD ED  . Upper gastrointestinal endoscopy  03/03/03    TCS  . Two cardiac stents    . Colonoscopy  08/17/2011    Procedure: COLONOSCOPY;  Surgeon: Rogene Houston, MD;  Location: [facility];  Service: Endoscopy;  Laterality: N/A;  1045  . Left rotator    . Back surgery      took cyst out of back  . Robot assisted laparoscopic radical prostatectomy  06/26/2012    Procedure: ROBOTIC ASSISTED LAPAROSCOPIC RADICAL PROSTATECTOMY LEVEL 3;  Surgeon: Dutch Gray, MD;   Location: [facility];  Service: Urology;  Laterality: N/A;         Allergies  Allergen Reactions  . Iohexol Other (See Comments)    unknown  . Ivp Dye [Iodinated Diagnostic Agents] Other (See Comments)    Heart feels like its going to explode  . Clopidogrel Bisulfate Rash  . Codeine Palpitations      Family History  Problem Relation Age of Onset  . Coronary artery disease      family history  . Heart disease Mother   . Breast cancer Mother   . Heart disease Father   . Breast cancer Sister   . Ovarian cancer Sister   . Heart disease Brother   . Healthy Daughter   . Healthy Daughter   . Healthy Daughter   . Healthy Daughter      Social History [name] reports that he quit smoking about 15 years ago. His smoking use included Cigarettes. He has a 90 pack-year smoking history. He has never used smokeless tobacco. [name] reports that he does not drink alcohol.   Review of Systems CONSTITUTIONAL: No weight loss, fever, chills, weakness or fatigue.  HEENT: Eyes: No visual loss, blurred vision, double vision or yellow sclerae.No hearing loss, sneezing, congestion, runny nose or sore throat.  SKIN: No rash or itching.  CARDIOVASCULAR: per HPI RESPIRATORY: No shortness of breath, cough or sputum.  GASTROINTESTINAL: No anorexia, nausea, vomiting or diarrhea. No abdominal pain or blood.  GENITOURINARY: No burning on urination, no polyuria NEUROLOGICAL: No headache, dizziness, syncope, paralysis, ataxia, numbness or tingling in the extremities. No change in bowel or bladder control.  MUSCULOSKELETAL: No muscle, back pain, joint pain or stiffness.  LYMPHATICS: No enlarged nodes. No history of splenectomy.  PSYCHIATRIC: No history of depression or anxiety.  ENDOCRINOLOGIC: No reports of sweating, cold or heat intolerance. No polyuria or polydipsia.  Marland Kitchen   Physical Examination p 57 bp 122/76 Wt 326 lbs BMI 42 Gen: resting comfortably, no acute distress HEENT: no scleral  icterus, pupils equal round and reactive, no palptable cervical adenopathy,  CV: RRR, no m/r/g, no JVD, no carotid bruits Resp: Clear to auscultation bilaterally GI: abdomen is soft, non-tender, non-distended, normal bowel sounds, no hepatosplenomegaly MSK: extremities are warm, no edema.  Skin: warm, no rash Neuro:  no focal deficits Psych: appropriate affect   Diagnostic Studies 02/2007 Cath  FINDINGS: Aortic pressure 131/85 with a mean of 106, left  ventricular  pressure is 128/16.  The left mainstem has luminal irregularities but no significant  angiographic stenosis.  The left mainstem bifurcates into the LAD and left circumflex. The LAD  is a large-caliber vessel that courses down and reaches the LV apex.  Proximal portion of the LAD in the area of the first septal perforator  has a diffuse 50% stenosis. There is a diagonal Shaunee Mulkern that arises from  this area. The diagonal is widely patent. The remaining portions of  the mid and distal LAD have diffuse nonobstructive plaque without  significant focal stenosis.  The left circumflex is a large-caliber caliber vessel. It gives off a  large first OM Hershy Flenner that has an 80% stenosis in its proximal portion.  The AV groove circumflex continues down and provides a small left  posterolateral Corde Antonini. There is no significant angiographic disease in  the AV groove circumflex.  The right coronary artery is diffusely diseased. There are luminal  irregularities in the proximal portion. The midportion in the stented  segment that is ectatic. Distally, there is diffuse disease with 50%  stenosis just before the bifurcation of the PDA and posterior AV  segment. The PDA and posterolateral branches have no significant  angiographic disease  Left ventriculography demonstrates normal LV systolic function with an  LVEF of 60%. There is no mitral regurgitation.  ASSESSMENT:  1. Severe obtuse marginal 1 stenosis.  2. Moderate diffuse disease in the  left anterior descending artery and  right coronary artery.  3. Normal left ventricular systolic function.  4. Successful percutaneous coronary intervention of the left  circumflex with a bare-metal stent.  Will continue aspirin indefinitely. The patient should be on  ticlopidine for 30 days with close monitoring of his CBC.   02/2013 Lexiscan MPI  Small partially reversible apical to mid anteroseptal defect. Partially reversible mid anterior defect. Fixed mid to basal inferior wall defect. Normal wall motion, thought to be artifact/noted soft tissue attenuation   11/17/13 clinic EKG  NSR,    11/2013 Cath Procedural Findings:  Hemodynamics:  AO 110/62 mean 79 mm Hg  LV 113/12 mm Hg  Coronary angiography:  Coronary dominance: right  Left mainstem: Normal  Left anterior descending (LAD): 30% disease in the proximal vessel. The first diagonal is normal.  Left circumflex (LCx): The LCx gives rise to a large OM 1, a small OM2, and terminates in a posterolateral Florine Sprenkle. The stent in the OM 1 is widely patent. No other significant disease in the LCx.  Right coronary artery (RCA): Large vessel. There is 30% disease in the proximal vessel, at the crux, and in the distal RCA.  Left ventriculography: Left ventricular systolic function is normal, LVEF is estimated at 55-65%, there is no significant mitral regurgitation  Final Conclusions:  1. Nonobstructive CAD. The prior stent in OM1 is widely patent.  2. Normal LV function.  Recommendations: Continue medical therapy.      Assessment and Plan  1. CAD  - cath 11/2013 with patent coronaries and stent - possible anxiety/panic attacks, symptoms have improved with prn benzos from pcps - continue current cardiac meds  2. Hyperlipidemia  - not on statin due to history of fatty liver and cirrhosis   3. HTN  - at goal, continue current meds     F/u 6 months  Billy Davis, M.D., F.A.C.C.

## 2014-01-15 ENCOUNTER — Ambulatory Visit
Admission: RE | Admit: 2014-01-15 | Discharge: 2014-01-15 | Disposition: A | Discharge status: Home / Self Care | Payer: Managed Care, Other (non HMO) | Source: Ambulatory Visit | Attending: Urology | Admitting: Urology

## 2014-01-15 DIAGNOSIS — N289 Disorder of kidney and ureter, unspecified: Secondary | ICD-10-CM

## 2014-01-15 MED ORDER — GADOBENATE DIMEGLUMINE 529 MG/ML IV SOLN
20.0000 mL | Freq: Once | INTRAVENOUS | Status: AC | PRN
Start: 2014-01-15 — End: 2014-01-15
  Administered 2014-01-15: 20 mL via INTRAVENOUS

## 2014-02-17 ENCOUNTER — Ambulatory Visit: Payer: Managed Care, Other (non HMO) | Admitting: Cardiology

## 2014-03-02 ENCOUNTER — Encounter (INDEPENDENT_AMBULATORY_CARE_PROVIDER_SITE_OTHER): Payer: Self-pay | Admitting: *Deleted

## 2014-03-09 ENCOUNTER — Telehealth (INDEPENDENT_AMBULATORY_CARE_PROVIDER_SITE_OTHER): Payer: Self-pay | Admitting: *Deleted

## 2014-03-09 ENCOUNTER — Other Ambulatory Visit (INDEPENDENT_AMBULATORY_CARE_PROVIDER_SITE_OTHER): Payer: Self-pay | Admitting: Internal Medicine

## 2014-03-09 DIAGNOSIS — K589 Irritable bowel syndrome without diarrhea: Secondary | ICD-10-CM

## 2014-03-09 MED ORDER — DICYCLOMINE HCL 10 MG PO CAPS: 10.0000 mg | ORAL_CAPSULE | Freq: Every day | ORAL | Status: DC

## 2014-03-09 NOTE — Telephone Encounter (Signed)
Rx sent to his pharmacy

## 2014-03-09 NOTE — Telephone Encounter (Signed)
Rx eprescribed

## 2014-03-09 NOTE — Telephone Encounter (Signed)
Needs a refill on his Bentyl sent to [facility]. They could not get the refill request to go through. Billy Davis's return phone number is [phone].

## 2014-03-23 ENCOUNTER — Ambulatory Visit (INDEPENDENT_AMBULATORY_CARE_PROVIDER_SITE_OTHER): Payer: Managed Care, Other (non HMO) | Admitting: Internal Medicine

## 2014-08-05 ENCOUNTER — Encounter (HOSPITAL_COMMUNITY): Payer: Self-pay | Admitting: Cardiology

## 2014-09-27 ENCOUNTER — Other Ambulatory Visit: Payer: Self-pay | Admitting: Cardiology

## 2014-11-09 ENCOUNTER — Other Ambulatory Visit: Payer: Self-pay | Admitting: Cardiology

## 2014-12-10 ENCOUNTER — Other Ambulatory Visit (INDEPENDENT_AMBULATORY_CARE_PROVIDER_SITE_OTHER): Payer: Self-pay | Admitting: Internal Medicine

## 2015-01-25 ENCOUNTER — Encounter (INDEPENDENT_AMBULATORY_CARE_PROVIDER_SITE_OTHER): Payer: Self-pay | Admitting: *Deleted

## 2015-01-25 ENCOUNTER — Encounter (INDEPENDENT_AMBULATORY_CARE_PROVIDER_SITE_OTHER): Payer: Self-pay | Admitting: Internal Medicine

## 2015-01-25 ENCOUNTER — Ambulatory Visit (INDEPENDENT_AMBULATORY_CARE_PROVIDER_SITE_OTHER): Payer: Managed Care, Other (non HMO) | Admitting: Internal Medicine

## 2015-01-25 VITALS — BP 118/78 | HR 80 | Temp 98.3°F | Resp 18 | Ht 74.0 in | Wt 331.5 lb

## 2015-01-25 DIAGNOSIS — K7460 Unspecified cirrhosis of liver: Secondary | ICD-10-CM

## 2015-01-25 DIAGNOSIS — K219 Gastro-esophageal reflux disease without esophagitis: Secondary | ICD-10-CM | POA: Diagnosis not present

## 2015-01-25 NOTE — Patient Instructions (Signed)
Please fax your recent blood work to our office; [phone]. Upper abdominal ultrasound to be scheduled.

## 2015-01-25 NOTE — Progress Notes (Signed)
Presenting complaint;  Follow-up for cirrhosis GERD and left-sided abdominal pain.  Subjective:  Patient is 59 year old Caucasian male was here for scheduled visit. He was last seen in March 2015. He continues to complain of left upper quadrant abdominal pain which does not occur daily and he describes it as mild and or pressure. He is not having diarrhea or urgency like he used to. Heartburns well controlled with therapy. He is having some issues with his insurance covering this medication. His bowels move daily. He denies melena or rectal bleeding. He has not lost any weight since his last visit. He states he is not able to walk for long account of chronic low back pain. He was found to have 5 ruptured disks and has seen Dr. Joya Salm who recommended delaying surgery until his pain becomes intractable. He states he had complete blood count last month through his employer but forgot to bring [place] a copy.    Current Medications: Outpatient Encounter Prescriptions as of 01/25/2015  Medication Sig  . amLODipine (NORVASC) 10 MG tablet TAKE 1 TABLET EVERY MORNING (DOSE INCREASE 11/17/13)  . aspirin EC 81 MG tablet Take 81 mg by mouth daily.  . chlorthalidone (HYGROTON) 25 MG tablet TAKE 1 TABLET DAILY  . diazepam (VALIUM) 2 MG tablet Take 1 tablet by mouth daily as needed.  . dicyclomine (BENTYL) 10 MG capsule TAKE 1 CAPSULE DAILY  . insulin glargine (LANTUS) 100 UNIT/ML injection Inject 12 Units into the skin daily.   Marland Kitchen lisinopril (PRINIVIL,ZESTRIL) 40 MG tablet Take 40 mg by mouth every morning.   . metoprolol (LOPRESSOR) 50 MG tablet Take 50 mg by mouth daily.  . Multiple Vitamin (MULTI VITAMIN MENS PO) Take 1 tablet by mouth daily.   Marland Kitchen NITROSTAT 0.4 MG SL tablet Place 1 tablet under the tongue every 5 (five) minutes as needed. For chest pains  . [DISCONTINUED] chlorthalidone (HYGROTON) 25 MG tablet Take 25 mg by mouth daily.  . [DISCONTINUED] docusate sodium (COLACE) 100 MG capsule Take 100 mg by  mouth daily.   No facility-administered encounter medications on file as of 01/25/2015.     Objective: Blood pressure 118/78, pulse 80, temperature 98.3 F (36.8 C), temperature source Oral, resp. rate 18, height 6' 2"  (1.88 m), weight 331 lb 8 oz (150.367 kg). Patient is alert and in no acute distress. He does not have asterixis. Conjunctiva is pink. Sclera is nonicteric Oropharyngeal mucosa is normal. No neck masses or thyromegaly noted. Cardiac exam with regular rhythm normal S1 and S2. No murmur or gallop noted. Lungs are clear to auscultation. Abdomen is protuberant but soft and nontender without organomegaly or masses.  Trace edema noted around ankles.  Labs/studies Results: No recent labs available   Assessment:  #1. Cirrhosis secondary to NAFLD. He remains with compensated disease. Last EGD was in June 2012 and was negative for esophageal varices. #2. GERD. He is doing well with therapy. #3.  LUQ abdominal pain secondary to IBS. He is having pain less frequently than he used to have. Last colonoscopy was in December 2012 revealing hyperplastic polyps. #4.  Obesity. His weight has not changed since his last visit 6 months ago.   Plan:  Upper abdominal ultrasound. Review recent blood work that he had prior to performing further studies. Office visit in 6 months.

## 2015-02-03 ENCOUNTER — Encounter (INDEPENDENT_AMBULATORY_CARE_PROVIDER_SITE_OTHER): Payer: Self-pay

## 2015-02-05 ENCOUNTER — Other Ambulatory Visit: Payer: Self-pay | Admitting: Cardiology

## 2015-03-18 ENCOUNTER — Encounter (INDEPENDENT_AMBULATORY_CARE_PROVIDER_SITE_OTHER): Payer: Self-pay

## 2015-05-16 ENCOUNTER — Encounter (INDEPENDENT_AMBULATORY_CARE_PROVIDER_SITE_OTHER): Payer: Self-pay | Admitting: *Deleted

## 2015-08-01 ENCOUNTER — Ambulatory Visit (INDEPENDENT_AMBULATORY_CARE_PROVIDER_SITE_OTHER): Payer: Managed Care, Other (non HMO) | Admitting: Internal Medicine

## 2015-08-10 ENCOUNTER — Other Ambulatory Visit (INDEPENDENT_AMBULATORY_CARE_PROVIDER_SITE_OTHER): Payer: Self-pay | Admitting: Internal Medicine

## 2015-08-16 ENCOUNTER — Ambulatory Visit (INDEPENDENT_AMBULATORY_CARE_PROVIDER_SITE_OTHER): Payer: Managed Care, Other (non HMO) | Admitting: Internal Medicine

## 2015-11-29 ENCOUNTER — Ambulatory Visit (INDEPENDENT_AMBULATORY_CARE_PROVIDER_SITE_OTHER): Payer: Managed Care, Other (non HMO) | Admitting: Internal Medicine

## 2016-02-06 ENCOUNTER — Other Ambulatory Visit (INDEPENDENT_AMBULATORY_CARE_PROVIDER_SITE_OTHER): Payer: Self-pay | Admitting: Internal Medicine

## 2016-04-12 ENCOUNTER — Other Ambulatory Visit: Payer: Self-pay | Admitting: Neurosurgery

## 2016-04-12 DIAGNOSIS — M4807 Spinal stenosis, lumbosacral region: Secondary | ICD-10-CM

## 2016-04-24 ENCOUNTER — Ambulatory Visit
Admission: RE | Admit: 2016-04-24 | Discharge: 2016-04-24 | Disposition: A | Discharge status: Home / Self Care | Payer: Managed Care, Other (non HMO) | Source: Ambulatory Visit | Attending: Neurosurgery | Admitting: Neurosurgery

## 2016-04-24 DIAGNOSIS — M4807 Spinal stenosis, lumbosacral region: Secondary | ICD-10-CM

## 2016-04-24 MED ORDER — IOPAMIDOL (ISOVUE-M 200) INJECTION 41%
15.0000 mL | Freq: Once | INTRAMUSCULAR | Status: AC
  Administered 2016-04-24: 15 mL via INTRATHECAL

## 2016-04-24 MED ORDER — ONDANSETRON HCL 4 MG/2ML IJ SOLN
4.0000 mg | Freq: Once | INTRAMUSCULAR | Status: AC
  Administered 2016-04-24: 4 mg via INTRAMUSCULAR

## 2016-04-24 MED ORDER — DIAZEPAM 5 MG PO TABS
10.0000 mg | ORAL_TABLET | Freq: Once | ORAL | Status: AC
  Administered 2016-04-24: 10 mg via ORAL

## 2016-04-24 MED ORDER — MEPERIDINE HCL 100 MG/ML IJ SOLN
75.0000 mg | Freq: Once | INTRAMUSCULAR | Status: AC
  Administered 2016-04-24: 75 mg via INTRAMUSCULAR

## 2016-04-24 NOTE — Discharge Instructions (Signed)

## 2016-04-27 ENCOUNTER — Telehealth: Payer: Self-pay

## 2016-04-27 NOTE — Telephone Encounter (Signed)
Spoke with patient after his myelogram here 04/24/16, and he says he is doing fine and has no headache. jkl

## 2016-05-15 ENCOUNTER — Other Ambulatory Visit: Payer: Self-pay | Admitting: Neurosurgery

## 2016-07-02 ENCOUNTER — Other Ambulatory Visit: Payer: Self-pay | Admitting: Neurosurgery

## 2016-07-04 ENCOUNTER — Encounter (HOSPITAL_COMMUNITY): Payer: Self-pay

## 2016-07-05 ENCOUNTER — Encounter (HOSPITAL_COMMUNITY)
Admission: RE | Admit: 2016-07-05 | Discharge: 2016-07-05 | Disposition: A | Discharge status: Home / Self Care | Payer: Managed Care, Other (non HMO) | Source: Ambulatory Visit | Attending: Neurosurgery | Admitting: Neurosurgery

## 2016-07-05 ENCOUNTER — Encounter (HOSPITAL_COMMUNITY): Payer: Self-pay

## 2016-07-05 DIAGNOSIS — Z79899 Other long term (current) drug therapy: Secondary | ICD-10-CM | POA: Diagnosis not present

## 2016-07-05 DIAGNOSIS — Z01818 Encounter for other preprocedural examination: Secondary | ICD-10-CM | POA: Diagnosis present

## 2016-07-05 DIAGNOSIS — Z0183 Encounter for blood typing: Secondary | ICD-10-CM | POA: Diagnosis not present

## 2016-07-05 DIAGNOSIS — I110 Hypertensive heart disease with heart failure: Secondary | ICD-10-CM | POA: Insufficient documentation

## 2016-07-05 DIAGNOSIS — Z8546 Personal history of malignant neoplasm of prostate: Secondary | ICD-10-CM | POA: Insufficient documentation

## 2016-07-05 DIAGNOSIS — Z7982 Long term (current) use of aspirin: Secondary | ICD-10-CM | POA: Insufficient documentation

## 2016-07-05 DIAGNOSIS — I2510 Atherosclerotic heart disease of native coronary artery without angina pectoris: Secondary | ICD-10-CM | POA: Diagnosis not present

## 2016-07-05 DIAGNOSIS — Z955 Presence of coronary angioplasty implant and graft: Secondary | ICD-10-CM | POA: Diagnosis not present

## 2016-07-05 DIAGNOSIS — Z794 Long term (current) use of insulin: Secondary | ICD-10-CM | POA: Diagnosis not present

## 2016-07-05 DIAGNOSIS — I5030 Unspecified diastolic (congestive) heart failure: Secondary | ICD-10-CM | POA: Insufficient documentation

## 2016-07-05 DIAGNOSIS — K219 Gastro-esophageal reflux disease without esophagitis: Secondary | ICD-10-CM | POA: Insufficient documentation

## 2016-07-05 DIAGNOSIS — K7460 Unspecified cirrhosis of liver: Secondary | ICD-10-CM | POA: Diagnosis not present

## 2016-07-05 DIAGNOSIS — K589 Irritable bowel syndrome without diarrhea: Secondary | ICD-10-CM | POA: Diagnosis not present

## 2016-07-05 DIAGNOSIS — Z87891 Personal history of nicotine dependence: Secondary | ICD-10-CM | POA: Insufficient documentation

## 2016-07-05 DIAGNOSIS — M4807 Spinal stenosis, lumbosacral region: Secondary | ICD-10-CM | POA: Diagnosis not present

## 2016-07-05 DIAGNOSIS — Z01812 Encounter for preprocedural laboratory examination: Secondary | ICD-10-CM | POA: Diagnosis not present

## 2016-07-05 DIAGNOSIS — E119 Type 2 diabetes mellitus without complications: Secondary | ICD-10-CM | POA: Insufficient documentation

## 2016-07-05 HISTORY — DX: Type 2 diabetes mellitus without complications: E11.9

## 2016-07-05 LAB — HEPATIC FUNCTION PANEL
ALBUMIN: 4.3 g/dL (ref 3.5–5.0)
ALT: 39 U/L (ref 17–63)
AST: 36 U/L (ref 15–41)
Alkaline Phosphatase: 54 U/L (ref 38–126)
BILIRUBIN DIRECT: 0.2 mg/dL (ref 0.1–0.5)
BILIRUBIN TOTAL: 1.3 mg/dL — AB (ref 0.3–1.2)
Indirect Bilirubin: 1.1 mg/dL — ABNORMAL HIGH (ref 0.3–0.9)
Total Protein: 7.3 g/dL (ref 6.5–8.1)

## 2016-07-05 LAB — TYPE AND SCREEN
ABO/RH(D): A POS
Antibody Screen: NEGATIVE

## 2016-07-05 LAB — BASIC METABOLIC PANEL
ANION GAP: 10 (ref 5–15)
BUN: 12 mg/dL (ref 6–20)
CHLORIDE: 103 mmol/L (ref 101–111)
CO2: 26 mmol/L (ref 22–32)
CREATININE: 0.8 mg/dL (ref 0.61–1.24)
Calcium: 9.4 mg/dL (ref 8.9–10.3)
GFR calc non Af Amer: >60 mL/min (ref >60)
Glucose, Bld: 153 mg/dL — ABNORMAL HIGH (ref 65–99)
POTASSIUM: 3.3 mmol/L — AB (ref 3.5–5.1)
SODIUM: 139 mmol/L (ref 135–145)

## 2016-07-05 LAB — CBC
HCT: 44.5 % (ref 39.0–52.0)
HEMOGLOBIN: 15.9 g/dL (ref 13.0–17.0)
MCH: 28.8 pg (ref 26.0–34.0)
MCHC: 35.7 g/dL (ref 30.0–36.0)
MCV: 80.5 fL (ref 78.0–100.0)
PLATELETS: 118 10*3/uL — AB (ref 150–400)
RBC: 5.53 MIL/uL (ref 4.22–5.81)
RDW: 13.6 % (ref 11.5–15.5)
WBC: 6.1 10*3/uL (ref 4.0–10.5)

## 2016-07-05 LAB — GLUCOSE, CAPILLARY: GLUCOSE-CAPILLARY: 157 mg/dL — AB (ref 65–99)

## 2016-07-05 LAB — SURGICAL PCR SCREEN
MRSA, PCR: NEGATIVE
Staphylococcus aureus: POSITIVE — AB

## 2016-07-05 LAB — ABO/RH: ABO/RH(D): A POS

## 2016-07-05 NOTE — Progress Notes (Signed)
   07/05/16 0815  OBSTRUCTIVE SLEEP APNEA  Have you ever been diagnosed with sleep apnea through a sleep study? No  Do you snore loudly (loud enough to be heard through closed doors)?  0  Do you often feel tired, fatigued, or sleepy during the daytime (such as falling asleep during driving or talking to someone)? 0  Has anyone observed you stop breathing during your sleep? 0  Do you have, or are you being treated for high blood pressure? 1  BMI more than 35 kg/m2? 1  Age > 50 (1-yes) 1  Neck circumference greater than:Male 16 inches or larger, Male 17inches or larger? 1  Male Gender (Yes=1) 1  Obstructive Sleep Apnea Score 5  Score 5 or greater  Results sent to PCP

## 2016-07-05 NOTE — Progress Notes (Signed)
Dorthey Sawyer, RN Registered Nurse Incomplete   Pre-Procedure Instructions Date of Service: 07/04/2016 12:20 PM      [] Hide copied text [] Hover for attribution information               Billy Davis            07/04/2016                          [facility] [address] [place] [address] Phone: [phone] Fax: [place], [place] [address] [place] [address] Phone: [phone] Fax: [phone]              Your procedure is scheduled on Nov 14            Report to [place] at 700  A.M.            Call this number if you have problems the morning of surgery:            [phone]             Remember:            Do not eat food or drink liquids after midnight.             Take these medicines the morning of surgery with A SIP OF WATER Amlodipine (Norvasc), diazepam (Valium) if needed, Dicyclomine (Bentyl), Metoprolol (Lopressor), Nitrostat if needed, Pantoprazole (Protonix)  Stop taking aspirin, BC's, goody's, Herbal medications, Fish Oil, Ibuprofen, Advil, Motrin, Aleve    HOW TO MANAGE YOUR DIABETES BEFORE AND AFTER SURGERY  Why is it important to control my blood sugar before and after surgery?  Improving blood sugar levels before and after surgery helps healing and can limit problems.  A way of improving blood sugar control is eating a healthy diet by: ?  Eating less sugar and carbohydrates ?  Increasing activity/exercise ?  Talking with your doctor about reaching your blood sugar goals  High blood sugars (greater than 180 mg/dL) can raise your risk of infections and slow your recovery, so you will need to focus on controlling your diabetes during the weeks before surgery.  Make sure that the doctor who takes care of your diabetes knows about your planned surgery including the date and location.  How do I  manage my blood sugar before surgery?  Check your blood sugar at least 4 times a day, starting 2 days before surgery, to make sure that the level is not too high or low. ? Check your blood sugar the morning of your surgery when you wake up and every 2 hours until you get to the Short Stay unit.  If your blood sugar is less than 70 mg/dL, you will need to treat for low blood sugar: ? Do not take insulin. ? Treat a low blood sugar (less than 70 mg/dL) with  cup of clear juice (cranberry or apple), 4glucose tablets, OR glucose gel. ? Recheck blood sugar in 15 minutes after treatment (to make sure it is greater than 70 mg/dL). If your blood sugar is not greater than 70 mg/dL on recheck, call [phone] for further instructions.  Report your blood sugar to the short stay nurse when you get to Short Stay.   If you are admitted to the hospital after surgery: ?  Your blood sugar will be checked by the staff and you will probably be given insulin after surgery (instead of oral diabetes medicines) to make sure you have good blood sugar levels. ? The goal for blood sugar control after surgery is 80-180 mg/dL.     WHAT DO I DO ABOUT MY DIABETES MEDICATION?    Do not take oral diabetes medicines (pills) the morning of surgery.    HE MORNING OF SURGERY, take ____25_________ units of __LANTUS ________insulin.                                                                                                                                     Do not wear jewelry, make-up or nail polish.            Do not wear lotions, powders, or perfumes, or deoderant.            Do not shave 48 hours prior to surgery.  Men may shave face and neck.            Do not bring valuables to the hospital.            [facility] is not responsible for any belongings or valuables.  Contacts, dentures or bridgework may not be worn into surgery.  Leave your suitcase in the car.  After surgery it may be brought  to your room.  For patients admitted to the hospital, discharge time will be determined by your treatment team.  Patients discharged the day of surgery will not be allowed to drive home.    Special instructions:   [facility]- Preparing For Surgery  Before surgery, you can play an important role. Because skin is not sterile, your skin needs to be as free of germs as possible. You can reduce the number of germs on your skin by washing with CHG (chlorahexidine gluconate) Soap before surgery.  CHG is an antiseptic cleaner which kills germs and bonds with the skin to continue killing germs even after washing.  Please do not use if you have an allergy to CHG or antibacterial soaps. If your skin becomes reddened/irritated stop using the CHG.  Do not shave (including legs and underarms) for at least 48 hours prior to first CHG shower. It is OK to shave your face.  Please follow these instructions carefully.   1. Shower the NIGHT BEFORE SURGERY and the MORNING OF SURGERY with CHG.   2. If you chose to wash your hair, wash your hair first as usual with your normal shampoo.  3. After you shampoo, rinse your hair and body thoroughly to remove the shampoo.  4. Use CHG as you would any other liquid soap. You can apply CHG directly to the skin and wash gently with a scrungie or a clean washcloth.   5. Apply the CHG Soap to your body ONLY FROM THE NECK DOWN.  Do not use on open wounds  or open sores. Avoid contact with your eyes, ears, mouth and genitals (private parts). Wash genitals (private parts) with your normal soap.  6. Wash thoroughly, paying special attention to the area where your surgery will be performed.  7. Thoroughly rinse your body with warm water from the neck down.  8. DO NOT shower/wash with your normal soap after using and rinsing off the CHG Soap.  9. Pat yourself dry with a CLEAN TOWEL.   10. Wear CLEAN PAJAMAS   11. Place CLEAN SHEETS on your bed the night of your  first shower and DO NOT SLEEP WITH PETS.    Day of Surgery: Do not apply any deodorants/lotions. Please wear clean clothes to the hospital/surgery center.         Please read over the following fact sheets that you were given. Pain Booklet, Coughing and Deep Breathing, MRSA Information and Surgical Site Infection Prevention

## 2016-07-05 NOTE — Progress Notes (Signed)
Positive for staph called in prescription at [facility] in [place]

## 2016-07-05 NOTE — Progress Notes (Signed)
PCP - [name] - stop bang sent to PCP via epic Cardiologist - Martinique - patient has not seen in 2 years  Chest x-ray - not needed EKG - 07/05/16 Stress Test - 2014 ECHO - denies Cardiac Cath - 2015  Will send to anesthesia for review of cardiac history. Cardiac clearance may be needed before surgery.  Patient has extensive cardiac history with 2 cardiac stents placed during a heart cath.  Patient stated that he was supposed to make another appointment with Dr Martinique but choose not to because he felt that he did not need to see him anymore because he felt that the chest pain that he was feeling was relating to anxiety and panic attacks.  But patient is not taking any medications for anxiety. Will complete EKG today    Patient denies shortness of breath, fever, cough and chest pain at PAT appointment

## 2016-07-06 ENCOUNTER — Encounter: Payer: Self-pay | Admitting: Internal Medicine

## 2016-07-06 ENCOUNTER — Encounter (HOSPITAL_COMMUNITY): Payer: Self-pay

## 2016-07-06 ENCOUNTER — Ambulatory Visit (INDEPENDENT_AMBULATORY_CARE_PROVIDER_SITE_OTHER): Payer: Managed Care, Other (non HMO) | Admitting: Internal Medicine

## 2016-07-06 ENCOUNTER — Encounter: Payer: Self-pay | Admitting: *Deleted

## 2016-07-06 VITALS — BP 126/76 | HR 79 | Ht 74.0 in | Wt 323.0 lb

## 2016-07-06 DIAGNOSIS — I25810 Atherosclerosis of coronary artery bypass graft(s) without angina pectoris: Secondary | ICD-10-CM

## 2016-07-06 DIAGNOSIS — I5032 Chronic diastolic (congestive) heart failure: Secondary | ICD-10-CM | POA: Diagnosis not present

## 2016-07-06 DIAGNOSIS — I10 Essential (primary) hypertension: Secondary | ICD-10-CM | POA: Diagnosis not present

## 2016-07-06 LAB — HEMOGLOBIN A1C
HEMOGLOBIN A1C: 9.3 % — AB (ref 4.8–5.6)
Mean Plasma Glucose: 220 mg/dL

## 2016-07-06 NOTE — Patient Instructions (Signed)
Your physician wants you to follow-up in: 1 year Dr Theressa Stamps will receive a reminder letter in the mail two months in advance. If you don't receive a letter, please call our office to schedule the follow-up appointment.    Your physician recommends that you continue on your current medications as directed. Please refer to the Current Medication list given to you today.     Thank you for choosing [facility] !

## 2016-07-06 NOTE — Progress Notes (Signed)
Cardiology Office Note   Date:  07/06/2016   ID:  Billy Davis, DOB [birth year], MRN [id number]  0.     PCP:  [name], MD  Cardiologist:   Branch  Preop eval    History of Present Illness: Billy Davis is a 60 y.o. male with a history of CAD  (s/p DES to RCA in 2004; BMS to Om in 9675), diastlic CHF, HTN, DM, cirrhosis, GERD, Being evalauted for back surgery He was last seen in cardiology by Zandra Abts in 2015 Cath prior showed nonbobstrucive CAD    No CP   No SOB    Walks   Can walk stairs without a problem       Outpatient Medications Prior to Visit  Medication Sig Dispense Refill  . amLODipine (NORVASC) 5 MG tablet Take 5 mg by mouth daily.    Marland Kitchen aspirin EC 81 MG tablet Take 81 mg by mouth daily.    . chlorthalidone (HYGROTON) 25 MG tablet TAKE 1 TABLET DAILY 30 tablet 0  . diazepam (VALIUM) 2 MG tablet Take 2 mg by mouth daily as needed (for anxiety).     Marland Kitchen dicyclomine (BENTYL) 10 MG capsule TAKE 1 CAPSULE DAILY 60 capsule 2  . ibuprofen (ADVIL,MOTRIN) 200 MG tablet Take 200-600 mg by mouth every 8 (eight) hours as needed (for pain.).    Marland Kitchen LANTUS SOLOSTAR 100 UNIT/ML Solostar Pen Inject 50 Units into the skin daily.    Marland Kitchen lisinopril (PRINIVIL,ZESTRIL) 40 MG tablet Take 40 mg by mouth every morning.     . metoprolol (LOPRESSOR) 50 MG tablet Take 50 mg by mouth daily.    . Multiple Vitamin (MULTI VITAMIN MENS PO) Take 1 tablet by mouth daily.     Marland Kitchen NITROSTAT 0.4 MG SL tablet Place 1 tablet under the tongue every 5 (five) minutes as needed. For chest pains    . pantoprazole (PROTONIX) 40 MG tablet Take 40 mg by mouth daily.     No facility-administered medications prior to visit.      Allergies:   Codeine and Plavix [clopidogrel bisulfate]   Past Medical History:  Diagnosis Date  . Aneurysm ([place])    right femoral pseudoaneurysm, thrombosed (following 03/20/07 cardiac cath)  . CAD, multiple vessel    PCI circumflex 2008, diffuse disease of the right and LAD  treated medically.,  //   Nuclear 2010 no ischemia   //   nuclear May, 2012 no ischemia  . Cirrhosis of liver without mention of alcohol   . Contrast media allergy    pt states he is not allergy to contrast  . Diabetes mellitus without complication ([place])    type 2  . Diverticulosis   . Dysphagia   . Ejection fraction    EF 55%, nuclear, May, 2012  . Esophageal reflux   . IBS (irritable bowel syndrome)   . Lower abdominal pain   . Mixed dyslipidemia   . Morbid obesity ([place])   . Other chronic nonalcoholic liver disease   . Postsurgical percutaneous transluminal coronary angioplasty status   . Preop cardiovascular exam    Cardiovascular clearance for prostate surgery October, 2013  . Prostate cancer ([place]) 03/24/12  . Unspecified diastolic heart failure   . Unspecified essential hypertension     Past Surgical History:  Procedure Laterality Date  . BACK SURGERY     took cyst out of back  . CARDIAC CATHETERIZATION    . CARPAL TUNNEL RELEASE Bilateral   . CHOLECYSTECTOMY  06/1998  . COLONOSCOPY  03/03/03  . COLONOSCOPY  08/17/2011   Procedure: COLONOSCOPY;  Surgeon: Rogene Houston, MD;  Location: [facility] SUITE;  Service: Endoscopy;  Laterality: N/A;  1045  . LEFT HEART CATHETERIZATION WITH CORONARY ANGIOGRAM N/A 12/07/2013   Procedure: LEFT HEART CATHETERIZATION WITH CORONARY ANGIOGRAM;  Surgeon: Peter M Martinique, MD;  Location: [facility] CATH LAB;  Service: Cardiovascular;  Laterality: N/A;  . left rotator    . RIGHT KNEE SURGERY    . ROBOT ASSISTED LAPAROSCOPIC RADICAL PROSTATECTOMY  06/26/2012   Procedure: ROBOTIC ASSISTED LAPAROSCOPIC RADICAL PROSTATECTOMY LEVEL 3;  Surgeon: Dutch Gray, MD;  Location: [facility] ORS;  Service: Urology;  Laterality: N/A;      . Two cardiac stents    . UPPER GASTROINTESTINAL ENDOSCOPY  02/21/2011   EGD ED  . UPPER GASTROINTESTINAL ENDOSCOPY  03/03/03   TCS  . VASECTOMY       Social History:  The patient  reports that he quit smoking about 17 years ago.  His smoking use included Cigarettes. He has a 90.00 pack-year smoking history. He has never used smokeless tobacco. He reports that he does not drink alcohol or use drugs.   Family History:  The patient's family history includes Breast cancer in his mother and sister; Healthy in his daughter, daughter, daughter, and daughter; Heart disease in his brother, father, and mother; Ovarian cancer in his sister.    ROS:  Please see the history of present illness. All other systems are reviewed and  Negative to the above problem except as noted.    PHYSICAL EXAM: VS:  BP 126/76 (BP Location: Right Arm)   Pulse 79   Ht 6' 2"  (1.88 m)   Wt (!) 323 lb (146.5 kg)   SpO2 94%   BMI 41.47 kg/m   GEN: Morbidly obese 60 yo , in no acute distress  HEENT: normal  Neck: no JVD, carotid bruits, or masses Cardiac: RRR; no murmurs, rubs, or gallops,Tr edema  Respiratory:  clear to auscultation bilaterally, normal work of breathing GI: soft, nontender, nondistended, + BS  No hepatomegaly  MS: no deformity Moving all extremities   Skin: warm and dry, no rash Neuro:  Strength and sensation are intact Psych: euthymic mood, full affect   EKG:  EKG is ordered yesterday  SR 65 bpm     Lipid Panel No results found for: CHOL, TRIG, HDL, CHOLHDL, VLDL, LDLCALC, LDLDIRECT    Wt Readings from Last 3 Encounters:  07/06/16 (!) 323 lb (146.5 kg)  07/05/16 (!) 322 lb 6.4 oz (146.2 kg)  04/24/16 (!) 328 lb (148.8 kg)      ASSESSMENT AND PLAN: 1  CAD Hx of interventions  Last cath in 2014 showed mild CAD  No symptoms to sugg angian  From a cardiac standpoint I feel he is low risk for a major cardiac event  OK to proveed  2.  HL  WIll get lipids from Dr Manya Silvas office  3  Morbid obesity  Knows he has to lose wt      Current medicines are reviewed at length with the patient today.  The patient does not have concerns regarding medicines.  Signed, Dorris Carnes, MD  07/06/2016 1:54 PM    [facility] [address], [place]  [address] Phone: [phone]; Fax: [phone]

## 2016-07-06 NOTE — Progress Notes (Addendum)
Anesthesia Chart Review: Patient is a 60 year old male scheduled for L2-5 laminectomy with L5-S1 fusion/cages/screws on 07/10/16 by Dr. Joya Salm. OR room is booked for ~ 3 1/2 hours.  History includes former smoker (quit '00), CAD s/p DES RCA 07/30/03 and BMS OM1 1/68/37 (complicated by right femoral pseudoaneurysm, thrombosed), diastolic CHF, HTN, DM2, NAFLD with cirrhosis (no varies by EGD 01/2011), GERD, IBS, prostate cancer s/p robotic assisted laparoscopic radical prostatectomy '13. BMI is consistent with morbid obesity. OSA screening score is 5.  Meds include amlodipine, ASA 81 mg, chlorthalidone, Valium, Bentyl, Lantus, lisinopril, Lopressor, nitroglycerin, Protonix.  PCP is DR. [name].  GI is Dr. Hildred Laser, last visit 01/25/15.   Cardiologist is Dr. Carlyle Dolly, last visit 12/28/13. Had undergone cardiac cath the month prior due to chest pain. Non-obstructive disease with patent OM1 stent noted, and medical therapy recommended. He was still had occasional chest pains, but was given PRN benzos by his PCP which he felt helped his symptoms. Six month follow-up recommended, but patient has not been.  BP (!) 148/82   Pulse 70   Temp 36.8 C   Resp 20   Ht 6' 2"  (1.88 m)   Wt (!) 322 lb 6.4 oz (146.2 kg)   SpO2 97%   BMI 41.39 kg/m   07/05/16 EKG: NSR.  12/07/13 Cardiac cath: - Coronary dominance: right - Left mainstem: Normal - Left anterior descending (LAD): 30% disease in the proximal vessel. The first diagonal is normal. - Left circumflex (LCx): The LCx gives rise to a large OM 1, a small OM2, and terminates in a posterolateral branch. The stent in the OM 1 is widely patent. No other significant disease in the LCx. - Right coronary artery (RCA): Large vessel. There is 30% disease in the proximal vessel, at the crux, and in the distal RCA. - Left ventriculography: Left ventricular systolic function is normal, LVEF is estimated at 55-65%, there is no significant mitral  regurgitation.  Final Conclusions:   1. Nonobstructive CAD. The prior stent in OM1 is widely patent. 2. Normal LV function. Recommendations: Continue medical therapy.  Preoperative labs noted. K 3.3, Cr 0.80, AST/ALT 36/39, H/H 15.9/44.5, PLT 118K (stable), glucose 153. A1c 9.3, consistent with mean plasma glucose of 220. T&S done. A1c results called with Janett Billow at Dr. Harley Hallmark office.  Patient has not seen cardiologist since 12/2013. Cath was done 11/2013. He denied chest pain, cough, fever, and SOB at PAT. DM2 is not well controlled. Discussed with anesthesiologist Dr. Linna Caprice. Recommend cardiology preoperative evaluation. Message left for Jessica at Dr. Harley Hallmark office.  [facility]/Anesthesiology Phone [phone] 07/06/2016 12:19 PM  Addendum: Patient was seen by cardiologist Dr. Harrington Challenger this afternoon. She wrote, "CAD Hx of interventions  Last cath in 2014 showed mild CAD  No symptoms to sugg angian  From a cardiac standpoint I feel he is low risk for a major cardiac event  OK to proveed."  [facility]/Anesthesiology Phone [phone] 07/06/2016 4:49 PM

## 2016-07-09 MED ORDER — ONDANSETRON HCL 4 MG/2ML IJ SOLN
INTRAMUSCULAR | Status: AC
  Filled 2016-07-09: qty 2

## 2016-07-09 MED ORDER — LIDOCAINE 2% (20 MG/ML) 5 ML SYRINGE
INTRAMUSCULAR | Status: AC
  Filled 2016-07-09: qty 5

## 2016-07-09 MED ORDER — DEXAMETHASONE SODIUM PHOSPHATE 10 MG/ML IJ SOLN
INTRAMUSCULAR | Status: AC
  Filled 2016-07-09: qty 1

## 2016-07-09 MED ORDER — SUGAMMADEX SODIUM 200 MG/2ML IV SOLN
INTRAVENOUS | Status: AC
  Filled 2016-07-09: qty 2

## 2016-07-09 MED ORDER — SUCCINYLCHOLINE CHLORIDE 200 MG/10ML IV SOSY
PREFILLED_SYRINGE | INTRAVENOUS | Status: AC
  Filled 2016-07-09: qty 10

## 2016-07-09 MED ORDER — ROCURONIUM BROMIDE 10 MG/ML (PF) SYRINGE
PREFILLED_SYRINGE | INTRAVENOUS | Status: AC
  Filled 2016-07-09: qty 10

## 2016-07-10 ENCOUNTER — Inpatient Hospital Stay (HOSPITAL_COMMUNITY): Payer: Managed Care, Other (non HMO) | Admitting: Certified Registered"

## 2016-07-10 ENCOUNTER — Inpatient Hospital Stay (HOSPITAL_COMMUNITY): Payer: Managed Care, Other (non HMO)

## 2016-07-10 ENCOUNTER — Encounter (HOSPITAL_COMMUNITY): Payer: Self-pay | Admitting: General Practice

## 2016-07-10 ENCOUNTER — Encounter (HOSPITAL_COMMUNITY)
Admission: RE | Disposition: A | Discharge status: Home Health | Payer: Self-pay | Source: Ambulatory Visit | Attending: Neurosurgery

## 2016-07-10 ENCOUNTER — Inpatient Hospital Stay (HOSPITAL_COMMUNITY)
Admission: RE | Admit: 2016-07-10 | Discharge: 2016-07-14 | DRG: 460 | Disposition: A | Discharge status: Home Health | Payer: Managed Care, Other (non HMO) | Source: Ambulatory Visit | Attending: Neurosurgery | Admitting: Neurosurgery

## 2016-07-10 ENCOUNTER — Inpatient Hospital Stay (HOSPITAL_COMMUNITY): Payer: Managed Care, Other (non HMO) | Admitting: Vascular Surgery

## 2016-07-10 DIAGNOSIS — M4316 Spondylolisthesis, lumbar region: Secondary | ICD-10-CM | POA: Diagnosis present

## 2016-07-10 DIAGNOSIS — Z8546 Personal history of malignant neoplasm of prostate: Secondary | ICD-10-CM

## 2016-07-10 DIAGNOSIS — M51369 Other intervertebral disc degeneration, lumbar region without mention of lumbar back pain or lower extremity pain: Secondary | ICD-10-CM

## 2016-07-10 DIAGNOSIS — E119 Type 2 diabetes mellitus without complications: Secondary | ICD-10-CM | POA: Diagnosis present

## 2016-07-10 DIAGNOSIS — M5416 Radiculopathy, lumbar region: Secondary | ICD-10-CM | POA: Diagnosis present

## 2016-07-10 DIAGNOSIS — M48062 Spinal stenosis, lumbar region with neurogenic claudication: Principal | ICD-10-CM | POA: Diagnosis present

## 2016-07-10 DIAGNOSIS — M4317 Spondylolisthesis, lumbosacral region: Secondary | ICD-10-CM | POA: Diagnosis present

## 2016-07-10 DIAGNOSIS — M4690 Unspecified inflammatory spondylopathy, site unspecified: Secondary | ICD-10-CM | POA: Diagnosis present

## 2016-07-10 DIAGNOSIS — Z888 Allergy status to other drugs, medicaments and biological substances status: Secondary | ICD-10-CM | POA: Diagnosis not present

## 2016-07-10 DIAGNOSIS — M5136 Other intervertebral disc degeneration, lumbar region: Secondary | ICD-10-CM

## 2016-07-10 DIAGNOSIS — Z419 Encounter for procedure for purposes other than remedying health state, unspecified: Secondary | ICD-10-CM

## 2016-07-10 DIAGNOSIS — Z9889 Other specified postprocedural states: Secondary | ICD-10-CM

## 2016-07-10 LAB — GLUCOSE, CAPILLARY
GLUCOSE-CAPILLARY: 153 mg/dL — AB (ref 65–99)
Glucose-Capillary: 198 mg/dL — ABNORMAL HIGH (ref 65–99)
Glucose-Capillary: 212 mg/dL — ABNORMAL HIGH (ref 65–99)
Glucose-Capillary: 295 mg/dL — ABNORMAL HIGH (ref 65–99)

## 2016-07-10 SURGERY — POSTERIOR LUMBAR FUSION 1 LEVEL
Anesthesia: General | Site: Spine Lumbar

## 2016-07-10 MED ORDER — BACITRACIN ZINC 500 UNIT/GM EX OINT
TOPICAL_OINTMENT | CUTANEOUS | Status: DC | PRN
  Administered 2016-07-10: 1 via TOPICAL

## 2016-07-10 MED ORDER — PHENYLEPHRINE HCL 10 MG/ML IJ SOLN
INTRAVENOUS | Status: DC | PRN
  Administered 2016-07-10: 35 ug/min via INTRAVENOUS

## 2016-07-10 MED ORDER — CYCLOBENZAPRINE HCL 10 MG PO TABS
10.0000 mg | ORAL_TABLET | Freq: Three times a day (TID) | ORAL | Status: DC | PRN
  Administered 2016-07-10 – 2016-07-13 (×6): 10 mg via ORAL
  Filled 2016-07-10 (×6): qty 1

## 2016-07-10 MED ORDER — CEFAZOLIN SODIUM-DEXTROSE 2-4 GM/100ML-% IV SOLN
INTRAVENOUS | Status: AC
  Filled 2016-07-10: qty 100

## 2016-07-10 MED ORDER — SODIUM CHLORIDE 0.9% FLUSH: 3.0000 mL | INTRAVENOUS | Status: DC | PRN

## 2016-07-10 MED ORDER — SUCCINYLCHOLINE CHLORIDE 200 MG/10ML IV SOSY
PREFILLED_SYRINGE | INTRAVENOUS | Status: DC | PRN
  Administered 2016-07-10: 120 mg via INTRAVENOUS

## 2016-07-10 MED ORDER — METOPROLOL TARTRATE 50 MG PO TABS
50.0000 mg | ORAL_TABLET | Freq: Every day | ORAL | Status: DC
  Filled 2016-07-10 (×2): qty 1

## 2016-07-10 MED ORDER — PROMETHAZINE HCL 25 MG/ML IJ SOLN: 6.2500 mg | INTRAMUSCULAR | Status: DC | PRN

## 2016-07-10 MED ORDER — ARTIFICIAL TEARS OP OINT
TOPICAL_OINTMENT | OPHTHALMIC | Status: DC | PRN
  Administered 2016-07-10: 1 via OPHTHALMIC

## 2016-07-10 MED ORDER — ACETAMINOPHEN 650 MG RE SUPP: 650.0000 mg | RECTAL | Status: DC | PRN

## 2016-07-10 MED ORDER — VANCOMYCIN HCL 1000 MG IV SOLR
INTRAVENOUS | Status: DC | PRN
Start: 2016-07-10 — End: 2016-07-10
  Administered 2016-07-10 (×2): 1000 mg via TOPICAL

## 2016-07-10 MED ORDER — SODIUM CHLORIDE 0.9% FLUSH
3.0000 mL | Freq: Two times a day (BID) | INTRAVENOUS | Status: DC
  Administered 2016-07-11 – 2016-07-13 (×5): 3 mL via INTRAVENOUS

## 2016-07-10 MED ORDER — CEFAZOLIN SODIUM 1 G IJ SOLR
INTRAMUSCULAR | Status: DC | PRN
  Administered 2016-07-10: 1 g via INTRAMUSCULAR

## 2016-07-10 MED ORDER — MENTHOL 3 MG MT LOZG: 1.0000 | LOZENGE | OROMUCOSAL | Status: DC | PRN

## 2016-07-10 MED ORDER — BACITRACIN ZINC 500 UNIT/GM EX OINT
TOPICAL_OINTMENT | CUTANEOUS | Status: AC
  Filled 2016-07-10: qty 28.35

## 2016-07-10 MED ORDER — EPHEDRINE SULFATE-NACL 50-0.9 MG/10ML-% IV SOSY
PREFILLED_SYRINGE | INTRAVENOUS | Status: DC | PRN
  Administered 2016-07-10: 10 mg via INTRAVENOUS
  Administered 2016-07-10: 5 mg via INTRAVENOUS
  Administered 2016-07-10 (×2): 10 mg via INTRAVENOUS

## 2016-07-10 MED ORDER — VANCOMYCIN HCL 1000 MG IV SOLR
INTRAVENOUS | Status: AC
  Filled 2016-07-10: qty 2000

## 2016-07-10 MED ORDER — SUGAMMADEX SODIUM 500 MG/5ML IV SOLN
INTRAVENOUS | Status: AC
  Filled 2016-07-10: qty 5

## 2016-07-10 MED ORDER — CEFAZOLIN SODIUM 1 G IJ SOLR
INTRAMUSCULAR | Status: AC
  Filled 2016-07-10: qty 10

## 2016-07-10 MED ORDER — AMLODIPINE BESYLATE 5 MG PO TABS
5.0000 mg | ORAL_TABLET | Freq: Every day | ORAL | Status: DC
  Filled 2016-07-10 (×3): qty 1

## 2016-07-10 MED ORDER — ROCURONIUM BROMIDE 100 MG/10ML IV SOLN
INTRAVENOUS | Status: DC | PRN
  Administered 2016-07-10: 10 mg via INTRAVENOUS
  Administered 2016-07-10: 20 mg via INTRAVENOUS
  Administered 2016-07-10: 10 mg via INTRAVENOUS
  Administered 2016-07-10: 20 mg via INTRAVENOUS
  Administered 2016-07-10: 70 mg via INTRAVENOUS
  Administered 2016-07-10: 10 mg via INTRAVENOUS
  Administered 2016-07-10: 20 mg via INTRAVENOUS

## 2016-07-10 MED ORDER — ALBUMIN HUMAN 5 % IV SOLN
INTRAVENOUS | Status: DC | PRN
  Administered 2016-07-10: 13:00:00 via INTRAVENOUS

## 2016-07-10 MED ORDER — NALOXONE HCL 0.4 MG/ML IJ SOLN: 0.4000 mg | INTRAMUSCULAR | Status: DC | PRN

## 2016-07-10 MED ORDER — ONDANSETRON HCL 4 MG/2ML IJ SOLN
INTRAMUSCULAR | Status: DC | PRN
  Administered 2016-07-10: 4 mg via INTRAVENOUS

## 2016-07-10 MED ORDER — FENTANYL CITRATE (PF) 100 MCG/2ML IJ SOLN
INTRAMUSCULAR | Status: AC
  Filled 2016-07-10: qty 2

## 2016-07-10 MED ORDER — OXYCODONE-ACETAMINOPHEN 5-325 MG PO TABS
2.0000 | ORAL_TABLET | ORAL | Status: DC | PRN
  Administered 2016-07-10 – 2016-07-14 (×15): 2 via ORAL
  Filled 2016-07-10 (×15): qty 2

## 2016-07-10 MED ORDER — LACTATED RINGERS IV SOLN
INTRAVENOUS | Status: DC
  Administered 2016-07-10 (×3): via INTRAVENOUS

## 2016-07-10 MED ORDER — OXYCODONE-ACETAMINOPHEN 5-325 MG PO TABS
ORAL_TABLET | ORAL | Status: AC
  Filled 2016-07-10: qty 2

## 2016-07-10 MED ORDER — DICYCLOMINE HCL 10 MG PO CAPS
10.0000 mg | ORAL_CAPSULE | Freq: Every day | ORAL | Status: DC
  Administered 2016-07-11 – 2016-07-14 (×4): 10 mg via ORAL
  Filled 2016-07-10 (×4): qty 1

## 2016-07-10 MED ORDER — PROPOFOL 10 MG/ML IV BOLUS
INTRAVENOUS | Status: AC
  Filled 2016-07-10: qty 20

## 2016-07-10 MED ORDER — FENTANYL 40 MCG/ML IV SOLN
INTRAVENOUS | Status: AC
  Filled 2016-07-10: qty 25

## 2016-07-10 MED ORDER — ROCURONIUM BROMIDE 10 MG/ML (PF) SYRINGE
PREFILLED_SYRINGE | INTRAVENOUS | Status: AC
  Filled 2016-07-10: qty 10

## 2016-07-10 MED ORDER — PROPOFOL 10 MG/ML IV BOLUS
INTRAVENOUS | Status: DC | PRN
  Administered 2016-07-10: 200 mg via INTRAVENOUS

## 2016-07-10 MED ORDER — ONDANSETRON HCL 4 MG/2ML IJ SOLN: 4.0000 mg | Freq: Four times a day (QID) | INTRAMUSCULAR | Status: DC | PRN

## 2016-07-10 MED ORDER — ACETAMINOPHEN 325 MG PO TABS: 650.0000 mg | ORAL_TABLET | ORAL | Status: DC | PRN

## 2016-07-10 MED ORDER — THROMBIN 20000 UNITS EX SOLR
CUTANEOUS | Status: AC
  Filled 2016-07-10: qty 20000

## 2016-07-10 MED ORDER — FENTANYL CITRATE (PF) 100 MCG/2ML IJ SOLN
INTRAMUSCULAR | Status: AC
  Filled 2016-07-10: qty 4

## 2016-07-10 MED ORDER — SODIUM CHLORIDE 0.9 % IV SOLN: 250.0000 mL | INTRAVENOUS | Status: DC

## 2016-07-10 MED ORDER — CEFAZOLIN SODIUM 10 G IJ SOLR
3.0000 g | INTRAMUSCULAR | Status: AC
  Administered 2016-07-10: 3 g via INTRAVENOUS
  Filled 2016-07-10: qty 3000

## 2016-07-10 MED ORDER — CYCLOBENZAPRINE HCL 10 MG PO TABS
ORAL_TABLET | ORAL | Status: AC
  Administered 2016-07-10: 10 mg
  Filled 2016-07-10: qty 1

## 2016-07-10 MED ORDER — CEFAZOLIN IN D5W 1 GM/50ML IV SOLN
1.0000 g | Freq: Three times a day (TID) | INTRAVENOUS | Status: AC
  Administered 2016-07-10 – 2016-07-11 (×2): 1 g via INTRAVENOUS
  Filled 2016-07-10 (×2): qty 50

## 2016-07-10 MED ORDER — FENTANYL CITRATE (PF) 100 MCG/2ML IJ SOLN
INTRAMUSCULAR | Status: DC | PRN
  Administered 2016-07-10: 100 ug via INTRAVENOUS
  Administered 2016-07-10 (×2): 50 ug via INTRAVENOUS
  Administered 2016-07-10 (×2): 100 ug via INTRAVENOUS

## 2016-07-10 MED ORDER — MEPERIDINE HCL 25 MG/ML IJ SOLN: 6.2500 mg | INTRAMUSCULAR | Status: DC | PRN

## 2016-07-10 MED ORDER — CEFAZOLIN SODIUM-DEXTROSE 2-3 GM-% IV SOLR
INTRAVENOUS | Status: DC | PRN
  Administered 2016-07-10: 2 g via INTRAVENOUS

## 2016-07-10 MED ORDER — ARTIFICIAL TEARS OP OINT
TOPICAL_OINTMENT | OPHTHALMIC | Status: AC
  Filled 2016-07-10: qty 3.5

## 2016-07-10 MED ORDER — SODIUM CHLORIDE 0.9% FLUSH: 9.0000 mL | INTRAVENOUS | Status: DC | PRN

## 2016-07-10 MED ORDER — SUGAMMADEX SODIUM 200 MG/2ML IV SOLN
INTRAVENOUS | Status: DC | PRN
  Administered 2016-07-10: 300 mg via INTRAVENOUS

## 2016-07-10 MED ORDER — BUPIVACAINE LIPOSOME 1.3 % IJ SUSP
INTRAMUSCULAR | Status: DC | PRN
  Administered 2016-07-10: 20 mL

## 2016-07-10 MED ORDER — NITROGLYCERIN 0.4 MG SL SUBL: 0.4000 mg | SUBLINGUAL_TABLET | SUBLINGUAL | Status: DC | PRN

## 2016-07-10 MED ORDER — ZOLPIDEM TARTRATE 5 MG PO TABS: 5.0000 mg | ORAL_TABLET | Freq: Every evening | ORAL | Status: DC | PRN

## 2016-07-10 MED ORDER — INSULIN ASPART 100 UNIT/ML ~~LOC~~ SOLN
0.0000 [IU] | Freq: Three times a day (TID) | SUBCUTANEOUS | Status: DC
  Administered 2016-07-11: 4 [IU] via SUBCUTANEOUS
  Administered 2016-07-11 (×2): 7 [IU] via SUBCUTANEOUS
  Administered 2016-07-12 – 2016-07-13 (×6): 4 [IU] via SUBCUTANEOUS
  Administered 2016-07-14: 1 [IU] via SUBCUTANEOUS

## 2016-07-10 MED ORDER — DIPHENHYDRAMINE HCL 12.5 MG/5ML PO ELIX: 12.5000 mg | ORAL_SOLUTION | Freq: Four times a day (QID) | ORAL | Status: DC | PRN

## 2016-07-10 MED ORDER — SODIUM CHLORIDE 0.9 % IV SOLN
INTRAVENOUS | Status: DC | PRN
  Administered 2016-07-10: 13:00:00 via INTRAVENOUS

## 2016-07-10 MED ORDER — PANTOPRAZOLE SODIUM 40 MG PO TBEC
40.0000 mg | DELAYED_RELEASE_TABLET | Freq: Every day | ORAL | Status: DC
  Administered 2016-07-11 – 2016-07-14 (×4): 40 mg via ORAL
  Filled 2016-07-10 (×4): qty 1

## 2016-07-10 MED ORDER — DOCUSATE SODIUM 100 MG PO CAPS
100.0000 mg | ORAL_CAPSULE | Freq: Two times a day (BID) | ORAL | Status: DC
  Administered 2016-07-11 – 2016-07-14 (×7): 100 mg via ORAL
  Filled 2016-07-10 (×7): qty 1

## 2016-07-10 MED ORDER — 0.9 % SODIUM CHLORIDE (POUR BTL) OPTIME
TOPICAL | Status: DC | PRN
  Administered 2016-07-10: 1000 mL

## 2016-07-10 MED ORDER — DIPHENHYDRAMINE HCL 50 MG/ML IJ SOLN: 12.5000 mg | Freq: Four times a day (QID) | INTRAMUSCULAR | Status: DC | PRN

## 2016-07-10 MED ORDER — LIDOCAINE 2% (20 MG/ML) 5 ML SYRINGE
INTRAMUSCULAR | Status: DC | PRN
  Administered 2016-07-10: 60 mg via INTRAVENOUS

## 2016-07-10 MED ORDER — ASPIRIN EC 81 MG PO TBEC
81.0000 mg | DELAYED_RELEASE_TABLET | Freq: Every day | ORAL | Status: DC
  Administered 2016-07-11 – 2016-07-14 (×4): 81 mg via ORAL
  Filled 2016-07-10 (×4): qty 1

## 2016-07-10 MED ORDER — PHENOL 1.4 % MT LIQD: 1.0000 | OROMUCOSAL | Status: DC | PRN

## 2016-07-10 MED ORDER — THROMBIN 20000 UNITS EX SOLR
CUTANEOUS | Status: DC | PRN
  Administered 2016-07-10: 11:00:00 via TOPICAL

## 2016-07-10 MED ORDER — BUPIVACAINE LIPOSOME 1.3 % IJ SUSP
20.0000 mL | Freq: Once | INTRAMUSCULAR | Status: DC
  Filled 2016-07-10: qty 20

## 2016-07-10 MED ORDER — LACTATED RINGERS IV SOLN: INTRAVENOUS | Status: DC

## 2016-07-10 MED ORDER — HYDROMORPHONE HCL 1 MG/ML IJ SOLN: 0.2500 mg | INTRAMUSCULAR | Status: DC | PRN

## 2016-07-10 MED ORDER — FENTANYL 40 MCG/ML IV SOLN
INTRAVENOUS | Status: DC
  Administered 2016-07-10: 30 ug via INTRAVENOUS
  Administered 2016-07-10: 360 ug via INTRAVENOUS
  Administered 2016-07-10: 15:00:00 via INTRAVENOUS
  Administered 2016-07-11: 25 mL via INTRAVENOUS
  Administered 2016-07-11: 30 ug via INTRAVENOUS
  Administered 2016-07-11: 135 ug via INTRAVENOUS
  Administered 2016-07-11: 405 ug via INTRAVENOUS
  Filled 2016-07-10: qty 25

## 2016-07-10 MED ORDER — ONDANSETRON HCL 4 MG/2ML IJ SOLN
4.0000 mg | INTRAMUSCULAR | Status: DC | PRN
  Administered 2016-07-10: 4 mg via INTRAVENOUS
  Filled 2016-07-10: qty 2

## 2016-07-10 MED ORDER — BUPIVACAINE HCL (PF) 0.5 % IJ SOLN
INTRAMUSCULAR | Status: AC
  Filled 2016-07-10: qty 30

## 2016-07-10 MED ORDER — MIDAZOLAM HCL 2 MG/2ML IJ SOLN
INTRAMUSCULAR | Status: DC | PRN
  Administered 2016-07-10: 2 mg via INTRAVENOUS

## 2016-07-10 MED ORDER — CHLORTHALIDONE 25 MG PO TABS
25.0000 mg | ORAL_TABLET | Freq: Every day | ORAL | Status: DC
  Administered 2016-07-11 – 2016-07-12 (×2): 25 mg via ORAL
  Filled 2016-07-10 (×4): qty 1

## 2016-07-10 MED ORDER — MIDAZOLAM HCL 2 MG/2ML IJ SOLN
INTRAMUSCULAR | Status: AC
  Filled 2016-07-10: qty 2

## 2016-07-10 MED ORDER — INSULIN ASPART 100 UNIT/ML ~~LOC~~ SOLN
0.0000 [IU] | Freq: Every day | SUBCUTANEOUS | Status: DC
  Administered 2016-07-10: 3 [IU] via SUBCUTANEOUS

## 2016-07-10 MED ORDER — SODIUM CHLORIDE 0.9 % IV SOLN
INTRAVENOUS | Status: DC
  Administered 2016-07-10 – 2016-07-12 (×2): via INTRAVENOUS

## 2016-07-10 MED FILL — Sodium Chloride IV Soln 0.9%: INTRAVENOUS | Qty: 2000 | Status: AC

## 2016-07-10 MED FILL — Heparin Sodium (Porcine) Inj 1000 Unit/ML: INTRAMUSCULAR | Qty: 30 | Status: AC

## 2016-07-10 SURGICAL SUPPLY — 65 items
APL SKNCLS STERI-STRIP NONHPOA (GAUZE/BANDAGES/DRESSINGS) ×1
BENZOIN TINCTURE PRP APPL 2/3 (GAUZE/BANDAGES/DRESSINGS) ×2 IMPLANT
BUR ACORN 6.0 (BURR) ×3 IMPLANT
BUR MATCHSTICK NEURO 3.0 LAGG (BURR) ×3 IMPLANT
CANISTER SUCT 3000ML PPV (MISCELLANEOUS) ×2 IMPLANT
CAP LOCKING THREADED (Cap) ×4 IMPLANT
CONT SPEC 4OZ CLIKSEAL STRL BL (MISCELLANEOUS) ×3 IMPLANT
COVER BACK TABLE 60X90IN (DRAPES) ×2 IMPLANT
DRAPE C-ARM 42X72 X-RAY (DRAPES) ×4 IMPLANT
DRAPE C-ARMOR (DRAPES) ×1 IMPLANT
DRAPE LAPAROTOMY 100X72X124 (DRAPES) ×2 IMPLANT
DRAPE POUCH INSTRU U-SHP 10X18 (DRAPES) ×2 IMPLANT
DRSG OPSITE 4X5.5 SM (GAUZE/BANDAGES/DRESSINGS) ×1 IMPLANT
DRSG OPSITE POSTOP 4X10 (GAUZE/BANDAGES/DRESSINGS) ×1 IMPLANT
DURAPREP 26ML APPLICATOR (WOUND CARE) ×2 IMPLANT
ELECT BLADE 4.0 EZ CLEAN MEGAD (MISCELLANEOUS) ×2
ELECT REM PT RETURN 9FT ADLT (ELECTROSURGICAL) ×2
ELECTRODE BLDE 4.0 EZ CLN MEGD (MISCELLANEOUS) IMPLANT
ELECTRODE REM PT RTRN 9FT ADLT (ELECTROSURGICAL) ×1 IMPLANT
EVACUATOR 1/8 PVC DRAIN (DRAIN) IMPLANT
EVACUATOR 3/16  PVC DRAIN (DRAIN) ×1
EVACUATOR 3/16 PVC DRAIN (DRAIN) IMPLANT
GAUZE SPONGE 4X4 12PLY STRL (GAUZE/BANDAGES/DRESSINGS) ×2 IMPLANT
GAUZE SPONGE 4X4 16PLY XRAY LF (GAUZE/BANDAGES/DRESSINGS) ×2 IMPLANT
GLOVE BIOGEL M 8.0 STRL (GLOVE) ×3 IMPLANT
GLOVE BIOGEL PI IND STRL 7.0 (GLOVE) IMPLANT
GLOVE BIOGEL PI IND STRL 7.5 (GLOVE) IMPLANT
GLOVE BIOGEL PI IND STRL 8 (GLOVE) IMPLANT
GLOVE BIOGEL PI INDICATOR 7.0 (GLOVE) ×2
GLOVE BIOGEL PI INDICATOR 7.5 (GLOVE) ×1
GLOVE BIOGEL PI INDICATOR 8 (GLOVE) ×2
GLOVE ECLIPSE 7.0 STRL STRAW (GLOVE) ×1 IMPLANT
GLOVE ECLIPSE 7.5 STRL STRAW (GLOVE) ×1 IMPLANT
GLOVE SURG SS PI 7.5 STRL IVOR (GLOVE) ×1 IMPLANT
GOWN STRL REUS W/ TWL LRG LVL3 (GOWN DISPOSABLE) ×1 IMPLANT
GOWN STRL REUS W/ TWL XL LVL3 (GOWN DISPOSABLE) IMPLANT
GOWN STRL REUS W/TWL 2XL LVL3 (GOWN DISPOSABLE) ×1 IMPLANT
GOWN STRL REUS W/TWL LRG LVL3 (GOWN DISPOSABLE) ×2
GOWN STRL REUS W/TWL XL LVL3 (GOWN DISPOSABLE) ×2
KIT BASIN OR (CUSTOM PROCEDURE TRAY) ×2 IMPLANT
KIT INFUSE MEDIUM (Orthopedic Implant) ×1 IMPLANT
KIT ROOM TURNOVER OR (KITS) ×2 IMPLANT
NDL HYPO 18GX1.5 BLUNT FILL (NEEDLE) IMPLANT
NDL HYPO 21X1.5 SAFETY (NEEDLE) IMPLANT
NDL HYPO 25X1 1.5 SAFETY (NEEDLE) IMPLANT
NEEDLE HYPO 18GX1.5 BLUNT FILL (NEEDLE) ×2 IMPLANT
NEEDLE HYPO 21X1.5 SAFETY (NEEDLE) ×2 IMPLANT
NEEDLE HYPO 25X1 1.5 SAFETY (NEEDLE) ×2 IMPLANT
NS IRRIG 1000ML POUR BTL (IV SOLUTION) ×2 IMPLANT
PACK LAMINECTOMY NEURO (CUSTOM PROCEDURE TRAY) ×2 IMPLANT
PAD ARMBOARD 7.5X6 YLW CONV (MISCELLANEOUS) ×6 IMPLANT
ROD CREO 45MM SPINAL (Rod) ×2 IMPLANT
SCREW CREO THREADED 5.5X45MM (Screw) ×2 IMPLANT
SCREW CREO THREADED 5.5X55MM (Screw) ×2 IMPLANT
SPONGE SURGIFOAM ABS GEL 100 (HEMOSTASIS) ×2 IMPLANT
STRIP CLOSURE SKIN 1/2X4 (GAUZE/BANDAGES/DRESSINGS) ×2 IMPLANT
SUT VIC AB 1 CT1 18XBRD ANBCTR (SUTURE) ×2 IMPLANT
SUT VIC AB 1 CT1 8-18 (SUTURE) ×4
SUT VIC AB 2-0 CP2 18 (SUTURE) ×3 IMPLANT
SUT VIC AB 3-0 SH 8-18 (SUTURE) ×3 IMPLANT
SYR 5ML LL (SYRINGE) ×1 IMPLANT
TOWEL OR 17X24 6PK STRL BLUE (TOWEL DISPOSABLE) ×2 IMPLANT
TOWEL OR 17X26 10 PK STRL BLUE (TOWEL DISPOSABLE) ×2 IMPLANT
TRAY FOLEY W/METER SILVER 16FR (SET/KITS/TRAYS/PACK) ×2 IMPLANT
WATER STERILE IRR 1000ML POUR (IV SOLUTION) ×2 IMPLANT

## 2016-07-10 NOTE — H&P (Signed)
NAME:  Billy Davis, PFLUGER NO.:  [id number]  MEDICAL RECORD NO.:  [id number]  LOCATION:  PERIO                        FACILITY:  [facility]  PHYSICIAN:  Leeroy Cha, M.D.   DATE OF BIRTH:  [birth year]  DATE OF ADMISSION:  07/10/2016 DATE OF DISCHARGE:                             HISTORY & PHYSICAL   HISTORY OF PRESENT ILLNESS:  Mr. Guard is a gentleman, who was seen in my office complaining of back pain radiation to both legs, the right worse than the left one up to the point and then when he walks, he had to sit because he developed severe numbness in the right foot.  He is quite miserable.  He feels that he is getting worse since last time I saw him 3 years ago.  PAST MEDICAL HISTORY:  Positive for cancer of the prostate, diabetes, back pain, leg pain, high blood pressure, hearing loss, ring in the ear.  PRIOR SURGICAL INTERVENTION:  He has prostate surgery, shoulder surgery, cholecystectomy.  ALLERGIES:  He is allergic to BLOOD THINNERS.  FAMILY HISTORY:  Unremarkable.  REVIEW OF SYSTEMS:  The patient does not smoke, does not drink.  PHYSICAL EXAMINATION:  GENERAL:  The patient, who came to my office limping from the right leg. HEAD, EARS AND THROAT:  Normal. NECK:  Normal. LUNGS:  Clear. CARDIOVASCULAR:  Normal. ABDOMEN:  Normal. EXTREMITIES:  Grade 1/grade 2 edema in both lower extremities. NEUROLOGIC:  Mental status normal.  Cranial nerves normal.  He has weakness of dorsiflexion of the right foot.  Complaining of tingling sensation on both legs.  The straight leg raising, SLR is positive on the left side about 50 degrees, 80 degrees in the right side.  IMAGING:  The lumbar spine x-rays show a step-off, spondylolisthesis, at the level of L5-S1.  The MRI shows stenosis all the way down from L3-4, L4-5 and L5-S1, above the line between L2-3.  CLINICAL IMPRESSION:  Lumbar stenosis, neurogenic claudication, lumbar spondylolisthesis at  L5-S1.  RECOMMENDATION:  I talked to him at length in my office.  He wants to proceed with surgery.  The procedure will be partial total laminectomy of L2 all the way down to L5 with decompression and fusion at the level of L5-S1 using cages and pedicle screws.  The patient is fully aware of the risk with the surgery such as infection, CSF leak, bleeding, worsening of the pain, need of further surgery, hematoma and no improvement whatsoever.          ______________________________ Leeroy Cha, M.D.     EB/MEDQ  D:  07/09/2016  T:  07/10/2016  Job:  [id number]

## 2016-07-10 NOTE — Progress Notes (Signed)
Patient complained of numbness on the right hand specially the 2 fingers thumb and index.  Hands warm to touch with good capillary refill, amd radial pulse palpable. Dr. Maury Dus made aware no further orders made. Will continue to monitor patient.

## 2016-07-10 NOTE — Anesthesia Preprocedure Evaluation (Addendum)
Anesthesia Evaluation  Patient identified by MRN, date of birth, ID band Patient awake    Reviewed: Allergy & Precautions, NPO status , Patient's Chart, lab work & pertinent test results, reviewed documented beta blocker date and time   Airway Mallampati: I  TM Distance: <3 FB Neck ROM: Full    Dental  (+) Teeth Intact, Dental Advisory Given   Pulmonary former smoker,    breath sounds clear to auscultation       Cardiovascular hypertension, Pt. on medications and Pt. on home beta blockers + CAD   Rhythm:Regular Rate:Normal     Neuro/Psych negative neurological ROS  negative psych ROS   GI/Hepatic Neg liver ROS, GERD  Medicated,  Endo/Other  diabetes  Renal/GU negative Renal ROS  negative genitourinary   Musculoskeletal negative musculoskeletal ROS (+)   Abdominal   Peds negative pediatric ROS (+)  Hematology negative hematology ROS (+)   Anesthesia Other Findings   Reproductive/Obstetrics negative OB ROS                           Anesthesia Physical Anesthesia Plan  ASA: III  Anesthesia Plan: General   Post-op Pain Management:    Induction: Intravenous  Airway Management Planned: Oral ETT  Additional Equipment:   Intra-op Plan:   Post-operative Plan: Extubation in OR  Informed Consent: I have reviewed the patients History and Physical, chart, labs and discussed the procedure including the risks, benefits and alternatives for the proposed anesthesia with the patient or authorized representative who has indicated his/her understanding and acceptance.   Dental advisory given  Plan Discussed with: CRNA  Anesthesia Plan Comments:         Anesthesia Quick Evaluation

## 2016-07-10 NOTE — Transfer of Care (Signed)
Immediate Anesthesia Transfer of Care Note  Patient: Billy Davis  Procedure(s) Performed: Procedure(s) with comments: LUMBAR TWO-LUMBAR FIVE LAMINECTOMY WITH LUMBAR FIVE-SACRAL ONE FUSION (N/A) - L2 to L5 Laminectomy with L5-S1 Fusion/cages/screws  Patient Location: PACU  Anesthesia Type:General  Level of Consciousness: awake, alert  and oriented  Airway & Oxygen Therapy: Patient Spontanous Breathing and Patient connected to face mask oxygen  Post-op Assessment: Report given to RN, Post -op Vital signs reviewed and stable and Patient moving all extremities  Post vital signs: Reviewed and stable  Last Vitals:  Vitals:   07/10/16 0727  BP: 139/78  Pulse: 67  Resp: 18  Temp: 36.8 C    Last Pain:  Vitals:   07/10/16 0727  TempSrc: Oral      Patients Stated Pain Goal: 4 (07/62/26 3335)  Complications: No apparent anesthesia complications

## 2016-07-10 NOTE — Anesthesia Postprocedure Evaluation (Signed)
Anesthesia Post Note  Patient: Billy Davis  Procedure(s) Performed: Procedure(s) (LRB): LUMBAR TWO-LUMBAR FIVE LAMINECTOMY WITH LUMBAR FIVE-SACRAL ONE FUSION (N/A)  Patient location during evaluation: PACU Anesthesia Type: General Level of consciousness: awake and alert Pain management: pain level controlled Vital Signs Assessment: post-procedure vital signs reviewed and stable Respiratory status: spontaneous breathing, nonlabored ventilation, respiratory function stable and patient connected to nasal cannula oxygen Cardiovascular status: blood pressure returned to baseline and stable Postop Assessment: no signs of nausea or vomiting Anesthetic complications: no    Last Vitals:  Vitals:   07/10/16 1530 07/10/16 1545  BP: 100/65   Pulse: 86   Resp: 16   Temp:  36.7 C    Last Pain:  Vitals:   07/10/16 1506  TempSrc:   PainSc: 10-Worst pain ever                 Effie Berkshire

## 2016-07-10 NOTE — Anesthesia Procedure Notes (Signed)
Procedure Name: Intubation Date/Time: 07/10/2016 9:22 AM Performed by: Suella Broad D Pre-anesthesia Checklist: Patient identified, Emergency Drugs available, Suction available and Patient being monitored Patient Re-evaluated:Patient Re-evaluated prior to inductionOxygen Delivery Method: Circle System Utilized Preoxygenation: Pre-oxygenation with 100% oxygen Intubation Type: IV induction Ventilation: Oral airway inserted - appropriate to patient size and Mask ventilation with difficulty Laryngoscope Size: Mac and 4 Grade View: Grade III Tube type: Oral Tube size: 7.5 mm Number of attempts: 2 (DLx1 CRNA DLx1 MDA) Airway Equipment and Method: Stylet and Oral airway Placement Confirmation: ETT inserted through vocal cords under direct vision,  positive ETCO2 and breath sounds checked- equal and bilateral Secured at: 23 cm Tube secured with: Tape Dental Injury: Teeth and Oropharynx as per pre-operative assessment  Difficulty Due To: Difficult Airway- due to large tongue and Difficult Airway- due to anterior larynx

## 2016-07-11 LAB — CBC WITH DIFFERENTIAL/PLATELET
BASOS PCT: 0 %
Basophils Absolute: 0 10*3/uL (ref 0.0–0.1)
EOS ABS: 0 10*3/uL (ref 0.0–0.7)
EOS PCT: 0 %
HCT: 31.3 % — ABNORMAL LOW (ref 39.0–52.0)
HEMOGLOBIN: 10.6 g/dL — AB (ref 13.0–17.0)
LYMPHS ABS: 2.3 10*3/uL (ref 0.7–4.0)
Lymphocytes Relative: 20 %
MCH: 28.2 pg (ref 26.0–34.0)
MCHC: 33.9 g/dL (ref 30.0–36.0)
MCV: 83.2 fL (ref 78.0–100.0)
MONO ABS: 1.1 10*3/uL — AB (ref 0.1–1.0)
MONOS PCT: 10 %
Neutro Abs: 7.9 10*3/uL — ABNORMAL HIGH (ref 1.7–7.7)
Neutrophils Relative %: 70 %
Platelets: 123 10*3/uL — ABNORMAL LOW (ref 150–400)
RBC: 3.76 MIL/uL — ABNORMAL LOW (ref 4.22–5.81)
RDW: 13.8 % (ref 11.5–15.5)
WBC: 11.4 10*3/uL — ABNORMAL HIGH (ref 4.0–10.5)

## 2016-07-11 LAB — GLUCOSE, CAPILLARY
GLUCOSE-CAPILLARY: 194 mg/dL — AB (ref 65–99)
GLUCOSE-CAPILLARY: 237 mg/dL — AB (ref 65–99)
Glucose-Capillary: 222 mg/dL — ABNORMAL HIGH (ref 65–99)
Glucose-Capillary: 174 mg/dL — ABNORMAL HIGH (ref 65–99)

## 2016-07-11 MED ORDER — MORPHINE SULFATE 2 MG/ML IV SOLN
INTRAVENOUS | Status: DC
  Administered 2016-07-11: 3 mg via INTRAVENOUS
  Administered 2016-07-11: 13:00:00 via INTRAVENOUS
  Administered 2016-07-12: 3 mg via INTRAVENOUS
  Administered 2016-07-12 (×2): 0 mg via INTRAVENOUS
  Administered 2016-07-13: 3 mg via INTRAVENOUS
  Filled 2016-07-11: qty 25

## 2016-07-11 MED ORDER — SODIUM CHLORIDE 0.9% FLUSH: 9.0000 mL | INTRAVENOUS | Status: DC | PRN

## 2016-07-11 MED ORDER — DIPHENHYDRAMINE HCL 50 MG/ML IJ SOLN: 12.5000 mg | Freq: Four times a day (QID) | INTRAMUSCULAR | Status: DC | PRN

## 2016-07-11 MED ORDER — NALOXONE HCL 0.4 MG/ML IJ SOLN: 0.4000 mg | INTRAMUSCULAR | Status: DC | PRN

## 2016-07-11 MED ORDER — DIPHENHYDRAMINE HCL 12.5 MG/5ML PO ELIX: 12.5000 mg | ORAL_SOLUTION | Freq: Four times a day (QID) | ORAL | Status: DC | PRN

## 2016-07-11 MED ORDER — ONDANSETRON HCL 4 MG/2ML IJ SOLN: 4.0000 mg | Freq: Four times a day (QID) | INTRAMUSCULAR | Status: DC | PRN

## 2016-07-11 MED ORDER — SODIUM CHLORIDE 0.9 % IV BOLUS (SEPSIS)
1000.0000 mL | Freq: Once | INTRAVENOUS | Status: AC
  Administered 2016-07-11: 1000 mL via INTRAVENOUS

## 2016-07-11 NOTE — Progress Notes (Signed)
Patient ID: Billy Davis, male   DOB: Oct 16, 1955, 60 y.o.   MRN: [id number] C/o incisional pain. Off and on numbness left foot. hemovac draining. BP lows 80. Mentally normal. He isNOT  ALLERGIC TO MORPHINE

## 2016-07-11 NOTE — Evaluation (Signed)
Physical Therapy Evaluation Patient Details Name: Billy Davis MRN: [id number] DOB: 1955-12-31 Today's Date: 07/11/2016   History of Present Illness  pt presents post L2-5 Foraminectomy, L3-5 Lami, and L5-S1 Fusion.  pt with hx of Prostate CA, DM, HTN, and hearing loss.    Clinical Impression  Pt needs encouragement for mobility and requires A for all aspects of mobility.  Pt's BP sitting EOB 113/82, after SPT to recliner 89/59, after sitting 3 mins 91/39, and after sitting 5 mins 106/84.  Pt adamant about not remaining up in chair due to pain in his back, so A pt back to bed and RN made aware of pain.  Feel with better pain control and increased BPs, pt will be able to progress with mobility to return to home.  Will continue to follow.      Follow Up Recommendations Home health PT;Supervision/Assistance - 24 hour    Equipment Recommendations  Rolling walker with 5" wheels;3in1 (PT)    Recommendations for Other Services       Precautions / Restrictions Precautions Precautions: Back Precaution Booklet Issued: Yes (comment) Precaution Comments: Reviewed back precautions. Required Braces or Orthoses: Spinal Brace Spinal Brace: Lumbar corset;Applied in sitting position Restrictions Weight Bearing Restrictions: No      Mobility  Bed Mobility Overal bed mobility: Needs Assistance Bed Mobility: Rolling;Sidelying to Sit;Sit to Sidelying Rolling: Supervision Sidelying to sit: Mod assist;+2 for physical assistance     Sit to sidelying: Mod assist;+2 for physical assistance General bed mobility comments: cues for log roll technique and A throughout bed mobility.    Transfers Overall transfer level: Needs assistance Equipment used: 4-wheeled walker Transfers: Sit to/from [facility] Sit to Stand: Mod assist Stand pivot transfers: Mod assist       General transfer comment: pt initially MinA with coming to standing, but then required [place] for 2nd time coming to  standing.  pt needs A for management of walker during transfer.    Ambulation/Gait                Stairs            Wheelchair Mobility    Modified Rankin (Stroke Patients Only)       Balance Overall balance assessment: Needs assistance Sitting-balance support: Bilateral upper extremity supported;Feet supported Sitting balance-Leahy Scale: Poor     Standing balance support: Bilateral upper extremity supported;During functional activity Standing balance-Leahy Scale: Poor                               Pertinent Vitals/Pain Pain Assessment: 0-10 Pain Score: 9  Pain Location: Back Pain Descriptors / Indicators: Aching;Grimacing;Guarding Pain Intervention(s): Monitored during session;Repositioned;Premedicated before session;Patient requesting pain meds-RN notified    Home Living Family/patient expects to be discharged to:: Private residence Living Arrangements: Spouse/significant other Available Help at Discharge: Family Type of Home: House Home Access: Stairs to enter   [employer] of Steps: 3 Home Layout: One level Home Equipment: [place] - single point;Adaptive equipment      Prior Function Level of Independence: Independent with assistive device(s)   Gait / Transfers Assistance Needed: used cane when back pain increased           Hand Dominance   Dominant Hand: Right    Extremity/Trunk Assessment   Upper Extremity Assessment: Defer to OT evaluation           Lower Extremity Assessment: Generalized weakness  Cervical / Trunk Assessment: Normal  Communication   Communication: No difficulties  Cognition Arousal/Alertness: Awake/alert Behavior During Therapy: WFL for tasks assessed/performed Overall Cognitive Status: Within Functional Limits for tasks assessed                      General Comments      Exercises     Assessment/Plan    PT Assessment Patient needs continued PT services  PT  Problem List Decreased strength;Decreased activity tolerance;Decreased balance;Decreased mobility;Decreased knowledge of use of DME;Decreased knowledge of precautions;Obesity;Pain          PT Treatment Interventions DME instruction;Gait training;Stair training;Functional mobility training;Therapeutic activities;Therapeutic exercise;Balance training;Neuromuscular re-education;Patient/family education    PT Goals (Current goals can be found in the Care Plan section)  Acute Rehab PT Goals Patient Stated Goal: get better and go home PT Goal Formulation: With patient Time For Goal Achievement: 07/18/16 Potential to Achieve Goals: Good    Frequency Min 5X/week   Barriers to discharge        Co-evaluation PT/OT/SLP Co-Evaluation/Treatment: Yes Reason for Co-Treatment: Complexity of the patient's impairments (multi-system involvement);For patient/therapist safety PT goals addressed during session: Mobility/safety with mobility;Balance;Proper use of DME OT goals addressed during session: ADL's and self-care       End of Session Equipment Utilized During Treatment: Gait belt;Back brace Activity Tolerance: Treatment limited secondary to medical complications (Comment) (Low BP and Pain) Patient left: in bed;with call bell/phone within reach;with nursing/sitter in room Nurse Communication: Mobility status         Time: 1152-1229 PT Time Calculation (min) (ACUTE ONLY): 37 min   Charges:   PT Evaluation $PT Eval Moderate Complexity: 1 Procedure     PT G CodesCatarina Hartshorn, PT  [phone] 07/11/2016, 4:17 PM

## 2016-07-11 NOTE — Progress Notes (Signed)
Patients BP low, asymptomatic.  Pt uses PCA quite frequently.  NS bolus given.  Awaiting MD to round.  Will continue to monitor.  Cori Razor, RN

## 2016-07-11 NOTE — Evaluation (Addendum)
Occupational Therapy Evaluation Patient Details Name: RACHAEL ZAPANTA MRN: [id number] DOB: [birth year] Today's Date: 07/11/2016    History of Present Illness pt presents post L2-5 Foraminectomy, L3-5 Lami, and L5-S1 Fusion.  pt with hx of Prostate CA, DM, HTN, and hearing loss.     Clinical Impression   Pt with decline in function and safety with ADLs and ADL mobility with decreased strength, balance and endurance. Pt is limited by pain, reports of dizziness and nausea. Pt's BP fluctuating during session and pt returned to bed after sitting up in recliner < 10 minutes. After sup - sit to EOB BP 113/82, after transfer to chair 89/59; 91/39 - 106/84 after sitting several minutes. Pt's RN notified and in to see pt. Pt would benefit from acute OT services to address impairments to increase level of function and safety    Follow Up Recommendations  Home health OT    Equipment Recommendations  3 in 1 bedside comode;Tub/shower seat;Other (comment) (ADL A/E kit)    Recommendations for Other Services       Precautions / Restrictions Precautions Precautions: Back Precaution Booklet Issued: Yes (comment) Precaution Comments: Reviewed back precautions. Required Braces or Orthoses: Spinal Brace Spinal Brace: Lumbar corset;Applied in sitting position Restrictions Weight Bearing Restrictions: No      Mobility Bed Mobility Overal bed mobility: Needs Assistance Bed Mobility: Rolling;Sidelying to Sit Rolling: Supervision Sidelying to sit: Mod assist;+2 for physical assistance       General bed mobility comments: assist to elevate trunk  Transfers Overall transfer level: Needs assistance Equipment used: 4-wheeled walker (unable to locate 2W RW) Transfers: Sit to/from Stand Sit to Stand: Min assist;Mod assist         General transfer comment: min A with first sit - stand, mod A with second sit - stand    Balance Overall balance assessment: Needs assistance   Sitting  balance-Leahy Scale: Fair       Standing balance-Leahy Scale: Poor                              ADL Overall ADL's : Needs assistance/impaired     Grooming: Wash/dry hands;Wash/dry face;Sitting;Set up;Supervision/safety   Upper Body Bathing: Min guard;Sitting   Lower Body Bathing: Maximal assistance   Upper Body Dressing : Min guard;Sitting   Lower Body Dressing: Total assistance   Toilet Transfer: Minimal assistance;Moderate assistance Toilet Transfer Details (indicate cue type and reason): pt also stood with min A + 2 to use urinla at bedside Toileting- Water quality scientist and Hygiene: Total assistance       Functional mobility during ADLs: Minimal assistance;Moderate assistance General ADL Comments: Pt very limityed by pain, reports dizizness and nausea, BP dropping     Vision  no change in baseline              Pertinent Vitals/Pain Pain Assessment: 0-10 Pain Location: 2/10 before acitvity, 9/10 after activity Pain Descriptors / Indicators: Aching;Grimacing;Guarding;Sore;Operative site guarding Pain Intervention(s): Limited activity within patient's tolerance;Monitored during session;Repositioned;Patient requesting pain meds-RN notified     Hand Dominance  Right   Extremity/Trunk Assessment Upper Extremity Assessment Upper Extremity Assessment: Overall WFL for tasks assessed       Cervical / Trunk Assessment Cervical / Trunk Assessment: Normal   Communication     Cognition Arousal/Alertness: Awake/alert Behavior During Therapy: WFL for tasks assessed/performed Overall Cognitive Status: Within Functional Limits for tasks assessed  General Comments   Pt pleasant and cooperative                 Home Living Family/patient expects to be discharged to:: Private residence Living Arrangements: Spouse/significant other Available Help at Discharge: Family Type of Home: House Home Access: Stairs to  enter Technical brewer of Steps: 3   [place]: One level     Bathroom Shower/Tub: Teacher, early years/pre: [place] - single point;Adaptive equipment Adaptive Equipment: Reacher        Prior Functioning/Environment Level of Independence: Independent with assistive device(s)  Gait / Transfers Assistance Needed: used cane when back pain increased              OT Problem List: Decreased activity tolerance;Decreased knowledge of use of DME or AE;Pain;Impaired balance (sitting and/or standing);Decreased knowledge of precautions   OT Treatment/Interventions: Self-care/ADL training;DME and/or AE instruction;Therapeutic activities;Patient/family education    OT Goals(Current goals can be found in the care plan section) Acute Rehab OT Goals Patient Stated Goal: get better and go home OT Goal Formulation: With patient Time For Goal Achievement: 07/18/16 Potential to Achieve Goals: Good ADL Goals Pt Will Perform Grooming: with min guard assist;standing;with caregiver independent in assisting Pt Will Perform Upper Body Bathing: with supervision;with set-up;sitting;with caregiver independent in assisting Pt Will Perform Lower Body Bathing: with mod assist;with adaptive equipment;with caregiver independent in assisting Pt Will Perform Upper Body Dressing: with set-up;with supervision;sitting;with caregiver independent in assisting Pt Will Perform Lower Body Dressing: with max assist;with mod assist;with caregiver independent in assisting;with adaptive equipment Pt Will Transfer to Toilet: with min assist;bedside commode;ambulating Pt Will Perform Toileting - Clothing Manipulation and hygiene: with max assist;with mod assist;with caregiver independent in assisting Pt Will Perform Tub/Shower Transfer: 3 in 1;shower seat;ambulating;with caregiver independent in assisting;with min assist;rolling walker;grab bars  OT Frequency: Min 2X/week    Barriers to D/C:    pt will have assist from his wife x1 week after d/c home       Co-evaluation PT/OT/SLP Co-Evaluation/Treatment: Yes Reason for Co-Treatment: Complexity of the patient's impairments (multi-system involvement);For patient/therapist safety PT goals addressed during session: Mobility/safety with mobility;Balance;Proper use of DME OT goals addressed during session: ADL's and self-care      End of Session Equipment Utilized During Treatment: Gait belt;Rolling walker;Back brace Nurse Communication: Mobility status  Activity Tolerance: Patient limited by pain;Other (comment) (dizziness, nausea) Patient left: in bed;with call bell/phone within reach;with nursing/sitter in room   Time: 0017-4944 OT Time Calculation (min): 40 min Charges:  OT General Charges $OT Visit: 1 Procedure OT Evaluation $OT Eval Moderate Complexity: 1 Procedure OT Treatments $Therapeutic Activity: 8-22 mins G-Codes:    Britt Bottom 07/11/2016, 4:03 PM

## 2016-07-11 NOTE — Progress Notes (Signed)
Inpatient Diabetes Program Recommendations  AACE/ADA: New Consensus Statement on Inpatient Glycemic Control (2015)  Target Ranges:  Prepandial:   less than 140 mg/dL      Peak postprandial:   less than 180 mg/dL (1-2 hours)      Critically ill patients:  140 - 180 mg/dL   Lab Results  Component Value Date   GLUCAP 194 (H) 07/11/2016   HGBA1C 9.3 (H) 07/05/2016    Review of Glycemic Control  Diabetes history: DM 2 Outpatient Diabetes medications: Lantus 50 units Current orders for Inpatient glycemic control: Novolog Resistant + HS scale  Inpatient Diabetes Program Recommendations:   Patient takes Lantus 50 units at home. Glucose above inpatient goal up into the 200's. Please consider starting a portion of patient's basal insulin, Lantus 25 units Q24hours.  Thanks,  Tama Headings RN, MSN, [facility] Inpatient Diabetes Coordinator Team Pager [phone] (8a-5p)

## 2016-07-11 NOTE — Progress Notes (Signed)
Patients SBP was in the low 80's appears more comfortable denies any discomfort uses PCA a lot. Dr. Cyndy Freeze was notified with orders made. NS 1 liter bolus IV given will continue to monitor patient.

## 2016-07-11 NOTE — Op Note (Signed)
NAMEDAVIDJAMES, BLANSETT NO.:  [id number]  MEDICAL RECORD NO.:  [id number]  LOCATION:  5C10C                        FACILITY:  [facility]  PHYSICIAN:  Leeroy Cha, M.D.   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  PROCEDURES:  Bilateral L3, L4, L5 laminectomy, partial L2, decompression of the thecal sac, foraminotomy to decompress the L2, L3, L4, L5 nerve root.  Then facetectomy of L5, decompression of the L5-S1 nerve root. Insertion of four pedicle screws.  Two of them 5.5 x 45 and two of them 5.5 x 55.  Posterolateral arthrodesis, L5-S1 with autograft and BMP. Cell Saver.  C-arm.  SURGEON:  Leeroy Cha, M.D.  ASSISTANT:  Dr. Cyndy Freeze.  CLINICAL HISTORY:  Mr. Esguerra is a gentleman who had been complaining of back pain radiation to both legs associated with weakness.  He has failed conservative treatment.  He has an x-ray.  Then, MRI showed that he has stenosis.  He has a lumbar myelogram, which showed that he has spondylolisthesis at the level of L5-S1 with facet arthropathy and stenosis from L2 down to L4, L5-S1.  The patient wants to proceed with surgery and he knew the risk with surgery including the possibility of infection, no improvement whatsoever, CSF leak, hematoma.  DESCRIPTION OF PROCEDURE:  The patient was taken to the OR and after intubation, he was positioned in a prone manner.  The back was cleaned with DuraPrep and drapes were applied.  X-ray initially showed that we were right at the level of L2-L3.  Then, a midline incision from L2 down to L5-S1 was made and muscles were retracted all the way laterally.  We started with removal of the spinous process of L3 all the way down to  L4- L5.  Laminectomy was accomplished.  At the level of L2 with partial laminectomy to decompress the L2-L3 space and from then on after we did the laminectomy, we decompressed the foramina for the L5, L3, L4, L5 nerve root.  At the level of L5-S1, we found quite a bit of hypertrophic facet with quite a bit of compromise of the thecal sac as well as the foramina, right worse than the left one.  The patient had quite a bit of calcified disk.  We tried to enter into the disk space at the level of L5-1, but it was quite impossible.  We proceeded with removal of facet of L5 and we were able to decompress both the L5 and S1 nerve root, the L5 more compromised, the right worse than the left one.  At the end, using the C-arm first in AP view and then a lateral view, we made four holes in the pedicle of L5-S1.  We were able to introduce two screws of 5.5 x 45 at the level of L5 and two of them of 5.5 x 55 at the level of S1.  Although this gentleman is quite large, nevertheless, the bones mostly  at the level of the facet of L5 were quite fibrotic.  Prior to introduction of the screws, we feel all four quadrants just to be sure that we were surrounded by bone.  The screws were hold in place using two rods and Capps.  From then on, we went laterally and we removed the periosteum of L5 and the ala of S1.  Then, a mix of BMP and autograft were used for fusion.  The area was irrigated.  Valsalva maneuver was negative.  From then on, vancomycin powder was left in the operative site and the wound was closed with different layer of Vicryl and staples.  Hemovac drain was left also in the operative site.          ______________________________ Leeroy Cha, M.D.     EB/MEDQ  D:  07/10/2016  T:  07/11/2016  Job:  [id number]

## 2016-07-12 LAB — CBC
HCT: 30 % — ABNORMAL LOW (ref 39.0–52.0)
Hemoglobin: 9.9 g/dL — ABNORMAL LOW (ref 13.0–17.0)
MCH: 27.9 pg (ref 26.0–34.0)
MCHC: 33 g/dL (ref 30.0–36.0)
MCV: 84.5 fL (ref 78.0–100.0)
PLATELETS: 86 10*3/uL — AB (ref 150–400)
RBC: 3.55 MIL/uL — ABNORMAL LOW (ref 4.22–5.81)
RDW: 13.7 % (ref 11.5–15.5)
WBC: 7.4 10*3/uL (ref 4.0–10.5)

## 2016-07-12 LAB — GLUCOSE, CAPILLARY
GLUCOSE-CAPILLARY: 155 mg/dL — AB (ref 65–99)
Glucose-Capillary: 218 mg/dL — ABNORMAL HIGH (ref 65–99)
Glucose-Capillary: 167 mg/dL — ABNORMAL HIGH (ref 65–99)
Glucose-Capillary: 162 mg/dL — ABNORMAL HIGH (ref 65–99)

## 2016-07-12 LAB — CREATININE, SERUM
Creatinine, Ser: 0.9 mg/dL (ref 0.61–1.24)
GFR calc Af Amer: >60 mL/min (ref >60)
GFR calc non Af Amer: >60 mL/min (ref >60)

## 2016-07-12 MED ORDER — HEPARIN SODIUM (PORCINE) 5000 UNIT/ML IJ SOLN
5000.0000 [IU] | Freq: Three times a day (TID) | INTRAMUSCULAR | Status: DC
  Administered 2016-07-12 – 2016-07-14 (×6): 5000 [IU] via SUBCUTANEOUS
  Filled 2016-07-12 (×7): qty 1

## 2016-07-12 NOTE — Progress Notes (Signed)
Patient ID: Billy Davis, male   DOB: 04-13-1956, 60 y.o.   MRN: [id number] Still no quite oob. No weakness. Less drainage. Not using the morphine. To get heparin to  Prevent dvt

## 2016-07-12 NOTE — Progress Notes (Signed)
Physical Therapy Treatment Patient Details Name: Billy Davis MRN: [id number] DOB: 09-21-1955 Today's Date: 07/12/2016    History of Present Illness pt presents post L2-5 Foraminectomy, L3-5 Lami, and L5-S1 Fusion.  pt with hx of Prostate CA, DM, HTN, and hearing loss.      PT Comments    Pt progressing well with mobility, he ambulated 72' with RW, no LOB. He's requiring less assistance for bed mobility and transfers. Reviewed back precautions.   Follow Up Recommendations  Home health PT;Supervision/Assistance - 24 hour     Equipment Recommendations  Rolling walker with 5" wheels;3in1 (PT)    Recommendations for Other Services       Precautions / Restrictions Precautions Precautions: Back Precaution Booklet Issued: Yes (comment) Precaution Comments: Reviewed back precautions. Required Braces or Orthoses: Spinal Brace Spinal Brace: Lumbar corset;Applied in sitting position Restrictions Weight Bearing Restrictions: No    Mobility  Bed Mobility Overal bed mobility: Needs Assistance Bed Mobility: Sidelying to Sit Rolling: Supervision Sidelying to sit: Supervision     Sit to sidelying: Min assist General bed mobility comments: no physical assist for sidelying to sit, min A for BLEs into bed  Transfers Overall transfer level: Needs assistance Equipment used: Rolling walker (2 wheeled) Transfers: Sit to/from Stand Sit to Stand: Min assist;From elevated surface         General transfer comment: VCs hand placement and for precautions, min A to rise from elevated bed  Ambulation/Gait Ambulation/Gait assistance: Min guard Ambulation Distance (Feet): 70 Feet Assistive device: Rolling walker (2 wheeled) Gait Pattern/deviations: Step-through pattern;Decreased step length - right;Decreased step length - left     General Gait Details: heavy use of BUEs on RW, pt reports he feels "weak as dishwater", no LOB, SaO2 96% on RA walking   Stairs             Wheelchair Mobility    Modified Rankin (Stroke Patients Only)       Balance     Sitting balance-Leahy Scale: Fair     Standing balance support: Bilateral upper extremity supported Standing balance-Leahy Scale: Poor                      Cognition Arousal/Alertness: Awake/alert Behavior During Therapy: WFL for tasks assessed/performed Overall Cognitive Status: Within Functional Limits for tasks assessed                      Exercises      General Comments        Pertinent Vitals/Pain Pain Score: 2  Pain Location: back Pain Descriptors / Indicators: Sore Pain Intervention(s): Limited activity within patient's tolerance;Monitored during session;Premedicated before session    Home Living                      Prior Function            PT Goals (current goals can now be found in the care plan section) Acute Rehab PT Goals Patient Stated Goal: likes to do wordworking PT Goal Formulation: With patient Time For Goal Achievement: 07/18/16 Potential to Achieve Goals: Good Progress towards PT goals: Progressing toward goals    Frequency    Min 5X/week      PT Plan Current plan remains appropriate    Co-evaluation PT/OT/SLP Co-Evaluation/Treatment: Yes           End of Session Equipment Utilized During Treatment: Gait belt;Back brace Activity Tolerance: Patient tolerated treatment well (Low BP  and Pain) Patient left: in bed;with call bell/phone within reach     Time: 1047-1110 PT Time Calculation (min) (ACUTE ONLY): 23 min  Charges:  $Gait Training: 8-22 mins $Therapeutic Activity: 8-22 mins                    G Codes:      Philomena Doheny 07/12/2016, 11:19 AM [phone]

## 2016-07-12 NOTE — Progress Notes (Signed)
Inpatient Diabetes Program Recommendations  AACE/ADA: New Consensus Statement on Inpatient Glycemic Control (2015)  Target Ranges:  Prepandial:   less than 140 mg/dL      Peak postprandial:   less than 180 mg/dL (1-2 hours)      Critically ill patients:  140 - 180 mg/dL   Lab Results  Component Value Date   GLUCAP 218 (H) 07/12/2016   HGBA1C 9.3 (H) 07/05/2016   Diabetes history: DM 2 Outpatient Diabetes medications: Lantus 50 units daily Current orders for Inpatient glycemic control: Novolog Resistant tid with meals and HS   Inpatient Diabetes Program Recommendations:   Patient takes Lantus 50 units at home. Glucose above inpatient goal up into the 200's. Please consider starting a portion of patient's basal insulin, Lantus 25 units daily.  Thanks, Adah Perl, RN, BC-ADM Inpatient Diabetes Coordinator Pager [phone] (8a-5p)

## 2016-07-12 NOTE — Care Management Note (Signed)
Case Management Note  Patient Details  Name: Billy Davis MRN: [id number] Date of Birth: 09/07/1955  Subjective/Objective:   Pt underwent:  LUMBAR TWO-LUMBAR FIVE LAMINECTOMY WITH LUMBAR FIVE-SACRAL ONE FUSION. He is from home with his spouse.                 Action/Plan: PT/OT recommending [facility] services. CM following for discharge needs.   Expected Discharge Date:                  Expected Discharge Plan:  [name]  In-House Referral:     Discharge planning Services     Post Acute Care Choice:    Choice offered to:     DME Arranged:    DME Agency:     HH Arranged:    [place] Agency:     Status of Service:  In process, will continue to follow  If discussed at Long Length of Stay Meetings, dates discussed:    Additional Comments:  Pollie Friar, RN 07/12/2016, 12:06 PM

## 2016-07-13 ENCOUNTER — Encounter (HOSPITAL_COMMUNITY): Payer: Self-pay

## 2016-07-13 LAB — GLUCOSE, CAPILLARY
GLUCOSE-CAPILLARY: 173 mg/dL — AB (ref 65–99)
GLUCOSE-CAPILLARY: 153 mg/dL — AB (ref 65–99)
Glucose-Capillary: 158 mg/dL — ABNORMAL HIGH (ref 65–99)
Glucose-Capillary: 156 mg/dL — ABNORMAL HIGH (ref 65–99)
Glucose-Capillary: 159 mg/dL — ABNORMAL HIGH (ref 65–99)

## 2016-07-13 MED ORDER — BISACODYL 10 MG RE SUPP: 10.0000 mg | Freq: Every day | RECTAL | Status: DC | PRN

## 2016-07-13 NOTE — Progress Notes (Signed)
Occupational Therapy Treatment Patient Details Name: Billy Davis MRN: [id number] DOB: 01-19-1956 Today's Date: 07/13/2016    History of present illness pt presents post L2-5 Foraminectomy, L3-5 Lami, and L5-S1 Fusion.  pt with hx of Prostate CA, DM, HTN, and hearing loss.     OT comments  Pt making good progress with adls. Pt was introduced to adaptive equipment to increase independence in LE adls today and ambulated to bathroom with walker and S to toilet with min assist. Pt to purchase AE at gift store before d/c.  Follow Up Recommendations  Home health OT    Equipment Recommendations  3 in 1 bedside comode;Tub/shower seat;Other (comment)    Recommendations for Other Services      Precautions / Restrictions Precautions Precautions: Back Precaution Booklet Issued: Yes (comment) Precaution Comments: Reviewed back precautions. Required Braces or Orthoses: Spinal Brace Spinal Brace: Lumbar corset;Applied in sitting position Restrictions Weight Bearing Restrictions: No       Mobility Bed Mobility Overal bed mobility: Modified Independent             General bed mobility comments: able to return to bed without difficulty  Transfers Overall transfer level: Needs assistance Equipment used: Rolling walker (2 wheeled) Transfers: Sit to/from Stand Sit to Stand: Supervision;From elevated surface         General transfer comment: safe without cues    Balance Overall balance assessment: Needs assistance Sitting-balance support: Feet supported Sitting balance-Leahy Scale: Good     Standing balance support: Bilateral upper extremity supported;During functional activity Standing balance-Leahy Scale: Poor Standing balance comment: Pt must have walker when on his feet.                    ADL Overall ADL's : Needs assistance/impaired Eating/Feeding: Independent;Sitting   Grooming: Wash/dry hands;Wash/dry face;Supervision/safety;Standing   Upper Body  Bathing: Set up;Sitting   Lower Body Bathing: Moderate assistance;Sit to/from stand Lower Body Bathing Details (indicate cue type and reason): mod assist only to reach below knees.  Pt to purchase AE at gift store for long scurb brush. Upper Body Dressing : Supervision/safety;Sitting Upper Body Dressing Details (indicate cue type and reason): donned brace without assist. Lower Body Dressing: Minimal assistance;Sit to/from stand;Cueing for back precautions;With adaptive equipment Lower Body Dressing Details (indicate cue type and reason): pt instructed in use of reacher and sock aid. pt to purchase in gift store. Toilet Transfer: Minimal assistance;RW;Comfort height toilet;Grab bars Toilet Transfer Details (indicate cue type and reason): pt walked to bathroom with walker and min guard. Toileting- Clothing Manipulation and Hygiene: Minimal assistance;Sit to/from stand Toileting - Clothing Manipulation Details (indicate cue type and reason): min assist  to make sure pt was clean     Functional mobility during ADLs: Min guard;Rolling walker General ADL Comments: Pt doing much better with adls and AE made LE dressing much easier.      Vision                     Perception     Praxis      Cognition   Behavior During Therapy: [facility] for tasks assessed/performed Overall Cognitive Status: Within Functional Limits for tasks assessed                       Extremity/Trunk Assessment               Exercises     Shoulder Instructions       General Comments  Pertinent Vitals/ Pain       Pain Assessment: 0-10 Pain Score: 5  Pain Location: back Pain Descriptors / Indicators: Aching;Operative site guarding Pain Intervention(s): Monitored during session;Repositioned  Home Living                                          Prior Functioning/Environment              Frequency  Min 2X/week        Progress Toward Goals  OT  Goals(current goals can now be found in the care plan section)  Progress towards OT goals: Progressing toward goals  Acute Rehab OT Goals Patient Stated Goal: likes to do wordworking OT Goal Formulation: With patient Time For Goal Achievement: 07/18/16 Potential to Achieve Goals: Good ADL Goals Pt Will Perform Grooming: with min guard assist;standing;with caregiver independent in assisting Pt Will Perform Upper Body Bathing: with supervision;with set-up;sitting;with caregiver independent in assisting Pt Will Perform Lower Body Bathing: with mod assist;with adaptive equipment;with caregiver independent in assisting Pt Will Perform Upper Body Dressing: with set-up;with supervision;sitting;with caregiver independent in assisting Pt Will Perform Lower Body Dressing: with max assist;with mod assist;with caregiver independent in assisting;with adaptive equipment Pt Will Transfer to Toilet: with min assist;bedside commode;ambulating Pt Will Perform Toileting - Clothing Manipulation and hygiene: with max assist;with mod assist;with caregiver independent in assisting Pt Will Perform Tub/Shower Transfer: 3 in 1;shower seat;ambulating;with caregiver independent in assisting;with min assist;rolling walker;grab bars  Plan Discharge plan remains appropriate    Co-evaluation                 End of Session Equipment Utilized During Treatment: Gait belt;Rolling walker;Back brace   Activity Tolerance Patient tolerated treatment well   Patient Left in bed;with call bell/phone within reach   Nurse Communication Mobility status        Time: 1210-1228 OT Time Calculation (min): 18 min  Charges: OT General Charges $OT Visit: 1 Procedure OT Treatments $Self Care/Home Management : 8-22 mins  Glenford Peers 07/13/2016, 12:40 PM  [phone]

## 2016-07-13 NOTE — Progress Notes (Signed)
Physical Therapy Treatment Patient Details Name: Billy Davis MRN: [id number] DOB: 09-15-1955 Today's Date: 07/13/2016    History of Present Illness pt presents post L2-5 Foraminectomy, L3-5 Lami, and L5-S1 Fusion.  pt with hx of Prostate CA, DM, HTN, and hearing loss.      PT Comments    Patient progressing well with mobility, ambulated increased distance, less reliance on RW. Able to perform stair negotiation with min assist. Discussed technique for car transfers and mobility expectations for home management.   Follow Up Recommendations  Home health PT;Supervision/Assistance - 24 hour     Equipment Recommendations  Rolling walker with 5" wheels;3in1 (PT)    Recommendations for Other Services       Precautions / Restrictions Precautions Precautions: Back Precaution Booklet Issued: Yes (comment) Precaution Comments: Reviewed back precautions. Required Braces or Orthoses: Spinal Brace Spinal Brace: Lumbar corset;Applied in sitting position Restrictions Weight Bearing Restrictions: No    Mobility  Bed Mobility Overal bed mobility: Modified Independent             General bed mobility comments: able to return to bed without difficulty  Transfers Overall transfer level: Needs assistance Equipment used: Rolling walker (2 wheeled) Transfers: Sit to/from Stand Sit to Stand: Supervision;From elevated surface         General transfer comment: Supervision for u  Ambulation/Gait Ambulation/Gait assistance: Supervision Ambulation Distance (Feet): 120 Feet Assistive device: Rolling walker (2 wheeled) Gait Pattern/deviations: Step-through pattern;Decreased stride length;Trunk flexed Gait velocity: decreased Gait velocity interpretation: Below normal speed for age/gender General Gait Details: VCs for upright posture and positioning with use of RW. Patient with slow guarded gait, cues for increased cadence   Stairs Stairs: Yes Stairs assistance: Min assist Stair  Management: One rail Right;Step to pattern Number of Stairs: 4 General stair comments: VCs for technique and sequencing  Wheelchair Mobility    Modified Rankin (Stroke Patients Only)       Balance     Sitting balance-Leahy Scale: Fair       Standing balance-Leahy Scale: Poor                      Cognition Arousal/Alertness: Awake/alert Behavior During Therapy: WFL for tasks assessed/performed Overall Cognitive Status: Within Functional Limits for tasks assessed                      Exercises      General Comments        Pertinent Vitals/Pain Pain Assessment: 0-10 Pain Score: 1  Pain Location: back Pain Descriptors / Indicators: Sore Pain Intervention(s): Monitored during session    Home Living                      Prior Function            PT Goals (current goals can now be found in the care plan section) Acute Rehab PT Goals Patient Stated Goal: likes to do wordworking PT Goal Formulation: With patient Time For Goal Achievement: 07/18/16 Potential to Achieve Goals: Good Progress towards PT goals: Progressing toward goals    Frequency    Min 5X/week      PT Plan Current plan remains appropriate    Co-evaluation             End of Session Equipment Utilized During Treatment: Gait belt;Back brace Activity Tolerance: Patient tolerated treatment well (Low BP and Pain) Patient left: in bed;with call bell/phone within reach  Time: 8841-6606 PT Time Calculation (min) (ACUTE ONLY): 20 min  Charges:  $Gait Training: 8-22 mins                    G Codes:      Duncan Dull 07/14/16, 9:46 AM Alben Deeds, PT DPT  [phone]

## 2016-07-13 NOTE — Progress Notes (Signed)
Patient ID: Billy Davis, male   DOB: [birth year], 59 y.o.   MRN: [id number] Better ,oob. Less incisional pain. See orders

## 2016-07-14 ENCOUNTER — Telehealth: Payer: Self-pay | Admitting: *Deleted

## 2016-07-14 LAB — GLUCOSE, CAPILLARY
Glucose-Capillary: 177 mg/dL — ABNORMAL HIGH (ref 65–99)
Glucose-Capillary: 167 mg/dL — ABNORMAL HIGH (ref 65–99)

## 2016-07-14 MED ORDER — CYCLOBENZAPRINE HCL 10 MG PO TABS: 10.0000 mg | ORAL_TABLET | Freq: Three times a day (TID) | ORAL | 1 refills | Status: DC | PRN

## 2016-07-14 MED ORDER — OXYCODONE-ACETAMINOPHEN 10-325 MG PO TABS: 1.0000 | ORAL_TABLET | ORAL | 0 refills | Status: DC | PRN

## 2016-07-14 MED ORDER — DOCUSATE SODIUM 100 MG PO CAPS: 100.0000 mg | ORAL_CAPSULE | Freq: Two times a day (BID) | ORAL | 0 refills | Status: DC

## 2016-07-14 NOTE — Discharge Summary (Signed)
Physician Discharge Summary  Patient ID: Billy Davis MRN: [id number] DOB/AGE: Apr 03, 1956 60 y.o.  Admit date: 07/10/2016 Discharge date: 07/14/2016  Admission Diagnoses:L2-3, L3-4, L4-5 spinal stenosis, L5-S1 spondylolisthesis, lumbago, lumbar radiculopathy  Discharge Diagnoses: The same Active Problems:   Lumbar adjacent segment disease with spondylolisthesis   Discharged Condition: good  Hospital Course: Dr. Joya Salm performed in L2-3, L3-4 and L4-5 laminectomy with an L5-S1 instrumentation and fusion on the patient on 07/10/2016.  On postoperative day #4 the patient requested discharge home. He was given written and oral discharge instructions. Arrangements were made for home physical therapy and occupational therapy. All his questions were answered.  Consults: Physical therapy Significant Diagnostic Studies: None Treatments: L2-3, L3-4 and L4-5 laminectomy with L5-S1 instrumentation and fusion. Discharge Exam: Blood pressure (!) 119/54, pulse 77, temperature 98.6 F (37 C), temperature source Oral, resp. rate 20, height 6' 2"  (1.88 m), weight (!) 146.5 kg (323 lb), SpO2 98 %. The patient is alert and pleasant. His dressing is clean and dry. He is moving his lower extremities well.  Disposition: Home     Medication List    STOP taking these medications   diazepam 2 MG tablet Commonly known as:  VALIUM   ibuprofen 200 MG tablet Commonly known as:  ADVIL,MOTRIN     TAKE these medications   amLODipine 5 MG tablet Commonly known as:  NORVASC Take 5 mg by mouth daily.   aspirin EC 81 MG tablet Take 81 mg by mouth daily.   chlorthalidone 25 MG tablet Commonly known as:  HYGROTON TAKE 1 TABLET DAILY   cyclobenzaprine 10 MG tablet Commonly known as:  FLEXERIL Take 1 tablet (10 mg total) by mouth 3 (three) times daily as needed for muscle spasms.   dicyclomine 10 MG capsule Commonly known as:  BENTYL TAKE 1 CAPSULE DAILY   docusate sodium 100 MG  capsule Commonly known as:  COLACE Take 1 capsule (100 mg total) by mouth 2 (two) times daily.   LANTUS SOLOSTAR 100 UNIT/ML Solostar Pen Generic drug:  Insulin Glargine Inject 50 Units into the skin daily.   lisinopril 40 MG tablet Commonly known as:  PRINIVIL,ZESTRIL Take 40 mg by mouth every morning.   metoprolol 50 MG tablet Commonly known as:  LOPRESSOR Take 50 mg by mouth daily.   MULTI VITAMIN MENS PO Take 1 tablet by mouth daily.   NITROSTAT 0.4 MG SL tablet Generic drug:  nitroGLYCERIN Place 1 tablet under the tongue every 5 (five) minutes as needed. For chest pains   oxyCODONE-acetaminophen 10-325 MG tablet Commonly known as:  PERCOCET Take 1 tablet by mouth every 4 (four) hours as needed for pain.   pantoprazole 40 MG tablet Commonly known as:  PROTONIX Take 40 mg by mouth daily.        SignedNewman Pies D 07/14/2016, 8:16 AM

## 2016-07-14 NOTE — Telephone Encounter (Signed)
Pt confirmed he has contact number for [facility] and expressed appreciation.  No other CM needs were communicated.

## 2016-07-14 NOTE — Progress Notes (Signed)
Patient is ready for discharge to home; discharge instructions given and reviewed;Rx given; patient has received the rolling walker and 3 in 1 commode; patient discharged out via wheelchair accompanied home by his wife.

## 2016-07-14 NOTE — Care Management Note (Addendum)
Case Management Note  Patient Details  Name: Billy Davis MRN: [id number] Date of Birth: [birth year]  Subjective/Objective:                  :L2-3, L3-4, L4-5 spinal stenosis, L5-S1 spondylolisthesis, lumbago, lumbar  Action/Plan: Discharge planning Expected Discharge Date:  07/14/16              Expected Discharge Plan:  [place]  In-House Referral:     Discharge planning Services  CM Consult  Post Acute Care Choice:  Home Health Choice offered to:  Patient  DME Arranged:  3-N-1, Walker rolling DME Agency:  [place]:  PT [facility] Agency:  Other - See comment  Status of Service:  Completed, signed off  If discussed at [place] of Stay Meetings, dates discussed:    Additional Comments: CM met with pt in room and explained [place] insurance recipients will be contacted by [facility] for his home health agency once they have arranged.  Cm has placed [facility] contact information on AVS.  CM notified [facility] DME rep, Reggie to please delive rthe 3n1 and rolling walker to room prior to discharge.  CM called [facility] [phone] and spoke with Areetha who requested I fax referral to [phone]; CM faxed and received confirmation of receipt.  No other CM needs were communicated. Dellie Catholic, RN 07/14/2016, 11:14 AM

## 2016-07-14 NOTE — Progress Notes (Signed)
CM received notification from [place] pt's insurance is expired 07/15/16 and will need pt to call in new insurance information. CM called pt who verbalized understanding he is to call [facility] (Contact information on AVS) to update insurance and [facility] will then arrange for [facility] services.  Pt expressed appreciation. No other CM needs were communicated.

## 2016-07-14 NOTE — Progress Notes (Signed)
PT Cancellation Note  Patient Details Name: JAIVYN GULLA MRN: [id number] DOB: Aug 24, 1956   Cancelled Treatment:    Reason Eval/Treat Not Completed: Pain limiting ability to participate.  Attempted to see patient at 10:05.  Patient declined due to significant pain and awaiting pain meds.  Returned at 12:18 and patient had been discharged.   Despina Pole 07/14/2016, 12:21 PM Carita Pian Sanjuana Kava, [place] Pager [phone]

## 2016-08-04 ENCOUNTER — Other Ambulatory Visit (INDEPENDENT_AMBULATORY_CARE_PROVIDER_SITE_OTHER): Payer: Self-pay | Admitting: Internal Medicine

## 2016-11-08 ENCOUNTER — Ambulatory Visit (INDEPENDENT_AMBULATORY_CARE_PROVIDER_SITE_OTHER): Payer: Managed Care, Other (non HMO) | Admitting: Internal Medicine

## 2016-11-08 ENCOUNTER — Encounter (INDEPENDENT_AMBULATORY_CARE_PROVIDER_SITE_OTHER): Payer: Self-pay | Admitting: *Deleted

## 2016-11-08 ENCOUNTER — Encounter (INDEPENDENT_AMBULATORY_CARE_PROVIDER_SITE_OTHER): Payer: Self-pay | Admitting: Internal Medicine

## 2016-11-08 ENCOUNTER — Encounter (INDEPENDENT_AMBULATORY_CARE_PROVIDER_SITE_OTHER): Payer: Self-pay

## 2016-11-08 VITALS — BP 140/86 | HR 65 | Temp 97.5°F | Ht 74.0 in | Wt 318.5 lb

## 2016-11-08 DIAGNOSIS — K760 Fatty (change of) liver, not elsewhere classified: Secondary | ICD-10-CM | POA: Diagnosis not present

## 2016-11-08 DIAGNOSIS — K219 Gastro-esophageal reflux disease without esophagitis: Secondary | ICD-10-CM | POA: Diagnosis not present

## 2016-11-08 MED ORDER — DEXLANSOPRAZOLE 60 MG PO CPDR: 60.0000 mg | DELAYED_RELEASE_CAPSULE | Freq: Every day | ORAL | 3 refills | Status: DC

## 2016-11-08 NOTE — Patient Instructions (Signed)
Labs today. OV in 1 year.

## 2016-11-08 NOTE — Addendum Note (Signed)
Addended by: Butch Penny on: 11/08/2016 09:21 AM   Modules accepted: Orders

## 2016-11-08 NOTE — Progress Notes (Addendum)
   Subjective:    Patient ID: Billy Davis, male    DOB: 01/26/1956, 61 y.o.   MRN: [id number] Wt in May of 2016 331. HPI Here today for f/u. He wqas last seen in May of 2016. Hx of cirrhosis secondary to NAFLD. Also has hx of GERD. EGD in June of 2012 was negative for esophageal varicies.  Requesting to change his Protonix to another PPI. Cardiac stents x 2. Diabetic x 2.  States he sometimes has pain in his rt posterior ribs. His appetite is good.  He has lost from 331 to 318. States he had back surgery in November by Dr. Joya Salm. He has a BM x 2-3 a day. No melena or BRRB.  02/21/2011 EGD with IZ:TIWPYK esophagus without ring or stricture formatio. No evidence of esophageal and/or gastric varices. Two small hyperplastic appearing polyps at proximal gastric body which were left alone. Few antral vascular telangiectasia and focal gastritis at antrum.  02/14/2015 US abdomen: cirrhosis Liver is echogenic consistent fataty infiltration and/or hepatocellular disease. Cirrhosis cannot be excluded.  Hepatosplenmegaly.   Hepatic Function Latest Ref Rng & Units 07/05/2016 11/09/2013 03/31/2013  Total Protein 6.5 - 8.1 g/dL 7.3 6.9 7.0  Albumin 3.5 - 5.0 g/dL 4.3 4.0 4.0  AST 15 - 41 U/L 36 28 46(H)  ALT 17 - 63 U/L 39 32 40  Alk Phosphatase 38 - 126 U/L 54 60 68  Total Bilirubin 0.3 - 1.2 mg/dL 1.3(H) 0.7 0.7  Bilirubin, Direct 0.1 - 0.5 mg/dL 0.2 - -      Review of Systems         Objective:   Physical Exam Blood pressure 140/86, pulse 65, temperature 97.5 F (36.4 C), height 6' 2"  (1.88 m), weight (!) 318 lb 8 oz (144.5 kg).  Alert and oriented. Skin warm and dry. Oral mucosa is moist.   . Sclera anicteric, conjunctivae is pink. Thyroid not enlarged. No cervical lymphadenopathy. Lungs clear. Heart regular rate and rhythm.  Abdomen is soft. Bowel sounds are positive. No hepatomegaly. No abdominal masses felt. No tenderness.  No edema to lower extremities.         Assessment & Plan:    NAFLD with normal liver enzymes.     AFP. Will get labs from his PCP US abdomen OV in 1 year.

## 2016-11-08 NOTE — Addendum Note (Signed)
Addended by: Butch Penny on: 11/08/2016 10:09 AM   Modules accepted: Orders

## 2016-11-12 LAB — AFP TUMOR MARKER: AFP-Tumor Marker: 2.4 ng/mL (ref <6.1)

## 2016-11-26 ENCOUNTER — Telehealth (INDEPENDENT_AMBULATORY_CARE_PROVIDER_SITE_OTHER): Payer: Self-pay | Admitting: Internal Medicine

## 2016-11-26 ENCOUNTER — Ambulatory Visit (HOSPITAL_COMMUNITY)
Admission: RE | Admit: 2016-11-26 | Discharge: 2016-11-26 | Disposition: A | Discharge status: Home / Self Care | Payer: Managed Care, Other (non HMO) | Source: Ambulatory Visit | Attending: Internal Medicine | Admitting: Internal Medicine

## 2016-11-26 DIAGNOSIS — R161 Splenomegaly, not elsewhere classified: Secondary | ICD-10-CM | POA: Diagnosis not present

## 2016-11-26 DIAGNOSIS — N2889 Other specified disorders of kidney and ureter: Secondary | ICD-10-CM | POA: Diagnosis not present

## 2016-11-26 DIAGNOSIS — K760 Fatty (change of) liver, not elsewhere classified: Secondary | ICD-10-CM | POA: Insufficient documentation

## 2016-11-26 NOTE — Telephone Encounter (Signed)
Referral to Dr. Alinda Money at patient's request

## 2016-12-04 ENCOUNTER — Other Ambulatory Visit (HOSPITAL_COMMUNITY): Payer: Self-pay | Admitting: Urology

## 2016-12-04 DIAGNOSIS — D49511 Neoplasm of unspecified behavior of right kidney: Secondary | ICD-10-CM

## 2016-12-13 ENCOUNTER — Ambulatory Visit (HOSPITAL_COMMUNITY)
Admission: RE | Admit: 2016-12-13 | Discharge: 2016-12-13 | Disposition: A | Discharge status: Home / Self Care | Payer: Managed Care, Other (non HMO) | Source: Ambulatory Visit | Attending: Urology | Admitting: Urology

## 2016-12-13 ENCOUNTER — Encounter (HOSPITAL_COMMUNITY): Payer: Self-pay

## 2016-12-13 ENCOUNTER — Other Ambulatory Visit: Payer: Self-pay | Admitting: Urology

## 2016-12-13 DIAGNOSIS — D49511 Neoplasm of unspecified behavior of right kidney: Secondary | ICD-10-CM

## 2016-12-14 ENCOUNTER — Other Ambulatory Visit: Payer: Self-pay | Admitting: Neurological Surgery

## 2016-12-14 DIAGNOSIS — M4316 Spondylolisthesis, lumbar region: Secondary | ICD-10-CM

## 2016-12-24 ENCOUNTER — Ambulatory Visit
Admission: RE | Admit: 2016-12-24 | Discharge: 2016-12-24 | Disposition: A | Discharge status: Home / Self Care | Payer: Managed Care, Other (non HMO) | Source: Ambulatory Visit | Attending: Neurological Surgery | Admitting: Neurological Surgery

## 2016-12-24 ENCOUNTER — Ambulatory Visit
Admission: RE | Admit: 2016-12-24 | Discharge: 2016-12-24 | Disposition: A | Discharge status: Home / Self Care | Payer: Managed Care, Other (non HMO) | Source: Ambulatory Visit | Attending: Urology | Admitting: Urology

## 2016-12-24 DIAGNOSIS — M4316 Spondylolisthesis, lumbar region: Secondary | ICD-10-CM

## 2016-12-24 DIAGNOSIS — D49511 Neoplasm of unspecified behavior of right kidney: Secondary | ICD-10-CM

## 2016-12-24 MED ORDER — GADOBENATE DIMEGLUMINE 529 MG/ML IV SOLN
20.0000 mL | Freq: Once | INTRAVENOUS | Status: AC | PRN
  Administered 2016-12-24: 20 mL via INTRAVENOUS

## 2016-12-26 ENCOUNTER — Other Ambulatory Visit: Payer: Managed Care, Other (non HMO)

## 2016-12-28 ENCOUNTER — Ambulatory Visit
Admission: RE | Admit: 2016-12-28 | Discharge: 2016-12-28 | Disposition: A | Discharge status: Home / Self Care | Payer: Managed Care, Other (non HMO) | Source: Ambulatory Visit | Attending: Neurological Surgery | Admitting: Neurological Surgery

## 2016-12-28 DIAGNOSIS — M4316 Spondylolisthesis, lumbar region: Secondary | ICD-10-CM

## 2016-12-28 MED ORDER — GADOBENATE DIMEGLUMINE 529 MG/ML IV SOLN
20.0000 mL | Freq: Once | INTRAVENOUS | Status: AC | PRN
  Administered 2016-12-28: 20 mL via INTRAVENOUS

## 2017-01-24 ENCOUNTER — Other Ambulatory Visit: Payer: Self-pay | Admitting: Neurological Surgery

## 2017-03-04 ENCOUNTER — Other Ambulatory Visit (HOSPITAL_COMMUNITY): Payer: Self-pay | Admitting: Neurological Surgery

## 2017-03-04 DIAGNOSIS — M4727 Other spondylosis with radiculopathy, lumbosacral region: Secondary | ICD-10-CM

## 2017-03-18 ENCOUNTER — Encounter (HOSPITAL_COMMUNITY): Payer: Self-pay

## 2017-03-18 ENCOUNTER — Ambulatory Visit (HOSPITAL_COMMUNITY): Payer: Managed Care, Other (non HMO)

## 2017-03-29 ENCOUNTER — Inpatient Hospital Stay: Admit: 2017-03-29 | Payer: Managed Care, Other (non HMO) | Admitting: Neurological Surgery

## 2017-03-29 SURGERY — ANTERIOR LATERAL LUMBAR FUSION 3 LEVELS
Anesthesia: General

## 2017-06-05 ENCOUNTER — Encounter (INDEPENDENT_AMBULATORY_CARE_PROVIDER_SITE_OTHER): Payer: Self-pay | Admitting: Internal Medicine

## 2017-06-05 ENCOUNTER — Telehealth (INDEPENDENT_AMBULATORY_CARE_PROVIDER_SITE_OTHER): Payer: Self-pay | Admitting: *Deleted

## 2017-06-05 ENCOUNTER — Encounter (INDEPENDENT_AMBULATORY_CARE_PROVIDER_SITE_OTHER): Payer: Self-pay

## 2017-06-05 ENCOUNTER — Encounter (INDEPENDENT_AMBULATORY_CARE_PROVIDER_SITE_OTHER): Payer: Self-pay | Admitting: *Deleted

## 2017-06-05 ENCOUNTER — Other Ambulatory Visit (INDEPENDENT_AMBULATORY_CARE_PROVIDER_SITE_OTHER): Payer: Self-pay | Admitting: Internal Medicine

## 2017-06-05 ENCOUNTER — Ambulatory Visit (INDEPENDENT_AMBULATORY_CARE_PROVIDER_SITE_OTHER): Payer: Managed Care, Other (non HMO) | Admitting: Internal Medicine

## 2017-06-05 VITALS — BP 152/90 | HR 64 | Temp 97.4°F | Ht 74.0 in | Wt 318.7 lb

## 2017-06-05 DIAGNOSIS — K625 Hemorrhage of anus and rectum: Secondary | ICD-10-CM | POA: Diagnosis not present

## 2017-06-05 DIAGNOSIS — K760 Fatty (change of) liver, not elsewhere classified: Secondary | ICD-10-CM

## 2017-06-05 LAB — CBC WITH DIFFERENTIAL/PLATELET
Basophils Absolute: 101 cells/uL (ref 0–200)
Basophils Relative: 1.3 %
EOS ABS: 211 {cells}/uL (ref 15–500)
Eosinophils Relative: 2.7 %
HCT: 44.6 % (ref 38.5–50.0)
HEMOGLOBIN: 14.8 g/dL (ref 13.2–17.1)
Lymphs Abs: 1966 cells/uL (ref 850–3900)
MCH: 26.6 pg — AB (ref 27.0–33.0)
MCHC: 33.2 g/dL (ref 32.0–36.0)
MCV: 80.2 fL (ref 80.0–100.0)
MPV: 12.3 fL (ref 7.5–12.5)
Monocytes Relative: 6.9 %
NEUTROS ABS: 4984 {cells}/uL (ref 1500–7800)
Neutrophils Relative %: 63.9 %
Platelets: 141 10*3/uL (ref 140–400)
RBC: 5.56 10*6/uL (ref 4.20–5.80)
RDW: 14 % (ref 11.0–15.0)
Total Lymphocyte: 25.2 %
WBC: 7.8 10*3/uL (ref 3.8–10.8)
WBCMIX: 538 {cells}/uL (ref 200–950)

## 2017-06-05 LAB — HEPATIC FUNCTION PANEL
AG RATIO: 1.3 (calc) (ref 1.0–2.5)
ALBUMIN MSPROF: 4.1 g/dL (ref 3.6–5.1)
ALT: 32 U/L (ref 9–46)
AST: 26 U/L (ref 10–35)
Alkaline phosphatase (APISO): 76 U/L (ref 40–115)
Bilirubin, Direct: 0.1 mg/dL (ref 0.0–0.2)
GLOBULIN: 3.1 g/dL (ref 1.9–3.7)
Indirect Bilirubin: 0.4 mg/dL (calc) (ref 0.2–1.2)
TOTAL PROTEIN: 7.2 g/dL (ref 6.1–8.1)
Total Bilirubin: 0.5 mg/dL (ref 0.2–1.2)

## 2017-06-05 MED ORDER — PEG 3350-KCL-NA BICARB-NACL 420 G PO SOLR: 4000.0000 mL | Freq: Once | ORAL | 0 refills | Status: AC

## 2017-06-05 NOTE — Progress Notes (Addendum)
Subjective:    Patient ID: Billy Davis, male    DOB: September 13, 1955, 61 y.o.   MRN: [id number]  HPI Presents today with c/o lower abdominal pain. Has had pain for several weeks. Patient is requesting a colonoscopy. Has has actually had this lower abdominal pain for years. States though pain has increased.  He feels like something is wrong.  Rates pain at a 3 . Describes as a nagging type pain.  He says he saw blood in his stool a couple of weeks ago. He states he did not strain.  He is having a BM 2-3 times a day which is normal for him. Has not seen any bleeding recently.  Appetite is okay. Weight has remained the same.  Hx of prostate cancer in the past.   Last seen in April of this year.  Hx of cirrhosis secondary to NAFLD. HX of GERD. EGD in June of 2012 negative for esophageal varices.   08/17/2011 Colonoscopy with snare polypectomy.  Impression:  Normal terminal ileum. U scattered diverticula at cecum, ascending and sigmoid colon. Two 6 mm polyps snared from distal sigmoid colon and submitted together. Three 2-3 mm polyps cold snared from rectum and submitted together. Internal hemorrhoids.  Patient called and message/results left on his answering service. Polyps hyperplastic. Report to PCP. Next colonoscopy in 10 years.   Hepatic Function Latest Ref Rng & Units 07/05/2016 11/09/2013 03/31/2013  Total Protein 6.5 - 8.1 g/dL 7.3 6.9 7.0  Albumin 3.5 - 5.0 g/dL 4.3 4.0 4.0  AST 15 - 41 U/L 36 28 46(H)  ALT 17 - 63 U/L 39 32 40  Alk Phosphatase 38 - 126 U/L 54 60 68  Total Bilirubin 0.3 - 1.2 mg/dL 1.3(H) 0.7 0.7  Bilirubin, Direct 0.1 - 0.5 mg/dL 0.2 - -   11/26/2016 US abdomen complete:   IMPRESSION: Predominantly echogenic mass within the interpolar region of the right kidney. 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 Recommend follow-up ultrasound in 6 months to assess for stability.  Nodular  contour of the liver suggestive of hepatic cirrhosis. Increased echogenicity most compatible with associated fatty infiltration.    Review of Systems Past Medical History:  Diagnosis Date  . 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 CAD, multiple vessel    PCI circumflex 2008, diffuse disease of the right and LAD treated medically.,  //   Nuclear 2010 no ischemia   //   nuclear May, 2012 no ischemia  . Cirrhosis of liver without mention of alcohol   . Contrast media allergy    pt states he is not allergy to contrast  . Diabetes mellitus without complication ([place])    type 2  . Diverticulosis   . Dysphagia   . Ejection fraction    EF 55%, nuclear, May, 2012  . Esophageal reflux   . IBS (irritable bowel syndrome)   . Lower abdominal pain   . Mixed dyslipidemia   . Morbid obesity ([place])   . Other chronic nonalcoholic liver disease   . Postsurgical percutaneous transluminal coronary angioplasty status   . Preop cardiovascular exam    Cardiovascular clearance for prostate surgery October, 2013  . Prostate cancer ([place]) 03/24/12  . Unspecified diastolic heart failure   . Unspecified essential hypertension     Past Surgical History:  Procedure Laterality Date  . BACK SURGERY     took cyst out of back  .  CARDIAC CATHETERIZATION    . CARPAL TUNNEL RELEASE Bilateral   . CHOLECYSTECTOMY  06/1998  . COLONOSCOPY  03/03/03  . COLONOSCOPY  08/17/2011   Procedure: COLONOSCOPY;  Surgeon: Rogene Houston, MD;  Location: [facility] ENDO SUITE;  Service: Endoscopy;  Laterality: N/A;  1045  . LEFT HEART CATHETERIZATION WITH CORONARY ANGIOGRAM N/A 12/07/2013   Procedure: LEFT HEART CATHETERIZATION WITH CORONARY ANGIOGRAM;  Surgeon: Peter M Martinique, MD;  Location: [facility] C[facility] LAB;  Service: Cardiovascular;  Laterality: N/A;  . left rotator    . RIGHT KNEE SURGERY    . ROBOT ASSISTED LAPAROSCOPIC RADICAL PROSTATECTOMY  06/26/2012   Procedure: ROBOTIC  ASSISTED LAPAROSCOPIC RADICAL PROSTATECTOMY LEVEL 3;  Surgeon: Dutch Gray, MD;  Location: [facility] ORS;  Service: Urology;  Laterality: N/A;      . Two cardiac stents    . UPPER GASTROINTESTINAL ENDOSCOPY  02/21/2011   EGD ED  . UPPER GASTROINTESTINAL ENDOSCOPY  03/03/03   TCS  . VASECTOMY      Allergies  Allergen Reactions  . Codeine Palpitations  . Plavix [Clopidogrel Bisulfate] Rash    Current Outpatient Prescriptions on File Prior to Visit  Medication Sig Dispense Refill  . amLODipine (NORVASC) 5 MG tablet Take 5 mg by mouth daily.    Marland Kitchen aspirin EC 81 MG tablet Take 81 mg by mouth daily.    . chlorthalidone (HYGROTON) 25 MG tablet TAKE 1 TABLET DAILY 30 tablet 0  . dexlansoprazole (DEXILANT) 60 MG capsule Take 1 capsule (60 mg total) by mouth daily. 90 capsule 3  . dicyclomine (BENTYL) 10 MG capsule TAKE 1 CAPSULE DAILY 60 capsule 5  . LANTUS SOLOSTAR 100 UNIT/ML Solostar Pen Inject 75 Units into the skin daily.     Marland Kitchen lisinopril (PRINIVIL,ZESTRIL) 40 MG tablet Take 40 mg by mouth every morning.     . metoprolol (LOPRESSOR) 50 MG tablet Take 50 mg by mouth daily.    . Multiple Vitamin (MULTI VITAMIN MENS PO) Take 1 tablet by mouth daily.     Marland Kitchen NITROSTAT 0.4 MG SL tablet Place 1 tablet under the tongue every 5 (five) minutes as needed. For chest pains    . pantoprazole (PROTONIX) 40 MG tablet Take 40 mg by mouth daily.     No current facility-administered medications on file prior to visit.         Objective:   Physical Exam Blood pressure (!) 152/90, pulse 64, temperature (!) 97.4 F (36.3 C), height 6' 2"  (1.88 m), weight (!) 318 lb 11.2 oz (144.6 kg). Alert and oriented. Skin warm and dry. Oral mucosa is moist.   . Sclera anicteric, conjunctivae is pink. Thyroid not enlarged. No cervical lymphadenopathy. Lungs clear. Heart regular rate and rhythm.  Abdomen is soft. Bowel sounds are positive. No hepatomegaly. No abdominal masses felt. No tenderness.  No edema to lower  extremities.   Rectal exam: no masses. Guaiac negative.        Assessment & Plan:  NAFLD: Hepatic and CBC Rectal bleeding: Guaiac negative today. CBC as above.   . Colonoscopy.  I discussed with Dr. Laural Golden. Colonic neoplasm needs to be ruled.

## 2017-06-05 NOTE — Patient Instructions (Signed)
Labs today. Colonoscopy.  The risks of bleeding, perforation and infection were reviewed with patient.

## 2017-06-05 NOTE — Telephone Encounter (Signed)
Patient needs trilyte 

## 2017-06-06 DIAGNOSIS — K625 Hemorrhage of anus and rectum: Secondary | ICD-10-CM | POA: Insufficient documentation

## 2017-06-27 ENCOUNTER — Ambulatory Visit (INDEPENDENT_AMBULATORY_CARE_PROVIDER_SITE_OTHER): Payer: Managed Care, Other (non HMO)

## 2017-06-27 ENCOUNTER — Encounter (INDEPENDENT_AMBULATORY_CARE_PROVIDER_SITE_OTHER): Payer: Self-pay | Admitting: Orthopaedic Surgery

## 2017-06-27 ENCOUNTER — Ambulatory Visit (INDEPENDENT_AMBULATORY_CARE_PROVIDER_SITE_OTHER): Payer: Managed Care, Other (non HMO) | Admitting: Orthopaedic Surgery

## 2017-06-27 VITALS — BP 147/90 | HR 69 | Ht 74.0 in | Wt 318.0 lb

## 2017-06-27 DIAGNOSIS — M25561 Pain in right knee: Secondary | ICD-10-CM

## 2017-06-27 DIAGNOSIS — M1711 Unilateral primary osteoarthritis, right knee: Secondary | ICD-10-CM | POA: Diagnosis not present

## 2017-06-27 NOTE — Progress Notes (Signed)
Office Visit Note   Patient: Billy Davis           Date of Birth: 02-01-1956           MRN: [id number] Visit Date: 06/27/2017              Requested by: [name], MD [address] [place], [place] [address] PCP: [name], MD   Assessment & Plan: Visit Diagnoses:  1. Acute pain of right knee   2. Unilateral primary osteoarthritis, right knee     Plan: 50 mL clear yellow fluid aspirated. No cloudiness noted. Injection with half cc cortisone and Marcaine. He'll watch his sugars carefully office follow-up if he has persistent problems.  Follow-Up Instructions: Return if symptoms worsen or fail to improve.   Orders:  Orders Placed This Encounter  Procedures  . Large Joint Injection/Arthrocentesis  . XR KNEE 3 VIEW RIGHT   No orders of the defined types were placed in this encounter.     Procedures: Large Joint Inj Date/Time: 06/27/2017 9:50 AM Performed by: Marybelle Killings Authorized by: Marybelle Killings   Consent Given by:  Patient Indications:  Pain and joint swelling Location:  Knee Site:  R knee Needle Size:  22 G Needle Length:  1.5 inches Approach:  Anterolateral Ultrasound Guidance: No   Fluoroscopic Guidance: No   Arthrogram: No   Medications:  4 mL bupivacaine 0.25 %; 1 mL lidocaine 1 %; 13.33 mg methylPREDNISolone acetate 40 MG/ML Aspiration Attempted: Yes   Aspirate amount (mL):  50 Aspirate:  Yellow Patient tolerance:  Patient tolerated the procedure well with no immediate complications      Clinical Data: No additional findings.   Subjective: Chief Complaint  Patient presents with  . Right Knee - Pain    HPI this-year-old male returns had previous knee arthroscopy with the grade 2 and 3 changes in the lateral compartment and also degenerative medial meniscal tear in 2008. Recently he was watering his chickens and had increased pain return with development of a knee effusion pain difficulty walking he's been using a cane is used  anti-inflammatories and some ice with persist symptoms.  Review of Systems positive for previous instrument fusion L5-S1. He has some mild recurrent stenosis and moderate to severe neural foraminal narrowing at L4-5. The he has talked with the neurosurgeons but additional level surgery. He has some problems with his right foot numbness. Positive for obesity, IBS history prostate cancer, GERD, hypertension, diabetes on insulin. Otherwise negative as it pertains history of present illness.   Objective: Vital Signs: BP (!) 147/90   Pulse 69   Ht 6' 2"  (1.88 m)   Wt (!) 318 lb (144.2 kg)   BMI 40.83 kg/m   Physical Exam  Constitutional: He is oriented to person, place, and time. He appears well-developed and well-nourished.  HENT:  Head: Normocephalic and atraumatic.  Eyes: Pupils are equal, round, and reactive to light. EOM are normal.  Neck: No tracheal deviation present. No thyromegaly present.  Cardiovascular: Normal rate.   Pulmonary/Chest: Effort normal. He has no wheezes.  Abdominal: Soft. Bowel sounds are normal.  Neurological: He is alert and oriented to person, place, and time.  Skin: Skin is warm and dry. Capillary refill takes less than 2 seconds.  Psychiatric: He has a normal mood and affect. His behavior is normal. Judgment and thought content normal.    Ortho Exam well-healed lumbar incision. He has decreased sensation of his right foot. Has a weakness on  right foot dorsiflexion of ankle as well as EHL. Knee has a 3+ knee effusion. Crepitus with range of motion. Good capillary refill to his ankles. No plantar foot calluses are ulcers.  Specialty Comments:  No specialty comments available.  Imaging: Xr Knee 3 View Right  Result Date: 06/27/2017 AP lateral left right knee standing AP demonstrates some joint space narrowing. There is evidence of old proximal fibular avulsion injury. No acute fractures are noted. Impression moderate knee osteoarthritis tricompartmental.  Evidence of old fibular injury.    PMFS History: Patient Active Problem List   Diagnosis Date Noted  . Rectal bleeding 06/06/2017  . 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 DM (diabetes mellitus) ([place]) 11/09/2013  . Unspecified diastolic heart failure   . Morbid obesity ([place])   . IBS (irritable bowel syndrome)   . Ejection fraction   . CAD, multiple vessel   . Contrast media allergy   . Aneurysm ([place])   . Preop cardiovascular exam   . Prostate cancer ([place]) 03/24/2012  . Diastolic dysfunction 19/41/7408  . GERD (gastroesophageal reflux disease) 12/27/2010  . Hypertension 12/27/2010  . DYSLIPIDEMIA 12/26/2009  . FATTY LIVER DISEASE 12/26/2009   Past Medical History:  Diagnosis Date  . 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 CAD, multiple vessel    PCI circumflex 2008, diffuse disease of the right and LAD treated medically.,  //   Nuclear 2010 no ischemia   //   nuclear May, 2012 no ischemia  . Cirrhosis of liver without mention of alcohol   . Contrast media allergy    pt states he is not allergy to contrast  . Diabetes mellitus without complication ([place])    type 2  . Diverticulosis   . Dysphagia   . Ejection fraction    EF 55%, nuclear, May, 2012  . Esophageal reflux   . IBS (irritable bowel syndrome)   . Lower abdominal pain   . Mixed dyslipidemia   . Morbid obesity ([place])   . Other chronic nonalcoholic liver disease   . Postsurgical percutaneous transluminal coronary angioplasty status   . Preop cardiovascular exam    Cardiovascular clearance for prostate surgery October, 2013  . Prostate cancer ([place]) 03/24/12  . Unspecified diastolic heart failure   . Unspecified essential hypertension     Family History  Problem Relation Age of Onset  . Heart disease Mother   . Breast cancer Mother   . Heart disease Father   . Breast cancer Sister   . Ovarian cancer Sister   . Heart disease  Brother   . Healthy Daughter   . Healthy Daughter   . Healthy Daughter   . Healthy Daughter   . Coronary artery disease Unknown        family history    Past Surgical History:  Procedure Laterality Date  . BACK SURGERY     took cyst out of back  . CARDIAC CATHETERIZATION    . CARPAL TUNNEL RELEASE Bilateral   . CHOLECYSTECTOMY  06/1998  . COLONOSCOPY  03/03/03  . COLONOSCOPY  08/17/2011   Procedure: COLONOSCOPY;  Surgeon: Rogene Houston, MD;  Location: [facility];  Service: Endoscopy;  Laterality: N/A;  1045  . LEFT HEART CATHETERIZATION WITH CORONARY ANGIOGRAM N/A 12/07/2013   Procedure: LEFT HEART CATHETERIZATION WITH CORONARY ANGIOGRAM;  Surgeon: Peter M Martinique, MD;  Location: [facility] CATH LAB;  Service: Cardiovascular;  Laterality: N/A;  . left rotator    .  RIGHT KNEE SURGERY    . ROBOT ASSISTED LAPAROSCOPIC RADICAL PROSTATECTOMY  06/26/2012   Procedure: ROBOTIC ASSISTED LAPAROSCOPIC RADICAL PROSTATECTOMY LEVEL 3;  Surgeon: Dutch Gray, MD;  Location: [facility] ORS;  Service: Urology;  Laterality: N/A;      . Two cardiac stents    . UPPER GASTROINTESTINAL ENDOSCOPY  02/21/2011   EGD ED  . UPPER GASTROINTESTINAL ENDOSCOPY  03/03/03   TCS  . VASECTOMY     Social History   Occupational History  . [employer]    Social History Main Topics  . Smoking status: Former Smoker    Packs/day: 3.00    Years: 30.00    Types: Cigarettes    Quit date: 08/27/1998  . Smokeless tobacco: Never Used     Comment: Pt quit smoking 10 yrs ago, smoked for about 30 yrs.  . Alcohol use No  . Drug use: No  . Sexual activity: Not on file

## 2017-07-05 ENCOUNTER — Encounter (HOSPITAL_COMMUNITY): Payer: Self-pay

## 2017-07-05 ENCOUNTER — Other Ambulatory Visit: Payer: Self-pay

## 2017-07-05 ENCOUNTER — Encounter (HOSPITAL_COMMUNITY)
Admission: RE | Disposition: A | Discharge status: Home / Self Care | Payer: Self-pay | Source: Ambulatory Visit | Attending: Internal Medicine

## 2017-07-05 ENCOUNTER — Ambulatory Visit (HOSPITAL_COMMUNITY)
Admission: RE | Admit: 2017-07-05 | Discharge: 2017-07-05 | Disposition: A | Discharge status: Home / Self Care | Payer: Managed Care, Other (non HMO) | Source: Ambulatory Visit | Attending: Internal Medicine | Admitting: Internal Medicine

## 2017-07-05 DIAGNOSIS — K5730 Diverticulosis of large intestine without perforation or abscess without bleeding: Secondary | ICD-10-CM

## 2017-07-05 DIAGNOSIS — Z794 Long term (current) use of insulin: Secondary | ICD-10-CM | POA: Diagnosis not present

## 2017-07-05 DIAGNOSIS — I5032 Chronic diastolic (congestive) heart failure: Secondary | ICD-10-CM | POA: Insufficient documentation

## 2017-07-05 DIAGNOSIS — Z87891 Personal history of nicotine dependence: Secondary | ICD-10-CM | POA: Insufficient documentation

## 2017-07-05 DIAGNOSIS — K648 Other hemorrhoids: Secondary | ICD-10-CM | POA: Diagnosis not present

## 2017-07-05 DIAGNOSIS — Z888 Allergy status to other drugs, medicaments and biological substances status: Secondary | ICD-10-CM | POA: Insufficient documentation

## 2017-07-05 DIAGNOSIS — Z79899 Other long term (current) drug therapy: Secondary | ICD-10-CM | POA: Diagnosis not present

## 2017-07-05 DIAGNOSIS — Z7982 Long term (current) use of aspirin: Secondary | ICD-10-CM | POA: Diagnosis not present

## 2017-07-05 DIAGNOSIS — E119 Type 2 diabetes mellitus without complications: Secondary | ICD-10-CM | POA: Insufficient documentation

## 2017-07-05 DIAGNOSIS — I2510 Atherosclerotic heart disease of native coronary artery without angina pectoris: Secondary | ICD-10-CM | POA: Diagnosis not present

## 2017-07-05 DIAGNOSIS — Z8546 Personal history of malignant neoplasm of prostate: Secondary | ICD-10-CM | POA: Insufficient documentation

## 2017-07-05 DIAGNOSIS — I110 Hypertensive heart disease with heart failure: Secondary | ICD-10-CM | POA: Insufficient documentation

## 2017-07-05 DIAGNOSIS — K219 Gastro-esophageal reflux disease without esophagitis: Secondary | ICD-10-CM | POA: Insufficient documentation

## 2017-07-05 DIAGNOSIS — K625 Hemorrhage of anus and rectum: Secondary | ICD-10-CM | POA: Insufficient documentation

## 2017-07-05 DIAGNOSIS — E782 Mixed hyperlipidemia: Secondary | ICD-10-CM | POA: Insufficient documentation

## 2017-07-05 DIAGNOSIS — Z885 Allergy status to narcotic agent status: Secondary | ICD-10-CM | POA: Diagnosis not present

## 2017-07-05 HISTORY — PX: COLONOSCOPY: SHX5424

## 2017-07-05 LAB — GLUCOSE, CAPILLARY: GLUCOSE-CAPILLARY: 109 mg/dL — AB (ref 65–99)

## 2017-07-05 SURGERY — COLONOSCOPY
Anesthesia: Moderate Sedation

## 2017-07-05 MED ORDER — SODIUM CHLORIDE 0.9 % IV SOLN
INTRAVENOUS | Status: DC
  Administered 2017-07-05: 14:00:00 via INTRAVENOUS

## 2017-07-05 MED ORDER — MIDAZOLAM HCL 5 MG/5ML IJ SOLN
INTRAMUSCULAR | Status: DC | PRN
  Administered 2017-07-05: 3 mg via INTRAVENOUS
  Administered 2017-07-05 (×2): 2 mg via INTRAVENOUS

## 2017-07-05 MED ORDER — MIDAZOLAM HCL 5 MG/5ML IJ SOLN
INTRAMUSCULAR | Status: AC
  Filled 2017-07-05: qty 10

## 2017-07-05 MED ORDER — MEPERIDINE HCL 50 MG/ML IJ SOLN
INTRAMUSCULAR | Status: AC
  Filled 2017-07-05: qty 1

## 2017-07-05 MED ORDER — STERILE WATER FOR IRRIGATION IR SOLN
Status: DC | PRN
  Administered 2017-07-05: 100 mL

## 2017-07-05 MED ORDER — MEPERIDINE HCL 50 MG/ML IJ SOLN
INTRAMUSCULAR | Status: DC | PRN
  Administered 2017-07-05 (×2): 25 mg via INTRAVENOUS

## 2017-07-05 NOTE — Op Note (Signed)
[facility] Patient Name: Billy Davis Procedure Date: 07/05/2017 2:26 PM MRN: [id number] Date of Birth: 11-Feb-1956 Attending MD: Hildred Laser , MD CSN: [id number] Age: 61 Admit Type: Outpatient Procedure:                Colonoscopy Indications:              Rectal bleeding Providers:                Hildred Laser, MD, Rosina Lowenstein, RN, Randa Spike, Technician Referring MD:             [name], MD Medicines:                Meperidine 50 mg IV, Midazolam 7 mg IV Complications:            No immediate complications. Estimated Blood Loss:     Estimated blood loss: none. Procedure:                Pre-Anesthesia Assessment:                           - Prior to the procedure, a History and Physical                            was performed, and patient medications and                            allergies were reviewed. The patient's tolerance of                            previous anesthesia was also reviewed. The risks                            and benefits of the procedure and the sedation                            options and risks were discussed with the patient.                            All questions were answered, and informed consent                            was obtained. Prior Anticoagulants: The patient                            last took aspirin 3 days prior to the procedure.                            ASA Grade Assessment: III - A patient with severe                            systemic disease. After reviewing the risks and  benefits, the patient was deemed in satisfactory                            condition to undergo the procedure.                           After obtaining informed consent, the colonoscope                            was passed under direct vision. Throughout the                            procedure, the patient's blood pressure, pulse, and                            oxygen saturations were  monitored continuously. The                            [id number] ([id number]) scope was introduced through                            the anus and advanced to the the cecum, identified                            by appendiceal orifice and ileocecal valve. The                            colonoscopy was performed without difficulty. The                            patient tolerated the procedure well. The quality                            of the bowel preparation was adequate. The                            ileocecal valve, appendiceal orifice, and rectum                            were photographed. Scope In: 3:07:56 PM Scope Out: 3:21:22 PM Scope Withdrawal Time: 0 hours 10 minutes 3 seconds  Total Procedure Duration: 0 hours 13 minutes 26 seconds  Findings:      The perianal and digital rectal examinations were normal.      A few small-mouthed diverticula were found in the sigmoid colon and       cecum.      Internal hemorrhoids were found during retroflexion. The hemorrhoids       were small. Impression:               - Diverticulosis in the sigmoid colon and in the                            cecum.                           -  Internal hemorrhoids.                           - No specimens collected. Moderate Sedation:      Moderate (conscious) sedation was administered by the endoscopy nurse       and supervised by the endoscopist. The following parameters were       monitored: oxygen saturation, heart rate, blood pressure, CO2       capnography and response to care. Total physician intraservice time was       20 minutes. Recommendation:           - Patient has a contact number available for                            emergencies. The signs and symptoms of potential                            delayed complications were discussed with the                            patient. Return to normal activities tomorrow.                            Written discharge instructions were provided to  the                            patient.                           - High fiber diet and diabetic (ADA) diet today.                           - Continue present medications.                           - Resume aspirin at prior dose today.                           - Repeat colonoscopy in 10 years for screening                            purposes. Procedure Code(s):        --- Professional ---                           [phone], Colonoscopy, flexible; diagnostic, including                            collection of specimen(s) by brushing or washing,                            when performed (separate procedure)                           99152, Moderate sedation services provided by the  same physician or other qualified health care                            professional performing the diagnostic or                            therapeutic service that the sedation supports,                            requiring the presence of an independent trained                            observer to assist in the monitoring of the                            patient's level of consciousness and physiological                            status; initial 15 minutes of intraservice time,                            patient age 55 years or older Diagnosis Code(s):        --- Professional ---                           K64.8, Other hemorrhoids                           K62.5, Hemorrhage of anus and rectum                           K57.30, Diverticulosis of large intestine without                            perforation or abscess without bleeding CPT copyright 2016 American Medical Association. All rights reserved. The codes documented in this report are preliminary and upon coder review may  be revised to meet current compliance requirements. Hildred Laser, MD Hildred Laser, MD 07/05/2017 3:28:11 PM This report has been signed electronically. Number of Addenda: 0

## 2017-07-05 NOTE — H&P (Signed)
Billy Davis is an 61 y.o. male.   Chief Complaint: Patient is here for colonoscopy. HPI: Patient is a 40-year-old Caucasian male with multiple medical problems who experienced an episode of hematochezia a few weeks ago.  He states he passed moderate amount of blood.  He has a history of IBS and diarrhea.  He never has been constipated.  He also complains of intermittent hypogastric pain.  He has good appetite.  He is on low-dose aspirin which is on hold.  Last colonoscopy was in December 2012 with removal of a few small polyps and these were hyperplastic. Family history is negative for CRC.  Past Medical History:  Diagnosis Date  . Aneurysm ([place])    right femoral pseudoaneurysm, thrombosed (following 03/20/07 cardiac cath)  . CAD, multiple vessel    PCI circumflex 2008, diffuse disease of the right and LAD treated medically.,  //   Nuclear 2010 no ischemia   //   nuclear May, 2012 no ischemia  . Cirrhosis of liver without mention of alcohol   . Contrast media allergy    pt states he is not allergy to contrast  . Diabetes mellitus without complication ([place])    type 2  . Diverticulosis   . Dysphagia   . Ejection fraction    EF 55%, nuclear, May, 2012  . Esophageal reflux   . IBS (irritable bowel syndrome)   . Lower abdominal pain   . Mixed dyslipidemia   . Morbid obesity ([place])   . Other chronic nonalcoholic liver disease   . Postsurgical percutaneous transluminal coronary angioplasty status   . Preop cardiovascular exam    Cardiovascular clearance for prostate surgery October, 2013  . Prostate cancer ([place]) 03/24/12  . Unspecified diastolic heart failure   . Unspecified essential hypertension     Past Surgical History:  Procedure Laterality Date  . BACK SURGERY     took cyst out of back  . CARDIAC CATHETERIZATION    . CARPAL TUNNEL RELEASE Bilateral   . CHOLECYSTECTOMY  06/1998  . COLONOSCOPY  03/03/03  . left rotator    . RIGHT KNEE SURGERY    . Two cardiac stents    .  UPPER GASTROINTESTINAL ENDOSCOPY  02/21/2011   EGD ED  . UPPER GASTROINTESTINAL ENDOSCOPY  03/03/03   TCS  . VASECTOMY      Family History  Problem Relation Age of Onset  . Heart disease Mother   . Breast cancer Mother   . Heart disease Father   . Breast cancer Sister   . Ovarian cancer Sister   . Heart disease Brother   . Healthy Daughter   . Healthy Daughter   . Healthy Daughter   . Healthy Daughter   . Coronary artery disease Unknown        family history   Social History:  reports that he quit smoking about 18 years ago. His smoking use included cigarettes. He has a 90.00 pack-year smoking history. he has never used smokeless tobacco. He reports that he does not drink alcohol or use drugs.  Allergies:  Allergies  Allergen Reactions  . Codeine Palpitations  . Plavix [Clopidogrel Bisulfate] Rash    Medications Prior to Admission  Medication Sig Dispense Refill  . amLODipine (NORVASC) 5 MG tablet Take 5 mg by mouth daily.    Marland Kitchen aspirin EC 81 MG tablet Take 81 mg by mouth daily.    . chlorthalidone (HYGROTON) 25 MG tablet TAKE 1 TABLET DAILY 30 tablet 0  .  diazepam (VALIUM) 2 MG tablet Take 2 mg by mouth every 6 (six) hours as needed for anxiety or muscle spasms.    . diclofenac (VOLTAREN) 75 MG EC tablet Take 75 mg by mouth daily as needed for moderate pain.    Marland Kitchen dicyclomine (BENTYL) 10 MG capsule TAKE 1 CAPSULE DAILY 60 capsule 5  . gabapentin (NEURONTIN) 300 MG capsule Take 300 mg by mouth 2 (two) times daily as needed (neuropathic pain).    Marland Kitchen ibuprofen (ADVIL,MOTRIN) 200 MG tablet Take 600 mg by mouth daily.    . insulin degludec (TRESIBA FLEXTOUCH) 100 UNIT/ML SOPN FlexTouch Pen Inject 76 Units into the skin daily at 10 pm.    . lisinopril (PRINIVIL,ZESTRIL) 40 MG tablet Take 40 mg by mouth every morning.     . metoprolol (LOPRESSOR) 50 MG tablet Take 50 mg by mouth daily.    . Multiple Vitamin (MULTI VITAMIN MENS PO) Take 1 tablet by mouth daily.     . pantoprazole  (PROTONIX) 40 MG tablet Take 40 mg by mouth daily.    Marland Kitchen dexlansoprazole (DEXILANT) 60 MG capsule Take 1 capsule (60 mg total) by mouth daily. (Patient not taking: Reported on 06/28/2017) 90 capsule 3  . NITROSTAT 0.4 MG SL tablet Place 1 tablet under the tongue every 5 (five) minutes as needed. For chest pains      Results for orders placed or performed during the hospital encounter of 07/05/17 (from the past 48 hour(s))  Glucose, capillary     Status: Abnormal   Collection Time: 07/05/17  1:39 PM  Result Value Ref Range   Glucose-Capillary 109 (H) 65 - 99 mg/dL   No results found.  ROS  Blood pressure (!) 149/96, pulse 79, temperature 98.6 F (37 C), temperature source Oral, resp. rate 15, SpO2 94 %. Physical Exam  Constitutional: He appears well-developed and well-nourished.  HENT:  Mouth/Throat: Oropharynx is clear and moist.  Eyes: Conjunctivae are normal. No scleral icterus.  Neck: No thyromegaly present.  Cardiovascular: Normal rate, regular rhythm and normal heart sounds.  No murmur heard. Respiratory: Effort normal and breath sounds normal.  GI:  Abdomen is full.  Laparoscopy scars noted across lower abdomen.  Abdomen is soft with mild tenderness in hypogastric region LLQ and LUQ and RLQ.  No organomegaly or masses.  Musculoskeletal: He exhibits edema.  Trace edema around ankles.  Lymphadenopathy:    He has no cervical adenopathy.  Neurological: He is alert.  Skin: Skin is warm and dry.     Assessment/Plan Rectal bleeding. Diagnostic colonoscopy.  Hildred Laser, MD 07/05/2017, 2:54 PM

## 2017-07-05 NOTE — Discharge Instructions (Signed)
Resume usual medications as before. High fiber diet. No driving for 24 hours. Next colonoscopy in 10 years.    Colonoscopy, Adult, Care After This sheet gives you information about how to care for yourself after your procedure. Your doctor may also give you more specific instructions. If you have problems or questions, call your doctor. Follow these instructions at home: General instructions   For the first 24 hours after the procedure: ? Do not drive or use machinery. ? Do not sign important documents. ? Do not drink alcohol. ? Do your daily activities more slowly than normal. ? Eat foods that are soft and easy to digest. ? Rest often.  Take over-the-counter or prescription medicines only as told by your doctor.  It is up to you to get the results of your procedure. Ask your doctor, or the department performing the procedure, when your results will be ready. To help cramping and bloating:  Try walking around.  Put heat on your belly (abdomen) as told by your doctor. Use a heat source that your doctor recommends, such as a moist heat pack or a heating pad. ? Put a towel between your skin and the heat source. ? Leave the heat on for 20-30 minutes. ? Remove the heat if your skin turns bright red. This is especially important if you cannot feel pain, heat, or cold. You can get burned. Eating and drinking  Drink enough fluid to keep your pee (urine) clear or pale yellow.  Return to your normal diet as told by your doctor. Avoid heavy or fried foods that are hard to digest.  Avoid drinking alcohol for as long as told by your doctor. Contact a doctor if:  You have blood in your poop (stool) 2-3 days after the procedure. Get help right away if:  You have more than a small amount of blood in your poop.  You see large clumps of tissue (blood clots) in your poop.  Your belly is swollen.  You feel sick to your stomach (nauseous).  You throw up (vomit).  You have a  fever.  You have belly pain that gets worse, and medicine does not help your pain. This information is not intended to replace advice given to you by your health care provider. Make sure you discuss any questions you have with your health care provider. Document Released: 09/15/2010 Document Revised: 05/07/2016 Document Reviewed: 05/07/2016 Elsevier Interactive Patient Education  2017 Else[employer] Inc.  Hemorrhoids Hemorrhoids are swollen veins in and around the rectum or anus. There are two types of hemorrhoids:  Internal hemorrhoids. These occur in the veins that are just inside the rectum. They may poke through to the outside and become irritated and painful.  External hemorrhoids. These occur in the veins that are outside of the anus and can be felt as a painful swelling or hard lump near the anus.  Most hemorrhoids do not cause serious problems, and they can be managed with home treatments such as diet and lifestyle changes. If home treatments do not help your symptoms, procedures can be done to shrink or remove the hemorrhoids. What are the causes? This condition is caused by increased pressure in the anal area. This pressure may result from various things, including:  Constipation.  Straining to have a bowel movement.  Diarrhea.  Pregnancy.  Obesity.  Sitting for long periods of time.  Heavy lifting or other activity that causes you to strain.  Anal sex.  What are the signs or symptoms? Symptoms of  this condition include:  Pain.  Anal itching or irritation.  Rectal bleeding.  Leakage of stool (feces).  Anal swelling.  One or more lumps around the anus.  How is this diagnosed? This condition can often be diagnosed through a visual exam. Other exams or tests may also be done, such as:  Examination of the rectal area with a gloved hand (digital rectal exam).  Examination of the anal canal using a small tube (anoscope).  A blood test, if you have lost a  significant amount of blood.  A test to look inside the colon (sigmoidoscopy or colonoscopy).  How is this treated? This condition can usually be treated at home. However, various procedures may be done if dietary changes, lifestyle changes, and other home treatments do not help your symptoms. These procedures can help make the hemorrhoids smaller or remove them completely. Some of these procedures involve surgery, and others do not. Common procedures include:  Rubber band ligation. Rubber bands are placed at the base of the hemorrhoids to cut off the blood supply to them.  Sclerotherapy. Medicine is injected into the hemorrhoids to shrink them.  Infrared coagulation. A type of light energy is used to get rid of the hemorrhoids.  Hemorrhoidectomy surgery. The hemorrhoids are surgically removed, and the veins that supply them are tied off.  Stapled hemorrhoidopexy surgery. A circular stapling device is used to remove the hemorrhoids and use staples to cut off the blood supply to them.  Follow these instructions at home: Eating and drinking  Eat foods that have a lot of fiber in them, such as whole grains, beans, nuts, fruits, and vegetables. Ask your health care provider about taking products that have added fiber (fiber supplements).  Drink enough fluid to keep your urine clear or pale yellow. Managing pain and swelling  Take warm sitz baths for 20 minutes, 3-4 times a day to ease pain and discomfort.  If directed, apply ice to the affected area. Using ice packs between sitz baths may be helpful. ? Put ice in a plastic bag. ? Place a towel between your skin and the bag. ? Leave the ice on for 20 minutes, 2-3 times a day. General instructions  Take over-the-counter and prescription medicines only as told by your health care provider.  Use medicated creams or suppositories as told.  Exercise regularly.  Go to the bathroom when you have the urge to have a bowel movement. Do not  wait.  Avoid straining to have bowel movements.  Keep the anal area dry and clean. Use wet toilet paper or moist towelettes after a bowel movement.  Do not sit on the toilet for long periods of time. This increases blood pooling and pain. Contact a health care provider if:  You have increasing pain and swelling that are not controlled by treatment or medicine.  You have uncontrolled bleeding.  You have difficulty having a bowel movement, or you are unable to have a bowel movement.  You have pain or inflammation outside the area of the hemorrhoids. This information is not intended to replace advice given to you by your health care provider. Make sure you discuss any questions you have with your health care provider. Document Released: 08/10/2000 Document Revised: 01/11/2016 Document Reviewed: 04/27/2015 Elsevier Interactive Patient Education  2017 Elsevier Inc.  Diverticulosis Diverticulosis is a condition that develops when small pouches (diverticula) form in the wall of the large intestine (colon). The colon is where water is absorbed and stool is formed. The pouches  form when the inside layer of the colon pushes through weak spots in the outer layers of the colon. You may have a few pouches or many of them. What are the causes? The cause of this condition is not known. What increases the risk? The following factors may make you more likely to develop this condition:  Being older than age 25. Your risk for this condition increases with age. Diverticulosis is rare among people younger than age 68. By age 53, many people have it.  Eating a low-fiber diet.  Having frequent constipation.  Being overweight.  Not getting enough exercise.  Smoking.  Taking over-the-counter pain medicines, like aspirin and ibuprofen.  Having a family history of diverticulosis.  What are the signs or symptoms? In most people, there are no symptoms of this condition. If you do have symptoms, they  may include:  Bloating.  Cramps in the abdomen.  Constipation or diarrhea.  Pain in the lower left side of the abdomen.  How is this diagnosed? This condition is most often diagnosed during an exam for other colon problems. Because diverticulosis usually has no symptoms, it often cannot be diagnosed independently. This condition may be diagnosed by:  Using a flexible scope to examine the colon (colonoscopy).  Taking an X-ray of the colon after dye has been put into the colon (barium enema).  Doing a CT scan.  How is this treated? You may not need treatment for this condition if you have never developed an infection related to diverticulosis. If you have had an infection before, treatment may include:  Eating a high-fiber diet. This may include eating more fruits, vegetables, and grains.  Taking a fiber supplement.  Taking a live bacteria supplement (probiotic).  Taking medicine to relax your colon.  Taking antibiotic medicines.  Follow these instructions at home:  Drink 6-8 glasses of water or more each day to prevent constipation.  Try not to strain when you have a bowel movement.  If you have had an infection before: ? Eat more fiber as directed by your health care provider or your diet and nutrition specialist (dietitian). ? Take a fiber supplement or probiotic, if your health care provider approves.  Take over-the-counter and prescription medicines only as told by your health care provider.  If you were prescribed an antibiotic, take it as told by your health care provider. Do not stop taking the antibiotic even if you start to feel better.  Keep all follow-up visits as told by your health care provider. This is important. Contact a health care provider if:  You have pain in your abdomen.  You have bloating.  You have cramps.  You have not had a bowel movement in 3 days. Get help right away if:  Your pain gets worse.  Your bloating becomes very  bad.  You have a fever or chills, and your symptoms suddenly get worse.  You vomit.  You have bowel movements that are bloody or black.  You have bleeding from your rectum. Summary  Diverticulosis is a condition that develops when small pouches (diverticula) form in the wall of the large intestine (colon).  You may have a few pouches or many of them.  This condition is most often diagnosed during an exam for other colon problems.  If you have had an infection related to diverticulosis, treatment may include increasing the fiber in your diet, taking supplements, or taking medicines. This information is not intended to replace advice given to you by your health  care provider. Make sure you discuss any questions you have with your health care provider. Document Released: 05/10/2004 Document Revised: 07/02/2016 Document Reviewed: 07/02/2016 Elsevier Interactive Patient Education  2017 [place].   High-Fiber Diet Fiber, also called dietary fiber, is a type of carbohydrate found in fruits, vegetables, whole grains, and beans. A high-fiber diet can have many health benefits. Your health care provider may recommend a high-fiber diet to help:  Prevent constipation. Fiber can make your bowel movements more regular.  Lower your cholesterol.  Relieve hemorrhoids, uncomplicated diverticulosis, or irritable bowel syndrome.  Prevent overeating as part of a weight-loss plan.  Prevent heart disease, type 2 diabetes, and certain cancers.  What is my plan? The recommended daily intake of fiber includes:  38 grams for men under age 46.  50 grams for men over age 17.  46 grams for women under age 86.  98 grams for women over age 3.  You can get the recommended daily intake of dietary fiber by eating a variety of fruits, vegetables, grains, and beans. Your health care provider may also recommend a fiber supplement if it is not possible to get enough fiber through your diet. What do I  need to know about a high-fiber diet?  Fiber supplements have not been widely studied for their effectiveness, so it is better to get fiber through food sources.  Always check the fiber content on thenutrition facts label of any prepackaged food. Look for foods that contain at least 5 grams of fiber per serving.  Ask your dietitian if you have questions about specific foods that are related to your condition, especially if those foods are not listed in the following section.  Increase your daily fiber consumption gradually. Increasing your intake of dietary fiber too quickly may cause bloating, cramping, or gas.  Drink plenty of water. Water helps you to digest fiber. What foods can I eat? Grains Whole-grain breads. Multigrain cereal. Oats and oatmeal. Brown rice. Barley. Bulgur wheat. Gaffney. Bran muffins. Popcorn. Rye wafer crackers. Vegetables Sweet potatoes. Spinach. Kale. Artichokes. Cabbage. Broccoli. Green peas. Carrots. Squash. Fruits Berries. Pears. Apples. Oranges. Avocados. Prunes and raisins. Dried figs. Meats and Other Protein Sources Navy, kidney, pinto, and soy beans. Split peas. Lentils. Nuts and seeds. Dairy Fiber-fortified yogurt. Beverages Fiber-fortified soy milk. Fiber-fortified orange juice. Other Fiber bars. The items listed above may not be a complete list of recommended foods or beverages. Contact your dietitian for more options. What foods are not recommended? Grains White bread. Pasta made with refined flour. White rice. Vegetables Fried potatoes. Canned vegetables. Well-cooked vegetables. Fruits Fruit juice. Cooked, strained fruit. Meats and Other Protein Sources Fatty cuts of meat. Fried Sales executive or fried fish. Dairy Milk. Yogurt. Cream cheese. Sour cream. Beverages Soft drinks. Other Cakes and pastries. Butter and oils. The items listed above may not be a complete list of foods and beverages to avoid. Contact your dietitian for more  information. What are some tips for including high-fiber foods in my diet?  Eat a wide variety of high-fiber foods.  Make sure that half of all grains consumed each day are whole grains.  Replace breads and cereals made from refined flour or white flour with whole-grain breads and cereals.  Replace white rice with brown rice, bulgur wheat, or millet.  Start the day with a breakfast that is high in fiber, such as a cereal that contains at least 5 grams of fiber per serving.  Use beans in place of meat in soups, salads,  or pasta.  Eat high-fiber snacks, such as berries, raw vegetables, nuts, or popcorn. This information is not intended to replace advice given to you by your health care provider. Make sure you discuss any questions you have with your health care provider. Document Released: 08/13/2005 Document Revised: 01/19/2016 Document Reviewed: 01/26/2014 Elsevier Interactive Patient Education  2017 [employer].

## 2017-07-09 ENCOUNTER — Encounter (HOSPITAL_COMMUNITY): Payer: Self-pay | Admitting: Internal Medicine

## 2017-07-09 MED ORDER — METHYLPREDNISOLONE ACETATE 40 MG/ML IJ SUSP
13.3300 mg | INTRAMUSCULAR | Status: AC | PRN
  Administered 2017-06-27: 13.33 mg via INTRA_ARTICULAR

## 2017-07-09 MED ORDER — BUPIVACAINE HCL 0.25 % IJ SOLN
4.0000 mL | INTRAMUSCULAR | Status: AC | PRN
  Administered 2017-06-27: 4 mL via INTRA_ARTICULAR

## 2017-07-09 MED ORDER — LIDOCAINE HCL 1 % IJ SOLN
1.0000 mL | INTRAMUSCULAR | Status: AC | PRN
  Administered 2017-06-27: 1 mL

## 2017-07-25 ENCOUNTER — Encounter (INDEPENDENT_AMBULATORY_CARE_PROVIDER_SITE_OTHER): Payer: Self-pay | Admitting: Orthopaedic Surgery

## 2017-07-25 ENCOUNTER — Ambulatory Visit (INDEPENDENT_AMBULATORY_CARE_PROVIDER_SITE_OTHER): Payer: Managed Care, Other (non HMO) | Admitting: Orthopaedic Surgery

## 2017-07-25 ENCOUNTER — Telehealth (INDEPENDENT_AMBULATORY_CARE_PROVIDER_SITE_OTHER): Payer: Self-pay | Admitting: Radiology

## 2017-07-25 VITALS — BP 170/120

## 2017-07-25 DIAGNOSIS — I10 Essential (primary) hypertension: Secondary | ICD-10-CM | POA: Diagnosis not present

## 2017-07-25 DIAGNOSIS — M1711 Unilateral primary osteoarthritis, right knee: Secondary | ICD-10-CM

## 2017-07-25 NOTE — Telephone Encounter (Signed)
Patient for Synvisc One Approval.

## 2017-07-25 NOTE — Progress Notes (Addendum)
Office Visit Note   Patient: Billy Davis           Date of Birth: March 30, 1956           MRN: [id number] Visit Date: 07/25/2017              Requested by: [name], MD [address] [place], [place] [address] PCP: [name], MD   Assessment & Plan: Visit Diagnoses:  1. Unilateral primary osteoarthritis, right knee   2. Essential hypertension     Plan: We will seek approval for Visco injection right knee.  He will see Dr. Manuella Ghazi about his elevated blood pressure.  He may need to get some insulin that he takes adjusted dose with meals if he has persistent elevation of his A1c.  We will notify him once we get approval and then he can return for aspiration of the knee and injection.  I scan and CT scan from his previous fusion at L5-S1 and also multilevel decompression above the fusion 2 levels which shows adequate decompression.  The scan images was reviewed as well both from spring 2018.  Discussed improving his knee function hopeful response with the Visco injection weight loss hypertension controlled diabetic management which she can discuss with Dr.Shah.  Follow-Up Instructions: No Follow-up on file.   Orders:  No orders of the defined types were placed in this encounter.  No orders of the defined types were placed in this encounter.     Procedures: No procedures performed   Clinical Data: No additional findings.   Subjective: Chief Complaint  Patient presents with  . Right Knee - Pain    HPI 61 year old male returns and had injection in his knee 06/27/2017.  He did well for about a week and then had increased problems we used a half dose of cortisone due to his diabetes with A1c last noted in the chart being 9.  He used to be on Lantus now he is on Levemir at night he does not take insulin just before meals.  Blood pressure elevated today 170/120 which is a concern and we discussed.  His knee has swelled again he is having difficulty bending his knee.  He is ambulatory with  a cane.  He has an old history of fibular avulsion injury years ago to his right knee.  X-rays earlier this month showed moderate osteoarthritis tricompartmental.  His swelling has returned not quite as bad as the beginning of the month.  He has pain with ambulation sometimes catching it bothers him at night.  Anti-inflammatories in the past has been taking Advil for his knee which may be elevating his blood pressure.  Patient states he like to try a Visco injection.  Review of Systems 14 point review of systems updated unchanged from last office visit other than as mentioned in HPI.  Of concern is his elevated blood pressure today taken manually and also his A1c.   Objective: Vital Signs: BP (!) 170/120   Physical Exam  Constitutional: He is oriented to person, place, and time. He appears well-developed and well-nourished.  HENT:  Head: Normocephalic and atraumatic.  Eyes: EOM are normal. Pupils are equal, round, and reactive to light.  Neck: No tracheal deviation present. No thyromegaly present.  Cardiovascular: Normal rate.  Pulmonary/Chest: Effort normal. He has no wheezes.  Abdominal: Soft. Bowel sounds are normal.  Neurological: He is alert and oriented to person, place, and time.  Skin: Skin is warm and dry. Capillary refill takes less than  2 seconds.  Psychiatric: He has a normal mood and affect. His behavior is normal. Judgment and thought content normal.    Ortho Exam patient has 2+ knee effusion.  He has pain with flexion past 110 degrees due to the effusion.  He lacks 10 degrees reaching full extension.  There is crepitus with knee range of motion medial and joint line tenderness.  Collateral ligaments crucial ligament exam is normal.  There is trace lower extremity edema he has palpable pulses.  Is no plantar foot calluses.  He has more numbness in the right leg than the left entire leg and some decreased sensation bilaterally of his feet and ankles.  Specialty Comments:  No  specialty comments available.  Imaging: No results found.   PMFS History: Patient Active Problem List   Diagnosis Date Noted  . Rectal bleeding 06/06/2017  . 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 DM (diabetes mellitus) ([place]) 11/09/2013  . Unspecified diastolic heart failure   . Morbid obesity ([place])   . IBS (irritable bowel syndrome)   . Ejection fraction   . CAD, multiple vessel   . Contrast media allergy   . Aneurysm ([place])   . Preop cardiovascular exam   . Prostate cancer ([place]) 03/24/2012  . Diastolic dysfunction 93/71/6967  . GERD (gastroesophageal reflux disease) 12/27/2010  . Hypertension 12/27/2010  . DYSLIPIDEMIA 12/26/2009  . FATTY LIVER DISEASE 12/26/2009   Past Medical History:  Diagnosis Date  . 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 CAD, multiple vessel    PCI circumflex 2008, diffuse disease of the right and LAD treated medically.,  //   Nuclear 2010 no ischemia   //   nuclear May, 2012 no ischemia  . Cirrhosis of liver without mention of alcohol   . Contrast media allergy    pt states he is not allergy to contrast  . Diabetes mellitus without complication ([place])    type 2  . Diverticulosis   . Dysphagia   . Ejection fraction    EF 55%, nuclear, May, 2012  . Esophageal reflux   . IBS (irritable bowel syndrome)   . Lower abdominal pain   . Mixed dyslipidemia   . Morbid obesity ([place])   . Other chronic nonalcoholic liver disease   . Postsurgical percutaneous transluminal coronary angioplasty status   . Preop cardiovascular exam    Cardiovascular clearance for prostate surgery October, 2013  . Prostate cancer ([place]) 03/24/12  . Unspecified diastolic heart failure   . Unspecified essential hypertension     Family History  Problem Relation Age of Onset  . Heart disease Mother   . Breast cancer Mother   . Heart disease Father   . Breast cancer Sister   . Ovarian  cancer Sister   . Heart disease Brother   . Healthy Daughter   . Healthy Daughter   . Healthy Daughter   . Healthy Daughter   . Coronary artery disease Unknown        family history    Past Surgical History:  Procedure Laterality Date  . BACK SURGERY     took cyst out of back  . CARDIAC CATHETERIZATION    . CARPAL TUNNEL RELEASE Bilateral   . CHOLECYSTECTOMY  06/1998  . COLONOSCOPY  03/03/03  . COLONOSCOPY  08/17/2011   Procedure: COLONOSCOPY;  Surgeon: Rogene Houston, MD;  Location: [facility];  Service: Endoscopy;  Laterality: N/A;  1045  .  COLONOSCOPY N/A 07/05/2017   Procedure: COLONOSCOPY;  Surgeon: Rogene Houston, MD;  Location: [facility];  Service: Endoscopy;  Laterality: N/A;  2:00  . LEFT HEART CATHETERIZATION WITH CORONARY ANGIOGRAM N/A 12/07/2013   Procedure: LEFT HEART CATHETERIZATION WITH CORONARY ANGIOGRAM;  Surgeon: Peter M Martinique, MD;  Location: [facility] CATH LAB;  Service: Cardiovascular;  Laterality: N/A;  . left rotator    . RIGHT KNEE SURGERY    . ROBOT ASSISTED LAPAROSCOPIC RADICAL PROSTATECTOMY  06/26/2012   Procedure: ROBOTIC ASSISTED LAPAROSCOPIC RADICAL PROSTATECTOMY LEVEL 3;  Surgeon: Dutch Gray, MD;  Location: [facility] ORS;  Service: Urology;  Laterality: N/A;      . Two cardiac stents    . UPPER GASTROINTESTINAL ENDOSCOPY  02/21/2011   EGD ED  . UPPER GASTROINTESTINAL ENDOSCOPY  03/03/03   [facility]  . VASECTOMY     Social History   Occupational History  . Occupation: [employer]  Tobacco Use  . Smoking status: Former Smoker    Packs/day: 3.00    Years: 30.00    Pack years: 90.00    Types: Cigarettes    Last attempt to quit: 08/27/1998    Years since quitting: 18.9  . Smokeless tobacco: Never Used  . Tobacco comment: Pt quit smoking 10 yrs ago, smoked for about 30 yrs.  Substance and Sexual Activity  . Alcohol use: No    Alcohol/week: 0.0 oz  . Drug use: No  . Sexual activity: Not on file

## 2017-08-02 ENCOUNTER — Other Ambulatory Visit (INDEPENDENT_AMBULATORY_CARE_PROVIDER_SITE_OTHER): Payer: Self-pay | Admitting: Internal Medicine

## 2017-08-02 NOTE — Telephone Encounter (Signed)
[employer]--sent for prior authorization and verification of benefits. Awaiting response. Call patient to schedule appt for injection in [place], [place] office once approved.

## 2017-08-13 NOTE — Telephone Encounter (Signed)
Please give patient a call back regarding appt for injection. CB # [phone]

## 2017-08-14 NOTE — Telephone Encounter (Signed)
Received documentation from [employer] this morning that Synvisc One injection has been denied. Will attempt Monovisc and see if it will be approved there.

## 2017-08-16 NOTE — Telephone Encounter (Signed)
I called patient and advised. Waiting on authorization from [place]. He would like to have done prior to the beginning of the year as he has met his deductible. I explained that we had to wait for the insurance to approve. I will call as soon as I receive authorization.

## 2017-10-24 ENCOUNTER — Encounter (INDEPENDENT_AMBULATORY_CARE_PROVIDER_SITE_OTHER): Payer: Self-pay | Admitting: Orthopaedic Surgery

## 2017-10-24 ENCOUNTER — Ambulatory Visit (INDEPENDENT_AMBULATORY_CARE_PROVIDER_SITE_OTHER): Payer: Managed Care, Other (non HMO) | Admitting: Orthopaedic Surgery

## 2017-10-24 VITALS — BP 146/79 | HR 67 | Wt 311.0 lb

## 2017-10-24 DIAGNOSIS — M1711 Unilateral primary osteoarthritis, right knee: Secondary | ICD-10-CM | POA: Insufficient documentation

## 2017-10-24 MED ORDER — LIDOCAINE HCL 1 % IJ SOLN
0.5000 mL | INTRAMUSCULAR | Status: AC | PRN
  Administered 2017-10-24: .5 mL

## 2017-10-24 MED ORDER — METHYLPREDNISOLONE ACETATE 40 MG/ML IJ SUSP
40.0000 mg | INTRAMUSCULAR | Status: AC | PRN
  Administered 2017-10-24: 40 mg via INTRA_ARTICULAR

## 2017-10-24 MED ORDER — BUPIVACAINE HCL 0.25 % IJ SOLN
4.0000 mL | INTRAMUSCULAR | Status: AC | PRN
  Administered 2017-10-24: 4 mL via INTRA_ARTICULAR

## 2017-10-24 NOTE — Progress Notes (Signed)
Office Visit Note   Patient: Billy Davis           Date of Birth: 1956/07/19           MRN: [id number] Visit Date: 10/24/2017              Requested by: [name], MD [address] [place], [place] [address] PCP: [name], MD   Assessment & Plan: Visit Diagnoses:  1. Unilateral primary osteoarthritis, right knee     Plan: Knee injection performed after aspiration which gave him significant relief.  I will recheck him in 3 months.  We discussed total knee arthroplasty, right knee.  His BMI is just under 40 and he will continue to work on some weight loss which will help with his postop rehab as well as his diabetes.  He will require cardiac clearance before surgery due to his coronary artery disease.  Return in 3 months and we can discuss total knee arthroplasty further.  Follow-Up Instructions: Return in about 3 months (around 01/21/2018).   Orders:  Orders Placed This Encounter  Procedures  . Large Joint Inj   No orders of the defined types were placed in this encounter.     Procedures: Large Joint Inj: R knee on 10/24/2017 9:25 AM Indications: pain and joint swelling Details: 22 G 1.5 in needle, anterolateral approach  Arthrogram: No  Medications: 40 mg methylPREDNISolone acetate 40 MG/ML; 0.5 mL lidocaine 1 %; 4 mL bupivacaine 0.25 % Aspirate: 40 mL Outcome: tolerated well, no immediate complications Procedure, treatment alternatives, risks and benefits explained, specific risks discussed. Consent was given by the patient. Immediately prior to procedure a time out was called to verify the correct patient, procedure, equipment, support staff and site/side marked as required. Patient was prepped and draped in the usual sterile fashion.       Clinical Data: No additional findings.   Subjective: Chief Complaint  Patient presents with  . Right Knee - Pain    HPI 62 year old male returns with recurrent problems with right knee synovitis.  He said increased pain  and discomfort.  No erythema no chills or fever.  Knee is tight is difficult for him to bend his knee and he is having pain with walking and working.  Changes marginal osteophytes flattening of the condyle and subchondral sclerosis tricompartmental.  He requested repeat injection today as well as aspiration.  Patient is having to walk with a cane.  Review of Systems positive for diabetes on insulin with last A1c 7.6.  Obesity, coronary artery disease, history of aneurysm.  GERD, hypertension, hyperlipidemia.  Fatty liver disease.  Right knee osteoarthritis   Objective: Vital Signs: BP (!) 146/79   Pulse 67   Wt (!) 311 lb (141.1 kg)   BMI 39.93 kg/m   Physical Exam  Constitutional: He is oriented to person, place, and time. He appears well-developed and well-nourished.  HENT:  Head: Normocephalic and atraumatic.  Eyes: EOM are normal. Pupils are equal, round, and reactive to light.  Neck: No tracheal deviation present. No thyromegaly present.  Cardiovascular: Normal rate.  Pulmonary/Chest: Effort normal. He has no wheezes.  Abdominal: Soft. Bowel sounds are normal.  Neurological: He is alert and oriented to person, place, and time.  Skin: Skin is warm and dry. Capillary refill takes less than 2 seconds.  Psychiatric: He has a normal mood and affect. His behavior is normal. Judgment and thought content normal.    Ortho Exam negative logroll hips.  3-4+ effusion right  knee no increased warmth tenderness both joint lines palpable osteophytes collateral ligaments are stable.  Distal pulses palpable negative calf tenderness.  Ankle dorsiflexion plantarflexion is intact.  Opposite knee shows no effusion.  ACL PCL exam is normal.  Specialty Comments:  No specialty comments available.  Imaging: No results found.   PMFS History: Patient Active Problem List   Diagnosis Date Noted  . Rectal bleeding 06/06/2017  . 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 DM  (diabetes mellitus) ([place]) 11/09/2013  . Unspecified diastolic heart failure   . Morbid obesity ([place])   . IBS (irritable bowel syndrome)   . Ejection fraction   . CAD, multiple vessel   . Contrast media allergy   . Aneurysm ([place])   . Preop cardiovascular exam   . Prostate cancer ([place]) 03/24/2012  . Diastolic dysfunction 62/13/0865  . GERD (gastroesophageal reflux disease) 12/27/2010  . Hypertension 12/27/2010  . DYSLIPIDEMIA 12/26/2009  . FATTY LIVER DISEASE 12/26/2009   Past Medical History:  Diagnosis Date  . Aneurysm ([place])    right femoral pseudoaneurysm, thrombosed (following 03/20/07 cardiac cath)  . CAD, multiple vessel    PCI circumflex 2008, diffuse disease of the right and LAD treated medically.,  //   Nuclear 2010 no ischemia   //   nuclear May, 2012 no ischemia  . Cirrhosis of liver without mention of alcohol   . Contrast media allergy    pt states he is not allergy to contrast  . Diabetes mellitus without complication ([place])    type 2  . Diverticulosis   . Dysphagia   . Ejection fraction    EF 55%, nuclear, May, 2012  . Esophageal reflux   . IBS (irritable bowel syndrome)   . Lower abdominal pain   . Mixed dyslipidemia   . Morbid obesity ([place])   . Other chronic nonalcoholic liver disease   . Postsurgical percutaneous transluminal coronary angioplasty status   . Preop cardiovascular exam    Cardiovascular clearance for prostate surgery October, 2013  . Prostate cancer ([place]) 03/24/12  . Unspecified diastolic heart failure   . Unspecified essential hypertension     Family History  Problem Relation Age of Onset  . Heart disease Mother   . Breast cancer Mother   . Heart disease Father   . Breast cancer Sister   . Ovarian cancer Sister   . Heart disease Brother   . Healthy Daughter   . Healthy Daughter   . Healthy Daughter   . Healthy Daughter   . Coronary artery disease Unknown        family history    Past Surgical History:  Procedure Laterality Date    . BACK SURGERY     took cyst out of back  . CARDIAC CATHETERIZATION    . CARPAL TUNNEL RELEASE Bilateral   . CHOLECYSTECTOMY  06/1998  . COLONOSCOPY  03/03/03  . COLONOSCOPY  08/17/2011   Procedure: COLONOSCOPY;  Surgeon: Rogene Houston, MD;  Location: [facility];  Service: Endoscopy;  Laterality: N/A;  1045  . COLONOSCOPY N/A 07/05/2017   Procedure: COLONOSCOPY;  Surgeon: Rogene Houston, MD;  Location: [facility];  Service: Endoscopy;  Laterality: N/A;  2:00  . LEFT HEART CATHETERIZATION WITH CORONARY ANGIOGRAM N/A 12/07/2013   Procedure: LEFT HEART CATHETERIZATION WITH CORONARY ANGIOGRAM;  Surgeon: Peter M Martinique, MD;  Location: [facility] CATH LAB;  Service: Cardiovascular;  Laterality: N/A;  . left rotator    . RIGHT KNEE SURGERY    .  ROBOT ASSISTED LAPAROSCOPIC RADICAL PROSTATECTOMY  06/26/2012   Procedure: ROBOTIC ASSISTED LAPAROSCOPIC RADICAL PROSTATECTOMY LEVEL 3;  Surgeon: Dutch Gray, MD;  Location: [facility] ORS;  Service: Urology;  Laterality: N/A;      . Two cardiac stents    . UPPER GASTROINTESTINAL ENDOSCOPY  02/21/2011   EGD ED  . UPPER GASTROINTESTINAL ENDOSCOPY  03/03/03   TCS  . VASECTOMY     Social History   Occupational History  . Occupation: Industry  Tobacco Use  . Smoking status: Former Smoker    Packs/day: 3.00    Years: 30.00    Pack years: 90.00    Types: Cigarettes    Last attempt to quit: 08/27/1998    Years since quitting: 19.1  . Smokeless tobacco: Never Used  . Tobacco comment: Pt quit smoking 10 yrs ago, smoked for about 30 yrs.  Substance and Sexual Activity  . Alcohol use: No    Alcohol/week: 0.0 oz  . Drug use: No  . Sexual activity: Not on file

## 2017-10-29 ENCOUNTER — Other Ambulatory Visit (INDEPENDENT_AMBULATORY_CARE_PROVIDER_SITE_OTHER): Payer: Self-pay | Admitting: *Deleted

## 2017-10-29 ENCOUNTER — Encounter (INDEPENDENT_AMBULATORY_CARE_PROVIDER_SITE_OTHER): Payer: Self-pay | Admitting: *Deleted

## 2017-10-29 ENCOUNTER — Ambulatory Visit (INDEPENDENT_AMBULATORY_CARE_PROVIDER_SITE_OTHER): Payer: Managed Care, Other (non HMO) | Admitting: Internal Medicine

## 2017-10-29 ENCOUNTER — Encounter (INDEPENDENT_AMBULATORY_CARE_PROVIDER_SITE_OTHER): Payer: Self-pay | Admitting: Internal Medicine

## 2017-10-29 ENCOUNTER — Other Ambulatory Visit (INDEPENDENT_AMBULATORY_CARE_PROVIDER_SITE_OTHER): Payer: Self-pay | Admitting: Internal Medicine

## 2017-10-29 VITALS — BP 122/80 | HR 74 | Temp 98.1°F | Resp 18 | Ht 74.0 in | Wt 315.6 lb

## 2017-10-29 DIAGNOSIS — K219 Gastro-esophageal reflux disease without esophagitis: Secondary | ICD-10-CM

## 2017-10-29 DIAGNOSIS — K7460 Unspecified cirrhosis of liver: Secondary | ICD-10-CM

## 2017-10-29 DIAGNOSIS — K7581 Nonalcoholic steatohepatitis (NASH): Secondary | ICD-10-CM

## 2017-10-29 DIAGNOSIS — K580 Irritable bowel syndrome with diarrhea: Secondary | ICD-10-CM

## 2017-10-29 DIAGNOSIS — K7469 Other cirrhosis of liver: Secondary | ICD-10-CM

## 2017-10-29 NOTE — Patient Instructions (Addendum)
Esophagogastroduodenoscopy and [place] to be scheduled. Physician will call with results of blood work and ultrasound when completed.

## 2017-10-29 NOTE — Progress Notes (Signed)
Presenting complaint;  Follow-up for chronic liver disease GERD and IBS.  Database and subjective:  Patient is 62 year old Caucasian male who has over 20-year history of cirrhosis secondary to NASH with well preserved hepatic function, chronic GERD and IBS who is here for scheduled visit. He was last seen on 06/05/2017 when he weighed 318 pounds.  He has lost 23 pounds since his last visit. He states he is not been able to exercise or walk because of constant right knee pain.  He says he had 40 mL of fluid removed and may have had a intra-articular injection of medication but does not know the details.  He is trying to postpone knee replacement for as long as he could.  He feels heartburn is well controlled with medications.  He denies dysphagia nausea or vomiting.  He has 2-4 stools per day.  First stool is always formed and subsequent stools are loose.  He denies accidents or nocturnal bowel movements.  He takes Advil only once in a while and has not used nitroglycerin in several months. Last EGD was in June 2012 and was negative for esophageal varices or PHG.  He had few antral telangiectasia.    Current Medications: Outpatient Encounter Medications as of 10/29/2017  Medication Sig  . amLODipine (NORVASC) 5 MG tablet Take 5 mg by mouth daily.  Marland Kitchen aspirin EC 81 MG tablet Take 81 mg by mouth daily.  . chlorthalidone (HYGROTON) 25 MG tablet TAKE 1 TABLET DAILY  . dicyclomine (BENTYL) 10 MG capsule TAKE 1 CAPSULE DAILY  . ibuprofen (ADVIL,MOTRIN) 200 MG tablet Take 600 mg by mouth daily.  . insulin glargine (LANTUS) 100 unit/mL SOPN Inject 76 Units into the skin at bedtime.  Marland Kitchen lisinopril (PRINIVIL,ZESTRIL) 40 MG tablet Take 40 mg by mouth every morning.   . metoprolol (LOPRESSOR) 50 MG tablet Take 50 mg by mouth daily.  . Multiple Vitamin (MULTI VITAMIN MENS PO) Take 1 tablet by mouth daily.   Marland Kitchen NITROSTAT 0.4 MG SL tablet Place 1 tablet under the tongue every 5 (five) minutes as needed. For  chest pains  . pantoprazole (PROTONIX) 40 MG tablet Take 40 mg by mouth daily.  . [DISCONTINUED] diazepam (VALIUM) 2 MG tablet Take 2 mg by mouth every 6 (six) hours as needed for anxiety or muscle spasms.  . [DISCONTINUED] diclofenac (VOLTAREN) 75 MG EC tablet Take 75 mg by mouth daily as needed for moderate pain.  . [DISCONTINUED] gabapentin (NEURONTIN) 300 MG capsule Take 300 mg by mouth 2 (two) times daily as needed (neuropathic pain).  . [DISCONTINUED] insulin degludec (TRESIBA FLEXTOUCH) 100 UNIT/ML SOPN FlexTouch Pen Inject 76 Units into the skin daily at 10 pm.   No facility-administered encounter medications on file as of 10/29/2017.      Objective: Blood pressure 122/80, pulse 74, temperature 98.1 F (36.7 C), temperature source Oral, resp. rate 18, height 6' 2"  (1.88 m), weight (!) 315 lb 9.6 oz (143.2 kg). Patient is alert and in no acute distress. He does not have asterixis. Conjunctiva is pink. Sclera is nonicteric Oropharyngeal mucosa is normal. No neck masses or thyromegaly noted. Cardiac exam with regular rhythm normal S1 and S2. No murmur or gallop noted. Lungs are clear to auscultation. Abdomenis obese but soft with mild midepigastric tenderness.  Liver and spleen not palpable. No LE edema or clubbing noted.  Labs/studies Results: Lab data from 06/05/2017 WBC 7.8, H&H 14.8 and 44.6 and platelet count 141K. Bilirubin 0.5, AP 76, AST 2 6, ALT 3 2,  total protein 7.2 and albumin 4.1.  AFP was 2.4 on 11/08/2016. He had MR in in April 2018 primarily for renal lesions but no abnormalities were noted in cirrhotic liver.  Ultrasound in April last year suggested splenomegaly.   Assessment:  #1.  Cirrhosis secondary to NASH.  His hepatic function is well preserved.  He is due for [facility] screening.  He has not been screened for esophageal varices in over 6 years.  He does have splenomegaly but he does not have thrombocytopenia.  Nevertheless he needs to be screened for esophageal  varices and and banding if appropriate.  #2.  Chronic GERD.  He is doing well with therapy.   #3.  Irritable bowel syndrome.  He is doing well with low-dose antispasmodic.  Last colonoscopy was in November 2018 revealing sigmoid and cecal diverticula and internal hemorrhoids.   Plan:  Patient will go to the lab for CBC, metabolic 7 and AFP. Abdominal ultrasound for [place] screening. Esophagogastroduodenoscopy to screen and treat esophageal varices under monitored anesthesia care. Office visit in 6 months.

## 2017-10-30 LAB — CBC
HEMATOCRIT: 45.1 % (ref 38.5–50.0)
HEMOGLOBIN: 15.3 g/dL (ref 13.2–17.1)
MCH: 26.9 pg — ABNORMAL LOW (ref 27.0–33.0)
MCHC: 33.9 g/dL (ref 32.0–36.0)
MCV: 79.4 fL — ABNORMAL LOW (ref 80.0–100.0)
MPV: 11.5 fL (ref 7.5–12.5)
Platelets: 156 10*3/uL (ref 140–400)
RBC: 5.68 10*6/uL (ref 4.20–5.80)
RDW: 14 % (ref 11.0–15.0)
WBC: 7.8 10*3/uL (ref 3.8–10.8)

## 2017-10-30 LAB — BASIC METABOLIC PANEL
BUN: 14 mg/dL (ref 7–25)
CALCIUM: 9.7 mg/dL (ref 8.6–10.3)
CHLORIDE: 101 mmol/L (ref 98–110)
CO2: 31 mmol/L (ref 20–32)
CREATININE: 0.86 mg/dL (ref 0.70–1.25)
Glucose, Bld: 156 mg/dL — ABNORMAL HIGH (ref 65–139)
Potassium: 3.8 mmol/L (ref 3.5–5.3)
Sodium: 140 mmol/L (ref 135–146)

## 2017-10-30 LAB — AFP TUMOR MARKER: AFP-Tumor Marker: 2.5 ng/mL (ref <6.1)

## 2017-11-07 ENCOUNTER — Ambulatory Visit (HOSPITAL_COMMUNITY): Admission: RE | Admit: 2017-11-07 | Payer: Self-pay | Source: Ambulatory Visit

## 2017-11-26 ENCOUNTER — Ambulatory Visit (INDEPENDENT_AMBULATORY_CARE_PROVIDER_SITE_OTHER): Payer: Managed Care, Other (non HMO) | Admitting: Internal Medicine

## 2017-12-12 NOTE — Patient Instructions (Signed)
Billy Davis  12/12/2017     @PREFPERIOPPHARMACY @   Your procedure is scheduled on  12/20/2017   Report to [facility] at  910   A.M.  Call this number if you have problems the morning of surgery:  [phone]   Remember:  Do not eat food or drink liquids after midnight.  Take these medicines the morning of surgery with A SIP OF WATER  Amlodipine, bentyl, lisinopril, metoprolol, protonix. Take 38 units of Lantus the night before your surgery. DO NOT take any medication for diabetes the morning of your procedure.   Do not wear jewelry, make-up or nail polish.  Do not wear lotions, powders, or perfumes, or deodorant.  Do not shave 48 hours prior to surgery.  Men may shave face and neck.  Do not bring valuables to the hospital.  [facility] is not responsible for any belongings or valuables.  Contacts, dentures or bridgework may not be worn into surgery.  Leave your suitcase in the car.  After surgery it may be brought to your room.  For patients admitted to the hospital, discharge time will be determined by your treatment team.  Patients discharged the day of surgery will not be allowed to drive home.   Name and phone number of your driver:   family Special instructions:  Follow the diet instructions given to you by Dr Olevia Perches office.  Please read over the following fact sheets that you were given. Anesthesia Post-op Instructions and Care and Recovery After Surgery       Esophagogastroduodenoscopy Esophagogastroduodenoscopy (EGD) is a procedure to examine the lining of the esophagus, stomach, and first part of the small intestine (duodenum). This procedure is done to check for problems such as inflammation, bleeding, ulcers, or growths. During this procedure, a long, flexible, lighted tube with a camera attached (endoscope) is inserted down the throat. Tell a health care provider about:  Any allergies you have.  All medicines you are taking, including  vitamins, herbs, eye drops, creams, and over-the-counter medicines.  Any problems you or family members have had with anesthetic medicines.  Any blood disorders you have.  Any surgeries you have had.  Any medical conditions you have.  Whether you are pregnant or may be pregnant. What are the risks? Generally, this is a safe procedure. However, problems may occur, including:  Infection.  Bleeding.  A tear (perforation) in the esophagus, stomach, or duodenum.  Trouble breathing.  Excessive sweating.  Spasms of the larynx.  A slowed heartbeat.  Low blood pressure.  What happens before the procedure?  Follow instructions from your health care provider about eating or drinking restrictions.  Ask your health care provider about: ? Changing or stopping your regular medicines. This is especially important if you are taking diabetes medicines or blood thinners. ? Taking medicines such as aspirin and ibuprofen. These medicines can thin your blood. Do not take these medicines before your procedure if your health care provider instructs you not to.  Plan to have someone take you home after the procedure.  If you wear dentures, be ready to remove them before the procedure. What happens during the procedure?  To reduce your risk of infection, your health care team will wash or sanitize their hands.  An IV tube will be put in a vein in your hand or arm. You will get medicines and fluids through this tube.  You will be given one or more of the following: ?  A medicine to help you relax (sedative). ? A medicine to numb the area (local anesthetic). This medicine may be sprayed into your throat. It will make you feel more comfortable and keep you from gagging or coughing during the procedure. ? A medicine for pain.  A mouth guard may be placed in your mouth to protect your teeth and to keep you from biting on the endoscope.  You will be asked to lie on your left side.  The  endoscope will be lowered down your throat into your esophagus, stomach, and duodenum.  Air will be put into the endoscope. This will help your health care provider see better.  The lining of your esophagus, stomach, and duodenum will be examined.  Your health care provider may: ? Take a tissue sample so it can be looked at in a lab (biopsy). ? Remove growths. ? Remove objects (foreign bodies) that are stuck. ? Treat any bleeding with medicines or other devices that stop tissue from bleeding. ? Widen (dilate) or stretch narrowed areas of your esophagus and stomach.  The endoscope will be taken out. The procedure may vary among health care providers and hospitals. What happens after the procedure?  Your blood pressure, heart rate, breathing rate, and blood oxygen level will be monitored often until the medicines you were given have worn off.  Do not eat or drink anything until the numbing medicine has worn off and your gag reflex has returned. This information is not intended to replace advice given to you by your health care provider. Make sure you discuss any questions you have with your health care provider. Document Released: 12/14/2004 Document Revised: 01/19/2016 Document Reviewed: 07/07/2015 Elsevier Interactive Patient Education  2018 [employer]. Esophagogastroduodenoscopy, Care After Refer to this sheet in the next few weeks. These instructions provide you with information about caring for yourself after your procedure. Your health care provider may also give you more specific instructions. Your treatment has been planned according to current medical practices, but problems sometimes occur. Call your health care provider if you have any problems or questions after your procedure. What can I expect after the procedure? After the procedure, it is common to have:  A sore throat.  Nausea.  Bloating.  Dizziness.  Fatigue.  Follow these instructions at home:  Do not eat  or drink anything until the numbing medicine (local anesthetic) has worn off and your gag reflex has returned. You will know that the local anesthetic has worn off when you can swallow comfortably.  Do not drive for 24 hours if you received a medicine to help you relax (sedative).  If your health care provider took a tissue sample for testing during the procedure, make sure to get your test results. This is your responsibility. Ask your health care provider or the department performing the test when your results will be ready.  Keep all follow-up visits as told by your health care provider. This is important. Contact a health care provider if:  You cannot stop coughing.  You are not urinating.  You are urinating less than usual. Get help right away if:  You have trouble swallowing.  You cannot eat or drink.  You have throat or chest pain that gets worse.  You are dizzy or light-headed.  You faint.  You have nausea or vomiting.  You have chills.  You have a fever.  You have severe abdominal pain.  You have black, tarry, or bloody stools. This information is not intended to  replace advice given to you by your health care provider. Make sure you discuss any questions you have with your health care provider. Document Released: 07/30/2012 Document Revised: 01/19/2016 Document Reviewed: 07/07/2015 Elsevier Interactive Patient Education  2018 [place] Anesthesia is a term that refers to techniques, procedures, and medicines that help a person stay safe and comfortable during a medical procedure. Monitored anesthesia care, or sedation, is one type of anesthesia. Your anesthesia specialist may recommend sedation if you will be having a procedure that does not require you to be unconscious, such as:  Cataract surgery.  A dental procedure.  A biopsy.  A colonoscopy.  During the procedure, you may receive a medicine to help you relax (sedative).  There are three levels of sedation:  Mild sedation. At this level, you may feel awake and relaxed. You will be able to follow directions.  Moderate sedation. At this level, you will be sleepy. You may not remember the procedure.  Deep sedation. At this level, you will be asleep. You will not remember the procedure.  The more medicine you are given, the deeper your level of sedation will be. Depending on how you respond to the procedure, the anesthesia specialist may change your level of sedation or the type of anesthesia to fit your needs. An anesthesia specialist will monitor you closely during the procedure. Let your health care provider know about:  Any allergies you have.  All medicines you are taking, including vitamins, herbs, eye drops, creams, and over-the-counter medicines.  Any use of steroids (by mouth or as a cream).  Any problems you or family members have had with sedatives and anesthetic medicines.  Any blood disorders you have.  Any surgeries you have had.  Any medical conditions you have, such as sleep apnea.  Whether you are pregnant or may be pregnant.  Any use of cigarettes, alcohol, or street drugs. What are the risks? Generally, this is a safe procedure. However, problems may occur, including:  Getting too much medicine (oversedation).  Nausea.  Allergic reaction to medicines.  Trouble breathing. If this happens, a breathing tube may be used to help with breathing. It will be removed when you are awake and breathing on your own.  Heart trouble.  Lung trouble.  Before the procedure Staying hydrated Follow instructions from your health care provider about hydration, which may include:  Up to 2 hours before the procedure - you may continue to drink clear liquids, such as water, clear fruit juice, black coffee, and plain tea.  Eating and drinking restrictions Follow instructions from your health care provider about eating and drinking, which may  include:  8 hours before the procedure - stop eating heavy meals or foods such as meat, fried foods, or fatty foods.  6 hours before the procedure - stop eating light meals or foods, such as toast or cereal.  6 hours before the procedure - stop drinking milk or drinks that contain milk.  2 hours before the procedure - stop drinking clear liquids.  Medicines Ask your health care provider about:  Changing or stopping your regular medicines. This is especially important if you are taking diabetes medicines or blood thinners.  Taking medicines such as aspirin and ibuprofen. These medicines can thin your blood. Do not take these medicines before your procedure if your health care provider instructs you not to.  Tests and exams  You will have a physical exam.  You may have blood tests done to show: ?  How well your kidneys and liver are working. ? How well your blood can clot.  General instructions  Plan to have someone take you home from the hospital or clinic.  If you will be going home right after the procedure, plan to have someone with you for 24 hours.  What happens during the procedure?  Your blood pressure, heart rate, breathing, level of pain and overall condition will be monitored.  An IV tube will be inserted into one of your veins.  Your anesthesia specialist will give you medicines as needed to keep you comfortable during the procedure. This may mean changing the level of sedation.  The procedure will be performed. After the procedure  Your blood pressure, heart rate, breathing rate, and blood oxygen level will be monitored until the medicines you were given have worn off.  Do not drive for 24 hours if you received a sedative.  You may: ? Feel sleepy, clumsy, or nauseous. ? Feel forgetful about what happened after the procedure. ? Have a sore throat if you had a breathing tube during the procedure. ? Vomit. This information is not intended to replace advice  given to you by your health care provider. Make sure you discuss any questions you have with your health care provider. Document Released: 05/09/2005 Document Revised: 01/20/2016 Document Reviewed: 12/04/2015 Elsevier Interactive Patient Education  2018 [place], Care After These instructions provide you with information about caring for yourself after your procedure. Your health care provider may also give you more specific instructions. Your treatment has been planned according to current medical practices, but problems sometimes occur. Call your health care provider if you have any problems or questions after your procedure. What can I expect after the procedure? After your procedure, it is common to:  Feel sleepy for several hours.  Feel clumsy and have poor balance for several hours.  Feel forgetful about what happened after the procedure.  Have poor judgment for several hours.  Feel nauseous or vomit.  Have a sore throat if you had a breathing tube during the procedure.  Follow these instructions at home: For at least 24 hours after the procedure:   Do not: ? Participate in activities in which you could fall or become injured. ? Drive. ? Use heavy machinery. ? Drink alcohol. ? Take sleeping pills or medicines that cause drowsiness. ? Make important decisions or sign legal documents. ? Take care of children on your own.  Rest. Eating and drinking  Follow the diet that is recommended by your health care provider.  If you vomit, drink water, juice, or soup when you can drink without vomiting.  Make sure you have little or no nausea before eating solid foods. General instructions  Have a responsible adult stay with you until you are awake and alert.  Take over-the-counter and prescription medicines only as told by your health care provider.  If you smoke, do not smoke without supervision.  Keep all follow-up visits as told by your  health care provider. This is important. Contact a health care provider if:  You keep feeling nauseous or you keep vomiting.  You feel light-headed.  You develop a rash.  You have a fever. Get help right away if:  You have trouble breathing. This information is not intended to replace advice given to you by your health care provider. Make sure you discuss any questions you have with your health care provider. Document Released: 12/04/2015 Document Revised: 04/04/2016 Document Reviewed: 12/04/2015  Chartered certified accountant Patient Education  [employer].

## 2017-12-16 ENCOUNTER — Other Ambulatory Visit: Payer: Self-pay

## 2017-12-16 ENCOUNTER — Other Ambulatory Visit (INDEPENDENT_AMBULATORY_CARE_PROVIDER_SITE_OTHER): Payer: Self-pay | Admitting: Internal Medicine

## 2017-12-16 ENCOUNTER — Encounter (HOSPITAL_COMMUNITY): Payer: Self-pay

## 2017-12-16 ENCOUNTER — Encounter (HOSPITAL_COMMUNITY)
Admission: RE | Admit: 2017-12-16 | Discharge: 2017-12-16 | Disposition: A | Discharge status: Home / Self Care | Payer: BLUE CROSS/BLUE SHIELD | Source: Ambulatory Visit | Attending: Internal Medicine | Admitting: Internal Medicine

## 2017-12-16 DIAGNOSIS — Z6841 Body Mass Index (BMI) 40.0 and over, adult: Secondary | ICD-10-CM | POA: Diagnosis not present

## 2017-12-16 DIAGNOSIS — K219 Gastro-esophageal reflux disease without esophagitis: Secondary | ICD-10-CM | POA: Diagnosis not present

## 2017-12-16 DIAGNOSIS — Z01812 Encounter for preprocedural laboratory examination: Secondary | ICD-10-CM | POA: Insufficient documentation

## 2017-12-16 DIAGNOSIS — E119 Type 2 diabetes mellitus without complications: Secondary | ICD-10-CM | POA: Insufficient documentation

## 2017-12-16 DIAGNOSIS — K7460 Unspecified cirrhosis of liver: Secondary | ICD-10-CM | POA: Insufficient documentation

## 2017-12-16 DIAGNOSIS — Z01810 Encounter for preprocedural cardiovascular examination: Secondary | ICD-10-CM | POA: Diagnosis present

## 2017-12-16 LAB — CBC WITH DIFFERENTIAL/PLATELET
BASOS ABS: 0.1 10*3/uL (ref 0.0–0.1)
BASOS PCT: 1 %
EOS ABS: 0.2 10*3/uL (ref 0.0–0.7)
EOS PCT: 2 %
HCT: 45.3 % (ref 39.0–52.0)
Hemoglobin: 15.1 g/dL (ref 13.0–17.0)
Lymphocytes Relative: 22 %
Lymphs Abs: 2 10*3/uL (ref 0.7–4.0)
MCH: 27.3 pg (ref 26.0–34.0)
MCHC: 33.3 g/dL (ref 30.0–36.0)
MCV: 81.8 fL (ref 78.0–100.0)
Monocytes Absolute: 0.5 10*3/uL (ref 0.1–1.0)
Monocytes Relative: 5 %
Neutro Abs: 6.2 10*3/uL (ref 1.7–7.7)
Neutrophils Relative %: 70 %
PLATELETS: 143 10*3/uL — AB (ref 150–400)
RBC: 5.54 MIL/uL (ref 4.22–5.81)
RDW: 13.9 % (ref 11.5–15.5)
WBC: 8.9 10*3/uL (ref 4.0–10.5)

## 2017-12-16 LAB — BASIC METABOLIC PANEL
ANION GAP: 9 (ref 5–15)
BUN: 18 mg/dL (ref 6–20)
CALCIUM: 9.6 mg/dL (ref 8.9–10.3)
CO2: 26 mmol/L (ref 22–32)
Chloride: 101 mmol/L (ref 101–111)
Creatinine, Ser: 0.8 mg/dL (ref 0.61–1.24)
Glucose, Bld: 183 mg/dL — ABNORMAL HIGH (ref 65–99)
POTASSIUM: 3.4 mmol/L — AB (ref 3.5–5.1)
SODIUM: 136 mmol/L (ref 135–145)

## 2017-12-16 MED ORDER — POTASSIUM CHLORIDE CRYS ER 20 MEQ PO TBCR: 20.0000 meq | EXTENDED_RELEASE_TABLET | Freq: Every day | ORAL | 2 refills | Status: DC

## 2017-12-20 ENCOUNTER — Ambulatory Visit (HOSPITAL_COMMUNITY): Payer: BLUE CROSS/BLUE SHIELD | Admitting: Anesthesiology

## 2017-12-20 ENCOUNTER — Encounter (HOSPITAL_COMMUNITY): Payer: Self-pay

## 2017-12-20 ENCOUNTER — Other Ambulatory Visit: Payer: Self-pay

## 2017-12-20 ENCOUNTER — Ambulatory Visit (HOSPITAL_COMMUNITY)
Admission: RE | Admit: 2017-12-20 | Discharge: 2017-12-20 | Disposition: A | Discharge status: Home / Self Care | Payer: BLUE CROSS/BLUE SHIELD | Source: Ambulatory Visit | Attending: Internal Medicine | Admitting: Internal Medicine

## 2017-12-20 ENCOUNTER — Encounter (HOSPITAL_COMMUNITY)
Admission: RE | Disposition: A | Discharge status: Home / Self Care | Payer: Self-pay | Source: Ambulatory Visit | Attending: Internal Medicine

## 2017-12-20 DIAGNOSIS — Z885 Allergy status to narcotic agent status: Secondary | ICD-10-CM | POA: Insufficient documentation

## 2017-12-20 DIAGNOSIS — Z955 Presence of coronary angioplasty implant and graft: Secondary | ICD-10-CM | POA: Diagnosis not present

## 2017-12-20 DIAGNOSIS — I5032 Chronic diastolic (congestive) heart failure: Secondary | ICD-10-CM | POA: Diagnosis not present

## 2017-12-20 DIAGNOSIS — Z13810 Encounter for screening for upper gastrointestinal disorder: Secondary | ICD-10-CM | POA: Diagnosis not present

## 2017-12-20 DIAGNOSIS — E119 Type 2 diabetes mellitus without complications: Secondary | ICD-10-CM | POA: Diagnosis not present

## 2017-12-20 DIAGNOSIS — I2510 Atherosclerotic heart disease of native coronary artery without angina pectoris: Secondary | ICD-10-CM | POA: Diagnosis not present

## 2017-12-20 DIAGNOSIS — K7581 Nonalcoholic steatohepatitis (NASH): Secondary | ICD-10-CM | POA: Diagnosis not present

## 2017-12-20 DIAGNOSIS — Z87891 Personal history of nicotine dependence: Secondary | ICD-10-CM | POA: Insufficient documentation

## 2017-12-20 DIAGNOSIS — K317 Polyp of stomach and duodenum: Secondary | ICD-10-CM | POA: Insufficient documentation

## 2017-12-20 DIAGNOSIS — E782 Mixed hyperlipidemia: Secondary | ICD-10-CM | POA: Diagnosis not present

## 2017-12-20 DIAGNOSIS — Z7982 Long term (current) use of aspirin: Secondary | ICD-10-CM | POA: Diagnosis not present

## 2017-12-20 DIAGNOSIS — K589 Irritable bowel syndrome without diarrhea: Secondary | ICD-10-CM | POA: Diagnosis not present

## 2017-12-20 DIAGNOSIS — K766 Portal hypertension: Secondary | ICD-10-CM | POA: Insufficient documentation

## 2017-12-20 DIAGNOSIS — Z8546 Personal history of malignant neoplasm of prostate: Secondary | ICD-10-CM | POA: Diagnosis not present

## 2017-12-20 DIAGNOSIS — Z79899 Other long term (current) drug therapy: Secondary | ICD-10-CM | POA: Insufficient documentation

## 2017-12-20 DIAGNOSIS — K7460 Unspecified cirrhosis of liver: Secondary | ICD-10-CM | POA: Insufficient documentation

## 2017-12-20 DIAGNOSIS — K7469 Other cirrhosis of liver: Secondary | ICD-10-CM

## 2017-12-20 DIAGNOSIS — Z794 Long term (current) use of insulin: Secondary | ICD-10-CM | POA: Diagnosis not present

## 2017-12-20 DIAGNOSIS — I110 Hypertensive heart disease with heart failure: Secondary | ICD-10-CM | POA: Diagnosis not present

## 2017-12-20 DIAGNOSIS — K228 Other specified diseases of esophagus: Secondary | ICD-10-CM | POA: Diagnosis not present

## 2017-12-20 DIAGNOSIS — K3189 Other diseases of stomach and duodenum: Secondary | ICD-10-CM | POA: Insufficient documentation

## 2017-12-20 DIAGNOSIS — K219 Gastro-esophageal reflux disease without esophagitis: Secondary | ICD-10-CM | POA: Diagnosis not present

## 2017-12-20 DIAGNOSIS — I8510 Secondary esophageal varices without bleeding: Secondary | ICD-10-CM | POA: Diagnosis not present

## 2017-12-20 HISTORY — PX: ESOPHAGOGASTRODUODENOSCOPY (EGD) WITH PROPOFOL: SHX5813

## 2017-12-20 HISTORY — PX: POLYPECTOMY: SHX5525

## 2017-12-20 LAB — GLUCOSE, CAPILLARY: GLUCOSE-CAPILLARY: 139 mg/dL — AB (ref 65–99)

## 2017-12-20 SURGERY — ESOPHAGOGASTRODUODENOSCOPY (EGD) WITH PROPOFOL
Anesthesia: General

## 2017-12-20 MED ORDER — PROPOFOL 500 MG/50ML IV EMUL
INTRAVENOUS | Status: DC | PRN
  Administered 2017-12-20: 75 ug/kg/min via INTRAVENOUS
  Administered 2017-12-20: 100 ug/kg/min via INTRAVENOUS

## 2017-12-20 MED ORDER — LACTATED RINGERS IV SOLN
INTRAVENOUS | Status: DC | PRN
  Administered 2017-12-20: 09:00:00 via INTRAVENOUS

## 2017-12-20 MED ORDER — LIDOCAINE VISCOUS 2 % MT SOLN
OROMUCOSAL | Status: AC
  Filled 2017-12-20: qty 15

## 2017-12-20 MED ORDER — HYDROCODONE-ACETAMINOPHEN 7.5-325 MG PO TABS: 1.0000 | ORAL_TABLET | Freq: Once | ORAL | Status: DC | PRN

## 2017-12-20 MED ORDER — MEPERIDINE HCL 100 MG/ML IJ SOLN: 6.2500 mg | INTRAMUSCULAR | Status: DC | PRN

## 2017-12-20 MED ORDER — KETOROLAC TROMETHAMINE 30 MG/ML IJ SOLN: 30.0000 mg | Freq: Once | INTRAMUSCULAR | Status: DC | PRN

## 2017-12-20 MED ORDER — PROPOFOL 10 MG/ML IV BOLUS
INTRAVENOUS | Status: DC | PRN
  Administered 2017-12-20 (×2): 20 mg via INTRAVENOUS

## 2017-12-20 MED ORDER — HYDROMORPHONE HCL 1 MG/ML IJ SOLN: 0.2500 mg | INTRAMUSCULAR | Status: DC | PRN

## 2017-12-20 MED ORDER — STERILE WATER FOR IRRIGATION IR SOLN
Status: DC | PRN
  Administered 2017-12-20: 15 mL

## 2017-12-20 MED ORDER — ONDANSETRON HCL 4 MG/2ML IJ SOLN: 4.0000 mg | Freq: Once | INTRAMUSCULAR | Status: DC | PRN

## 2017-12-20 MED ORDER — LIDOCAINE VISCOUS 2 % MT SOLN
OROMUCOSAL | Status: DC | PRN
  Administered 2017-12-20: 1 via OROMUCOSAL

## 2017-12-20 NOTE — Anesthesia Postprocedure Evaluation (Signed)
Anesthesia Post Note  Patient: Billy Davis  Procedure(s) Performed: ESOPHAGOGASTRODUODENOSCOPY (EGD) WITH PROPOFOL (N/A ) POLYPECTOMY  Patient location during evaluation: PACU Anesthesia Type: General Level of consciousness: awake and alert and patient cooperative Pain management: satisfactory to patient Vital Signs Assessment: post-procedure vital signs reviewed and stable Respiratory status: spontaneous breathing Cardiovascular status: stable Postop Assessment: no apparent nausea or vomiting Anesthetic complications: no     Last Vitals:  Vitals:   12/20/17 0840 12/20/17 1045  BP: 140/80 127/72  Pulse: (!) 59 (!) 59  Resp:  (!) 23  Temp: 36.9 C (P) 36.6 C  SpO2: 94% 96%    Last Pain:  Vitals:   12/20/17 1010  TempSrc:   PainSc: 0-No pain                 Gianella Chismar

## 2017-12-20 NOTE — Discharge Instructions (Signed)
No aspirin or NSAIDs for 1 week. Resume other medications as before. Resume usual diet. No driving for 24 hours. Physician will call with biopsy results.

## 2017-12-20 NOTE — Transfer of Care (Signed)
Immediate Anesthesia Transfer of Care Note  Patient: Billy Davis  Procedure(s) Performed: ESOPHAGOGASTRODUODENOSCOPY (EGD) WITH PROPOFOL (N/A ) POLYPECTOMY  Patient Location: PACU  Anesthesia Type:MAC  Level of Consciousness: awake, alert  and patient cooperative  Airway & Oxygen Therapy: Patient Spontanous Breathing and Patient connected to nasal cannula oxygen  Post-op Assessment: Report given to RN and Post -op Vital signs reviewed and stable  Post vital signs: Reviewed and stable  Last Vitals:  Vitals Value Taken Time  BP    Temp    Pulse    Resp    SpO2      Last Pain:  Vitals:   12/20/17 1010  TempSrc:   PainSc: 0-No pain      Patients Stated Pain Goal: 4 (80/03/49 1791)  Complications: No apparent anesthesia complications

## 2017-12-20 NOTE — Addendum Note (Signed)
Addendum  created 12/20/17 1113 by Vista Deck, CRNA   Intraprocedure Flowsheets edited

## 2017-12-20 NOTE — Anesthesia Preprocedure Evaluation (Signed)
Anesthesia Evaluation  Patient identified by MRN, date of birth, ID band Patient awake    Reviewed: Allergy & Precautions, H&P , NPO status , Patient's Chart, lab work & pertinent test results, reviewed documented beta blocker date and time   Airway Mallampati: II  TM Distance: >3 FB Neck ROM: full    Dental no notable dental hx.    Pulmonary neg pulmonary ROS, former smoker,    Pulmonary exam normal breath sounds clear to auscultation       Cardiovascular Exercise Tolerance: Good hypertension, + CAD  negative cardio ROS   Rhythm:regular Rate:Normal     Neuro/Psych negative neurological ROS  negative psych ROS   GI/Hepatic negative GI ROS, Neg liver ROS, GERD  ,(+) Cirrhosis       ,   Endo/Other  negative endocrine ROSdiabetesMorbid obesity  Renal/GU negative Renal ROS  negative genitourinary   Musculoskeletal   Abdominal   Peds  Hematology negative hematology ROS (+)   Anesthesia Other Findings Other cirrhosis of liver (HCC) Prostate cancer ([place])  Reproductive/Obstetrics negative OB ROS                             Anesthesia Physical Anesthesia Plan  ASA: III  Anesthesia Plan: General   Post-op Pain Management:    Induction:   PONV Risk Score and Plan:   Airway Management Planned:   Additional Equipment:   Intra-op Plan:   Post-operative Plan:   Informed Consent: I have reviewed the patients History and Physical, chart, labs and discussed the procedure including the risks, benefits and alternatives for the proposed anesthesia with the patient or authorized representative who has indicated his/her understanding and acceptance.   Dental Advisory Given  Plan Discussed with: CRNA  Anesthesia Plan Comments:         Anesthesia Quick Evaluation

## 2017-12-20 NOTE — H&P (Signed)
Billy Davis is an 62 y.o. male.   Chief Complaint: Patient is here for EGD and possible esophageal variceal banding. HPI: Patient is 62 year old Caucasian male who has cirrhosis secondary to NASH who is undergoing EGD to screen for and treat esophageal varices.  He denies abdominal pain nausea vomiting hematemesis or melena. He has compensated cirrhosis.  He has not been screened since June 2012.  Past Medical History:  Diagnosis Date  . 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 CAD, multiple vessel    PCI circumflex 2008, diffuse disease of the right and LAD treated medically.,  //   Nuclear 2010 no ischemia   //   nuclear May, 2012 no ischemia  . Cirrhosis of liver without mention of alcohol   . Contrast media allergy    pt states he is not allergy to contrast  . Diabetes mellitus without complication ([place])    type 2  . Diverticulosis   . Dysphagia   . Ejection fraction    EF 55%, nuclear, May, 2012  . Esophageal reflux   . IBS (irritable bowel syndrome)   . Lower abdominal pain   . Mixed dyslipidemia   . Morbid obesity ([place])   . Other chronic nonalcoholic liver disease   . Postsurgical percutaneous transluminal coronary angioplasty status   . Preop cardiovascular exam    Cardiovascular clearance for prostate surgery October, 2013  . Prostate cancer ([place]) 03/24/12  . Unspecified diastolic heart failure   . Unspecified essential hypertension     Past Surgical History:  Procedure Laterality Date  . BACK SURGERY     took cyst out of back  . CARDIAC CATHETERIZATION    . CARPAL TUNNEL RELEASE Bilateral   . CHOLECYSTECTOMY  06/1998  . COLONOSCOPY  03/03/03  . COLONOSCOPY  08/17/2011   Procedure: COLONOSCOPY;  Surgeon: Rogene Houston, MD;  Location: [facility] ENDO SUITE;  Service: Endoscopy;  Laterality: N/A;  1045  . COLONOSCOPY N/A 07/05/2017   Procedure: COLONOSCOPY;  Surgeon: Rogene Houston, MD;  Location: [facility] ENDO SUITE;   Service: Endoscopy;  Laterality: N/A;  2:00  . LEFT HEART CATHETERIZATION WITH CORONARY ANGIOGRAM N/A 12/07/2013   Procedure: LEFT HEART CATHETERIZATION WITH CORONARY ANGIOGRAM;  Surgeon: Peter M Martinique, MD;  Location: [facility] CATH LAB;  Service: Cardiovascular;  Laterality: N/A;  . left rotator    . RIGHT KNEE SURGERY    . ROBOT ASSISTED LAPAROSCOPIC RADICAL PROSTATECTOMY  06/26/2012   Procedure: ROBOTIC ASSISTED LAPAROSCOPIC RADICAL PROSTATECTOMY LEVEL 3;  Surgeon: Dutch Gray, MD;  Location: [facility] ORS;  Service: Urology;  Laterality: N/A;      . Two cardiac stents  2009  . UPPER GASTROINTESTINAL ENDOSCOPY  02/21/2011   EGD ED  . UPPER GASTROINTESTINAL ENDOSCOPY  03/03/03   TCS  . VASECTOMY      Family History  Problem Relation Age of Onset  . Heart disease Mother   . Breast cancer Mother   . Heart disease Father   . Breast cancer Sister   . Ovarian cancer Sister   . Heart disease Brother   . Healthy Daughter   . Healthy Daughter   . Healthy Daughter   . Healthy Daughter   . Coronary artery disease Unknown        family history   Social History:  reports that he quit smoking about 19 years ago. His smoking use included cigarettes. He has a 90.00 pack-year smoking history. He has never  used smokeless tobacco. He reports that he does not drink alcohol or use drugs.  Allergies:  Allergies  Allergen Reactions  . Codeine Palpitations  . Plavix [Clopidogrel Bisulfate] Rash    Medications Prior to Admission  Medication Sig Dispense Refill  . amLODipine (NORVASC) 5 MG tablet Take 5 mg by mouth daily.    Marland Kitchen aspirin EC 81 MG tablet Take 81 mg by mouth daily.    . chlorthalidone (HYGROTON) 25 MG tablet TAKE 1 TABLET DAILY 30 tablet 0  . dicyclomine (BENTYL) 10 MG capsule TAKE 1 CAPSULE DAILY 60 capsule 5  . ibuprofen (ADVIL,MOTRIN) 200 MG tablet Take 600 mg by mouth every 8 (eight) hours as needed for mild pain or moderate pain.     Marland Kitchen insulin glargine (LANTUS) 100 unit/mL SOPN Inject 76  Units into the skin at bedtime.    Marland Kitchen lisinopril (PRINIVIL,ZESTRIL) 40 MG tablet Take 40 mg by mouth every morning.     . metoprolol (LOPRESSOR) 50 MG tablet Take 50 mg by mouth daily.    . Multiple Vitamin (MULTI VITAMIN MENS PO) Take 1 tablet by mouth daily.     Marland Kitchen NITROSTAT 0.4 MG SL tablet Place 1 tablet under the tongue every 5 (five) minutes as needed. For chest pains    . potassium chloride SA (K-DUR,KLOR-CON) 20 MEQ tablet Take 1 tablet (20 mEq total) by mouth daily. 30 tablet 2  . pantoprazole (PROTONIX) 40 MG tablet Take 40 mg by mouth daily.      Results for orders placed or performed during the hospital encounter of 12/20/17 (from the past 48 hour(s))  Glucose, capillary     Status: Abnormal   Collection Time: 12/20/17  8:43 AM  Result Value Ref Range   Glucose-Capillary 139 (H) 65 - 99 mg/dL   No results found.  ROS  Blood pressure 140/80, pulse (!) 59, temperature 98.4 F (36.9 C), temperature source Oral, SpO2 94 %. Physical Exam  Constitutional: He appears well-developed and well-nourished.  HENT:  Mouth/Throat: Oropharynx is clear and moist.  Eyes: Conjunctivae are normal. No scleral icterus.  Neck: No thyromegaly present.  Cardiovascular: Normal rate, regular rhythm and normal heart sounds.  No murmur heard. Respiratory: Effort normal and breath sounds normal.  GI:  Abdomen is obese but soft and nontender with organomegaly or masses.  Musculoskeletal: He exhibits no edema.  Lymphadenopathy:    He has no cervical adenopathy.  Neurological: He is alert.  Skin: Skin is warm and dry.     Assessment/Plan Cirrhosis secondary to NASH. EGD to screen for and treat esophageal varices.  Hildred Laser, MD 12/20/2017, 10:04 AM

## 2017-12-20 NOTE — Op Note (Signed)
[facility] Patient Name: Billy Davis Procedure Date: 12/20/2017 10:00 AM MRN: [id number] Date of Birth: 11-May-1956 Attending MD: Hildred Laser , MD CSN: [id number] Age: 62 Admit Type: Outpatient Procedure:                Upper GI endoscopy Indications:              Screening procedure, Cirrhosis rule out esophageal                            varices Providers:                Hildred Laser, MD, Charlsie Quest. Theda Sers RN, RN,                            Randa Spike, Technician Referring MD:             Arsenio Katz, [facility] Medicines:                Lidocaine spray, Propofol per Anesthesia Complications:            No immediate complications. Estimated Blood Loss:     Estimated blood loss was minimal. Procedure:                Pre-Anesthesia Assessment:                           - Prior to the procedure, a History and Physical                            was performed, and patient medications and                            allergies were reviewed. The patient's tolerance of                            previous anesthesia was also reviewed. The risks                            and benefits of the procedure and the sedation                            options and risks were discussed with the patient.                            All questions were answered, and informed consent                            was obtained. Prior Anticoagulants: The patient                            last took aspirin 2 days prior to the procedure.                            ASA Grade Assessment: III - A patient with severe  systemic disease. After reviewing the risks and                            benefits, the patient was deemed in satisfactory                            condition to undergo the procedure.                           After obtaining informed consent, the endoscope was                            passed under direct vision. Throughout the   procedure, the patient's blood pressure, pulse, and                            oxygen saturations were monitored continuously. The                            EG29-I10 ([id number]) scope was introduced through the                            and advanced to the second part of duodenum. The                            upper GI endoscopy was accomplished without                            difficulty. The patient tolerated the procedure                            well. Scope In: 10:17:01 AM Scope Out: 10:32:05 AM Total Procedure Duration: 0 hours 15 minutes 4 seconds  Findings:      The proximal esophagus and mid esophagus were normal.      Grade I varices were found in the distal esophagus.      The Z-line was irregular and was found 43 cm from the incisors.      Mild portal hypertensive gastropathy was found in the gastric fundus, in       the gastric body and in the gastric antrum.      Two 6 to 12 mm semi-sessile polyps with no stigmata of recent bleeding       were found in the gastric antrum. The polyp was removed with a hot       snare. Resection and retrieval were complete. To stop active bleeding,       one hemostatic clip was successfully placed (MR conditional). There was       no bleeding at the end of the procedure.      A single 10 mm semi-sessile polyp with no stigmata of recent bleeding       was found in the prepyloric region of the stomach. The polyp was removed       with a hot snare. Resection and retrieval were complete. The pathology       specimen was placed into Bottle Number 1.      The duodenal bulb and second portion of the  duodenum were normal. Impression:               - Normal proximal esophagus and mid esophagus.                           - Grade I esophageal varices.                           - Z-line irregular, 43 cm from the incisors.                           - Portal hypertensive gastropathy.                           - Two gastric polyps. Resected and  retrieved. Clip                            (MR conditional) was placed.                           - A single gastric polyp. Resected and retrieved.                           - Normal duodenal bulb and second portion of the                            duodenum. Moderate Sedation:      Per Anesthesia Care Recommendation:           - Patient has a contact number available for                            emergencies. The signs and symptoms of potential                            delayed complications were discussed with the                            patient. Return to normal activities tomorrow.                            Written discharge instructions were provided to the                            patient.                           - Resume previous diet today.                           - Continue present medications.                           - No aspirin, ibuprofen, naproxen, or other                            non-steroidal anti-inflammatory drugs for 7  days                            after polyp removal.                           - Await pathology results.                           - Repeat upper endoscopy in 1 year. Procedure Code(s):        --- Professional ---                           [phone], Esophagogastroduodenoscopy, flexible,                            transoral; with removal of tumor(s), polyp(s), or                            other lesion(s) by snare technique Diagnosis Code(s):        --- Professional ---                           K74.60, Unspecified cirrhosis of liver                           I85.10, Secondary esophageal varices without                            bleeding                           K22.8, Other specified diseases of esophagus                           K76.6, Portal hypertension                           K31.89, Other diseases of stomach and duodenum                           K31.7, Polyp of stomach and duodenum                           Z13.810, Encounter  for screening for upper                            gastrointestinal disorder CPT copyright 2017 [employer]. All rights reserved. The codes documented in this report are preliminary and upon coder review may  be revised to meet current compliance requirements. Hildred Laser, MD Hildred Laser, MD 12/20/2017 10:42:15 AM This report has been signed electronically. Number of Addenda: 0

## 2017-12-24 ENCOUNTER — Encounter (HOSPITAL_COMMUNITY): Payer: Self-pay | Admitting: Internal Medicine

## 2018-03-31 IMAGING — CT CT L SPINE W/ CM
1 of 7 series · 5 of 14 positions shown, 7 images · non-contrast
Comparison: MRI lumbar spine 11/24/2014.

CLINICAL DATA: Low back pain. BILATERAL leg pain. Pain much worse
with standing and walking short distances.
TECHNIQUE: Contiguous axial images were obtained through the Lumbar spine after
the intrathecal infusion of infusion. Coronal and sagittal
reconstructions were obtained of the axial image sets.

[Series 3: l spine soft · axial · 0.34mm/px · z∈[-397,-196]mm · 5 of 101 slices shown, 7 images]
[im 17/101  soft-tissue]
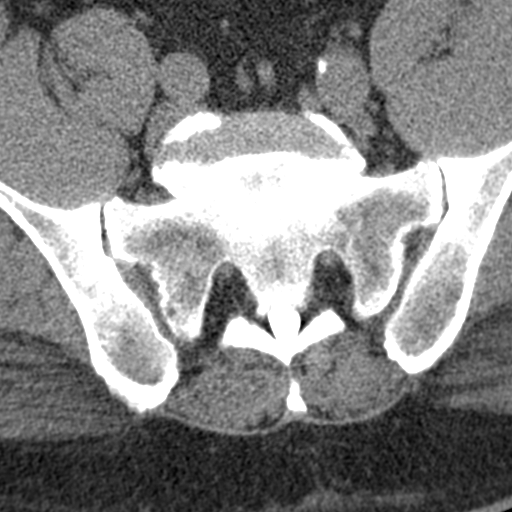
[im 17/101  bone]
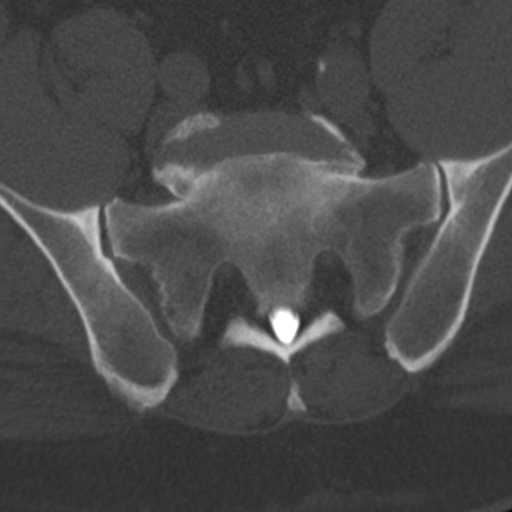
[im 34/101  bone]
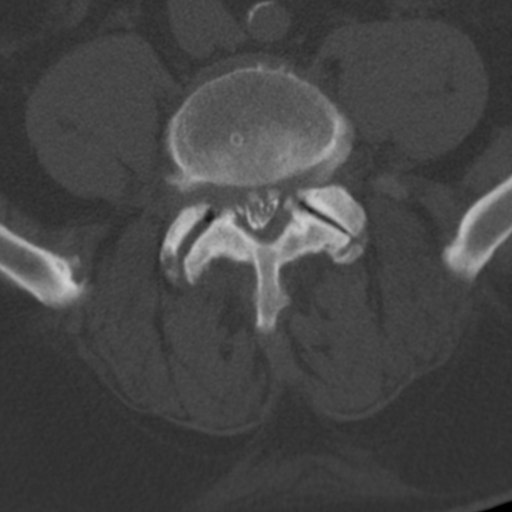
[im 51/101  bone]
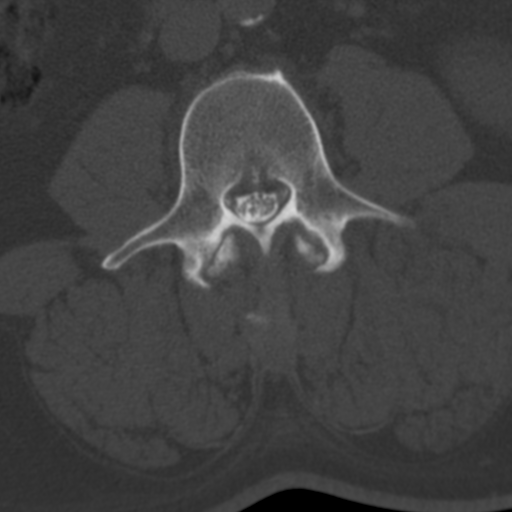
[im 67/101  bone]
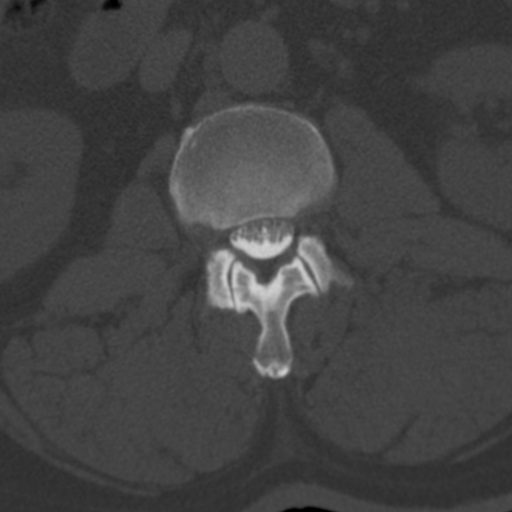
[im 84/101  soft-tissue]
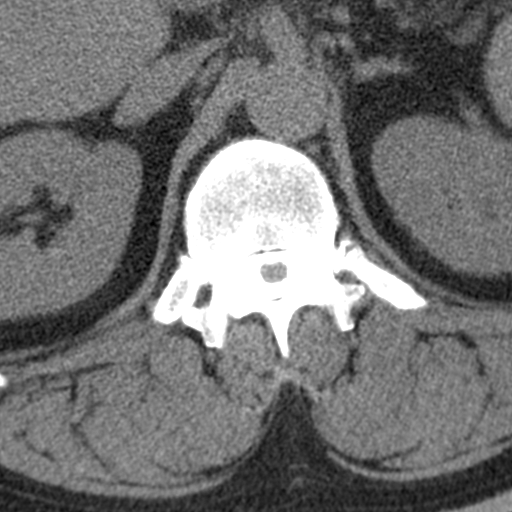
[im 84/101  bone]
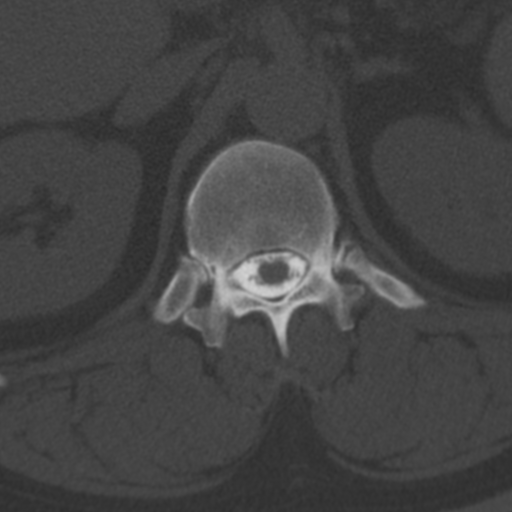

[5 of 14 positions shown; findings below may reference images not displayed]

EXAM:
LUMBAR MYELOGRAM

FLUOROSCOPY TIME:  25 seconds corresponding to a Dose Area Product
of 358.92 ?Gy*m2

PROCEDURE:
After thorough discussion of risks and benefits of the procedure
including bleeding, infection, injury to nerves, blood vessels,
adjacent structures as well as headache and CSF leak, written and
oral informed consent was obtained. Consent was obtained by Dr. Saparilla
Jaylon. Time out form was completed.

Patient was positioned prone on the fluoroscopy table. Local
anesthesia was provided with 1% lidocaine without epinephrine after
prepped and draped in the usual sterile fashion. Puncture was
performed at L5-S1 using a 5 inch 22-gauge spinal needle via RIGHT
paramedian approach. Using a single pass through the dura, the
needle was placed within the thecal sac, with return of clear CSF.
15 mL of Isovue-M 200 was injected into the thecal sac, with normal
opacification of the nerve roots and cauda equina consistent with
free flow within the subarachnoid space.

I personally performed the lumbar puncture and administered the
intrathecal contrast. I also personally supervised acquisition of
the myelogram images.
FINDINGS: LUMBAR MYELOGRAM FINDINGS:

Good opacification lumbar subarachnoid space. Anatomic alignment
except for 10 mm anterolisthesis L5-S1. Severe disc space narrowing
at the L5-S1 level. BILATERAL L5 spondylolysis.

Moderate to severe stenosis at L3-4 with BILATERAL RIGHT greater
than LEFT L4 nerve root cut off. Severe stenosis at L4-5 with
BILATERAL L5 nerve root cut off. Mild stenosis at L5-S1 with LEFT
greater than RIGHT S1 nerve root effacement.

With patient standing, the stenosis at L4-5 becomes more severe. In
addition subarticular zone narrowing at L2-3 develops, compressing
the RIGHT L3 nerve root. The moderate to severe stenosis at L3-4
also becomes worse. LEFT S1 nerve root impingement the L5-S1 level
is worse with patient standing.

Lateral flexion extension radiographs were performed with patient
upright. In neutral, flexion, and extension, the anterolisthesis at
L5-S1 is unchanged at 10 mm.

CT LUMBAR MYELOGRAM FINDINGS:

Segmentation: Normal.

Alignment: With patient recumbent for CT, the anterolisthesis at
L5-S1 decreases to 7 mm.

Vertebrae: No worrisome osseous lesion.

Conus medullaris: Normal in size and location.

Paraspinal tissues: No evidence for hydronephrosis or paravertebral
mass.

Disc levels:

T10-T11 incompletely evaluated.  Central stenosis is suspected.

T11-T12: Central protrusion. Effacement anterior subarachnoid space
with minimal distal thoracic cord flattening.

T12-L1:  Negative.

L1-L2:  Mild facet arthropathy.  No impingement.

L2-L3: Calcified central protrusion extends into the foraminal and
extraforaminal compartments. Congenital short pedicles, facet
arthropathy, and epidural lipomatosis contribute to moderate
stenosis with RIGHT greater than LEFT L3 nerve root impingement.
BILATERAL foraminal narrowing could affect either L2 nerve root.

L3-L4: Calcified central protrusion extends to the foraminal
extraforaminal compartments. Congenitally short pedicles and facet
arthropathy along with epidural lipomatosis contribute to moderate
stenosis. BILATERAL LEFT greater than RIGHT L4 and L3 nerve root
impingement.

L4-L5: Calcified central protrusion. Posterior element hypertrophy.
Short pedicles. Severe stenosis. RIGHT greater than LEFT L4 and L5
nerve root impingement.

L5-S1: 7 mm anterolisthesis. BILATERAL L5 spondylolysis with grade 1
spondylolisthesis. Calcified protrusion and severe facet arthropathy
is superimposed. RIGHT greater than LEFT L5 nerve root impingement.
LEFT greater than RIGHT S1 nerve root impingement.

Compared with prior MR, similar appearance.
IMPRESSION: LUMBAR MYELOGRAM IMPRESSION:

Severe stenosis at L4-5. Moderate to severe stenosis at L3-4, and
mild stenosis at L5-S1.

10 mm anterolisthesis with patient prone for myelography, and
upright lateral radiographs, with no worsening between flexion and
extension.

CT LUMBAR MYELOGRAM IMPRESSION:

Severe multifactorial stenosis at L4-5 relates to calcified central
protrusion, posterior element hypertrophy, and short pedicles with
RIGHT greater than LEFT L4 and L5 nerve root impingement.

Moderate to severe stenosis at L2-3 and L3-4 related to short
pedicles, posterior element hypertrophy, epidural lipomatosis, and
calcified disc protrusions.

BILATERAL L5 spondylolysis with grade 1 spondylolisthesis at L5-S1,
7 mm with patient recumbent for CT. Calcified protrusion and severe
facet arthropathy is superimposed. RIGHT greater than LEFT L5 and
LEFT greater than RIGHT S1 nerve root impingement are observed.

## 2018-05-06 ENCOUNTER — Ambulatory Visit (INDEPENDENT_AMBULATORY_CARE_PROVIDER_SITE_OTHER): Payer: Managed Care, Other (non HMO) | Admitting: Internal Medicine

## 2018-07-29 ENCOUNTER — Other Ambulatory Visit (INDEPENDENT_AMBULATORY_CARE_PROVIDER_SITE_OTHER): Payer: Self-pay | Admitting: Internal Medicine

## 2018-10-02 ENCOUNTER — Ambulatory Visit (INDEPENDENT_AMBULATORY_CARE_PROVIDER_SITE_OTHER): Payer: Medicare PPO | Admitting: Orthopaedic Surgery

## 2018-10-02 ENCOUNTER — Encounter (INDEPENDENT_AMBULATORY_CARE_PROVIDER_SITE_OTHER): Payer: Self-pay | Admitting: Orthopaedic Surgery

## 2018-10-02 VITALS — BP 160/91 | HR 75 | Ht 74.0 in | Wt 313.0 lb

## 2018-10-02 DIAGNOSIS — M1711 Unilateral primary osteoarthritis, right knee: Secondary | ICD-10-CM

## 2018-10-02 MED ORDER — METHYLPREDNISOLONE ACETATE 40 MG/ML IJ SUSP
40.0000 mg | INTRAMUSCULAR | Status: AC | PRN
  Administered 2018-10-02: 40 mg via INTRA_ARTICULAR

## 2018-10-02 MED ORDER — BUPIVACAINE HCL 0.25 % IJ SOLN
4.0000 mL | INTRAMUSCULAR | Status: AC | PRN
  Administered 2018-10-02: 4 mL via INTRA_ARTICULAR

## 2018-10-02 MED ORDER — LIDOCAINE HCL 1 % IJ SOLN
0.5000 mL | INTRAMUSCULAR | Status: AC | PRN
  Administered 2018-10-02: .5 mL

## 2018-10-02 NOTE — Progress Notes (Signed)
Office Visit Note   Patient: Billy Davis           Date of Birth: [birth year]           MRN: [id number] Visit Date: 10/02/2018              Requested by: Arsenio Katz, NP [place], [place] [address] PCP: Arsenio Katz, NP   Assessment & Plan: Visit Diagnoses:  1. Unilateral primary osteoarthritis, right knee     Plan: Patient needs to elevate his legs at night put on support stockings early in the morning and wear them for his pitting edema.  He can elevate his legs intermittently if the toes feel hot and tight.  We will inject his knees how he does recheck him in 3 weeks.  If he is having persistent problems with his knee will repeat standing [facility] x-rays both knee lateral right knee and bilateral sunrise patella.   Follow-Up Instructions: No follow-ups on file.   Orders:  Orders Placed This Encounter  Procedures  . Large Joint Inj   No orders of the defined types were placed in this encounter.     Procedures: Large Joint Inj: R knee on 10/02/2018 10:16 AM Indications: pain and joint swelling Details: 22 G 1.5 in needle, anterolateral approach  Arthrogram: No  Medications: 40 mg methylPREDNISolone acetate 40 MG/ML; 0.5 mL lidocaine 1 %; 4 mL bupivacaine 0.25 % Outcome: tolerated well, no immediate complications Procedure, treatment alternatives, risks and benefits explained, specific risks discussed. Consent was given by the patient. Immediately prior to procedure a time out was called to verify the correct patient, procedure, equipment, support staff and site/side marked as required. Patient was prepped and draped in the usual sterile fashion.       Clinical Data: No additional findings.   Subjective: Chief Complaint  Patient presents with  . Right Shoulder - Pain  . Left Shoulder - Pain  . Right Hip - Pain  . Left Hip - Pain  . Right Knee - Pain    HPI 63 year old male returns he is having increased problems with his right knee.  He had a fall last  year had previous biceps tendon tenodesis on the left shoulder and then x-rayed his right shoulder told him that he likely has a rotator cuff tear has had problems with pitting edema he takes medicine for blood pressure and also is been on Lasix.  He had 4+ pitting edema without venous stasis problems.  Right knee is painful he is been walking with 2 canes.  Anti-inflammatories have not helped nor is using the cane.  Her knee is going to fall due to his knee pain.  BMI is a 40.0 range.  Patient is also had back surgery single level fusion by Dr. Joya Salm and has had persistent problems with right leg numbness lateral calf since the surgery but not weakness.  He states he saw Dr. Cyndy Freeze who wanted to do multilevel instrumented fusion which patient states he wanted to avoid.  His principal problem is been his right knee pain.  He has right knee pain swelling limping inability reach full extension 10 degree flexion contracture.  Review of Systems of systems positive for unspecific diastolic heart failure, hypertension, IBS, previous ejection fraction 2000 1255%.  Coronary disease, aneurysm, diabetes on insulin, history of fall.  Left biceps tenodesis.  Right shoulder pain.  History of liver cirrhosis.   Objective: Vital Signs: BP (!) 160/91   Pulse 75  Ht 6' 2"  (1.88 m)   Wt (!) 313 lb (142 kg)   BMI 40.19 kg/m   Physical Exam Constitutional:      Appearance: He is well-developed.  HENT:     Head: Normocephalic and atraumatic.  Eyes:     Pupils: Pupils are equal, round, and reactive to light.  Neck:     Thyroid: No thyromegaly.     Trachea: No tracheal deviation.  Cardiovascular:     Rate and Rhythm: Normal rate.  Pulmonary:     Effort: Pulmonary effort is normal.     Breath sounds: No wheezing.  Abdominal:     General: Bowel sounds are normal.     Palpations: Abdomen is soft.  Skin:    General: Skin is warm and dry.     Capillary Refill: Capillary refill takes less than 2 seconds.    Neurological:     Mental Status: He is alert and oriented to person, place, and time.  Psychiatric:        Behavior: Behavior normal.        Thought Content: Thought content normal.        Judgment: Judgment normal.     Ortho Exam patient has well-healed left shoulder biceps tenodesis incision.  Negative drop arm test left shoulder but he does have pain but fairly good resistance.  Opposite right shoulder shows weakness with resisted supraspinatus.  Positive impingement right shoulder.  Bilateral 4+ pitting edema without venous ulceration.  Right knee crepitus range of motion limited only 10 to 90 degrees varus valgus ACL PCL exam is normal.  Opposite left knee shows mild crepitus.  Ambulates with 2 canes and can ambulate with a single cane but has pronounced knee limp on the right.  Specialty Comments:  No specialty comments available.  Imaging: No results found.   PMFS History: Patient Active Problem List   Diagnosis Date Noted  . Other cirrhosis of liver ([place]) 10/29/2017  . Unilateral primary osteoarthritis, right knee 10/24/2017  . Rectal bleeding 06/06/2017  . 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 DM (diabetes mellitus) ([place]) 11/09/2013  . Unspecified diastolic heart failure   . Morbid obesity ([place])   . IBS (irritable bowel syndrome)   . Ejection fraction   . CAD, multiple vessel   . Contrast media allergy   . Aneurysm ([place])   . Preop cardiovascular exam   . Prostate cancer ([place]) 03/24/2012  . Diastolic dysfunction 01/74/9449  . GERD (gastroesophageal reflux disease) 12/27/2010  . Hypertension 12/27/2010  . DYSLIPIDEMIA 12/26/2009  . FATTY LIVER DISEASE 12/26/2009   Past Medical History:  Diagnosis Date  . 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 CAD, multiple vessel    PCI circumflex 2008, diffuse disease of the right and LAD treated medically.,  //   Nuclear 2010 no ischemia    //   nuclear May, 2012 no ischemia  . Cirrhosis of liver without mention of alcohol   . Contrast media allergy    pt states he is not allergy to contrast  . Diabetes mellitus without complication ([place])    type 2  . Diverticulosis   . Dysphagia   . Ejection fraction    EF 55%, nuclear, May, 2012  . Esophageal reflux   . IBS (irritable bowel syndrome)   . Lower abdominal pain   . Mixed dyslipidemia   . Morbid obesity ([place])   . Other chronic nonalcoholic liver disease   .  Postsurgical percutaneous transluminal coronary angioplasty status   . Preop cardiovascular exam    Cardiovascular clearance for prostate surgery October, 2013  . Prostate cancer ([place]) 03/24/12  . Unspecified diastolic heart failure   . Unspecified essential hypertension     Family History  Problem Relation Age of Onset  . Heart disease Mother   . Breast cancer Mother   . Heart disease Father   . Breast cancer Sister   . Ovarian cancer Sister   . Heart disease Brother   . Healthy Daughter   . Healthy Daughter   . Healthy Daughter   . Healthy Daughter   . Coronary artery disease Unknown        family history    Past Surgical History:  Procedure Laterality Date  . BACK SURGERY     took cyst out of back  . CARDIAC CATHETERIZATION    . CARPAL TUNNEL RELEASE Bilateral   . CHOLECYSTECTOMY  06/1998  . COLONOSCOPY  03/03/03  . COLONOSCOPY  08/17/2011   Procedure: COLONOSCOPY;  Surgeon: Rogene Houston, MD;  Location: [facility];  Service: Endoscopy;  Laterality: N/A;  1045  . COLONOSCOPY N/A 07/05/2017   Procedure: COLONOSCOPY;  Surgeon: Rogene Houston, MD;  Location: [facility];  Service: Endoscopy;  Laterality: N/A;  2:00  . ESOPHAGOGASTRODUODENOSCOPY (EGD) WITH PROPOFOL N/A 12/20/2017   Procedure: ESOPHAGOGASTRODUODENOSCOPY (EGD) WITH PROPOFOL;  Surgeon: Rogene Houston, MD;  [facility];  Service: Endoscopy;  Laterality: N/A;  11:10 :  . LEFT HEART CATHETERIZATION WITH CORONARY  ANGIOGRAM N/A 12/07/2013   Procedure: LEFT HEART CATHETERIZATION WITH CORONARY ANGIOGRAM;  Surgeon: Peter M Martinique, MD;  [facility] LAB;  Service: Cardiovascular;  Laterality: N/A;  . left rotator    . POLYPECTOMY  12/20/2017   Procedure: POLYPECTOMY;  Surgeon: Rogene Houston, MD;  Location: [facility];  Service: Endoscopy;;  Gastric (HS) x2  . RIGHT KNEE SURGERY    . ROBOT ASSISTED LAPAROSCOPIC RADICAL PROSTATECTOMY  06/26/2012   Procedure: ROBOTIC ASSISTED LAPAROSCOPIC RADICAL PROSTATECTOMY LEVEL 3;  Surgeon: Dutch Gray, MD;  Location: [facility] OR[facility];  Service: Urology;  Laterality: N/A;      . Two cardiac stents  2009  . UPPER GASTROINTESTINAL ENDOSCOPY  02/21/2011   EGD ED  . UPPER GASTROINTESTINAL ENDOSCOPY  03/03/03   TCS  . VASECTOMY     Social History   Occupational History  . Occupation: [employer]  Tobacco Use  . Smoking status: Former Smoker    Packs/day: 3.00    Years: 30.00    Pack years: 90.00    Types: Cigarettes    Last attempt to quit: 08/27/1998    Years since quitting: 20.1  . Smokeless tobacco: Never Used  . Tobacco comment: Pt quit smoking 10 yrs ago, smoked for about 30 yrs.  Substance and Sexual Activity  . Alcohol use: No    Alcohol/week: 0.0 standard drinks  . Drug use: No  . Sexual activity: Not on file

## 2018-10-14 DIAGNOSIS — Z6841 Body Mass Index (BMI) 40.0 and over, adult: Secondary | ICD-10-CM | POA: Diagnosis not present

## 2018-10-14 DIAGNOSIS — Z299 Encounter for prophylactic measures, unspecified: Secondary | ICD-10-CM | POA: Diagnosis not present

## 2018-10-14 DIAGNOSIS — I10 Essential (primary) hypertension: Secondary | ICD-10-CM | POA: Diagnosis not present

## 2018-10-14 DIAGNOSIS — E1165 Type 2 diabetes mellitus with hyperglycemia: Secondary | ICD-10-CM | POA: Diagnosis not present

## 2018-10-14 DIAGNOSIS — K7460 Unspecified cirrhosis of liver: Secondary | ICD-10-CM | POA: Diagnosis not present

## 2018-10-23 ENCOUNTER — Ambulatory Visit (INDEPENDENT_AMBULATORY_CARE_PROVIDER_SITE_OTHER): Payer: Medicare PPO | Admitting: Orthopaedic Surgery

## 2018-10-23 ENCOUNTER — Encounter (INDEPENDENT_AMBULATORY_CARE_PROVIDER_SITE_OTHER): Payer: Self-pay | Admitting: Orthopaedic Surgery

## 2018-10-23 VITALS — BP 151/83 | HR 85 | Ht 74.0 in | Wt 313.0 lb

## 2018-10-23 DIAGNOSIS — M4316 Spondylolisthesis, lumbar region: Secondary | ICD-10-CM | POA: Diagnosis not present

## 2018-10-23 DIAGNOSIS — M1711 Unilateral primary osteoarthritis, right knee: Secondary | ICD-10-CM | POA: Diagnosis not present

## 2018-10-23 DIAGNOSIS — M5136 Other intervertebral disc degeneration, lumbar region: Secondary | ICD-10-CM

## 2018-10-23 NOTE — Progress Notes (Signed)
Office Visit Note   Patient: Billy Davis           Date of Birth: 29-Nov-1955           MRN: [id number] Visit Date: 10/23/2018              Requested by: Arsenio Katz, NP [place], [place] [address] PCP: Arsenio Katz, NP   Assessment & Plan: Visit Diagnoses:  1. Unilateral primary osteoarthritis, right knee   2. Lumbar adjacent segment disease with spondylolisthesis     Plan: Patient has some problems with numbness in his right leg but no progressive weakness.  Mild right knee swelling but he has been active ambulatory.  I will recheck him in 3 to 6 months.  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  Follow-Up Instructions: Return in about 6 months (around 04/23/2019).   Orders:  No orders of the defined types were placed in this encounter.  No orders of the defined types were placed in this encounter.     Procedures: No procedures performed   Clinical Data: No additional findings.   Subjective: Chief Complaint  Patient presents with  . Right Knee - Follow-up  . Right Shoulder - Pain  . Left Shoulder - Pain    HPI 63 year old male returns he is continued to have some problems with his right knee not as severe since the injection 10/02/2018.  He still has problems with right leg numbness and had previous instrumented fusion by Dr. Joya Salm.  He had seen Dr. Cyndy Freeze who had recommended a 5 level fusion.  He has not fused through the interspace.  He has anterolisthesis L4-5 L5-S1 with moderate foraminal narrowing bilaterally.  Foraminal narrowing at levels above as well.  He has a Fitbit states he has been walking 6 to 7 miles daily.  He is working on losing weight and had been advised to walk to help with his heart, hypertension and cholesterol.  He denies fever chills no knee locking.  Review of Systems 14 point updated unchanged from last office visit other  than as mentioned HPI.   Objective: Vital Signs: BP (!) 151/83   Pulse 85   Ht 6' 2"  (1.88 m)   Wt (!) 313 lb (142 kg)   BMI 40.19 kg/m   Physical Exam Constitutional:      Appearance: He is well-developed.  HENT:     Head: Normocephalic and atraumatic.  Eyes:     Pupils: Pupils are equal, round, and reactive to light.  Neck:     Thyroid: No thyromegaly.     Trachea: No tracheal deviation.  Cardiovascular:     Rate and Rhythm: Normal rate.  Pulmonary:     Effort: Pulmonary effort is normal.     Breath sounds: No wheezing.  Abdominal:     General: Bowel sounds are normal.     Palpations: Abdomen is soft.  Skin:    General: Skin is warm and dry.     Capillary Refill: Capillary refill takes less than 2 seconds.  Neurological:     Mental Status: He is alert and oriented to person, place, and time.  Psychiatric:        Behavior: Behavior normal.        Thought Content: Thought content normal.        Judgment: Judgment normal.     Ortho Exam  patient has some knee stiffness when he gets from sitting to standing.  Negative logroll to the hips.  No rash over exposed skin.  He is using compression stockings for the lower extremity swelling. Specialty Comments:  No specialty comments available.  Imaging: No results found.   PMFS History: Patient Active Problem List   Diagnosis Date Noted  . Other cirrhosis of liver ([place]) 10/29/2017  . Unilateral primary osteoarthritis, right knee 10/24/2017  . Rectal bleeding 06/06/2017  . 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 DM (diabetes mellitus) ([place]) 11/09/2013  . Unspecified diastolic heart failure   . Morbid obesity ([place])   . IBS (irritable bowel syndrome)   . Ejection fraction   . CAD, multiple vessel   . Contrast media allergy   . Aneurysm ([place])   . Preop cardiovascular exam   . Prostate cancer ([place]) 03/24/2012  . Diastolic dysfunction 85/46/2703  . GERD (gastroesophageal reflux disease)  12/27/2010  . Hypertension 12/27/2010  . DYSLIPIDEMIA 12/26/2009  . FATTY LIVER DISEASE 12/26/2009   Past Medical History:  Diagnosis Date  . Aneurysm ([place])    right femoral pseudoaneurysm, thrombosed (following 03/20/07 cardiac cath)  . CAD, multiple vessel    PCI circumflex 2008, diffuse disease of the right and LAD treated medically.,  //   Nuclear 2010 no ischemia   //   nuclear May, 2012 no ischemia  . Cirrhosis of liver without mention of alcohol   . Contrast media allergy    pt states he is not allergy to contrast  . Diabetes mellitus without complication ([place])    type 2  . Diverticulosis   . Dysphagia   . Ejection fraction    EF 55%, nuclear, May, 2012  . Esophageal reflux   . IBS (irritable bowel syndrome)   . Lower abdominal pain   . Mixed dyslipidemia   . Morbid obesity ([place])   . Other chronic nonalcoholic liver disease   . Postsurgical percutaneous transluminal coronary angioplasty status   . Preop cardiovascular exam    Cardiovascular clearance for prostate surgery October, 2013  . Prostate cancer ([place]) 03/24/12  . Unspecified diastolic heart failure   . Unspecified essential hypertension     Family History  Problem Relation Age of Onset  . Heart disease Mother   . Breast cancer Mother   . Heart disease Father   . Breast cancer Sister   . Ovarian cancer Sister   . Heart disease Brother   . Healthy Daughter   . Healthy Daughter   . Healthy Daughter   . Healthy Daughter   . Coronary artery disease Unknown        family history    Past Surgical History:  Procedure Laterality Date  . BACK SURGERY     took cyst out of back  . CARDIAC CATHETERIZATION    . CARPAL TUNNEL RELEASE Bilateral   . CHOLECYSTECTOMY  06/1998  . COLONOSCOPY  03/03/03  . COLONOSCOPY  08/17/2011   Procedure: COLONOSCOPY;  Surgeon: Rogene Houston, MD;  [facility];  Service: Endoscopy;  Laterality: N/A;  1045  . COLONOSCOPY N/A 07/05/2017   Procedure: COLONOSCOPY;   Surgeon: Rogene Houston, MD;  [facility];  Service: Endoscopy;  Laterality: N/A;  2:00  . ESOPHAGOGASTRODUODENOSCOPY (EGD) WITH PROPOFOL N/A 12/20/2017   Procedure: ESOPHAGOGASTRODUODENOSCOPY (EGD) WITH PROPOFOL;  Surgeon: Rogene Houston, MD;  [facility];  Service: Endoscopy;  Laterality: N/A;  11:10 :  . LEFT  HEART CATHETERIZATION WITH CORONARY ANGIOGRAM N/A 12/07/2013   Procedure: LEFT HEART CATHETERIZATION WITH CORONARY ANGIOGRAM;  Surgeon: Peter M Martinique, MD;  [facility] LAB;  Service: Cardiovascular;  Laterality: N/A;  . left rotator    . POLYPECTOMY  12/20/2017   Procedure: POLYPECTOMY;  Surgeon: Rogene Houston, MD;  [facility];  Service: Endoscopy;;  Gastric (HS) x2  . RIGHT KNEE SURGERY    . ROBOT ASSISTED LAPAROSCOPIC RADICAL PROSTATECTOMY  06/26/2012   Procedure: ROBOTIC ASSISTED LAPAROSCOPIC RADICAL PROSTATECTOMY LEVEL 3;  Surgeon: Dutch Gray, MD;  [facility]: [facility];  Service: Urology;  Laterality: N/A;      . Two cardiac stents  2009  . UPPER GASTROINTESTINAL ENDOSCOPY  02/21/2011   EGD ED  . UPPER GASTROINTESTINAL ENDOSCOPY  03/03/03   TCS  . VASECTOMY     Social History   Occupational History  . Occupation: [employer]  Tobacco Use  . Smoking status: Former Smoker    Packs/day: 3.00    Years: 30.00    Pack years: 90.00    Types: Cigarettes    Last attempt to quit: 08/27/1998    Years since quitting: 20.1  . Smokeless tobacco: Never Used  . Tobacco comment: Pt quit smoking 10 yrs ago, smoked for about 30 yrs.  Substance and Sexual Activity  . Alcohol use: No    Alcohol/week: 0.0 standard drinks  . Drug use: No  . Sexual activity: Not on file

## 2018-11-21 ENCOUNTER — Encounter (INDEPENDENT_AMBULATORY_CARE_PROVIDER_SITE_OTHER): Payer: Self-pay | Admitting: *Deleted

## 2018-11-28 IMAGING — US US ABDOMEN COMPLETE
1 series · 13 of 25 positions shown · non-contrast
Comparison: Ultrasound abdomen 02/14/2015 ; MRI abdomen 01/15/2014.

CLINICAL DATA: Patient with history of cirrhosis. Follow-up
evaluation.

EXAM:
ABDOMEN ULTRASOUND COMPLETE

[Series 1: us abdomen complete · 0.21mm/px · 13 of 93 slices shown]
[im 1/93]
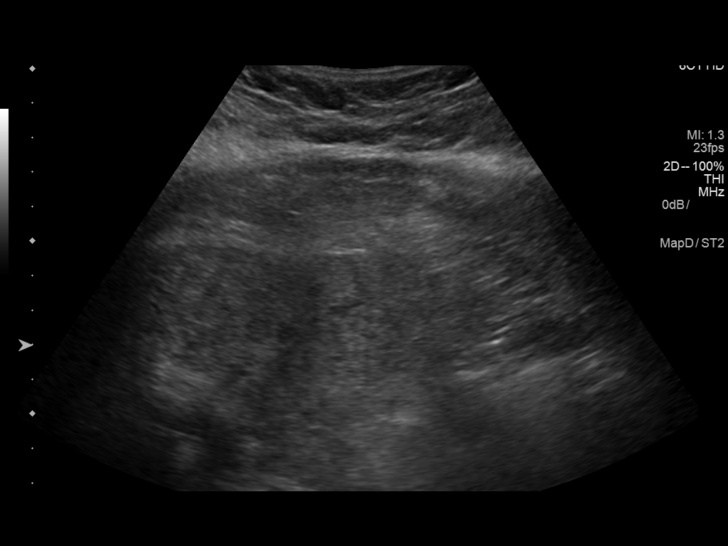
[im 8/93]
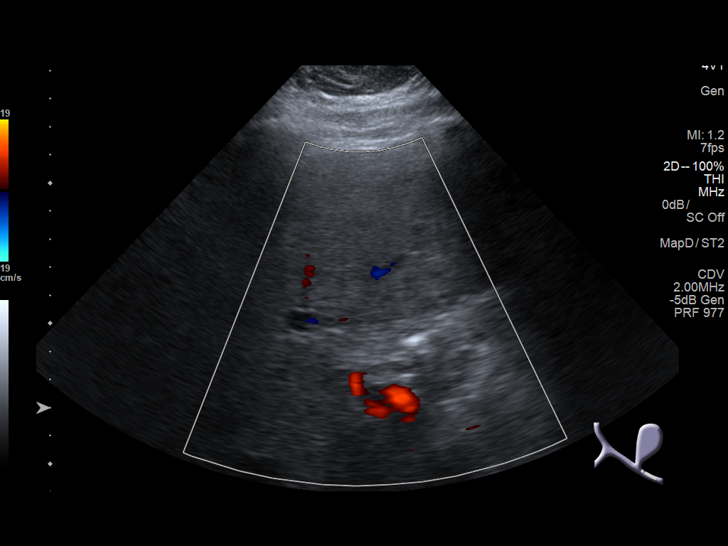
[im 16/93]
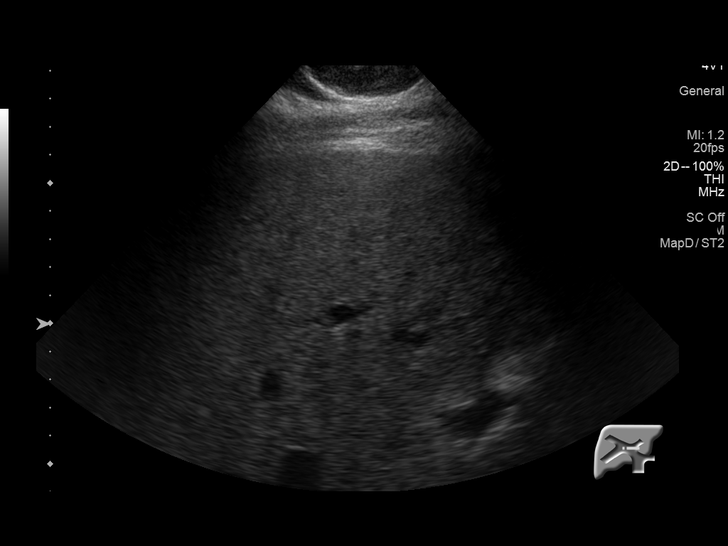
[im 24/93]
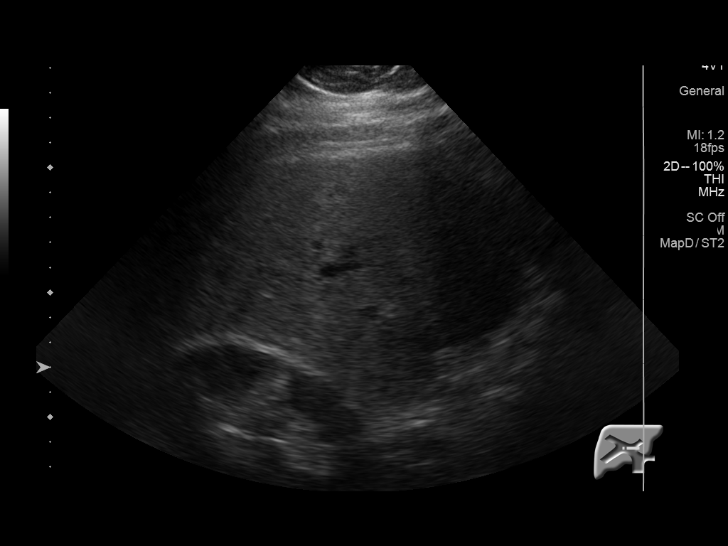
[im 31/93]
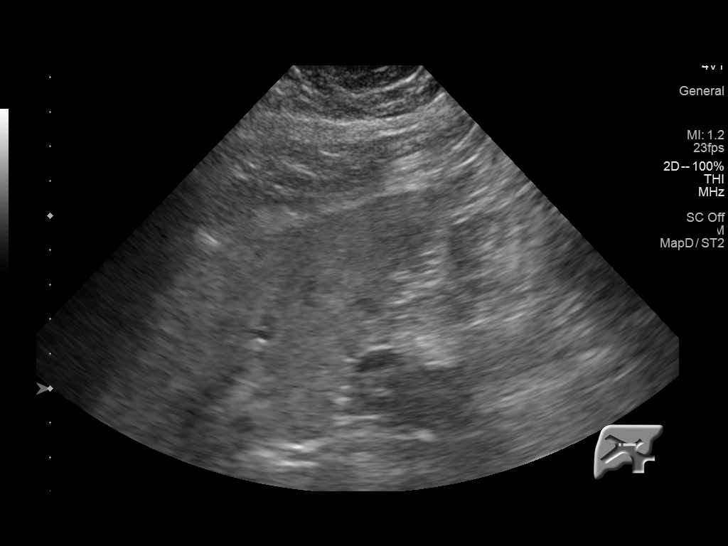
[im 39/93]
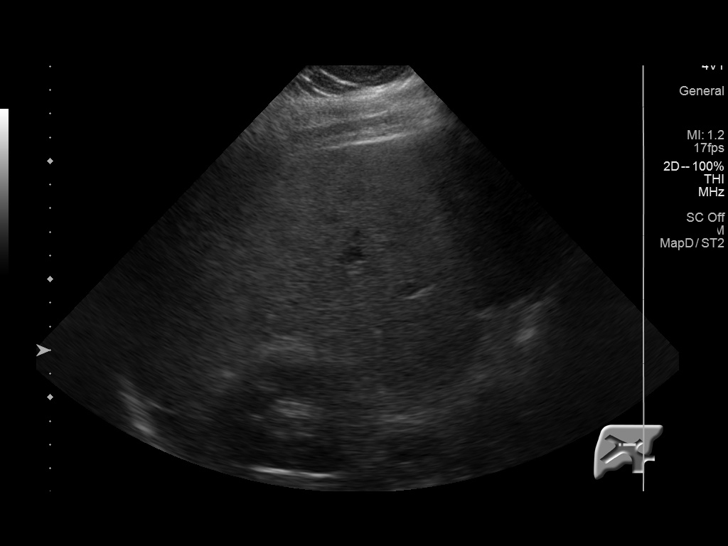
[im 47/93]
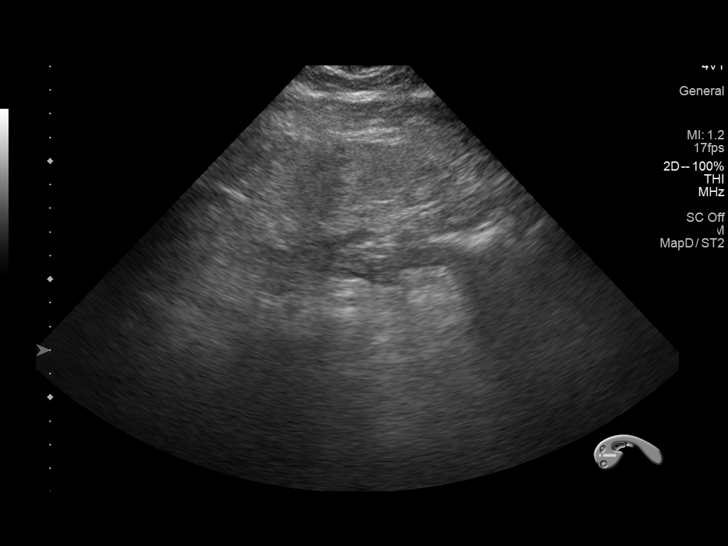
[im 54/93]
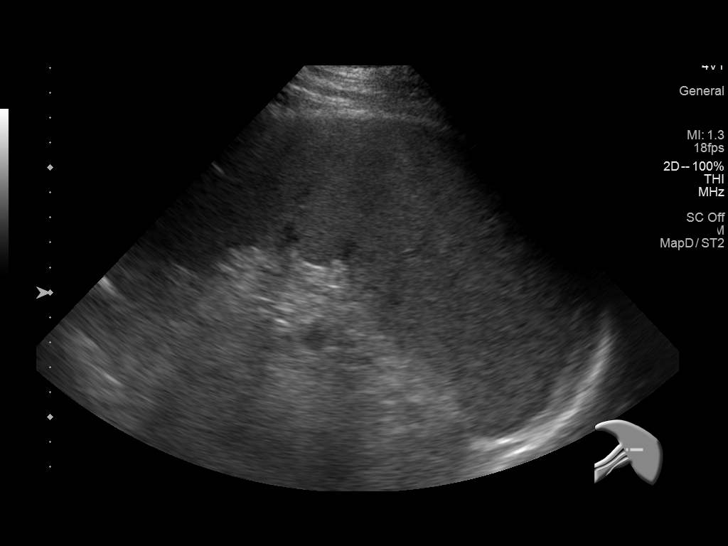
[im 62/93]
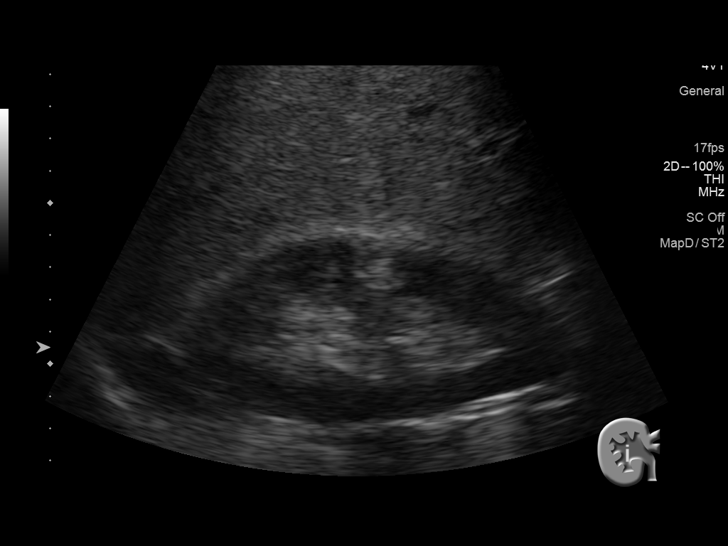
[im 70/93]
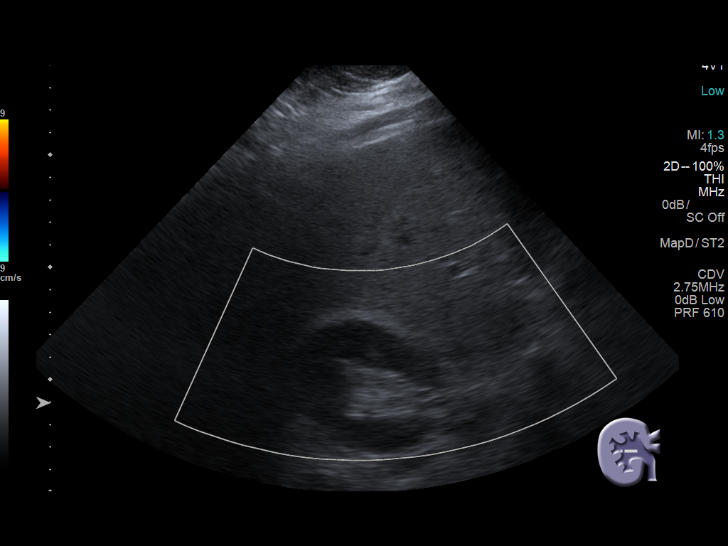
[im 77/93]
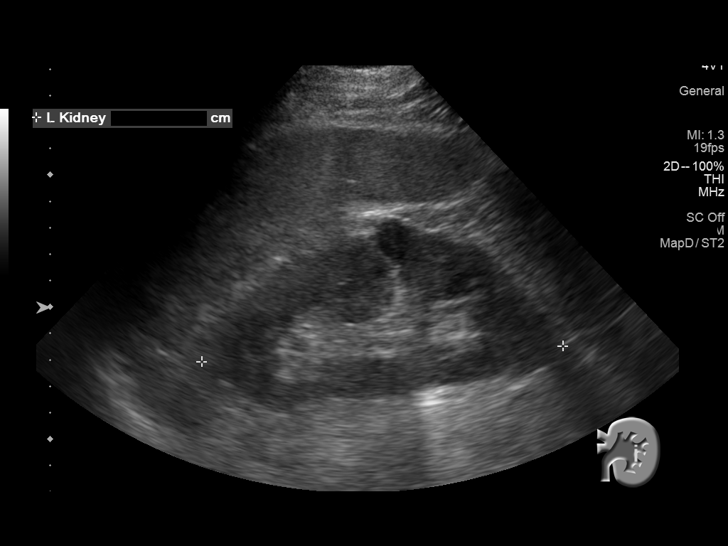
[im 85/93]
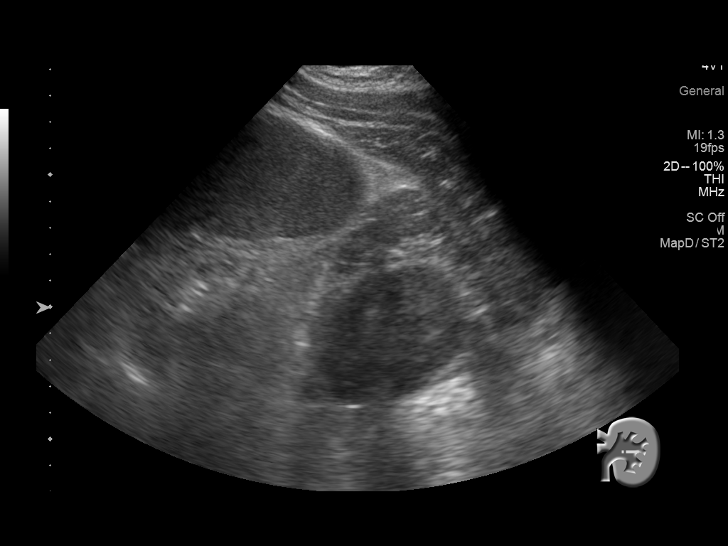
[im 93/93]
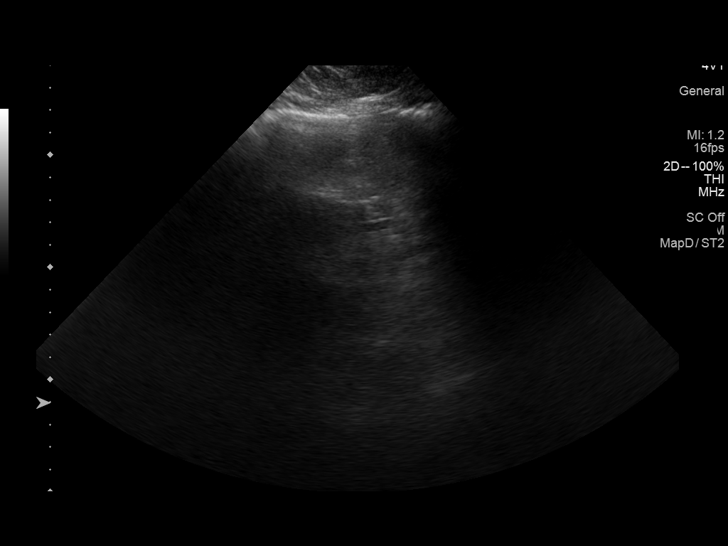

[13 of 25 positions shown; findings below may reference images not displayed]

FINDINGS: Gallbladder: Surgically absent

Common bile duct: Diameter: 3 mm

Liver: Nodular in contour. Increased in echogenicity. No focal
lesion identified.

IVC: No abnormality visualized.

Pancreas: Visualized portion unremarkable.

Spleen: Enlarged measuring 16.5 cm. Splenic volume of 1,434 cubic cm

Right Kidney: Length: 12.5 cm. Normal renal cortical thickness and
echogenicity. No hydronephrosis. Within the interpolar region of the
right kidney there is a 1.7 x 1.3 x 1.4 cm predominantly echogenic
mass.

Left Kidney: Length: 13.7 cm. Echogenicity within normal limits. No
mass or hydronephrosis visualized. There is a 1.8 cm exophytic cyst.

Abdominal aorta: No aneurysm visualized.

Other findings: None.
IMPRESSION: Predominantly echogenic mass within the interpolar region of the
right kidney. This may correspond with previously identified
angiomyolipoma within the interpolar region of the right kidney.
Recommend follow-up ultrasound in 6 months to assess for stability.

Nodular contour of the liver suggestive of hepatic cirrhosis.
Increased echogenicity most compatible with associated fatty
infiltration.

Splenomegaly.

These results will be called to the ordering clinician or
representative by the Radiologist Assistant, and communication
documented in the PACS or zVision Dashboard.

## 2019-02-05 ENCOUNTER — Other Ambulatory Visit: Payer: Self-pay

## 2019-02-05 ENCOUNTER — Ambulatory Visit (INDEPENDENT_AMBULATORY_CARE_PROVIDER_SITE_OTHER): Payer: Medicare PPO | Admitting: Orthopaedic Surgery

## 2019-02-05 ENCOUNTER — Ambulatory Visit (INDEPENDENT_AMBULATORY_CARE_PROVIDER_SITE_OTHER): Payer: Medicare PPO

## 2019-02-05 ENCOUNTER — Encounter: Payer: Self-pay | Admitting: Orthopaedic Surgery

## 2019-02-05 VITALS — Ht 74.0 in | Wt 325.0 lb

## 2019-02-05 DIAGNOSIS — M25562 Pain in left knee: Secondary | ICD-10-CM

## 2019-02-05 DIAGNOSIS — M659 Synovitis and tenosynovitis, unspecified: Secondary | ICD-10-CM | POA: Diagnosis not present

## 2019-02-05 MED ORDER — BUPIVACAINE HCL 0.5 % IJ SOLN
3.0000 mL | INTRAMUSCULAR | Status: AC | PRN
Start: 2019-02-05 — End: 2019-02-05
  Administered 2019-02-05: 3 mL via INTRA_ARTICULAR

## 2019-02-05 MED ORDER — LIDOCAINE HCL 1 % IJ SOLN
0.5000 mL | INTRAMUSCULAR | Status: AC | PRN
  Administered 2019-02-05: .5 mL

## 2019-02-05 MED ORDER — METHYLPREDNISOLONE ACETATE 40 MG/ML IJ SUSP
40.0000 mg | INTRAMUSCULAR | Status: AC | PRN
  Administered 2019-02-05: 40 mg via INTRA_ARTICULAR

## 2019-02-05 NOTE — Progress Notes (Signed)
Office Visit Note   Patient: Billy Davis           Date of Birth: [birth year]           MRN: [id number] Visit Date: 02/05/2019              Requested by: Arsenio Katz, NP [place],  [place] [address] PCP: Arsenio Katz, NP   Assessment & Plan: Visit Diagnoses:  1. Acute pain of left knee     Plan:injection performed left knee . ROV if he has persistant problems.   Follow-Up Instructions: No follow-ups on file.   Orders:  Orders Placed This Encounter  Procedures  . XR KNEE 3 VIEW LEFT   No orders of the defined types were placed in this encounter.     Procedures: Large Joint Inj: L knee on 02/05/2019 9:16 AM Indications: joint swelling and pain Details: 22 G 1.5 in needle, anterolateral approach  Arthrogram: No  Medications: 0.5 mL lidocaine 1 %; 3 mL bupivacaine 0.5 %; 40 mg methylPREDNISolone acetate 40 MG/ML Outcome: tolerated well, no immediate complications Procedure, treatment alternatives, risks and benefits explained, specific risks discussed. Consent was given by the patient. Immediately prior to procedure a time out was called to verify the correct patient, procedure, equipment, support staff and site/side marked as required. Patient was prepped and draped in the usual sterile fashion.       Clinical Data: No additional findings.   Subjective: Chief Complaint  Patient presents with  . Left Knee - Pain    HPI 63 year old male seen with left knee pain which is a new problem is had pain for about 5 days he rode in a car for extended period of time and then developed a cramp in his leg try to jerk his leg and then had an onset of left knee pain with persistent medial joint line pain since that time.  He had pain swelling his knee ambulatory with a limp he is used a cane.  Previous lumbar surgery with decompression performed and has had some ongoing back issues.  He had some oxycodone leftover.  He has noted tightness in his left knee when he tries  to bend it he has been amatory with a limp.  He does have diabetes is on insulin checks her sugar.  Last A1c was around 8 and is trying to do better with his diabetes.  Review of Systems 14 point review of systems updated unchanged from 10/02/2018 office visit.  Of note is the diabetes insulin-dependent previous lumbar surgery.   Objective: Vital Signs: Ht 6' 2"  (1.88 m)   Wt (!) 325 lb (147.4 kg)   BMI 41.73 kg/m   Physical Exam Constitutional:      Appearance: He is well-developed.  HENT:     Head: Normocephalic and atraumatic.  Eyes:     Pupils: Pupils are equal, round, and reactive to light.  Neck:     Thyroid: No thyromegaly.     Trachea: No tracheal deviation.  Cardiovascular:     Rate and Rhythm: Normal rate.  Pulmonary:     Effort: Pulmonary effort is normal.     Breath sounds: No wheezing.  Abdominal:     General: Bowel sounds are normal.     Palpations: Abdomen is soft.  Skin:    General: Skin is warm and dry.     Capillary Refill: Capillary refill takes less than 2 seconds.  Neurological:     Mental Status: He is  alert and oriented to person, place, and time.  Psychiatric:        Behavior: Behavior normal.        Thought Content: Thought content normal.        Judgment: Judgment normal.     Ortho Exam patient has mild knee effusion collateral ligaments are stable there is crepitus with knee range of motion is full extension knee feels tight at 120 degrees.  He has some pitting edema of the lower extremities.   Specialty Comments:  No specialty comments available.  Imaging: No results found.   PMFS History: Patient Active Problem List   Diagnosis Date Noted  . Other cirrhosis of liver ([place]) 10/29/2017  . Unilateral primary osteoarthritis, right knee 10/24/2017  . Rectal bleeding 06/06/2017  . 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 DM (diabetes mellitus) ([place]) 11/09/2013  . Unspecified diastolic heart failure   . Morbid  obesity ([place])   . IBS (irritable bowel syndrome)   . Ejection fraction   . CAD, multiple vessel   . Contrast media allergy   . Aneurysm ([place])   . Preop cardiovascular exam   . Prostate cancer ([place]) 03/24/2012  . Diastolic dysfunction 85/27/7824  . GERD (gastroesophageal reflux disease) 12/27/2010  . Hypertension 12/27/2010  . DYSLIPIDEMIA 12/26/2009  . FATTY LIVER DISEASE 12/26/2009   Past Medical History:  Diagnosis Date  . Aneurysm ([place])    right femoral pseudoaneurysm, thrombosed (following 03/20/07 cardiac cath)  . CAD, multiple vessel    PCI circumflex 2008, diffuse disease of the right and LAD treated medically.,  //   Nuclear 2010 no ischemia   //   nuclear May, 2012 no ischemia  . Cirrhosis of liver without mention of alcohol   . Contrast media allergy    pt states he is not allergy to contrast  . Diabetes mellitus without complication ([place])    type 2  . Diverticulosis   . Dysphagia   . Ejection fraction    EF 55%, nuclear, May, 2012  . Esophageal reflux   . IBS (irritable bowel syndrome)   . Lower abdominal pain   . Mixed dyslipidemia   . Morbid obesity ([place])   . Other chronic nonalcoholic liver disease   . Postsurgical percutaneous transluminal coronary angioplasty status   . Preop cardiovascular exam    Cardiovascular clearance for prostate surgery October, 2013  . Prostate cancer ([place]) 03/24/12  . Unspecified diastolic heart failure   . Unspecified essential hypertension     Family History  Problem Relation Age of Onset  . Heart disease Mother   . Breast cancer Mother   . Heart disease Father   . Breast cancer Sister   . Ovarian cancer Sister   . Heart disease Brother   . Healthy Daughter   . Healthy Daughter   . Healthy Daughter   . Healthy Daughter   . Coronary artery disease Unknown        family history    Past Surgical History:  Procedure Laterality Date  . BACK SURGERY     took cyst out of back  . CARDIAC CATHETERIZATION    . CARPAL  TUNNEL RELEASE Bilateral   . CHOLECYSTECTOMY  06/1998  . COLONOSCOPY  03/03/03  . COLONOSCOPY  08/17/2011   Procedure: COLONOSCOPY;  Surgeon: Rogene Houston, MD;  [facility];  Service: Endoscopy;  Laterality: N/A;  1045  . COLONOSCOPY N/A 07/05/2017   Procedure: COLONOSCOPY;  Surgeon: Rogene Houston, MD;  [facility];  Service: Endoscopy;  Laterality: N/A;  2:00  . ESOPHAGOGASTRODUODENOSCOPY (EGD) WITH PROPOFOL N/A 12/20/2017   Procedure: ESOPHAGOGASTRODUODENOSCOPY (EGD) WITH PROPOFOL;  Surgeon: Rogene Houston, MD;  [facility];  Service: Endoscopy;  Laterality: N/A;  11:10 :  . LEFT HEART CATHETERIZATION WITH CORONARY ANGIOGRAM N/A 12/07/2013   Procedure: LEFT HEART CATHETERIZATION WITH CORONARY ANGIOGRAM;  Surgeon: Peter M Martinique, MD;  Location[facility] CATH LAB;  Service: Cardiovascular;  Laterality: N/A;  . left rotator    . POLYPECTOMY  12/20/2017   Procedure: POLYPECTOMY;  Surgeon: Rogene Houston, MD;  [facility];  Service: Endoscopy;;  Gastric (HS) x2  . RIGHT KNEE SURGERY    . ROBOT ASSISTED LAPAROSCOPIC RADICAL PROSTATECTOMY  06/26/2012   Procedure: ROBOTIC ASSISTED LAPAROSCOPIC RADICAL PROSTATECTOMY LEVEL 3;  Surgeon: Dutch Gray, MD;  [facility];  Service: Urology;  Laterality: N/A;      . Two cardiac stents  2009  . UPPER GASTROINTESTINAL ENDOSCOPY  02/21/2011   EGD ED  . UPPER GASTROINTESTINAL ENDOSCOPY  03/03/03   TCS  . VASECTOMY     Social History   Occupational History  . Occupation: [employer]  Tobacco Use  . Smoking status: Former Smoker    Packs/day: 3.00    Years: 30.00    Pack years: 90.00    Types: Cigarettes    Quit date: 08/27/1998    Years since quitting: 20.4  . Smokeless tobacco: Never Used  . Tobacco comment: Pt quit smoking 10 yrs ago, smoked for about 30 yrs.  Substance and Sexual Activity  . Alcohol use: No    Alcohol/week: 0.0 standard drinks  . Drug use: No  . Sexual activity: Not on file

## 2019-03-20 DIAGNOSIS — K7460 Unspecified cirrhosis of liver: Secondary | ICD-10-CM | POA: Diagnosis not present

## 2019-03-20 DIAGNOSIS — Z299 Encounter for prophylactic measures, unspecified: Secondary | ICD-10-CM | POA: Diagnosis not present

## 2019-03-20 DIAGNOSIS — I10 Essential (primary) hypertension: Secondary | ICD-10-CM | POA: Diagnosis not present

## 2019-03-20 DIAGNOSIS — Z6841 Body Mass Index (BMI) 40.0 and over, adult: Secondary | ICD-10-CM | POA: Diagnosis not present

## 2019-03-20 DIAGNOSIS — Z713 Dietary counseling and surveillance: Secondary | ICD-10-CM | POA: Diagnosis not present

## 2019-03-30 NOTE — Progress Notes (Signed)
Subjective:    Patient ID: Billy Davis, male    DOB: 05-Jul-1956, 63 y.o.   MRN: [id number]  HPI  Billy Davis is a 63 y.o. 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  He presents today for cirrhosis follow up. He had right lower back pain 1 1/2 weeks ago and he was concerned this pain was due to having cirrhosis. His right lower back pain worsened with activity. He reduced his soda intake from 4 to 5 cans to 1 can daily and his back pain resolved a few days later. No alcohol intake. Infrequently takes Ibuprofen 469m PRN. GERD is under control on Protonix. No dysphagia or heartburn. He is passing normal bowel movements daily, no rectal bleeding or melena.   EGD 12/20/2017:  Normal proximal esophagus and mid esophagus, grade I esophageal varices, mild portal hypertensive gastropathy was found in the gastric fundus, in the gastric body and in the gastric antrum. Two 6 to 12 mm semi-sessile polyps with no stigmata of recent bleeding were found in the gastric antrum. The polyp was removed with a hot snare. Resection and retrieval were complete. To stop active bleeding, one hemostatic clip was successfully placed. There was no bleeding at the end of the procedure the duodenum was normal. Repeat EGD in 1 year recommended.  Colonoscopy 07/05/2017: Diverticulosis in the sigmoid colon and in the cecum and internal and internal hemorrhoids.  12/24/2016 abdominal MRI: No focal hepatic lesion. No hepatic steatosis. No biliary duct dilatation postcholecystectomy. Liver has a nodular appearance with enlargement of the caudate lobe consistent with cirrhosis. Normal pancreas and spleen. Bosniak 1 and 2 renal cysts of the left and right kidneys.      Component Value Date/Time   NA 136 12/16/2017 1159   NA 140 01/30/2011   K 3.4 (L) 12/16/2017 1159   CL  101 12/16/2017 1159   CO2 26 12/16/2017 1159   GLUCOSE 183 (H) 12/16/2017 1159   BUN 18 12/16/2017 1159   CREATININE 0.80 12/16/2017 1159   CREATININE 0.86 10/29/2017 1018   CALCIUM 9.6 12/16/2017 1159   PROT 7.2 06/05/2017 1553   ALBUMIN 4.3 07/05/2016 0846   AST 26 06/05/2017 1553   ALT 32 06/05/2017 1553   ALKPHOS 54 07/05/2016 0846   BILITOT 0.5 06/05/2017 1553   GFRNONAA >60 12/16/2017 1159   GFRAA >60 12/16/2017 1159    CBC Latest Ref Rng & Units 12/16/2017 10/29/2017 06/05/2017  WBC 4.0 - 10.5 K/uL 8.9 7.8 7.8  Hemoglobin 13.0 - 17.0 g/dL 15.1 15.3 14.8  Hematocrit 39.0 - 52.0 % 45.3 45.1 44.6  Platelets 150 - 400 K/uL 143(L) 156 141   Lab Results  Component Value Date   AFPTM 2.5 10/29/2017   AFPTM 2.4 11/08/2016   AFPTM 3.2 11/09/2013    Past Medical History:  Diagnosis Date  . [place]    right femoral pseudoaneurysm, thrombosed (following 03/20/07 cardiac cath)  . CAD, multiple vessel    PCI circumflex 2008, diffuse disease of the right and LAD treated medically.,  //   Nuclear 2010 no ischemia   //   nuclear May, 2012 no ischemia  . Cirrhosis of liver without mention of alcohol   . Contrast media allergy    pt states he is not allergy to contrast  . Diabetes [place] ([place]  type 2  . Diverticulosis   . Dysphagia   . Ejection fraction    EF 55%, nuclear, May, 2012  . Esophageal reflux   . IBS (irritable bowel syndrome)   . Lower abdominal pain   . Mixed dyslipidemia   . Morbid obesity ([place])   . Other chronic nonalcoholic liver disease   . Postsurgical percutaneous transluminal coronary angioplasty status   . Preop cardiovascular exam    Cardiovascular clearance for prostate surgery October, 2013  . Prostate cancer ([place]) 03/24/12  . Unspecified diastolic heart failure   . Unspecified essential hypertension    Current Outpatient Medications on File Prior to Visit  Medication Sig Dispense Refill  . amLODipine (NORVASC) 5 MG  tablet Take 5 mg by mouth daily.    Marland Kitchen aspirin EC 81 MG tablet Take 1 tablet (81 mg total) by mouth daily.    . chlorthalidone (HYGROTON) 25 MG tablet TAKE 1 TABLET DAILY (Patient not taking: Reported on 10/02/2018) 30 tablet 0  . dicyclomine (BENTYL) 10 MG capsule TAKE 1 CAPSULE DAILY (Patient not taking: Reported on 10/02/2018) 60 capsule 6  . ibuprofen (ADVIL,MOTRIN) 200 MG tablet Take 400 mg by mouth every 8 (eight) hours as needed for mild pain or moderate pain.    Marland Kitchen insulin glargine (LANTUS) 100 unit/mL SOPN Inject 80 Units into the skin at bedtime.    Marland Kitchen lisinopril (PRINIVIL,ZESTRIL) 40 MG tablet Take 40 mg by mouth every morning.     . metoprolol (LOPRESSOR) 50 MG tablet Take 50 mg by mouth daily.    . Multiple Vitamin (MULTI VITAMIN MENS PO) Take 1 tablet by mouth daily.     Marland Kitchen NITROSTAT 0.4 MG SL tablet Place 1 tablet under the tongue every 5 (five) minutes as needed. For chest pains    . pantoprazole (PROTONIX) 40 MG tablet Take 40 mg by mouth daily.    . potassium chloride SA (K-DUR,KLOR-CON) 20 MEQ tablet Take 1 tablet (20 mEq total) by mouth daily. (Patient not taking: Reported on 10/02/2018) 30 tablet 2  . spironolactone (ALDACTONE) 25 MG tablet Take 25 mg by mouth daily. Patient is not taking     No current facility-administered medications on file prior to visit.    Allergies  Allergen Reactions  . Codeine Palpitations  . Plavix [Clopidogrel Bisulfate] Rash   Review of Systems  See HPI, + back pain, currently resolved, all other systems reviewed and are negative     Objective:   Physical Exam  Blood pressure (!) 180/99, pulse 84, temperature 98.4 F (36.9 C), resp. rate 20, height 6' 2"  (1.88 m), weight (!) 317 lb (143.8 kg). BMI 40.7 General: obese male in NAD Eyes: sclera non-icteric, conjunctiva pink Mouth: dentition intact, no ulcers Neck: supple, no thyromegaly or lymphadenopathy Heart: RRR, no murmur Lungs: clear throughout Abdomen: soft, nontender, no  hepatosplenomegaly appreciated in setting of obese abdomen, + BS x 4 quads. Rectal: defferred Extremities: RLE 2+ edema, LLE 1+ edema Neuro: alert and oriented x 4, right leg weakness (chronic secondary to past back surgery).     Assessment & Plan:   1. 63 y.o. male with NASH Cirrhosis -CBC, CMP, AFP -abdominal sonogram   2.Esophageal varices -EGD to assess esophageal varices  -Cardiac clearance required prior to proceeding with EGD  3. CAD/stent/diastolic heart failure. Patient has not seen a cardiologist in > 2 years. He stopped Lasix and Spironolactone ? 6 months ago presents with bilateral lower extremity edema and elevated BP 180/99.  -patient advised to  schedule a cardiology consult for medication management and cardiac clearance prior to EGD  4. GERD stable on Protonix  5. Irritable Bowel Syndrome, stable -Dicyclomine 24m one tab po bid PRN

## 2019-03-31 ENCOUNTER — Encounter (INDEPENDENT_AMBULATORY_CARE_PROVIDER_SITE_OTHER): Payer: Self-pay | Admitting: Nurse Practitioner

## 2019-03-31 ENCOUNTER — Other Ambulatory Visit: Payer: Self-pay

## 2019-03-31 ENCOUNTER — Encounter (INDEPENDENT_AMBULATORY_CARE_PROVIDER_SITE_OTHER): Payer: Self-pay | Admitting: *Deleted

## 2019-03-31 ENCOUNTER — Ambulatory Visit (INDEPENDENT_AMBULATORY_CARE_PROVIDER_SITE_OTHER): Payer: Medicare PPO | Admitting: Nurse Practitioner

## 2019-03-31 VITALS — BP 180/99 | HR 84 | Temp 98.4°F | Resp 20 | Ht 74.0 in | Wt 317.0 lb

## 2019-03-31 DIAGNOSIS — K7469 Other cirrhosis of liver: Secondary | ICD-10-CM

## 2019-03-31 NOTE — Patient Instructions (Addendum)
1. Complete the provide lab order  2. Schedule an abdominal ultrasound  3. You will need to schedule an appointment with your cardiologist for cardiac clearance prior to scheduling an upper endoscopy (EGD) with Dr. Laural Golden.   4. Continue Protonix 53m once daily as previously prescribed   5. Call our office if your abdominal pain recurs

## 2019-04-01 ENCOUNTER — Ambulatory Visit (HOSPITAL_COMMUNITY)
Admission: RE | Admit: 2019-04-01 | Discharge: 2019-04-01 | Disposition: A | Discharge status: Home / Self Care | Payer: Medicare PPO | Source: Ambulatory Visit | Attending: Nurse Practitioner | Admitting: Nurse Practitioner

## 2019-04-01 DIAGNOSIS — K7469 Other cirrhosis of liver: Secondary | ICD-10-CM | POA: Insufficient documentation

## 2019-04-01 DIAGNOSIS — K7460 Unspecified cirrhosis of liver: Secondary | ICD-10-CM | POA: Diagnosis not present

## 2019-04-01 LAB — AFP TUMOR MARKER: AFP-Tumor Marker: 2.2 ng/mL (ref <6.1)

## 2019-04-01 LAB — CBC
HCT: 43.5 % (ref 38.5–50.0)
Hemoglobin: 14.6 g/dL (ref 13.2–17.1)
MCH: 27.4 pg (ref 27.0–33.0)
MCHC: 33.6 g/dL (ref 32.0–36.0)
MCV: 81.8 fL (ref 80.0–100.0)
MPV: 12.7 fL — ABNORMAL HIGH (ref 7.5–12.5)
Platelets: 151 10*3/uL (ref 140–400)
RBC: 5.32 10*6/uL (ref 4.20–5.80)
RDW: 14.3 % (ref 11.0–15.0)
WBC: 5.8 10*3/uL (ref 3.8–10.8)

## 2019-04-01 LAB — COMPLETE METABOLIC PANEL WITH GFR
AG Ratio: 1.5 (calc) (ref 1.0–2.5)
ALT: 37 U/L (ref 9–46)
AST: 32 U/L (ref 10–35)
Albumin: 4.2 g/dL (ref 3.6–5.1)
Alkaline phosphatase (APISO): 93 U/L (ref 35–144)
BUN: 9 mg/dL (ref 7–25)
CO2: 25 mmol/L (ref 20–32)
Calcium: 9.3 mg/dL (ref 8.6–10.3)
Chloride: 104 mmol/L (ref 98–110)
Creat: 0.76 mg/dL (ref 0.70–1.25)
GFR, Est African American: 113 mL/min/{1.73_m2} (ref >=60)
GFR, Est Non African American: 98 mL/min/{1.73_m2} (ref >=60)
Globulin: 2.8 g/dL (calc) (ref 1.9–3.7)
Glucose, Bld: 181 mg/dL — ABNORMAL HIGH (ref 65–139)
Potassium: 3.8 mmol/L (ref 3.5–5.3)
Sodium: 139 mmol/L (ref 135–146)
Total Bilirubin: 1 mg/dL (ref 0.2–1.2)
Total Protein: 7 g/dL (ref 6.1–8.1)

## 2019-04-16 ENCOUNTER — Ambulatory Visit: Payer: Medicare PPO | Admitting: Orthopaedic Surgery

## 2019-04-16 ENCOUNTER — Other Ambulatory Visit: Payer: Self-pay

## 2019-04-26 DIAGNOSIS — H9190 Unspecified hearing loss, unspecified ear: Secondary | ICD-10-CM | POA: Diagnosis not present

## 2019-04-26 DIAGNOSIS — R0789 Other chest pain: Secondary | ICD-10-CM | POA: Diagnosis not present

## 2019-04-26 DIAGNOSIS — I10 Essential (primary) hypertension: Secondary | ICD-10-CM | POA: Diagnosis not present

## 2019-04-26 DIAGNOSIS — E119 Type 2 diabetes mellitus without complications: Secondary | ICD-10-CM | POA: Diagnosis not present

## 2019-04-26 DIAGNOSIS — Z6841 Body Mass Index (BMI) 40.0 and over, adult: Secondary | ICD-10-CM | POA: Diagnosis not present

## 2019-04-26 DIAGNOSIS — R079 Chest pain, unspecified: Secondary | ICD-10-CM | POA: Diagnosis not present

## 2019-04-26 DIAGNOSIS — Z955 Presence of coronary angioplasty implant and graft: Secondary | ICD-10-CM | POA: Diagnosis not present

## 2019-04-26 DIAGNOSIS — K7460 Unspecified cirrhosis of liver: Secondary | ICD-10-CM | POA: Diagnosis not present

## 2019-04-26 DIAGNOSIS — I2510 Atherosclerotic heart disease of native coronary artery without angina pectoris: Secondary | ICD-10-CM | POA: Diagnosis not present

## 2019-04-27 DIAGNOSIS — Z955 Presence of coronary angioplasty implant and graft: Secondary | ICD-10-CM | POA: Diagnosis not present

## 2019-04-27 DIAGNOSIS — I10 Essential (primary) hypertension: Secondary | ICD-10-CM | POA: Diagnosis not present

## 2019-04-27 DIAGNOSIS — E119 Type 2 diabetes mellitus without complications: Secondary | ICD-10-CM | POA: Diagnosis not present

## 2019-04-27 DIAGNOSIS — R0600 Dyspnea, unspecified: Secondary | ICD-10-CM | POA: Diagnosis not present

## 2019-04-27 DIAGNOSIS — R079 Chest pain, unspecified: Secondary | ICD-10-CM | POA: Diagnosis not present

## 2019-04-27 DIAGNOSIS — I2510 Atherosclerotic heart disease of native coronary artery without angina pectoris: Secondary | ICD-10-CM | POA: Diagnosis not present

## 2019-04-27 DIAGNOSIS — R600 Localized edema: Secondary | ICD-10-CM | POA: Diagnosis not present

## 2019-05-01 ENCOUNTER — Ambulatory Visit: Payer: Medicare PPO | Admitting: Internal Medicine

## 2019-05-05 DIAGNOSIS — Z299 Encounter for prophylactic measures, unspecified: Secondary | ICD-10-CM | POA: Diagnosis not present

## 2019-05-05 DIAGNOSIS — E7800 Pure hypercholesterolemia, unspecified: Secondary | ICD-10-CM | POA: Diagnosis not present

## 2019-05-05 DIAGNOSIS — R079 Chest pain, unspecified: Secondary | ICD-10-CM | POA: Diagnosis not present

## 2019-05-05 DIAGNOSIS — E1165 Type 2 diabetes mellitus with hyperglycemia: Secondary | ICD-10-CM | POA: Diagnosis not present

## 2019-05-05 DIAGNOSIS — Z6841 Body Mass Index (BMI) 40.0 and over, adult: Secondary | ICD-10-CM | POA: Diagnosis not present

## 2019-05-05 DIAGNOSIS — I10 Essential (primary) hypertension: Secondary | ICD-10-CM | POA: Diagnosis not present

## 2019-05-07 ENCOUNTER — Ambulatory Visit (INDEPENDENT_AMBULATORY_CARE_PROVIDER_SITE_OTHER): Payer: Medicare PPO | Admitting: Orthopaedic Surgery

## 2019-05-07 ENCOUNTER — Encounter: Payer: Self-pay | Admitting: Orthopaedic Surgery

## 2019-05-07 ENCOUNTER — Ambulatory Visit (INDEPENDENT_AMBULATORY_CARE_PROVIDER_SITE_OTHER): Payer: Medicare PPO

## 2019-05-07 ENCOUNTER — Other Ambulatory Visit: Payer: Self-pay

## 2019-05-07 VITALS — BP 181/102 | HR 70 | Ht 74.0 in | Wt 318.0 lb

## 2019-05-07 DIAGNOSIS — G8929 Other chronic pain: Secondary | ICD-10-CM

## 2019-05-07 DIAGNOSIS — R079 Chest pain, unspecified: Secondary | ICD-10-CM | POA: Diagnosis not present

## 2019-05-07 DIAGNOSIS — M25512 Pain in left shoulder: Secondary | ICD-10-CM | POA: Diagnosis not present

## 2019-05-07 MED ORDER — LIDOCAINE HCL 1 % IJ SOLN
0.5000 mL | INTRAMUSCULAR | Status: AC | PRN
  Administered 2019-05-07: .5 mL

## 2019-05-07 MED ORDER — BUPIVACAINE HCL 0.25 % IJ SOLN
4.0000 mL | INTRAMUSCULAR | Status: AC | PRN
  Administered 2019-05-07: 4 mL via INTRA_ARTICULAR

## 2019-05-07 MED ORDER — METHYLPREDNISOLONE ACETATE 40 MG/ML IJ SUSP
40.0000 mg | INTRAMUSCULAR | Status: AC | PRN
  Administered 2019-05-07: 40 mg via INTRA_ARTICULAR

## 2019-05-07 NOTE — Progress Notes (Signed)
Office Visit Note   Patient: Billy Davis           Date of Birth: [birth year]           MRN: [id number] Visit Date: 05/07/2019              Requested by: Arsenio Katz, NP [place],  [place] [id number] PCP: Arsenio Katz, NP   Assessment & Plan: Visit Diagnoses:  1. Chronic left shoulder pain     Plan: Subacromial injection performed with good relief.  Watch sugars carefully bump his insulin up to cover this if he is having persistent problems he will call let us know we can proceed with MRI scan of his shoulder.  Follow-Up Instructions: No follow-ups on file.   Orders:  Orders Placed This Encounter  Procedures  . XR Shoulder Left   No orders of the defined types were placed in this encounter.     Procedures: Large Joint Inj: L subacromial bursa on 05/07/2019 10:31 AM Indications: pain Details: 22 G 1.5 in needle  Arthrogram: No  Medications: 4 mL bupivacaine 0.25 %; 40 mg methylPREDNISolone acetate 40 MG/ML; 0.5 mL lidocaine 1 % Outcome: tolerated well, no immediate complications Procedure, treatment alternatives, risks and benefits explained, specific risks discussed. Consent was given by the patient. Immediately prior to procedure a time out was called to verify the correct patient, procedure, equipment, support staff and site/side marked as required. Patient was prepped and draped in the usual sterile fashion.       Clinical Data: No additional findings.   Subjective: Chief Complaint  Patient presents with  . Left Shoulder - Pain    HPI 63 year old male with previous biceps tenodesis left shoulder about 5 years ago has had increased pain in his left shoulder after fall a year ago.  He has had pain with outstretch reaching overhead activity.  No numbness or tingling in his hand he denies neck pain no chills or fever. Review of Systems positive for diabetes obesity hyperlipidemia, lumbar spondylolisthesis, prostate cancer, IBS, hypertension otherwise  negative is obtains HPI.   Objective: Vital Signs: BP (!) 181/102   Pulse 70   Ht 6' 2"  (1.88 m)   Wt (!) 318 lb (144.2 kg)   BMI 40.83 kg/m   Physical Exam Constitutional:      Appearance: He is well-developed.  HENT:     Head: Normocephalic and atraumatic.  Eyes:     Pupils: Pupils are equal, round, and reactive to light.  Neck:     Thyroid: No thyromegaly.     Trachea: No tracheal deviation.  Cardiovascular:     Rate and Rhythm: Normal rate.  Pulmonary:     Effort: Pulmonary effort is normal.     Breath sounds: No wheezing.  Abdominal:     General: Bowel sounds are normal.     Palpations: Abdomen is soft.  Skin:    General: Skin is warm and dry.     Capillary Refill: Capillary refill takes less than 2 seconds.  Neurological:     Mental Status: He is alert and oriented to person, place, and time.  Psychiatric:        Behavior: Behavior normal.        Thought Content: Thought content normal.        Judgment: Judgment normal.     Ortho Exam negative Spurling no brachial plexus tenderness.  Positive impingement left shoulder.  Well-healed anterior incision from tenodesis long head biceps with no  distal biceps muscle migration.  Reflexes are 2+ mild tenderness over the acromioclavicular joint.  Specialty Comments:  No specialty comments available.  Imaging: No results found.   PMFS History: Patient Active Problem List   Diagnosis Date Noted  . Other cirrhosis of liver ([place]) 10/29/2017  . Unilateral primary osteoarthritis, right knee 10/24/2017  . Rectal bleeding 06/06/2017  . 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 DM (diabetes mellitus) ([place]) 11/09/2013  . Unspecified diastolic heart failure   . Morbid obesity ([place])   . IBS (irritable bowel syndrome)   . Ejection fraction   . CAD, multiple vessel   . Contrast media allergy   . Aneurysm ([place])   . Preop cardiovascular exam   . Prostate cancer ([place]) 03/24/2012  . Diastolic  dysfunction 14/43/1540  . GERD (gastroesophageal reflux disease) 12/27/2010  . Hypertension 12/27/2010  . DYSLIPIDEMIA 12/26/2009  . FATTY LIVER DISEASE 12/26/2009   Past Medical History:  Diagnosis Date  . 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 CAD, multiple vessel    PCI circumflex 2008, diffuse disease of the right and LAD treated medically.,  //   Nuclear 2010 no ischemia   //   nuclear May, 2012 no ischemia  . Cirrhosis of liver without mention of alcohol   . Contrast media allergy    pt states he is not allergy to contrast  . Diabetes mellitus without complication ([place])    type 2  . Diverticulosis   . Dysphagia   . Ejection fraction    EF 55%, nuclear, May, 2012  . Esophageal reflux   . IBS (irritable bowel syndrome)   . Lower abdominal pain   . Mixed dyslipidemia   . Morbid obesity ([place])   . Other chronic nonalcoholic liver disease   . Postsurgical percutaneous transluminal coronary angioplasty status   . Preop cardiovascular exam    Cardiovascular clearance for prostate surgery October, 2013  . Prostate cancer ([place]) 03/24/12  . Unspecified diastolic heart failure   . Unspecified essential hypertension     Family History  Problem Relation Age of Onset  . Heart disease Mother   . Breast cancer Mother   . Heart disease Father   . Breast cancer Sister   . Ovarian cancer Sister   . Heart disease Brother   . Healthy Daughter   . Healthy Daughter   . Healthy Daughter   . Healthy Daughter   . Coronary artery disease Unknown        family history    Past Surgical History:  Procedure Laterality Date  . BACK SURGERY     took cyst out of back  . CARDIAC CATHETERIZATION    . CARPAL TUNNEL RELEASE Bilateral   . CHOLECYSTECTOMY  06/1998  . COLONOSCOPY  03/03/03  . COLONOSCOPY  08/17/2011   Procedure: COLONOSCOPY;  Surgeon: Rogene Houston, MD;  [facility];  Service: Endoscopy;  Laterality: N/A;   1045  . COLONOSCOPY N/A 07/05/2017   Procedure: COLONOSCOPY;  Surgeon: Rogene Houston, MD;  [facility];  Service: Endoscopy;  Laterality: N/A;  2:00  . ESOPHAGOGASTRODUODENOSCOPY (EGD) WITH PROPOFOL N/A 12/20/2017   Procedure: ESOPHAGOGASTRODUODENOSCOPY (EGD) WITH PROPOFOL;  Surgeon: Rogene Houston, MD;  [facility];  Service: Endoscopy;  Laterality: N/A;  11:10 :  . LEFT HEART CATHETERIZATION WITH CORONARY ANGIOGRAM N/A 12/07/2013   Procedure: LEFT HEART CATHETERIZATION WITH CORONARY ANGIOGRAM;  Surgeon: Ander Slade  Martinique, MD;  Location: [facility] CATH LAB;  Service: Cardiovascular;  Laterality: N/A;  . left rotator    . POLYPECTOMY  12/20/2017   Procedure: POLYPECTOMY;  Surgeon: Rogene Houston, MD;  [facility];  Service: Endoscopy;;  Gastric (HS) x2  . RIGHT KNEE SURGERY    . ROBOT ASSISTED LAPAROSCOPIC RADICAL PROSTATECTOMY  06/26/2012   Procedure: ROBOTIC ASSISTED LAPAROSCOPIC RADICAL PROSTATECTOMY LEVEL 3;  Surgeon: Dutch Gray, MD;  [facility];  Service: Urology;  Laterality: N/A;      . Two cardiac stents  2009  . UPPER GASTROINTESTINAL ENDOSCOPY  02/21/2011   EGD ED  . UPPER GASTROINTESTINAL ENDOSCOPY  03/03/03   TCS  . VASECTOMY     Social History   Occupational History  . Occupation: [employer]  Tobacco Use  . Smoking status: Former Smoker    Packs/day: 3.00    Years: 30.00    Pack years: 90.00    Types: Cigarettes    Quit date: 08/27/1998    Years since quitting: 20.7  . Smokeless tobacco: Never Used  . Tobacco comment: Pt quit smoking 10 yrs ago, smoked for about 30 yrs.  Substance and Sexual Activity  . Alcohol use: No    Alcohol/week: 0.0 standard drinks  . Drug use: No  . Sexual activity: Not on file

## 2019-05-08 DIAGNOSIS — R079 Chest pain, unspecified: Secondary | ICD-10-CM | POA: Diagnosis not present

## 2019-05-18 DIAGNOSIS — I2510 Atherosclerotic heart disease of native coronary artery without angina pectoris: Secondary | ICD-10-CM | POA: Diagnosis not present

## 2019-05-18 DIAGNOSIS — E785 Hyperlipidemia, unspecified: Secondary | ICD-10-CM | POA: Diagnosis not present

## 2019-05-18 DIAGNOSIS — I10 Essential (primary) hypertension: Secondary | ICD-10-CM | POA: Diagnosis not present

## 2019-05-18 DIAGNOSIS — E119 Type 2 diabetes mellitus without complications: Secondary | ICD-10-CM | POA: Diagnosis not present

## 2019-05-18 DIAGNOSIS — R079 Chest pain, unspecified: Secondary | ICD-10-CM | POA: Diagnosis not present

## 2019-05-26 DIAGNOSIS — E1165 Type 2 diabetes mellitus with hyperglycemia: Secondary | ICD-10-CM | POA: Diagnosis not present

## 2019-05-26 DIAGNOSIS — E7800 Pure hypercholesterolemia, unspecified: Secondary | ICD-10-CM | POA: Diagnosis not present

## 2019-05-26 DIAGNOSIS — Z6841 Body Mass Index (BMI) 40.0 and over, adult: Secondary | ICD-10-CM | POA: Diagnosis not present

## 2019-05-26 DIAGNOSIS — I10 Essential (primary) hypertension: Secondary | ICD-10-CM | POA: Diagnosis not present

## 2019-05-26 DIAGNOSIS — Z299 Encounter for prophylactic measures, unspecified: Secondary | ICD-10-CM | POA: Diagnosis not present

## 2019-06-04 DIAGNOSIS — E1165 Type 2 diabetes mellitus with hyperglycemia: Secondary | ICD-10-CM | POA: Diagnosis not present

## 2019-06-04 DIAGNOSIS — Z7189 Other specified counseling: Secondary | ICD-10-CM | POA: Diagnosis not present

## 2019-06-04 DIAGNOSIS — Z1211 Encounter for screening for malignant neoplasm of colon: Secondary | ICD-10-CM | POA: Diagnosis not present

## 2019-06-04 DIAGNOSIS — Z0000 Encounter for general adult medical examination without abnormal findings: Secondary | ICD-10-CM | POA: Diagnosis not present

## 2019-06-04 DIAGNOSIS — Z1339 Encounter for screening examination for other mental health and behavioral disorders: Secondary | ICD-10-CM | POA: Diagnosis not present

## 2019-06-04 DIAGNOSIS — Z1331 Encounter for screening for depression: Secondary | ICD-10-CM | POA: Diagnosis not present

## 2019-06-04 DIAGNOSIS — R5383 Other fatigue: Secondary | ICD-10-CM | POA: Diagnosis not present

## 2019-06-04 DIAGNOSIS — Z79899 Other long term (current) drug therapy: Secondary | ICD-10-CM | POA: Diagnosis not present

## 2019-06-04 DIAGNOSIS — Z125 Encounter for screening for malignant neoplasm of prostate: Secondary | ICD-10-CM | POA: Diagnosis not present

## 2019-06-04 DIAGNOSIS — E7800 Pure hypercholesterolemia, unspecified: Secondary | ICD-10-CM | POA: Diagnosis not present

## 2019-06-18 DIAGNOSIS — E1142 Type 2 diabetes mellitus with diabetic polyneuropathy: Secondary | ICD-10-CM | POA: Diagnosis not present

## 2019-06-18 DIAGNOSIS — E7800 Pure hypercholesterolemia, unspecified: Secondary | ICD-10-CM | POA: Diagnosis not present

## 2019-06-18 DIAGNOSIS — E785 Hyperlipidemia, unspecified: Secondary | ICD-10-CM | POA: Diagnosis not present

## 2019-06-18 DIAGNOSIS — R079 Chest pain, unspecified: Secondary | ICD-10-CM | POA: Diagnosis not present

## 2019-06-18 DIAGNOSIS — Z794 Long term (current) use of insulin: Secondary | ICD-10-CM | POA: Diagnosis not present

## 2019-06-18 DIAGNOSIS — I2510 Atherosclerotic heart disease of native coronary artery without angina pectoris: Secondary | ICD-10-CM | POA: Diagnosis not present

## 2019-06-18 DIAGNOSIS — I10 Essential (primary) hypertension: Secondary | ICD-10-CM | POA: Diagnosis not present

## 2019-06-18 DIAGNOSIS — I5020 Unspecified systolic (congestive) heart failure: Secondary | ICD-10-CM | POA: Diagnosis not present

## 2019-06-22 ENCOUNTER — Other Ambulatory Visit (INDEPENDENT_AMBULATORY_CARE_PROVIDER_SITE_OTHER): Payer: Self-pay | Admitting: Nurse Practitioner

## 2019-06-22 ENCOUNTER — Other Ambulatory Visit (INDEPENDENT_AMBULATORY_CARE_PROVIDER_SITE_OTHER): Payer: Self-pay | Admitting: *Deleted

## 2019-06-22 ENCOUNTER — Telehealth (INDEPENDENT_AMBULATORY_CARE_PROVIDER_SITE_OTHER): Payer: Self-pay | Admitting: Nurse Practitioner

## 2019-06-22 ENCOUNTER — Encounter (INDEPENDENT_AMBULATORY_CARE_PROVIDER_SITE_OTHER): Payer: Self-pay | Admitting: *Deleted

## 2019-06-22 DIAGNOSIS — K219 Gastro-esophageal reflux disease without esophagitis: Secondary | ICD-10-CM

## 2019-06-22 DIAGNOSIS — I8500 Esophageal varices without bleeding: Secondary | ICD-10-CM | POA: Insufficient documentation

## 2019-06-22 NOTE — Telephone Encounter (Signed)
EGD sch'd 07/31/19, left detailed message for patient, instructions mailed

## 2019-06-22 NOTE — Telephone Encounter (Signed)
Ann pls call patient to schedule an EGD with MAC, cardiac clearance received, patient is to stay on ASA. Pt to take 1/2 does Lantus insulin evening before procedure. I will bring you cardiac clearance by  Dr. Dot Lanes to scan. thx

## 2019-07-16 DIAGNOSIS — I10 Essential (primary) hypertension: Secondary | ICD-10-CM | POA: Diagnosis not present

## 2019-07-29 ENCOUNTER — Other Ambulatory Visit (HOSPITAL_COMMUNITY): Payer: Medicare PPO

## 2019-07-29 ENCOUNTER — Encounter (HOSPITAL_COMMUNITY): Admission: RE | Admit: 2019-07-29 | Payer: Medicare PPO | Source: Ambulatory Visit

## 2019-07-31 ENCOUNTER — Ambulatory Visit (HOSPITAL_COMMUNITY): Admission: RE | Admit: 2019-07-31 | Payer: Medicare PPO | Source: Home / Self Care | Admitting: Internal Medicine

## 2019-07-31 ENCOUNTER — Encounter (HOSPITAL_COMMUNITY): Admission: RE | Payer: Self-pay | Source: Home / Self Care

## 2019-07-31 SURGERY — ESOPHAGOGASTRODUODENOSCOPY (EGD) WITH PROPOFOL
Anesthesia: Monitor Anesthesia Care

## 2019-08-13 ENCOUNTER — Other Ambulatory Visit: Payer: Self-pay

## 2019-08-13 ENCOUNTER — Ambulatory Visit (INDEPENDENT_AMBULATORY_CARE_PROVIDER_SITE_OTHER): Payer: Medicare PPO | Admitting: Orthopaedic Surgery

## 2019-08-13 ENCOUNTER — Encounter: Payer: Self-pay | Admitting: Orthopaedic Surgery

## 2019-08-13 VITALS — BP 161/105 | HR 70 | Ht 74.0 in | Wt 318.0 lb

## 2019-08-13 DIAGNOSIS — Z6841 Body Mass Index (BMI) 40.0 and over, adult: Secondary | ICD-10-CM

## 2019-08-13 DIAGNOSIS — M1712 Unilateral primary osteoarthritis, left knee: Secondary | ICD-10-CM | POA: Diagnosis not present

## 2019-08-13 MED ORDER — BUPIVACAINE HCL 0.5 % IJ SOLN
3.0000 mL | INTRAMUSCULAR | Status: AC | PRN
  Administered 2019-08-13: 09:00:00 3 mL via INTRA_ARTICULAR

## 2019-08-13 MED ORDER — METHYLPREDNISOLONE ACETATE 40 MG/ML IJ SUSP
40.0000 mg | INTRAMUSCULAR | Status: AC | PRN
  Administered 2019-08-13: 40 mg via INTRA_ARTICULAR

## 2019-08-13 MED ORDER — LIDOCAINE HCL 1 % IJ SOLN
0.5000 mL | INTRAMUSCULAR | Status: AC | PRN
  Administered 2019-08-13: .5 mL

## 2019-08-13 NOTE — Progress Notes (Signed)
Office Visit Note   Patient: Billy Davis           Date of Birth: 1956/02/10           MRN: [id number] Visit Date: 08/13/2019              Requested by: Arsenio Katz, NP [place],  [place] [id number] PCP: Arsenio Katz, NP   Assessment & Plan: Visit Diagnoses:  1. Unilateral primary osteoarthritis, left knee     Plan: Aspiration 70 cc yellow clear fluid from the left knee and cortisone Marcaine injection with good relief.  We discussed his morbid obesity needs to get his BMI below 40 since he has progressive osteoarthritis at some point total knee arthroplasty will be considered. The patient meets the AMA guidelines for Morbid (severe) obesity with a BMI > 40.0 and I have recommended weight loss.  Follow-Up Instructions: No follow-ups on file.   Orders:  No orders of the defined types were placed in this encounter.  No orders of the defined types were placed in this encounter.     Procedures: Large Joint Inj: L knee on 08/13/2019 9:23 AM Indications: joint swelling and pain Details: 22 G 1.5 in needle, anterolateral approach  Arthrogram: No  Medications: 0.5 mL lidocaine 1 %; 3 mL bupivacaine 0.5 %; 40 mg methylPREDNISolone acetate 40 MG/ML Aspirate: 70 mL yellow and clear Outcome: tolerated well, no immediate complications Procedure, treatment alternatives, risks and benefits explained, specific risks discussed. Consent was given by the patient. Immediately prior to procedure a time out was called to verify the correct patient, procedure, equipment, support staff and site/side marked as required. Patient was prepped and draped in the usual sterile fashion.       Clinical Data: No additional findings.   Subjective: Chief Complaint  Patient presents with  . Left Knee - Pain    HPI 63 year old male with bilateral knee osteoarthritis returns with left knee synovitis 4+ effusion pain difficulty walking.  Gradual increase over the last 2 weeks.  Patient  requesting aspiration and cortisone injection which is worked well for him in the past.  Review of Systems 14 point system update unchanged from June 2020 office visit.   Objective: Vital Signs: BP (!) 161/105   Pulse 70   Ht 6' 2"  (1.88 m)   Wt (!) 318 lb (144.2 kg)   BMI 40.83 kg/m   Physical Exam Constitutional:      Appearance: He is well-developed.  HENT:     Head: Normocephalic and atraumatic.  Eyes:     Pupils: Pupils are equal, round, and reactive to light.  Neck:     Thyroid: No thyromegaly.     Trachea: No tracheal deviation.  Cardiovascular:     Rate and Rhythm: Normal rate.  Pulmonary:     Effort: Pulmonary effort is normal.     Breath sounds: No wheezing.  Abdominal:     General: Bowel sounds are normal.     Palpations: Abdomen is soft.  Skin:    General: Skin is warm and dry.     Capillary Refill: Capillary refill takes less than 2 seconds.  Neurological:     Mental Status: He is alert and oriented to person, place, and time.  Psychiatric:        Behavior: Behavior normal.        Thought Content: Thought content normal.        Judgment: Judgment normal.     Ortho Exam patient  is amatory with a cane left knee limp 4+ knee effusion no increased warmth.  Distal pulses are intact negative logroll the hips.  Specialty Comments:  No specialty comments available.  Imaging: No results found.   PMFS History: Patient Active Problem List   Diagnosis Date Noted  . Unilateral primary osteoarthritis, left knee 08/13/2019  . Esophageal varices without bleeding ([place]) 06/22/2019  . Other cirrhosis of liver ([place]) 10/29/2017  . Unilateral primary osteoarthritis, right knee 10/24/2017  . Rectal bleeding 06/06/2017  . 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 DM (diabetes mellitus) ([place]) 11/09/2013  . Unspecified diastolic heart failure   . Morbid obesity ([place])   . IBS (irritable bowel syndrome)   . Ejection fraction   . CAD,  multiple vessel   . Contrast media allergy   . Aneurysm ([place])   . Preop cardiovascular exam   . Prostate cancer ([place]) 03/24/2012  . Diastolic dysfunction 35/57/3220  . GERD (gastroesophageal reflux disease) 12/27/2010  . Hypertension 12/27/2010  . DYSLIPIDEMIA 12/26/2009  . FATTY LIVER DISEASE 12/26/2009   Past Medical History:  Diagnosis Date  . Aneurysm ([place])    right femoral pseudoaneurysm, thrombosed (following 03/20/07 cardiac cath)  . CAD, multiple vessel    PCI circumflex 2008, diffuse disease of the right and LAD treated medically.,  //   Nuclear 2010 no ischemia   //   nuclear May, 2012 no ischemia  . Cirrhosis of liver without mention of alcohol   . Contrast media allergy    pt states he is not allergy to contrast  . Diabetes mellitus without complication ([place])    type 2  . Diverticulosis   . Dysphagia   . Ejection fraction    EF 55%, nuclear, May, 2012  . Esophageal reflux   . IBS (irritable bowel syndrome)   . Lower abdominal pain   . Mixed dyslipidemia   . Morbid obesity ([place])   . Other chronic nonalcoholic liver disease   . Postsurgical percutaneous transluminal coronary angioplasty status   . Preop cardiovascular exam    Cardiovascular clearance for prostate surgery October, 2013  . Prostate cancer ([place]) 03/24/12  . Unspecified diastolic heart failure   . Unspecified essential hypertension     Family History  Problem Relation Age of Onset  . Heart disease Mother   . Breast cancer Mother   . Heart disease Father   . Breast cancer Sister   . Ovarian cancer Sister   . Heart disease Brother   . Healthy Daughter   . Healthy Daughter   . Healthy Daughter   . Healthy Daughter   . Coronary artery disease Unknown        family history    Past Surgical History:  Procedure Laterality Date  . BACK SURGERY     took cyst out of back  . CARDIAC CATHETERIZATION    . CARPAL TUNNEL RELEASE Bilateral   . CHOLECYSTECTOMY  06/1998  . COLONOSCOPY  03/03/03  .  COLONOSCOPY  08/17/2011   Procedure: COLONOSCOPY;  Surgeon: Rogene Houston, MD;  [facility];  Service: Endoscopy;  Laterality: N/A;  1045  . COLONOSCOPY N/A 07/05/2017   Procedure: COLONOSCOPY;  Surgeon: Rogene Houston, MD;  [facility];  Service: Endoscopy;  Laterality: N/A;  2:00  . ESOPHAGOGASTRODUODENOSCOPY (EGD) WITH PROPOFOL N/A 12/20/2017   Procedure: ESOPHAGOGASTRODUODENOSCOPY (EGD) WITH PROPOFOL;  Surgeon: Rogene Houston, MD;  [facility];  Service: Endoscopy;  Laterality: N/A;  11:10 :  . LEFT HEART CATHETERIZATION WITH CORONARY ANGIOGRAM N/A 12/07/2013   Procedure: LEFT HEART CATHETERIZATION WITH CORONARY ANGIOGRAM;  Surgeon: Peter M Martinique, MD;  Location[facility] CATH LAB;  Service: Cardiovascular;  Laterality: N/A;  . left rotator    . POLYPECTOMY  12/20/2017   Procedure: POLYPECTOMY;  Surgeon: Rogene Houston, MD;  [facility];  Service: Endoscopy;;  Gastric (HS) x2  . RIGHT KNEE SURGERY    . ROBOT ASSISTED LAPAROSCOPIC RADICAL PROSTATECTOMY  06/26/2012   Procedure: ROBOTIC ASSISTED LAPAROSCOPIC RADICAL PROSTATECTOMY LEVEL 3;  Surgeon: Dutch Gray, MD;  [facility];  Service: Urology;  Laterality: N/A;      . Two cardiac stents  2009  . UPPER GASTROINTESTINAL ENDOSCOPY  02/21/2011   EGD ED  . UPPER GASTROINTESTINAL ENDOSCOPY  03/03/03   TCS  . VASECTOMY     Social History   Occupational History  . Occupation: [employer]  Tobacco Use  . Smoking status: Former Smoker    Packs/day: 3.00    Years: 30.00    Pack years: 90.00    Types: Cigarettes    Quit date: 08/27/1998    Years since quitting: 20.9  . Smokeless tobacco: Never Used  . Tobacco comment: Pt quit smoking 10 yrs ago, smoked for about 30 yrs.  Substance and Sexual Activity  . Alcohol use: No    Alcohol/week: 0.0 standard drinks  . Drug use: No  . Sexual activity: Not on file

## 2019-08-26 DIAGNOSIS — C61 Malignant neoplasm of prostate: Secondary | ICD-10-CM | POA: Diagnosis not present

## 2019-08-26 DIAGNOSIS — N5201 Erectile dysfunction due to arterial insufficiency: Secondary | ICD-10-CM | POA: Diagnosis not present

## 2019-08-27 ENCOUNTER — Encounter: Payer: Self-pay | Admitting: Orthopaedic Surgery

## 2019-08-27 ENCOUNTER — Ambulatory Visit (INDEPENDENT_AMBULATORY_CARE_PROVIDER_SITE_OTHER): Payer: Medicare PPO | Admitting: Orthopaedic Surgery

## 2019-08-27 VITALS — Ht 74.0 in | Wt 326.4 lb

## 2019-08-27 DIAGNOSIS — Z6841 Body Mass Index (BMI) 40.0 and over, adult: Secondary | ICD-10-CM | POA: Insufficient documentation

## 2019-08-27 DIAGNOSIS — M65862 Other synovitis and tenosynovitis, left lower leg: Secondary | ICD-10-CM | POA: Diagnosis not present

## 2019-08-27 DIAGNOSIS — M659 Synovitis and tenosynovitis, unspecified: Secondary | ICD-10-CM | POA: Insufficient documentation

## 2019-08-27 MED ORDER — LIDOCAINE HCL 1 % IJ SOLN
0.5000 mL | INTRAMUSCULAR | Status: AC | PRN
  Administered 2019-08-27: .5 mL

## 2019-08-27 NOTE — Progress Notes (Signed)
Office Visit Note   Patient: Billy Davis           Date of Birth: 02-13-1956           MRN: [id number] Visit Date: 08/27/2019              Requested by: Arsenio Katz, NP [place],  [place] [id number] PCP: Arsenio Katz, NP   Assessment & Plan: Visit Diagnoses:  1. Body mass index 40.0-44.9, adult ([place])   2. Synovitis of left knee     Plan: Knee aspiration performed as below.  Clear yellow fluid no purulence and no particulate debris.  His pain is medial which is been persistent and he is failed anti-inflammatories intra-articular injections.  We will proceed with an MRI scan to rule out medial meniscal tear office follow-up after scan for review.  Follow-Up Instructions: Return return after MRI.   Orders:  Orders Placed This Encounter  Procedures  . Large Joint Inj: L knee  . MR Knee Left w/o contrast   No orders of the defined types were placed in this encounter.     Procedures: Large Joint Inj: L knee on 08/27/2019 9:16 AM Indications: joint swelling and pain Details: 22 G 1.5 in needle, anterolateral approach  Arthrogram: No  Medications: 0.5 mL lidocaine 1 % Aspirate: 60 mL serous Outcome: tolerated well, no immediate complications Procedure, treatment alternatives, risks and benefits explained, specific risks discussed. Consent was given by the patient. Immediately prior to procedure a time out was called to verify the correct patient, procedure, equipment, support staff and site/side marked as required. Patient was prepped and draped in the usual sterile fashion.       Clinical Data: No additional findings.   Subjective: Chief Complaint  Patient presents with  . Left Knee - Follow-up    HPI 63 year old male returns with recurrent left knee synovitis.  Previous aspiration large amount of clear/yellow serous fluid and intra-articular injection gave him many weeks of relief but now has had recurrent.  He has been trying to lose weight and  unfortunately has gained some weight and is now 326.  His goal weight is 310 to get his BMI below 40 states his last A1c was 7.5.  Previous x-ray showed some mild mild to moderate osteoarthritis.  His pain is medial he has had sharp pain with walking but has not had any true locking.  He is amatory with a cane asked to grab objects to avoid falling at times.  He denies any associated back pain no chills or fever no history of gout.  Review of Systems 14 point update unchanged from 05/07/2019 other than as mentioned in HPI.  Objective: Vital Signs: Ht 6' 2"  (1.88 m)   Wt (!) 326 lb 6.4 oz (148.1 kg)   BMI 41.91 kg/m   Physical Exam Constitutional:      Appearance: He is well-developed.  HENT:     Head: Normocephalic and atraumatic.  Eyes:     Pupils: Pupils are equal, round, and reactive to light.  Neck:     Thyroid: No thyromegaly.     Trachea: No tracheal deviation.  Cardiovascular:     Rate and Rhythm: Normal rate.  Pulmonary:     Effort: Pulmonary effort is normal.     Breath sounds: No wheezing.  Abdominal:     General: Bowel sounds are normal.     Palpations: Abdomen is soft.  Skin:    General: Skin is warm and dry.  Capillary Refill: Capillary refill takes less than 2 seconds.  Neurological:     Mental Status: He is alert and oriented to person, place, and time.  Psychiatric:        Behavior: Behavior normal.        Thought Content: Thought content normal.        Judgment: Judgment normal.     Ortho Exam patient has synovitis left knee without increased warmth.  Medial joint line tenderness and tenderness over the pes bursa.  Collateral ligaments are stable crepitus with knee extension and flexion amatory with a left knee limp opposite right knee did not demonstrated an effusion.  Negative logroll the hips distal pulses are normal no pitting edema.  Specialty Comments:  No specialty comments available.  Imaging: No results found.   PMFS History: Patient  Active Problem List   Diagnosis Date Noted  . Synovitis of left knee 08/27/2019  . Body mass index 40.0-44.9, adult ([place]) 08/27/2019  . Unilateral primary osteoarthritis, left knee 08/13/2019  . Esophageal varices without bleeding ([facility]) 06/22/2019  . Other cirrhosis of liver ([place]) 10/29/2017  . Unilateral primary osteoarthritis, right knee 10/24/2017  . Rectal bleeding 06/06/2017  . 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 DM (diabetes mellitus) ([place]) 11/09/2013  . Unspecified diastolic heart failure   . Morbid obesity ([place])   . IBS (irritable bowel syndrome)   . Ejection fraction   . CAD, multiple vessel   . Contrast media allergy   . Aneurysm ([place])   . Preop cardiovascular exam   . Prostate cancer ([place]) 03/24/2012  . Diastolic dysfunction 95/04/3266  . GERD (gastroesophageal reflux disease) 12/27/2010  . Hypertension 12/27/2010  . DYSLIPIDEMIA 12/26/2009  . FATTY LIVER DISEASE 12/26/2009   Past Medical History:  Diagnosis Date  . Aneurysm ([place])    right femoral pseudoaneurysm, thrombosed (following 03/20/07 cardiac cath)  . CAD, multiple vessel    PCI circumflex 2008, diffuse disease of the right and LAD treated medically.,  //   Nuclear 2010 no ischemia   //   nuclear May, 2012 no ischemia  . Cirrhosis of liver without mention of alcohol   . Contrast media allergy    pt states he is not allergy to contrast  . Diabetes mellitus without complication ([place])    type 2  . Diverticulosis   . Dysphagia   . Ejection fraction    EF 55%, nuclear, May, 2012  . Esophageal reflux   . IBS (irritable bowel syndrome)   . Lower abdominal pain   . Mixed dyslipidemia   . Morbid obesity ([place])   . Other chronic nonalcoholic liver disease   . Postsurgical percutaneous transluminal coronary angioplasty status   . Preop cardiovascular exam    Cardiovascular clearance for prostate surgery October, 2013  . Prostate cancer ([place]) 03/24/12  . Unspecified diastolic  heart failure   . Unspecified essential hypertension     Family History  Problem Relation Age of Onset  . Heart disease Mother   . Breast cancer Mother   . Heart disease Father   . Breast cancer Sister   . Ovarian cancer Sister   . Heart disease Brother   . Healthy Daughter   . Healthy Daughter   . Healthy Daughter   . Healthy Daughter   . Coronary artery disease Unknown        family history    Past Surgical History:  Procedure Laterality Date  . BACK SURGERY     took  cyst out of back  . CARDIAC CATHETERIZATION    . CARPAL TUNNEL RELEASE Bilateral   . CHOLECYSTECTOMY  06/1998  . COLONOSCOPY  03/03/03  . COLONOSCOPY  08/17/2011   Procedure: COLONOSCOPY;  Surgeon: Rogene Houston, MD;  [facility];  Service: Endoscopy;  Laterality: N/A;  1045  . COLONOSCOPY N/A 07/05/2017   Procedure: COLONOSCOPY;  Surgeon: Rogene Houston, MD;  [facility];  Service: Endoscopy;  Laterality: N/A;  2:00  . ESOPHAGOGASTRODUODENOSCOPY (EGD) WITH PROPOFOL N/A 12/20/2017   Procedure: ESOPHAGOGASTRODUODENOSCOPY (EGD) WITH PROPOFOL;  Surgeon: Rogene Houston, MD;  [facility];  Service: Endoscopy;  Laterality: N/A;  11:10 :  . LEFT HEART CATHETERIZATION WITH CORONARY ANGIOGRAM N/A 12/07/2013   Procedure: LEFT HEART CATHETERIZATION WITH CORONARY ANGIOGRAM;  Surgeon: Peter M Martinique, MD;  Location: [facility] CATH LAB;  Service: Cardiovascular;  Laterality: N/A;  . left rotator    . POLYPECTOMY  12/20/2017   Procedure: POLYPECTOMY;  Surgeon: Rogene Houston, MD;  [facility];  Service: Endoscopy;;  Gastric (HS) x2  . RIGHT KNEE SURGERY    . ROBOT ASSISTED LAPAROSCOPIC RADICAL PROSTATECTOMY  06/26/2012   Procedure: ROBOTIC ASSISTED LAPAROSCOPIC RADICAL PROSTATECTOMY LEVEL 3;  Surgeon: Dutch Gray, MD;  [facility];  Service: Urology;  Laterality: N/A;      . Two cardiac stents  2009  . UPPER GASTROINTESTINAL ENDOSCOPY  02/21/2011   EGD ED  . UPPER  GASTROINTESTINAL ENDOSCOPY  03/03/03   TCS  . VASECTOMY     Social History   Occupational History  . Occupation: [employer]  Tobacco Use  . Smoking status: Former Smoker    Packs/day: 3.00    Years: 30.00    Pack years: 90.00    Types: Cigarettes    Quit date: 08/27/1998    Years since quitting: 21.0  . Smokeless tobacco: Never Used  . Tobacco comment: Pt quit smoking 10 yrs ago, smoked for about 30 yrs.  Substance and Sexual Activity  . Alcohol use: No    Alcohol/week: 0.0 standard drinks  . Drug use: No  . Sexual activity: Not on file

## 2019-09-11 ENCOUNTER — Ambulatory Visit: Payer: Medicare PPO | Admitting: Internal Medicine

## 2019-09-22 ENCOUNTER — Encounter (INDEPENDENT_AMBULATORY_CARE_PROVIDER_SITE_OTHER): Payer: Self-pay | Admitting: *Deleted

## 2019-10-02 ENCOUNTER — Telehealth: Payer: Self-pay

## 2019-10-08 ENCOUNTER — Other Ambulatory Visit: Payer: Self-pay

## 2019-10-08 ENCOUNTER — Ambulatory Visit (INDEPENDENT_AMBULATORY_CARE_PROVIDER_SITE_OTHER): Payer: Medicare Other | Admitting: Orthopaedic Surgery

## 2019-10-08 ENCOUNTER — Encounter: Payer: Self-pay | Admitting: Orthopaedic Surgery

## 2019-10-08 NOTE — Progress Notes (Signed)
Office Visit Note   Patient: Billy Davis           Date of Birth: May 29, 1956           MRN: [id number] Visit Date: 10/08/2019              Requested by: Arsenio Katz, NP [place],  [place] [id number] PCP: Arsenio Katz, NP   Assessment & Plan: Visit Diagnoses:  1. Erroneous encounter - disregard     Plan: MRI not on disc, no report , return one week  Follow-Up Instructions: No follow-ups on file.   Orders:  No orders of the defined types were placed in this encounter.  No orders of the defined types were placed in this encounter.     Procedures: No procedures performed   Clinical Data: No additional findings.   Subjective: Chief Complaint  Patient presents with  . Left Knee - Pain    MRI Review    HPIsee ablve  Review of SystemsN/a   Objective: Vital Signs: BP (!) 160/111   Pulse 69   Ht 6' 2"  (1.88 m)   Wt (!) 320 lb (145.2 kg)   BMI 41.09 kg/m   Physical Examnone  Ortho Examnon  Specialty Comments:  No specialty comments available.  Imaging: No results found.   PMFS History: Patient Active Problem List   Diagnosis Date Noted  . Erroneous encounter - disregard 10/09/2019  . Synovitis of left knee 08/27/2019  . Body mass index 40.0-44.9, adult ([place]) 08/27/2019  . Unilateral primary osteoarthritis, left knee 08/13/2019  . Esophageal varices without bleeding ([place]) 06/22/2019  . Other cirrhosis of liver ([place]) 10/29/2017  . Unilateral primary osteoarthritis, right knee 10/24/2017  . Rectal bleeding 06/06/2017  . 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 DM (diabetes mellitus) ([place]) 11/09/2013  . Unspecified diastolic heart failure   . Morbid obesity ([place])   . IBS (irritable bowel syndrome)   . Ejection fraction   . CAD, multiple vessel   . Contrast media allergy   . Aneurysm ([place])   . Preop cardiovascular exam   . Prostate cancer ([place]) 03/24/2012  . Diastolic dysfunction 54/65/0354  . GERD  (gastroesophageal reflux disease) 12/27/2010  . Hypertension 12/27/2010  . DYSLIPIDEMIA 12/26/2009  . FATTY LIVER DISEASE 12/26/2009   Past Medical History:  Diagnosis Date  . 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 CAD, multiple vessel    PCI circumflex 2008, diffuse disease of the right and LAD treated medically.,  //   Nuclear 2010 no ischemia   //   nuclear May, 2012 no ischemia  . Cirrhosis of liver without mention of alcohol   . Contrast media allergy    pt states he is not allergy to contrast  . Diabetes mellitus without complication ([place])    type 2  . Diverticulosis   . Dysphagia   . Ejection fraction    EF 55%, nuclear, May, 2012  . Esophageal reflux   . IBS (irritable bowel syndrome)   . Lower abdominal pain   . Mixed dyslipidemia   . Morbid obesity ([place])   . Other chronic nonalcoholic liver disease   . Postsurgical percutaneous transluminal coronary angioplasty status   . Preop cardiovascular exam    Cardiovascular clearance for prostate surgery October, 2013  . Prostate cancer ([place]) 03/24/12  . Unspecified diastolic heart failure   . Unspecified essential hypertension     Family History  Problem Relation Age of Onset  . Heart disease Mother   . Breast cancer Mother   . Heart disease Father   . Breast cancer Sister   . Ovarian cancer Sister   . Heart disease Brother   . Healthy Daughter   . Healthy Daughter   . Healthy Daughter   . Healthy Daughter   . Coronary artery disease Unknown        family history    Past Surgical History:  Procedure Laterality Date  . BACK SURGERY     took cyst out of back  . CARDIAC CATHETERIZATION    . CARPAL TUNNEL RELEASE Bilateral   . CHOLECYSTECTOMY  06/1998  . COLONOSCOPY  03/03/03  . COLONOSCOPY  08/17/2011   Procedure: COLONOSCOPY;  Surgeon: Rogene Houston, MD;  [facility];  Service: Endoscopy;  Laterality: N/A;  1045  . COLONOSCOPY N/A 07/05/2017    Procedure: COLONOSCOPY;  Surgeon: Rogene Houston, MD;  [facility];  Service: Endoscopy;  Laterality: N/A;  2:00  . ESOPHAGOGASTRODUODENOSCOPY (EGD) WITH PROPOFOL N/A 12/20/2017   Procedure: ESOPHAGOGASTRODUODENOSCOPY (EGD) WITH PROPOFOL;  Surgeon: Rogene Houston, MD;  [facility];  Service: Endoscopy;  Laterality: N/A;  11:10 :  . LEFT HEART CATHETERIZATION WITH CORONARY ANGIOGRAM N/A 12/07/2013   Procedure: LEFT HEART CATHETERIZATION WITH CORONARY ANGIOGRAM;  Surgeon: Peter M Martinique, MD;  Location: [facility] CATH LAB;  Service: Cardiovascular;  Laterality: N/A;  . left rotator    . POLYPECTOMY  12/20/2017   Procedure: POLYPECTOMY;  Surgeon: Rogene Houston, MD;  [facility];  Service: Endoscopy;;  Gastric ([facility]) x2  . RIGHT KNEE SURGERY    . ROBOT ASSISTED LAPAROSCOPIC RADICAL PROSTATECTOMY  06/26/2012   Procedure: ROBOTIC ASSISTED LAPAROSCOPIC RADICAL PROSTATECTOMY LEVEL 3;  Surgeon: Dutch Gray, MD;  [facility];  Service: Urology;  Laterality: N/A;      . Two cardiac stents  2009  . UPPER GASTROINTESTINAL ENDOSCOPY  02/21/2011   EGD ED  . UPPER GASTROINTESTINAL ENDOSCOPY  03/03/03   TCS  . VASECTOMY     Social History   Occupational History  . Occupation: [employer]  Tobacco Use  . Smoking status: Former Smoker    Packs/day: 3.00    Years: 30.00    Pack years: 90.00    Types: Cigarettes    Quit date: 08/27/1998    Years since quitting: 21.1  . Smokeless tobacco: Never Used  . Tobacco comment: Pt quit smoking 10 yrs ago, smoked for about 30 yrs.  Substance and Sexual Activity  . Alcohol use: No    Alcohol/week: 0.0 standard drinks  . Drug use: No  . Sexual activity: Not on file

## 2019-10-15 ENCOUNTER — Telehealth: Payer: Self-pay | Admitting: Radiology

## 2019-10-15 NOTE — Telephone Encounter (Signed)
I called and discussed.  He will make appointment to come in the following week.  Patient has complex meniscal tear partial extrusion of the medial meniscus but also has significant arthritis in his knee.  Knee arthritis versus total knee arthroplasty discussed in detail.  We can discuss it further when he comes back next week.

## 2019-10-15 NOTE — Telephone Encounter (Signed)
Patient is unable to make appointment today. He requests call with MRI results.  CB [phone]

## 2019-10-22 ENCOUNTER — Ambulatory Visit (INDEPENDENT_AMBULATORY_CARE_PROVIDER_SITE_OTHER): Payer: Medicare Other | Admitting: Orthopaedic Surgery

## 2019-10-22 ENCOUNTER — Encounter: Payer: Self-pay | Admitting: Orthopaedic Surgery

## 2019-10-22 VITALS — BP 170/99 | HR 67 | Ht 74.0 in | Wt 327.0 lb

## 2019-10-22 DIAGNOSIS — Z6841 Body Mass Index (BMI) 40.0 and over, adult: Secondary | ICD-10-CM | POA: Diagnosis not present

## 2019-10-22 DIAGNOSIS — M1712 Unilateral primary osteoarthritis, left knee: Secondary | ICD-10-CM

## 2019-10-22 NOTE — Progress Notes (Signed)
Office Visit Note   Patient: Billy Davis           Date of Birth: [birth year]           MRN: [id number] Visit Date: 10/22/2019              Requested by: Arsenio Katz, NP [place],  [place] [id number]  + PCP: Arsenio Katz, NP   Assessment & Plan: Visit Diagnoses:  1. Unilateral primary osteoarthritis, left knee   2. Body mass index 40.0-44.9, adult [facility])     Plan: Patient needs to get his lower extremity pitting edema evaluated.  He is already on diuretic for his blood pressure but also takes Norvasc and the lower extremity edema might be related to Norvasc.  Previous renal function looked okay but I do not have any recent labs.  Once he gets checked out by his PCP and liver doctor he needs to work on improving his A1c to get it below 7.5 to get his weight down to 311 or less so is a candidate for knee arthroplasty.  We discussed possible knee arthroscopy but he understands that improvement would be not predictable since he has significant arthritis in his knee in addition to the complex medial meniscal tear.  I plan to recheck him in 3 months.  Follow-Up Instructions: Return in about 3 months (around 01/19/2020).   Orders:  No orders of the defined types were placed in this encounter.  No orders of the defined types were placed in this encounter.     Procedures: No procedures performed   Clinical Data: No additional findings.   Subjective: Chief Complaint  Patient presents with  . Left Knee - Follow-up    MRI Left Knee Review    HPI 64 year old male returns with continued problems with bilateral knee pain left greater than right.  At times she states he can barely walk up he is been active during the day.  He denies any true catching he has swelling of his knees but also has significant lower extremity edema.  Patient states he has had a past history of fatty liver.  Review of past labs showed that he has had 1 elevation of SGOT and other liver tests were normal.   He does have diabetes with A1c greater than 8.  MRI scan has been done at [facility] for his left knee and is available for review.  MRI results reviewed with patient also his wife is with him today.  MRI shows complex medial meniscal tear but also areas of grade IV chondromalacia tricompartmental with worse changes on MRI as far as arthritis then noted on plain radiographs.  BMI is elevated with his weight 327 and his goal is 311 to get his BMI at 40.  Review of Systems 14 point system update unchanged from previous office note other than as mentioned in HPI.   Objective: Vital Signs: BP (!) 170/99   Pulse 67   Ht 6' 2"  (1.88 m)   Wt (!) 327 lb (148.3 kg)   BMI 41.98 kg/m   Physical Exam Constitutional:      Appearance: He is well-developed.  HENT:     Head: Normocephalic and atraumatic.  Eyes:     Pupils: Pupils are equal, round, and reactive to light.  Neck:     Thyroid: No thyromegaly.     Trachea: No tracheal deviation.  Cardiovascular:     Rate and Rhythm: Normal rate.  Pulmonary:  Effort: Pulmonary effort is normal.     Breath sounds: No wheezing.  Abdominal:     General: Bowel sounds are normal.     Palpations: Abdomen is soft.  Skin:    General: Skin is warm and dry.     Capillary Refill: Capillary refill takes less than 2 seconds.  Neurological:     Mental Status: He is alert and oriented to person, place, and time.  Psychiatric:        Behavior: Behavior normal.        Thought Content: Thought content normal.        Judgment: Judgment normal.     Ortho Exam patient has bilateral pitting edema lower extremities.  Crepitus with knee range of motion medial lateral joint line tenderness.  Knee reaches full extension flexes 110 degrees.  Specialty Comments:  No specialty comments available.  Imaging:     MRI left knee 10/08/2019 IMPRESSION: 1. Complex tear and extrusion of the medial meniscus. 2. Edema in the medial tibial plateau may be degenerative in  etiology. However, a developing subchondral deficiency fracture or stress reaction is not excluded. 3. Reactive changes or grade 1 sprain of the medial collateral ligament. 4. 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 5. Moderate knee joint effusion with posterior loose body. 6. Mild tendinosis of the extensor mechanism.  Electronically Signed by: Salvadore Oxford   PMFS History: Patient Active Problem List   Diagnosis Date Noted  . Erroneous encounter - disregard 10/09/2019  . Synovitis of left knee 08/27/2019  . Body mass index 40.0-44.9, adult ([place]) 08/27/2019  . Unilateral primary osteoarthritis, left knee 08/13/2019  . Esophageal varices without bleeding ([place]) 06/22/2019  . Other cirrhosis of liver ([place]) 10/29/2017  . Unilateral primary osteoarthritis, right knee 10/24/2017  . Rectal bleeding 06/06/2017  . 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 DM (diabetes mellitus) ([place]) 11/09/2013  . Unspecified diastolic heart failure   . Morbid obesity ([place])   . IBS (irritable bowel syndrome)   . Ejection fraction   . CAD, multiple vessel   . Contrast media allergy   . Aneurysm ([place])   . Preop cardiovascular exam   . Prostate cancer ([place]) 03/24/2012  . Diastolic dysfunction 95/04/3266  . GERD (gastroesophageal reflux disease) 12/27/2010  . Hypertension 12/27/2010  . DYSLIPIDEMIA 12/26/2009  . FATTY LIVER DISEASE 12/26/2009   Past Medical History:  Diagnosis Date  . Aneurysm ([place])    right femoral pseudoaneurysm, thrombosed (following 03/20/07 cardiac cath)  . CAD, multiple vessel    PCI circumflex 2008, diffuse disease of the right and LAD treated medically.,  //   Nuclear 2010 no ischemia   //   nuclear May, 2012 no ischemia  . Cirrhosis of liver without mention of alcohol   . Contrast media allergy    pt states he is not allergy to contrast  . Diabetes mellitus without complication ([place])     type 2  . Diverticulosis   . Dysphagia   . Ejection fraction    EF 55%, nuclear, May, 2012  . Esophageal reflux   . IBS (irritable bowel syndrome)   . Lower abdominal pain   . Mixed dyslipidemia   . Morbid obesity ([place])   . Other chronic nonalcoholic liver disease   . Postsurgical percutaneous transluminal coronary angioplasty status   . Preop cardiovascular exam    Cardiovascular clearance for prostate surgery October, 2013  . Prostate cancer ([place]) 03/24/12  .  Unspecified diastolic heart failure   . Unspecified essential hypertension     Family History  Problem Relation Age of Onset  . Heart disease Mother   . Breast cancer Mother   . Heart disease Father   . Breast cancer Sister   . Ovarian cancer Sister   . Heart disease Brother   . Healthy Daughter   . Healthy Daughter   . Healthy Daughter   . Healthy Daughter   . Coronary artery disease Unknown        family history    Past Surgical History:  Procedure Laterality Date  . BACK SURGERY     took cyst out of back  . CARDIAC CATHETERIZATION    . CARPAL TUNNEL RELEASE Bilateral   . CHOLECYSTECTOMY  06/1998  . COLONOSCOPY  03/03/03  . COLONOSCOPY  08/17/2011   Procedure: COLONOSCOPY;  Surgeon: Rogene Houston, MD;  [facility];  Service: Endoscopy;  Laterality: N/A;  1045  . COLONOSCOPY N/A 07/05/2017   Procedure: COLONOSCOPY;  Surgeon: Rogene Houston, MD;  [facility];  Service: Endoscopy;  Laterality: N/A;  2:00  . ESOPHAGOGASTRODUODENOSCOPY (EGD) WITH PROPOFOL N/A 12/20/2017   Procedure: ESOPHAGOGASTRODUODENOSCOPY (EGD) WITH PROPOFOL;  Surgeon: Rogene Houston, MD;  [facility];  Service: Endoscopy;  Laterality: N/A;  11:10 :  . LEFT HEART CATHETERIZATION WITH CORONARY ANGIOGRAM N/A 12/07/2013   Procedure: LEFT HEART CATHETERIZATION WITH CORONARY ANGIOGRAM;  Surgeon: Peter M Martinique, MD;  Location: [facility] CATH LAB;  Service: Cardiovascular;  Laterality: N/A;  . left rotator    .  POLYPECTOMY  12/20/2017   Procedure: POLYPECTOMY;  Surgeon: Rogene Houston, MD;  [facility];  Service: Endoscopy;;  Gastric (HS) x2  . RIGHT KNEE SURGERY    . ROBOT ASSISTED LAPAROSCOPIC RADICAL PROSTATECTOMY  06/26/2012   Procedure: ROBOTIC ASSISTED LAPAROSCOPIC RADICAL PROSTATECTOMY LEVEL 3;  Surgeon: Dutch Gray, MD;  [facility];  Service: Urology;  Laterality: N/A;      . Two cardiac stents  2009  . UPPER GASTROINTESTINAL ENDOSCOPY  02/21/2011   EGD ED  . UPPER GASTROINTESTINAL ENDOSCOPY  03/03/03   TCS  . VASECTOMY     Social History   Occupational History  . Occupation: [employer]  Tobacco Use  . Smoking status: Former Smoker    Packs/day: 3.00    Years: 30.00    Pack years: 90.00    Types: Cigarettes    Quit date: 08/27/1998    Years since quitting: 21.1  . Smokeless tobacco: Never Used  . Tobacco comment: Pt quit smoking 10 yrs ago, smoked for about 30 yrs.  Substance and Sexual Activity  . Alcohol use: No    Alcohol/week: 0.0 standard drinks  . Drug use: No  . Sexual activity: Not on file

## 2019-10-26 ENCOUNTER — Ambulatory Visit (INDEPENDENT_AMBULATORY_CARE_PROVIDER_SITE_OTHER): Payer: Medicare Other | Admitting: Internal Medicine

## 2019-10-26 ENCOUNTER — Other Ambulatory Visit: Payer: Self-pay

## 2019-10-26 ENCOUNTER — Encounter (INDEPENDENT_AMBULATORY_CARE_PROVIDER_SITE_OTHER): Payer: Self-pay | Admitting: Internal Medicine

## 2019-10-26 VITALS — BP 166/77 | HR 69 | Temp 97.2°F | Ht 74.0 in | Wt 325.6 lb

## 2019-10-26 DIAGNOSIS — R600 Localized edema: Secondary | ICD-10-CM

## 2019-10-26 DIAGNOSIS — K7460 Unspecified cirrhosis of liver: Secondary | ICD-10-CM | POA: Diagnosis not present

## 2019-10-26 DIAGNOSIS — K219 Gastro-esophageal reflux disease without esophagitis: Secondary | ICD-10-CM

## 2019-10-26 MED ORDER — FAMOTIDINE 20 MG PO TABS: 20.0000 mg | ORAL_TABLET | Freq: Every day | ORAL | Status: DC

## 2019-10-26 NOTE — Patient Instructions (Signed)
Take pantoprazole 30 minutes before breakfast daily and skip evening dose. Instead take Pepcid OTC/famotidine 20 mg daily at bedtime. If above combination does not work. Physician will call the results of blood tests and ultrasound when completed.

## 2019-10-26 NOTE — Progress Notes (Signed)
Presenting complaint;  Follow-up for cirrhosis and GERD. Patient having problem with fluid retention.  Database and subjective:  Patient is 64 year old Caucasian male who has cirrhosis due to NASH who is here for scheduled visit accompanied by his wife.  He was last seen in August 2020.  He has gained 8 pounds since his last visit.  He has been having progressive edema to lower extremities.  He wonders if lower extremity edema is due to chronic liver disease.  His last ultrasound of April 01, 2019 did not reveal any evidence of ascites. He feels heartburn is well controlled with therapy.  He denies dysphagia nausea or vomiting.  At times he has difficulty clearing his throat.  He takes Motrin no more than 3-4 doses per month.  He is on low-dose aspirin.  He also has been taking full dose aspirin for the last few weeks.  It is not listed in his medications.  He denies chest pain or shortness of breath.  He says he has not taken nitroglycerin on over 2 years.  He is having difficulty walking because of knee arthritis.  He is hoping to get knee replacement sometime this year.  His last EGD was in April 2019 revealing grade 1 esophageal varices portal hypertensive gastropathy and he had two gastric polyps removed which were hyperplastic with granulation tissue.   Current Medications: Outpatient Encounter Medications as of 10/26/2019  Medication Sig  . amLODipine (NORVASC) 10 MG tablet Take 10 mg by mouth daily.   Marland Kitchen aspirin EC 81 MG tablet Take 1 tablet (81 mg total) by mouth daily.  Marland Kitchen ibuprofen (ADVIL,MOTRIN) 200 MG tablet Take 400 mg by mouth every 8 (eight) hours as needed for mild pain or moderate pain.  . Insulin NPH Human, Isophane, (NOVOLIN N [place]) Inject into the skin. Patient states that he is taking Novolin 40 units twice a day.  . lisinopril (PRINIVIL,ZESTRIL) 40 MG tablet Take 40 mg by mouth every morning.   . metoprolol (LOPRESSOR) 50 MG tablet Take 50 mg by mouth daily.  . Multiple Vitamin  (MULTI VITAMIN MENS PO) Take 1 tablet by mouth daily.   Marland Kitchen NITROSTAT 0.4 MG SL tablet Place 1 tablet under the tongue every 5 (five) minutes as needed for chest pain. For chest pains  . pantoprazole (PROTONIX) 40 MG tablet Take 40 mg by mouth 2 (two) times daily.   Marland Kitchen spironolactone (ALDACTONE) 25 MG tablet Take 25 mg by mouth 2 (two) times daily.   Marland Kitchen atorvastatin (LIPITOR) 10 MG tablet Take 10 mg by mouth daily.  . chlorthalidone (HYGROTON) 25 MG tablet TAKE 1 TABLET DAILY (Patient not taking: Reported on 10/02/2018)  . dicyclomine (BENTYL) 10 MG capsule TAKE 1 CAPSULE DAILY (Patient not taking: Reported on 10/02/2018)  . insulin glargine (LANTUS) 100 unit/mL SOPN Inject 85 Units into the skin at bedtime.   . potassium chloride SA (K-DUR,KLOR-CON) 20 MEQ tablet Take 1 tablet (20 mEq total) by mouth daily. (Patient not taking: Reported on 10/02/2018)   No facility-administered encounter medications on file as of 10/26/2019.     Objective: Blood pressure (!) 166/77, pulse 69, temperature (!) 97.2 F (36.2 C), temperature source Temporal, height 6' 2"  (1.88 m), weight (!) 325 lb 9.6 oz (147.7 kg). Patient is alert and in no acute distress. He is wearing a facial mask. Conjunctiva is pink. Sclera is nonicteric Oropharyngeal mucosa is normal. No neck masses or thyromegaly noted. Cardiac exam with regular rhythm normal S1 and S2. No murmur or gallop  noted. Lungs are clear to auscultation. Abdomen is obese but soft and nontender with organomegaly or masses.  Shifting dullness is absent.  He does not have edema to abdominal wall. He has 2+ pitting edema to both legs.  Labs/studies Results:  CBC Latest Ref Rng & Units 03/31/2019 12/16/2017 10/29/2017  WBC 3.8 - 10.8 Thousand/uL 5.8 8.9 7.8  Hemoglobin 13.2 - 17.1 g/dL 14.6 15.1 15.3  Hematocrit 38.5 - 50.0 % 43.5 45.3 45.1  Platelets 140 - 400 Thousand/uL 151 143(L) 156    CMP Latest Ref Rng & Units 03/31/2019 12/16/2017 10/29/2017  Glucose 65 - 139 mg/dL  181(H) 183(H) 156(H)  BUN 7 - 25 mg/dL 9 18 14   Creatinine 0.70 - 1.25 mg/dL 0.76 0.80 0.86  Sodium 135 - 146 mmol/L 139 136 140  Potassium 3.5 - 5.3 mmol/L 3.8 3.4(L) 3.8  Chloride 98 - 110 mmol/L 104 101 101  CO2 20 - 32 mmol/L 25 26 31   Calcium 8.6 - 10.3 mg/dL 9.3 9.6 9.7  Total Protein 6.1 - 8.1 g/dL 7.0 - -  Total Bilirubin 0.2 - 1.2 mg/dL 1.0 - -  Alkaline Phos 38 - 126 U/L - - -  AST 10 - 35 U/L 32 - -  ALT 9 - 46 U/L 37 - -    Hepatic Function Latest Ref Rng & Units 03/31/2019 06/05/2017 07/05/2016  Total Protein 6.1 - 8.1 g/dL 7.0 7.2 7.3  Albumin 3.5 - 5.0 g/dL - - 4.3  AST 10 - 35 U/L 32 26 36  ALT 9 - 46 U/L 37 32 39  Alk Phosphatase 38 - 126 U/L - - 54  Total Bilirubin 0.2 - 1.2 mg/dL 1.0 0.5 1.3(H)  Bilirubin, Direct 0.0 - 0.2 mg/dL - 0.1 0.2    Last ultrasound was on 04/01/2019 revealing cirrhotic liver without focal abnormalities splenomegaly but no evidence of ascites.  Assessment:  #1.  Cirrhosis secondary to NASH.  He remains with well compensated hepatic function.  He has never been able to control his weight.  I wonder for how long hepatic function will continue to be well compensated.  He is due for screening for [place].  #2.  Chronic GERD.  He is doing well with double dose PPI.  I do not believe his difficulty clearing throat is necessarily due to GERD.  It may be related to postnasal discharge.  #3.  Lower extremity edema.  I doubt that lower extremity edema is due to chronic liver disease.  I wonder if he has venous incompetence or cardiopulmonary disease.  He will follow up with Dr. Manuella Ghazi.   Plan:  Patient will go to the lab for CBC, comprehensive chemistry panel INR and AFP. Abdominal ultrasound primarily for [place] screening. Decrease pantoprazole to 40 mg daily before breakfast.  Will not change prescription until it is established that he is doing well with single dose PPI. He can take Pepcid/famotidine OTC 20 mg nightly as needed on days when he does not  take pantoprazole. Office visit in 6 months. Will consider esophagogastroduodenoscopy following his next visit to screen for esophageal varices.

## 2019-10-28 ENCOUNTER — Telehealth (INDEPENDENT_AMBULATORY_CARE_PROVIDER_SITE_OTHER): Payer: Self-pay | Admitting: *Deleted

## 2019-10-28 NOTE — Telephone Encounter (Signed)
[facility] was called and the Hemoglobin A1C has been added. Per Lockie Pares ([facility] Representative) we should have the result by Friday , March 5 th.

## 2019-10-29 LAB — COMPREHENSIVE METABOLIC PANEL
AG Ratio: 1.7 (calc) (ref 1.0–2.5)
ALT: 37 U/L (ref 9–46)
AST: 33 U/L (ref 10–35)
Albumin: 4.5 g/dL (ref 3.6–5.1)
Alkaline phosphatase (APISO): 89 U/L (ref 35–144)
BUN: 18 mg/dL (ref 7–25)
CO2: 27 mmol/L (ref 20–32)
Calcium: 9.5 mg/dL (ref 8.6–10.3)
Chloride: 104 mmol/L (ref 98–110)
Creat: 0.89 mg/dL (ref 0.70–1.25)
Globulin: 2.6 g/dL (calc) (ref 1.9–3.7)
Glucose, Bld: 176 mg/dL — ABNORMAL HIGH (ref 65–139)
Potassium: 4.1 mmol/L (ref 3.5–5.3)
Sodium: 140 mmol/L (ref 135–146)
Total Bilirubin: 0.9 mg/dL (ref 0.2–1.2)
Total Protein: 7.1 g/dL (ref 6.1–8.1)

## 2019-10-29 LAB — CBC
HCT: 43.2 % (ref 38.5–50.0)
Hemoglobin: 14.6 g/dL (ref 13.2–17.1)
MCH: 27.4 pg (ref 27.0–33.0)
MCHC: 33.8 g/dL (ref 32.0–36.0)
MCV: 81.2 fL (ref 80.0–100.0)
MPV: 12 fL (ref 7.5–12.5)
Platelets: 165 10*3/uL (ref 140–400)
RBC: 5.32 10*6/uL (ref 4.20–5.80)
RDW: 14.1 % (ref 11.0–15.0)
WBC: 6.5 10*3/uL (ref 3.8–10.8)

## 2019-10-29 LAB — TEST AUTHORIZATION

## 2019-10-29 LAB — AFP TUMOR MARKER: AFP-Tumor Marker: 2.5 ng/mL (ref <6.1)

## 2019-10-29 LAB — PROTIME-INR
INR: 1.1
Prothrombin Time: 11.6 s — ABNORMAL HIGH (ref 9.0–11.5)

## 2019-10-29 LAB — HEMOGLOBIN A1C W/OUT EAG: Hgb A1c MFr Bld: 9.4 % of total Hgb — ABNORMAL HIGH (ref <5.7)

## 2019-11-02 ENCOUNTER — Ambulatory Visit (HOSPITAL_COMMUNITY)
Admission: RE | Admit: 2019-11-02 | Discharge: 2019-11-02 | Disposition: A | Discharge status: Home / Self Care | Payer: Medicare Other | Source: Ambulatory Visit | Attending: Internal Medicine | Admitting: Internal Medicine

## 2019-11-02 ENCOUNTER — Other Ambulatory Visit: Payer: Self-pay

## 2019-11-02 DIAGNOSIS — K7460 Unspecified cirrhosis of liver: Secondary | ICD-10-CM | POA: Insufficient documentation

## 2019-11-23 ENCOUNTER — Encounter: Payer: Self-pay | Admitting: *Deleted

## 2019-11-24 ENCOUNTER — Encounter: Payer: Self-pay | Admitting: Cardiology

## 2019-11-24 ENCOUNTER — Ambulatory Visit: Payer: Medicare Other | Admitting: Cardiology

## 2019-11-24 ENCOUNTER — Encounter: Payer: Self-pay | Admitting: *Deleted

## 2019-11-24 ENCOUNTER — Other Ambulatory Visit: Payer: Self-pay

## 2019-11-24 VITALS — BP 138/76 | HR 74 | Ht 74.0 in | Wt 312.0 lb

## 2019-11-24 DIAGNOSIS — I2510 Atherosclerotic heart disease of native coronary artery without angina pectoris: Secondary | ICD-10-CM | POA: Diagnosis not present

## 2019-11-24 DIAGNOSIS — E782 Mixed hyperlipidemia: Secondary | ICD-10-CM | POA: Diagnosis not present

## 2019-11-24 DIAGNOSIS — I10 Essential (primary) hypertension: Secondary | ICD-10-CM | POA: Diagnosis not present

## 2019-11-24 MED ORDER — METOPROLOL TARTRATE 50 MG PO TABS: 50.0000 mg | ORAL_TABLET | Freq: Two times a day (BID) | ORAL | 1 refills | Status: DC

## 2019-11-24 NOTE — Patient Instructions (Addendum)
Your physician wants you to follow-up in: [place] will receive a reminder letter in the mail two months in advance. If you don't receive a letter, please call our office to schedule the follow-up appointment.  Your physician has recommended you make the following change in your medication:   INCREASE LOPRESSOR 50 MG TWICE DAILY   Thank you for choosing [facility]!!

## 2019-11-24 NOTE — Progress Notes (Addendum)
Clinical Summary [name] is a 64 y.o.male seen today as a new patient for the following medical problems.   1. CAD  - prior BMS to LCX in 2008, with noted moderate diffuse disease of LAD and RCA that has been treated medically. LVEF by LV gram 60% at that time  - Lexiscan MPI 02/2013 with multiple defects and normal wall motion thought most likely artifact. LVEF 50%.  - cath 12/07/2013 LM patent, LAD 30% prox, LCX patent, OM1 with patent stent, RCA 30% prox disease, LVEF 55-65% by LV gram   From [facility] notes presented to [facility] on 04/25/19 with chest pain occurring at rest over the prior week.  - 04/2019 nuclear Stress [facility]: moderate reversibility inferoapical and inferior wall.  - antianginals were titrated and symptoms resolved. He did not want to pursue cath.  03/2019 echo [facility]: LVEF 60-65%  - no recent chest pain. No SOB or DOE - walks 300 yards regularly without troubles. - compliant with meds, however taking lopressor just once daily.   2. Hyperlipidemia  - 11/ 2020 TC 190 TG 90 HDL 52 LDL 122 - started 05/2019 atorva 67m x 124monthtopped - reluctant to take statin   3. HTN  - does not check at home  - compliant with meds   4. Cirrhosis  - reports history of fatty liver. Followed by GI  5. Preoperative evaluation - considering knee replacement - tolerates greater than 4 METs without significant symptoms,     Past Medical History:  Diagnosis Date  . Aneurysm ([facility]   right femoral pseudoaneurysm, thrombosed (following 03/20/07 cardiac cath)  . CAD, multiple vessel    PCI circumflex 2008, diffuse disease of the right and LAD treated medically.,  //   Nuclear 2010 no ischemia   //   nuclear May, 2012 no ischemia  . Cirrhosis of liver without mention of alcohol   . Contrast media allergy    pt states he is not allergy to contrast  . Diabetes mellitus without complication ([facility]   type 2  . Diverticulosis   . Dysphagia   . Ejection fraction     EF 55%, nuclear, May, 2012  . Esophageal reflux   . IBS (irritable bowel syndrome)   . Lower abdominal pain   . Mixed dyslipidemia   . Morbid obesity ([place]  . Other chronic nonalcoholic liver disease   . Postsurgical percutaneous transluminal coronary angioplasty status   . Preop cardiovascular exam    Cardiovascular clearance for prostate surgery October, 2013  . Prostate cancer ([facility]07/29/13  . Unspecified diastolic heart failure   . Unspecified essential hypertension      Allergies  Allergen Reactions  . Codeine Palpitations  . Plavix [Clopidogrel Bisulfate] Rash     Current Outpatient Medications  Medication Sig Dispense Refill  . amLODipine (NORVASC) 10 MG tablet Take 10 mg by mouth daily.     . Marland Kitchenspirin EC 81 MG tablet Take 1 tablet (81 mg total) by mouth daily.    . Marland Kitchentorvastatin (LIPITOR) 10 MG tablet Take 10 mg by mouth daily.    . famotidine (PEPCID) 20 MG tablet Take 1 tablet (20 mg total) by mouth at bedtime.    . Marland Kitchenbuprofen (ADVIL,MOTRIN) 200 MG tablet Take 400 mg by mouth every 8 (eight) hours as needed for mild pain or moderate pain.    . Marland Kitchennsulin glargine (LANTUS) 100 unit/mL SOPN Inject 85 Units into the skin at bedtime.     .Marland Kitchen  Insulin NPH Human, Isophane, (NOVOLIN N [place]) Inject into the skin. Patient states that he is taking Novolin 40 units twice a day.    . lisinopril (PRINIVIL,ZESTRIL) 40 MG tablet Take 40 mg by mouth every morning.     . metoprolol (LOPRESSOR) 50 MG tablet Take 50 mg by mouth daily.    . Multiple Vitamin (MULTI VITAMIN MENS PO) Take 1 tablet by mouth daily.     Marland Kitchen NITROSTAT 0.4 MG SL tablet Place 1 tablet under the tongue every 5 (five) minutes as needed for chest pain. For chest pains    . pantoprazole (PROTONIX) 40 MG tablet Take 40 mg by mouth 2 (two) times daily.     Marland Kitchen spironolactone (ALDACTONE) 25 MG tablet Take 25 mg by mouth 2 (two) times daily.      No current facility-administered medications for this visit.     Past Surgical  History:  Procedure Laterality Date  . BACK SURGERY     took cyst out of back  . CARDIAC CATHETERIZATION    . CARPAL TUNNEL RELEASE Bilateral   . CHOLECYSTECTOMY  06/1998  . COLONOSCOPY  03/03/03  . COLONOSCOPY  08/17/2011   Procedure: COLONOSCOPY;  Surgeon: Rogene Houston, MD;  [facility];  Service: Endoscopy;  Laterality: N/A;  1045  . COLONOSCOPY N/A 07/05/2017   Procedure: COLONOSCOPY;  Surgeon: Rogene Houston, MD;  Location: [facility];  Service: Endoscopy;  Laterality: N/A;  2:00  . ESOPHAGOGASTRODUODENOSCOPY (EGD) WITH PROPOFOL N/A 12/20/2017   Procedure: ESOPHAGOGASTRODUODENOSCOPY (EGD) WITH PROPOFOL;  Surgeon: Rogene Houston, MD;  Location: [facility];  Service: Endoscopy;  Laterality: N/A;  11:10 :  . LEFT HEART CATHETERIZATION WITH CORONARY ANGIOGRAM N/A 12/07/2013   Procedure: LEFT HEART CATHETERIZATION WITH CORONARY ANGIOGRAM;  Surgeon: Peter M Martinique, MD;  Location: [facility] CATH LAB;  Service: Cardiovascular;  Laterality: N/A;  . left rotator    . POLYPECTOMY  12/20/2017   Procedure: POLYPECTOMY;  Surgeon: Rogene Houston, MD;  Location: [facility];  Service: Endoscopy;;  Gastric (HS) x2  . RIGHT KNEE SURGERY    . ROBOT ASSISTED LAPAROSCOPIC RADICAL PROSTATECTOMY  06/26/2012   Procedure: ROBOTIC ASSISTED LAPAROSCOPIC RADICAL PROSTATECTOMY LEVEL 3;  Surgeon: Dutch Gray, MD;  Location: [facility] ORS;  Service: Urology;  Laterality: N/A;      . Two cardiac stents  2009  . UPPER GASTROINTESTINAL ENDOSCOPY  02/21/2011   EGD ED  . UPPER GASTROINTESTINAL ENDOSCOPY  03/03/03   TCS  . VASECTOMY       Allergies  Allergen Reactions  . Codeine Palpitations  . Plavix [Clopidogrel Bisulfate] Rash      Family History  Problem Relation Age of Onset  . Heart disease Mother   . Breast cancer Mother   . Heart disease Father   . Breast cancer Sister   . Ovarian cancer Sister   . Heart disease Brother   . Healthy Daughter   . Healthy Daughter   . Healthy  Daughter   . Healthy Daughter   . Coronary artery disease Unknown        family history     Social History [name] reports that he quit smoking about 21 years ago. His smoking use included cigarettes. He has a 90.00 pack-year smoking history. He has never used smokeless tobacco. [name] reports no history of alcohol use.   Review of Systems CONSTITUTIONAL: No weight loss, fever, chills, weakness or fatigue.  HEENT: Eyes: No visual loss,  blurred vision, double vision or yellow sclerae.No hearing loss, sneezing, congestion, runny nose or sore throat.  SKIN: No rash or itching.  CARDIOVASCULAR: per hpi RESPIRATORY: No shortness of breath, cough or sputum.  GASTROINTESTINAL: No anorexia, nausea, vomiting or diarrhea. No abdominal pain or blood.  GENITOURINARY: No burning on urination, no polyuria NEUROLOGICAL: No headache, dizziness, syncope, paralysis, ataxia, numbness or tingling in the extremities. No change in bowel or bladder control.  MUSCULOSKELETAL: No muscle, back pain, joint pain or stiffness.  LYMPHATICS: No enlarged nodes. No history of splenectomy.  PSYCHIATRIC: No history of depression or anxiety.  ENDOCRINOLOGIC: No reports of sweating, cold or heat intolerance. No polyuria or polydipsia.  Marland Kitchen   Physical Examination Today's Vitals   11/24/19 0845  BP: 138/76  Pulse: 74  SpO2: 98%  Weight: (!) 312 lb (141.5 kg)  Height: 6' 2"  (1.88 m)   Body mass index is 40.06 kg/m.  Gen: resting comfortably, no acute distress HEENT: no scleral icterus, pupils equal round and reactive, no palptable cervical adenopathy,  CV: RRR, no m/r/g, no jvd Resp: Clear to auscultation bilaterally GI: abdomen is soft, non-tender, non-distended, normal bowel sounds, no hepatosplenomegaly MSK: extremities are warm, no edema.  Skin: warm, no rash Neuro:  no focal deficits Psych: appropriate affect   Diagnostic Studies 02/2007 Cath  FINDINGS: Aortic pressure 131/85 with a mean of  106, left ventricular  pressure is 128/16.  The left mainstem has luminal irregularities but no significant  angiographic stenosis.  The left mainstem bifurcates into the LAD and left circumflex. The LAD  is a large-caliber vessel that courses down and reaches the LV apex.  Proximal portion of the LAD in the area of the first septal perforator  has a diffuse 50% stenosis. There is a diagonal Astou Lada that arises from  this area. The diagonal is widely patent. The remaining portions of  the mid and distal LAD have diffuse nonobstructive plaque without  significant focal stenosis.  The left circumflex is a large-caliber caliber vessel. It gives off a  large first OM Ilda Laskin that has an 80% stenosis in its proximal portion.  The AV groove circumflex continues down and provides a small left  posterolateral Tyjah Hai. There is no significant angiographic disease in  the AV groove circumflex.  The right coronary artery is diffusely diseased. There are luminal  irregularities in the proximal portion. The midportion in the stented  segment that is ectatic. Distally, there is diffuse disease with 50%  stenosis just before the bifurcation of the PDA and posterior AV  segment. The PDA and posterolateral branches have no significant  angiographic disease  Left ventriculography demonstrates normal LV systolic function with an  LVEF of 60%. There is no mitral regurgitation.  ASSESSMENT:  1. Severe obtuse marginal 1 stenosis.  2. Moderate diffuse disease in the left anterior descending artery and  right coronary artery.  3. Normal left ventricular systolic function.  4. Successful percutaneous coronary intervention of the left  circumflex with a bare-metal stent.  Will continue aspirin indefinitely. The patient should be on  ticlopidine for 30 days with close monitoring of his CBC.   02/2013 Lexiscan MPI  Small partially reversible apical to mid anteroseptal defect. Partially reversible mid anterior  defect. Fixed mid to basal inferior wall defect. Normal wall motion, thought to be artifact/noted soft tissue attenuation   11/17/13 clinic EKG  NSR,    11/2013 Cath Procedural Findings:  Hemodynamics:  AO 110/62 mean 79 mm Hg  LV 113/12  mm Hg  Coronary angiography:  Coronary dominance: right  Left mainstem: Normal  Left anterior descending (LAD): 30% disease in the proximal vessel. The first diagonal is normal.  Left circumflex (LCx): The LCx gives rise to a large OM 1, a small OM2, and terminates in a posterolateral Gilberte Gorley. The stent in the OM 1 is widely patent. No other significant disease in the LCx.  Right coronary artery (RCA): Large vessel. There is 30% disease in the proximal vessel, at the crux, and in the distal RCA.  Left ventriculography: Left ventricular systolic function is normal, LVEF is estimated at 55-65%, there is no significant mitral regurgitation  Final Conclusions:  1. Nonobstructive CAD. The prior stent in OM1 is widely patent.  2. Normal LV function.  Recommendations: Continue medical therapy.   03/2019 echo [facility] Summary  1. Echo contrast utilized to enhance endocardial border definition.  2. The left ventricle is normal in size with mildly increased wall thickness.  3. Normal left ventricular systolic function, ejection fraction 60-65%.  4. Normal right ventricular size and systolic function.  5. The aortic valve is trileaflet with mildly thickened leaflets with normal excursion.  6. Aortic root borderline dilated.  7. Ascending aorta borderline dilated.  Assessment and Plan   1. CAD  - 04/2019 nuclear stress moderate inferior ischemia - no current symptoms - continue current medical therapy, change lopressor to 59m bid  2. Hyperlipidemia  - has not been on statin, he is reluctant due to his liver history - will reach out to Dr RLaural Goldenfor his thoughts  3. HTN  -reasonable control, continue current meds  4. LE edema -fairly  [facility] normal LVEF, did have grade I diastolic dysfunction though not mentioned in report. , would appear to be related to obesity, perhaps some venous insufficiency or his liver disease - his prior cardiolgoist notes mention he was on lasix but patient denies ever being on - no cardiopulmonary symptoms. He reports the edema is actually improving with weight loss - can consider diuretic if swelling worsens.    Addendum: heard back from Dr RLaural Golden he would be ok with starting a statin. We will make patient aware and see if this influences his decision to start.    JArnoldo Lenis M.D.

## 2019-12-02 ENCOUNTER — Telehealth: Payer: Self-pay | Admitting: *Deleted

## 2019-12-02 NOTE — Telephone Encounter (Signed)
LM to return call     Please let patient know that I heard back from Dr Laural Golden and he is ok with starting a statin. I know the patient was hesitant, if he decides to start let us know    Zandra Abts MD

## 2019-12-03 ENCOUNTER — Other Ambulatory Visit: Payer: Self-pay

## 2019-12-03 ENCOUNTER — Ambulatory Visit (INDEPENDENT_AMBULATORY_CARE_PROVIDER_SITE_OTHER): Payer: Medicare Other | Admitting: Orthopaedic Surgery

## 2019-12-03 ENCOUNTER — Encounter: Payer: Self-pay | Admitting: Orthopaedic Surgery

## 2019-12-03 VITALS — BP 147/83 | HR 62

## 2019-12-03 DIAGNOSIS — M1711 Unilateral primary osteoarthritis, right knee: Secondary | ICD-10-CM | POA: Diagnosis not present

## 2019-12-03 DIAGNOSIS — M1712 Unilateral primary osteoarthritis, left knee: Secondary | ICD-10-CM | POA: Diagnosis not present

## 2019-12-03 DIAGNOSIS — M170 Bilateral primary osteoarthritis of knee: Secondary | ICD-10-CM

## 2019-12-03 MED ORDER — BUPIVACAINE HCL 0.25 % IJ SOLN
4.0000 mL | INTRAMUSCULAR | Status: AC | PRN
  Administered 2019-12-03: 4 mL via INTRA_ARTICULAR

## 2019-12-03 MED ORDER — BUPIVACAINE HCL 0.5 % IJ SOLN
3.0000 mL | INTRAMUSCULAR | Status: AC | PRN
  Administered 2019-12-03: 3 mL via INTRA_ARTICULAR

## 2019-12-03 MED ORDER — METHYLPREDNISOLONE ACETATE 40 MG/ML IJ SUSP
40.0000 mg | INTRAMUSCULAR | Status: AC | PRN
  Administered 2019-12-03: 40 mg via INTRA_ARTICULAR

## 2019-12-03 MED ORDER — LIDOCAINE HCL 1 % IJ SOLN
0.5000 mL | INTRAMUSCULAR | Status: AC | PRN
  Administered 2019-12-03: .5 mL

## 2019-12-03 NOTE — Progress Notes (Addendum)
Office Visit Note   Patient: Billy Davis           Date of Birth: [birth year]           MRN: [id number] Visit Date: 12/03/2019              Requested by: [name], MD [address] [place],  [place] [address] PCP: [name], MD   Assessment & Plan: Visit Diagnoses:  1. Bilateral primary osteoarthritis of knee     Plan: Bilateral knee injection performed to watch his sugars closely up his insulin to control this as he is done in the past.  We discussed getting his A1c below 7.5 and then he can proceed with total knee arthroplasty.  Recheck 3 months and on return we will get standing AP x-rays both knees and a lateral right and left knee x-ray.  Follow-Up Instructions: Return in about 3 months (around 03/03/2020).   Orders:  Orders Placed This Encounter  Procedures  . Large Joint Inj: R knee  . Large Joint Inj: L knee   No orders of the defined types were placed in this encounter.     Procedures: Large Joint Inj: R knee on 12/03/2019 10:56 AM Indications: pain and joint swelling Details: 22 G 1.5 in needle, anterolateral approach  Arthrogram: No  Medications: 40 mg methylPREDNISolone acetate 40 MG/ML; 0.5 mL lidocaine 1 %; 4 mL bupivacaine 0.25 % Outcome: tolerated well, no immediate complications Procedure, treatment alternatives, risks and benefits explained, specific risks discussed. Consent was given by the patient. Immediately prior to procedure a time out was called to verify the correct patient, procedure, equipment, support staff and site/side marked as required. Patient was prepped and draped in the usual sterile fashion.   Large Joint Inj: L knee on 12/03/2019 10:56 AM Indications: joint swelling and pain Details: 22 G 1.5 in needle, anterolateral approach  Arthrogram: No  Medications: 0.5 mL lidocaine 1 %; 3 mL bupivacaine 0.5 %; 40 mg methylPREDNISolone acetate 40 MG/ML Outcome: tolerated well, no immediate complications Procedure, treatment alternatives, risks  and benefits explained, specific risks discussed. Consent was given by the patient. Immediately prior to procedure a time out was called to verify the correct patient, procedure, equipment, support staff and site/side marked as required. Patient was prepped and draped in the usual sterile fashion.    60 cc aspirated left knee clear yellow.    Clinical Data: No additional findings.   Subjective: Chief Complaint  Patient presents with  . Left Knee - Pain  . Right Knee - Pain    HPI 64 year old male returns with bilateral knee osteoarthritis.  He has significant effusion on his left knee and mild on the right.  He is requesting aspiration injection of his knee.  He has lost weight BMI is under 40 now.  Last A1c was 9.3 and in the past he has had his A1c below 7.  Patient is trying to reach his goals for A1c currently so we can proceed with total knee arthroplasty.  Review of Systems 14 point systems updated unchanged from last office visit.   Objective: Vital Signs: BP (!) 147/83   Pulse 62   Physical Exam Constitutional:      Appearance: He is well-developed.  HENT:     Head: Normocephalic and atraumatic.  Eyes:     Pupils: Pupils are equal, round, and reactive to light.  Neck:     Thyroid: No thyromegaly.     Trachea: No tracheal deviation.  Cardiovascular:     Rate and Rhythm: Normal rate.  Pulmonary:     Effort: Pulmonary effort is normal.     Breath sounds: No wheezing.  Abdominal:     General: Bowel sounds are normal.     Palpations: Abdomen is soft.  Skin:    General: Skin is warm and dry.     Capillary Refill: Capillary refill takes less than 2 seconds.  Neurological:     Mental Status: He is alert and oriented to person, place, and time.  Psychiatric:        Behavior: Behavior normal.        Thought Content: Thought content normal.        Judgment: Judgment normal.     Ortho Exam bilateral knee crepitus.  Right knee lacks 10 degrees reaching full  extension.  Medial lateral joint tenderness.  Left knee has 3+ effusion.  Crepitus with range of motion has bilateral pitting edema right and left.  Specialty Comments:  No specialty comments available.  Imaging: No results found.   PMFS History: Patient Active Problem List   Diagnosis Date Noted  . Bilateral leg edema 10/26/2019  . Erroneous encounter - disregard 10/09/2019  . Synovitis of left knee 08/27/2019  . Body mass index 40.0-44.9, adult ([place]) 08/27/2019  . Unilateral primary osteoarthritis, left knee 08/13/2019  . Esophageal varices without bleeding ([place]) 06/22/2019  . Cirrhosis, non-alcoholic ([place]) 29/92/4268  . Unilateral primary osteoarthritis, right knee 10/24/2017  . Rectal bleeding 06/06/2017  . 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 DM (diabetes mellitus) ([place]) 11/09/2013  . Unspecified diastolic heart failure   . Morbid obesity ([place])   . IBS (irritable bowel syndrome)   . Ejection fraction   . CAD, multiple vessel   . Contrast media allergy   . Aneurysm ([place])   . Preop cardiovascular exam   . Prostate cancer ([place]) 03/24/2012  . Diastolic dysfunction 34/19/6222  . GERD (gastroesophageal reflux disease) 12/27/2010  . Hypertension 12/27/2010  . DYSLIPIDEMIA 12/26/2009  . FATTY LIVER DISEASE 12/26/2009   Past Medical History:  Diagnosis Date  . Aneurysm ([place])    right femoral pseudoaneurysm, thrombosed (following 03/20/07 cardiac cath)  . CAD, multiple vessel    PCI circumflex 2008, diffuse disease of the right and LAD treated medically.,  //   Nuclear 2010 no ischemia   //   nuclear May, 2012 no ischemia  . Cirrhosis of liver without mention of alcohol   . Contrast media allergy    pt states he is not allergy to contrast  . Diabetes mellitus without complication ([place])    type 2  . Diverticulosis   . Dysphagia   . Ejection fraction    EF 55%, nuclear, May, 2012  . Esophageal reflux   . IBS (irritable bowel syndrome)   .  Lower abdominal pain   . Mixed dyslipidemia   . Morbid obesity ([place])   . Other chronic nonalcoholic liver disease   . Postsurgical percutaneous transluminal coronary angioplasty status   . Preop cardiovascular exam    Cardiovascular clearance for prostate surgery October, 2013  . Prostate cancer ([place]) 03/24/12  . Unspecified diastolic heart failure   . Unspecified essential hypertension     Family History  Problem Relation Age of Onset  . Heart disease Mother   . Breast cancer Mother   . Heart disease Father   . Breast cancer Sister   . Ovarian cancer Sister   . Heart disease Brother   .  Healthy Daughter   . Healthy Daughter   . Healthy Daughter   . Healthy Daughter   . Coronary artery disease Unknown        family history    Past Surgical History:  Procedure Laterality Date  . BACK SURGERY     took cyst out of back  . CARDIAC CATHETERIZATION    . CARPAL TUNNEL RELEASE Bilateral   . CHOLECYSTECTOMY  06/1998  . COLONOSCOPY  03/03/03  . COLONOSCOPY  08/17/2011   Procedure: COLONOSCOPY;  Surgeon: Rogene Houston, MD;  [facility];  Service: Endoscopy;  Laterality: N/A;  1045  . COLONOSCOPY N/A 07/05/2017   Procedure: COLONOSCOPY;  Surgeon: Rogene Houston, MD;  [facility];  Service: Endoscopy;  Laterality: N/A;  2:00  . ESOPHAGOGASTRODUODENOSCOPY (EGD) WITH PROPOFOL N/A 12/20/2017   Procedure: ESOPHAGOGASTRODUODENOSCOPY (EGD) WITH PROPOFOL;  Surgeon: Rogene Houston, MD;  [facility];  Service: Endoscopy;  Laterality: N/A;  11:10 :  . LEFT HEART CATHETERIZATION WITH CORONARY ANGIOGRAM N/A 12/07/2013   Procedure: LEFT HEART CATHETERIZATION WITH CORONARY ANGIOGRAM;  Surgeon: Peter M Martinique, MD;  Location[facility] LAB;  Service: Cardiovascular;  Laterality: N/A;  . left rotator    . POLYPECTOMY  12/20/2017   Procedure: POLYPECTOMY;  Surgeon: Rogene Houston, MD;  [facility];  Service: Endoscopy;;  Gastric (HS) x2  . RIGHT KNEE  SURGERY    . ROBOT ASSISTED LAPAROSCOPIC RADICAL PROSTATECTOMY  06/26/2012   Procedure: ROBOTIC ASSISTED LAPAROSCOPIC RADICAL PROSTATECTOMY LEVEL 3;  Surgeon: Dutch Gray, MD;  [facility];  Service: Urology;  Laterality: N/A;      . Two cardiac stents  2009  . UPPER GASTROINTESTINAL ENDOSCOPY  02/21/2011   EGD ED  . UPPER GASTROINTESTINAL ENDOSCOPY  03/03/03   TCS  . VASECTOMY     Social History   Occupational History  . Occupation: [employer]  Tobacco Use  . Smoking status: Former Smoker    Packs/day: 3.00    Years: 30.00    Pack years: 90.00    Types: Cigarettes    Quit date: 08/27/1998    Years since quitting: 21.2  . Smokeless tobacco: Never Used  . Tobacco comment: Pt quit smoking 10 yrs ago, smoked for about 30 yrs.  Substance and Sexual Activity  . Alcohol use: No    Alcohol/week: 0.0 standard drinks  . Drug use: No  . Sexual activity: Not on file

## 2019-12-03 NOTE — Telephone Encounter (Signed)
Pt voiced understanding and will call us back if he decides to start a statin

## 2020-01-21 ENCOUNTER — Other Ambulatory Visit: Payer: Self-pay

## 2020-01-21 ENCOUNTER — Ambulatory Visit: Payer: Medicare Other | Admitting: Orthopaedic Surgery

## 2020-03-03 ENCOUNTER — Ambulatory Visit (INDEPENDENT_AMBULATORY_CARE_PROVIDER_SITE_OTHER): Payer: Medicare Other | Admitting: Orthopaedic Surgery

## 2020-03-03 ENCOUNTER — Ambulatory Visit: Payer: Self-pay

## 2020-03-03 ENCOUNTER — Other Ambulatory Visit: Payer: Self-pay

## 2020-03-03 ENCOUNTER — Encounter: Payer: Self-pay | Admitting: Orthopaedic Surgery

## 2020-03-03 VITALS — Ht 74.0 in | Wt 287.0 lb

## 2020-03-03 DIAGNOSIS — M545 Low back pain: Secondary | ICD-10-CM | POA: Diagnosis not present

## 2020-03-03 DIAGNOSIS — M25561 Pain in right knee: Secondary | ICD-10-CM

## 2020-03-03 DIAGNOSIS — M1712 Unilateral primary osteoarthritis, left knee: Secondary | ICD-10-CM | POA: Diagnosis not present

## 2020-03-03 DIAGNOSIS — M25562 Pain in left knee: Secondary | ICD-10-CM

## 2020-03-03 DIAGNOSIS — M4316 Spondylolisthesis, lumbar region: Secondary | ICD-10-CM

## 2020-03-03 DIAGNOSIS — M1711 Unilateral primary osteoarthritis, right knee: Secondary | ICD-10-CM

## 2020-03-03 DIAGNOSIS — M5136 Other intervertebral disc degeneration, lumbar region: Secondary | ICD-10-CM

## 2020-03-03 DIAGNOSIS — G8929 Other chronic pain: Secondary | ICD-10-CM

## 2020-03-03 NOTE — Progress Notes (Signed)
Office Visit Note   Patient: Billy Davis           Date of Birth: 1955-10-20           MRN: [id number] Visit Date: 03/03/2020              Requested by: [name], MD [address] [place],  [place] [address] PCP: [name], MD   Assessment & Plan: Visit Diagnoses:  1. Chronic bilateral low back pain, unspecified whether sciatica present   2. Chronic pain of both knees   3. Unilateral primary osteoarthritis, left knee   4. Lumbar adjacent segment disease with spondylolisthesis   5. Unilateral primary osteoarthritis, right knee     Plan: We discussed with him work-up for his back would require imaging studies myelogram CT scan and he may require adjacent level fusion at L4-5 where he has some progressive anterolisthesis and progressive foraminal stenosis.  He has worse left than right knee osteoarthritic symptoms.  He understands total knee arthroplasty would be needed to help with the symptoms since he is already been on anti-inflammatories and has been through weight loss, previous injections.  He is continue to work on weight loss and walking.  He has no neurogenic claudication symptoms currently.  He will return when he has increased problems with either his back or his knee.  Follow-Up Instructions: No follow-ups on file.   Orders:  Orders Placed This Encounter  Procedures  . XR Lumbar Spine 2-3 Views  . XR Knee 1-2 Views Right  . XR Knee 1-2 Views Left   No orders of the defined types were placed in this encounter.     Procedures: No procedures performed   Clinical Data: No additional findings.   Subjective: Chief Complaint  Patient presents with  . Left Knee - Follow-up    HPI 64 year old male returns he is having problems with his back after previous L5-S1 fusion and decompression up to L2.  This was done by Dr. Joya Salm and 2017.  He has had persistent problems with right leg numbness since the surgery and CT scan showed right screw at L5 was immediately  adjacent to the nerve.  He has had problems with knee pain worse on the left than right and A1c is now below 7.5 and is lost more than 50 pounds.  BMI is now 36.  He states his diabetes is improved with the weight loss.  He has had problems when he gets down on his knees can get up off the floor.  Is working with his chickens when he does bending twisting he has increased back pain.  He still able to walk several blocks.  Review of Systems positive for diabetes weight loss with previous morbid obesity.  Bilateral knee pain worse on the left than right with arthritis.  Previous carpal tunnel releases with resolution of tingling.  14 systems otherwise noncontributory.   Objective: Vital Signs: Ht 6' 2"  (1.88 m)   Wt 287 lb (130.2 kg)   BMI 36.85 kg/m   Physical Exam Constitutional:      Appearance: He is well-developed.  HENT:     Head: Normocephalic and atraumatic.  Eyes:     Pupils: Pupils are equal, round, and reactive to light.  Neck:     Thyroid: No thyromegaly.     Trachea: No tracheal deviation.  Cardiovascular:     Rate and Rhythm: Normal rate.  Pulmonary:     Effort: Pulmonary effort is normal.  Breath sounds: No wheezing.  Abdominal:     General: Bowel sounds are normal.     Palpations: Abdomen is soft.  Skin:    General: Skin is warm and dry.     Capillary Refill: Capillary refill takes less than 2 seconds.  Neurological:     Mental Status: He is alert and oriented to person, place, and time.  Psychiatric:        Behavior: Behavior normal.        Thought Content: Thought content normal.        Judgment: Judgment normal.     Ortho Exam crepitus with knee range of motion on the left and right more medial than lateral joint line tenderness left greater than right knee.  Negative logroll of the hips negative straight leg raising 90 degrees.  Well-healed lumbar incision.  Specialty Comments:  No specialty comments available.  Imaging: XR Knee 1-2 Views  Left  Result Date: 03/03/2020 Standing AP both knees lateral right knee and sunrise patellar x-ray obtained and reviewed.  This shows 80% loss of joint space left knee medially.  Marginal osteophytes and patella femoral irregularity. Assessment: Moderate knee osteoarthritis worse patellofemoral and medial compartment left knee.  XR Knee 1-2 Views Right  Result Date: 03/03/2020 Standing AP both knees lateral right knee bilateral sunrise patellar x-rays reviewed.  This shows mild to moderate right knee osteoarthritis negative for acute changes. Impression: Mild to moderate right knee osteoarthritis.  XR Lumbar Spine 2-3 Views  Result Date: 03/03/2020 2 view lumbar spine demonstrates instrumented fusion L5-S1.  Right L5 screw.  Is a low.  No hardware loosening.  Grade 1 anterolisthesis L4-5 progressed from 2018 images.  Decompression up at L2 with central laminectomy noted. Impression: Previous L5-S1 fusion with pedicle screws.  Progressive degenerative anterolisthesis L4-5.    PMFS History: Patient Active Problem List   Diagnosis Date Noted  . Bilateral leg edema 10/26/2019  . Erroneous encounter - disregard 10/09/2019  . Synovitis of left knee 08/27/2019  . Body mass index 40.0-44.9, adult ([place]) 08/27/2019  . Unilateral primary osteoarthritis, left knee 08/13/2019  . Esophageal varices without bleeding ([place]) 06/22/2019  . 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 Unilateral primary osteoarthritis, right knee 10/24/2017  . Rectal bleeding 06/06/2017  . 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 DM (diabetes mellitus) ([place]) 11/09/2013  . Unspecified diastolic heart failure   . Morbid obesity ([place])   . IBS (irritable bowel syndrome)   . Ejection fraction   . CAD, multiple vessel   . Contrast media allergy   . Aneurysm ([place])   . Preop cardiovascular exam   . Prostate cancer ([place]) 03/24/2012  . Diastolic dysfunction 78/29/5621  . GERD (gastroesophageal reflux  disease) 12/27/2010  . Hypertension 12/27/2010  . DYSLIPIDEMIA 12/26/2009  . FATTY LIVER DISEASE 12/26/2009   Past Medical History:  Diagnosis Date  . Aneurysm ([place])    right femoral pseudoaneurysm, thrombosed (following 03/20/07 cardiac cath)  . CAD, multiple vessel    PCI circumflex 2008, diffuse disease of the right and LAD treated medically.,  //   Nuclear 2010 no ischemia   //   nuclear May, 2012 no ischemia  . Cirrhosis of liver without mention of alcohol   . Contrast media allergy    pt states he is not allergy to contrast  . Diabetes mellitus without complication ([place])    type 2  . Diverticulosis   . Dysphagia   . Ejection fraction    EF 55%,  nuclear, May, 2012  . Esophageal reflux   . IBS (irritable bowel syndrome)   . Lower abdominal pain   . Mixed dyslipidemia   . Morbid obesity ([place])   . Other chronic nonalcoholic liver disease   . Postsurgical percutaneous transluminal coronary angioplasty status   . Preop cardiovascular exam    Cardiovascular clearance for prostate surgery October, 2013  . Prostate cancer ([place]) 03/24/12  . Unspecified diastolic heart failure   . Unspecified essential hypertension     Family History  Problem Relation Age of Onset  . Heart disease Mother   . Breast cancer Mother   . Heart disease Father   . Breast cancer Sister   . Ovarian cancer Sister   . Heart disease Brother   . Healthy Daughter   . Healthy Daughter   . Healthy Daughter   . Healthy Daughter   . Coronary artery disease Unknown        family history    Past Surgical History:  Procedure Laterality Date  . BACK SURGERY     took cyst out of back  . CARDIAC CATHETERIZATION    . CARPAL TUNNEL RELEASE Bilateral   . CHOLECYSTECTOMY  06/1998  . COLONOSCOPY  03/03/03  . COLONOSCOPY  08/17/2011   Procedure: COLONOSCOPY;  Surgeon: Rogene Houston, MD;  [facility];  Service: Endoscopy;  Laterality: N/A;  1045  . COLONOSCOPY N/A 07/05/2017   Procedure:  COLONOSCOPY;  Surgeon: Rogene Houston, MD;  [facility];  Service: Endoscopy;  Laterality: N/A;  2:00  . ESOPHAGOGASTRODUODENOSCOPY (EGD) WITH PROPOFOL N/A 12/20/2017   Procedure: ESOPHAGOGASTRODUODENOSCOPY (EGD) WITH PROPOFOL;  Surgeon: Rogene Houston, MD;  [facility];  Service: Endoscopy;  Laterality: N/A;  11:10 :  . LEFT HEART CATHETERIZATION WITH CORONARY ANGIOGRAM N/A 12/07/2013   Procedure: LEFT HEART CATHETERIZATION WITH CORONARY ANGIOGRAM;  Surgeon: Peter M Martinique, MD;  Location: [facility] CATH LAB;  Service: Cardiovascular;  Laterality: N/A;  . left rotator    . POLYPECTOMY  12/20/2017   Procedure: POLYPECTOMY;  Surgeon: Rogene Houston, MD;  [facility];  Service: Endoscopy;;  Gastric (HS) x2  . RIGHT KNEE SURGERY    . ROBOT ASSISTED LAPAROSCOPIC RADICAL PROSTATECTOMY  06/26/2012   Procedure: ROBOTIC ASSISTED LAPAROSCOPIC RADICAL PROSTATECTOMY LEVEL 3;  Surgeon: Dutch Gray, MD;  [facility];  Service: Urology;  Laterality: N/A;      . Two cardiac stents  2009  . UPPER GASTROINTESTINAL ENDOSCOPY  02/21/2011   EGD ED  . UPPER GASTROINTESTINAL ENDOSCOPY  03/03/03   TCS  . VASECTOMY     Social History   Occupational History  . Occupation: [employer]  Tobacco Use  . Smoking status: Former Smoker    Packs/day: 3.00    Years: 30.00    Pack years: 90.00    Types: Cigarettes    Quit date: 08/27/1998    Years since quitting: 21.5  . Smokeless tobacco: Never Used  . Tobacco comment: Pt quit smoking 10 yrs ago, smoked for about 30 yrs.  Substance and Sexual Activity  . Alcohol use: No    Alcohol/week: 0.0 standard drinks  . Drug use: No  . Sexual activity: Not on file

## 2020-04-28 ENCOUNTER — Ambulatory Visit (INDEPENDENT_AMBULATORY_CARE_PROVIDER_SITE_OTHER): Payer: Medicare Other | Admitting: Orthopaedic Surgery

## 2020-04-28 ENCOUNTER — Other Ambulatory Visit: Payer: Self-pay

## 2020-04-28 ENCOUNTER — Encounter: Payer: Self-pay | Admitting: Orthopaedic Surgery

## 2020-04-28 VITALS — BP 152/87 | HR 61 | Ht 74.0 in | Wt 286.6 lb

## 2020-04-28 DIAGNOSIS — M1712 Unilateral primary osteoarthritis, left knee: Secondary | ICD-10-CM | POA: Diagnosis not present

## 2020-04-28 NOTE — Progress Notes (Deleted)
Office Visit Note   Patient: Billy Davis           Date of Birth: 08-02-1956           MRN: [id number] Visit Date: 04/28/2020              Requested by: [name], MD [address] [place],  [place] [address] PCP: [name], MD   Assessment & Plan: Visit Diagnoses: No diagnosis found.  Plan: ***  Follow-Up Instructions: No follow-ups on file.   Orders:  No orders of the defined types were placed in this encounter.  No orders of the defined types were placed in this encounter.     Procedures: No procedures performed   Clinical Data: No additional findings.   Subjective: Chief Complaint  Patient presents with  . Left Knee - Pain    HPI  Review of Systems   Objective: Vital Signs: BP (!) 152/87   Pulse 61   Ht 6' 2"  (1.88 m)   Wt 286 lb 9.6 oz (130 kg)   BMI 36.80 kg/m   Physical Exam  Ortho Exam  Specialty Comments:  No specialty comments available.  Imaging: No results found.   PMFS History: Patient Active Problem List   Diagnosis Date Noted  . Bilateral leg edema 10/26/2019  . Erroneous encounter - disregard 10/09/2019  . Synovitis of left knee 08/27/2019  . Body mass index 40.0-44.9, adult ([place]) 08/27/2019  . Unilateral primary osteoarthritis, left knee 08/13/2019  . Esophageal varices without bleeding ([place]) 06/22/2019  . Cirrhosis, non-alcoholic ([place]) 54/56/2563  . Unilateral primary osteoarthritis, right knee 10/24/2017  . Rectal bleeding 06/06/2017  . 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 DM (diabetes mellitus) ([place]) 11/09/2013  . Unspecified diastolic heart failure   . Morbid obesity ([place])   . IBS (irritable bowel syndrome)   . Ejection fraction   . CAD, multiple vessel   . Contrast media allergy   . Aneurysm ([place])   . Preop cardiovascular exam   . Prostate cancer ([place]) 03/24/2012  . Diastolic dysfunction 89/37/3428  . GERD (gastroesophageal reflux disease) 12/27/2010  . Hypertension  12/27/2010  . DYSLIPIDEMIA 12/26/2009  . FATTY LIVER DISEASE 12/26/2009   Past Medical History:  Diagnosis Date  . Aneurysm ([place])    right femoral pseudoaneurysm, thrombosed (following 03/20/07 cardiac cath)  . CAD, multiple vessel    PCI circumflex 2008, diffuse disease of the right and LAD treated medically.,  //   Nuclear 2010 no ischemia   //   nuclear May, 2012 no ischemia  . Cirrhosis of liver without mention of alcohol   . Contrast media allergy    pt states he is not allergy to contrast  . Diabetes mellitus without complication ([place])    type 2  . Diverticulosis   . Dysphagia   . Ejection fraction    EF 55%, nuclear, May, 2012  . Esophageal reflux   . IBS (irritable bowel syndrome)   . Lower abdominal pain   . Mixed dyslipidemia   . Morbid obesity ([place])   . Other chronic nonalcoholic liver disease   . Postsurgical percutaneous transluminal coronary angioplasty status   . Preop cardiovascular exam    Cardiovascular clearance for prostate surgery October, 2013  . Prostate cancer ([place]) 03/24/12  . Unspecified diastolic heart failure   . Unspecified essential hypertension     Family History  Problem Relation Age of Onset  . Heart disease Mother   . Breast cancer  Mother   . Heart disease Father   . Breast cancer Sister   . Ovarian cancer Sister   . Heart disease Brother   . Healthy Daughter   . Healthy Daughter   . Healthy Daughter   . Healthy Daughter   . Coronary artery disease Unknown        family history    Past Surgical History:  Procedure Laterality Date  . BACK SURGERY     took cyst out of back  . CARDIAC CATHETERIZATION    . CARPAL TUNNEL RELEASE Bilateral   . CHOLECYSTECTOMY  06/1998  . COLONOSCOPY  03/03/03  . COLONOSCOPY  08/17/2011   Procedure: COLONOSCOPY;  Surgeon: Rogene Houston, MD;  [facility];  Service: Endoscopy;  Laterality: N/A;  1045  . COLONOSCOPY N/A 07/05/2017   Procedure: COLONOSCOPY;  Surgeon: Rogene Houston, MD;   [facility];  Service: Endoscopy;  Laterality: N/A;  2:00  . ESOPHAGOGASTRODUODENOSCOPY (EGD) WITH PROPOFOL N/A 12/20/2017   Procedure: ESOPHAGOGASTRODUODENOSCOPY (EGD) WITH PROPOFOL;  Surgeon: Rogene Houston, MD;  [facility];  Service: Endoscopy;  Laterality: N/A;  11:10 :  . LEFT HEART CATHETERIZATION WITH CORONARY ANGIOGRAM N/A 12/07/2013   Procedure: LEFT HEART CATHETERIZATION WITH CORONARY ANGIOGRAM;  Surgeon: Peter M Martinique, MD;  Location: [facility] CATH LAB;  Service: Cardiovascular;  Laterality: N/A;  . left rotator    . POLYPECTOMY  12/20/2017   Procedure: POLYPECTOMY;  Surgeon: Rogene Houston, MD;  [facility];  Service: Endoscopy;;  Gastric (HS) x2  . RIGHT KNEE SURGERY    . ROBOT ASSISTED LAPAROSCOPIC RADICAL PROSTATECTOMY  06/26/2012   Procedure: ROBOTIC ASSISTED LAPAROSCOPIC RADICAL PROSTATECTOMY LEVEL 3;  Surgeon: Dutch Gray, MD;  [facility];  Service: Urology;  Laterality: N/A;      . Two cardiac stents  2009  . UPPER GASTROINTESTINAL ENDOSCOPY  02/21/2011   EGD ED  . UPPER GASTROINTESTINAL ENDOSCOPY  03/03/03   TCS  . VASECTOMY     Social History   Occupational History  . Occupation: [employer]  Tobacco Use  . Smoking status: Former Smoker    Packs/day: 3.00    Years: 30.00    Pack years: 90.00    Types: Cigarettes    Quit date: 08/27/1998    Years since quitting: 21.6  . Smokeless tobacco: Never Used  . Tobacco comment: Pt quit smoking 10 yrs ago, smoked for about 30 yrs.  Substance and Sexual Activity  . Alcohol use: No    Alcohol/week: 0.0 standard drinks  . Drug use: No  . Sexual activity: Not on file

## 2020-05-03 NOTE — Progress Notes (Signed)
Office Visit Note   Patient: Billy Davis           Date of Birth: March 18, 1956           MRN: [id number] Visit Date: 04/28/2020              Requested by: [name], MD [address] [place],  [place] [address] PCP: [name], MD   Assessment & Plan: Visit Diagnoses:  1. Unilateral primary osteoarthritis, left knee     Plan: Patient would like to proceed with left total knee arthroplasty.  He has lost weight and is improved his diabetic control in order to become a surgical candidate.  He needs cardiology clearance with his past history and is seen at [facility].  Procedure discussed with overnight stay in the hospital use of a walker, home physical therapy, outpatient therapy.  We discussed the importance of good compliance with the therapy.  Follow-Up Instructions: No follow-ups on file.   Orders:  No orders of the defined types were placed in this encounter.  No orders of the defined types were placed in this encounter.     Procedures: No procedures performed   Clinical Data: No additional findings.   Subjective: Chief Complaint  Patient presents with  . Left Knee - Pain    HPI 64 yo male returns And states he is ready to proceed with left  total knee arthroplasty.  He has had difficulty with previous injections with cortisone and anti-inflammatories are no longer effective.  His work on weight loss and previously his BMI was greater than 40 and now is down to 36.  A1c in the past was 9.4 and he states currently he thinks it is below 7.5.  Review of Systems positive for type 2 diabetes fatty liver disease knee osteoarthritis lumbar spondylosis, hyperlipidemia.   Objective: Vital Signs: BP (!) 152/87   Pulse 61   Ht 6' 2"  (1.88 m)   Wt 286 lb 9.6 oz (130 kg)   BMI 36.80 kg/m   Physical Exam Constitutional:      Appearance: He is well-developed.  HENT:     Head: Normocephalic and atraumatic.  Eyes:     Pupils: Pupils are equal, round, and reactive to  light.  Neck:     Thyroid: No thyromegaly.     Trachea: No tracheal deviation.  Cardiovascular:     Rate and Rhythm: Normal rate.  Pulmonary:     Effort: Pulmonary effort is normal.     Breath sounds: No wheezing.  Abdominal:     General: Bowel sounds are normal.     Palpations: Abdomen is soft.  Skin:    General: Skin is warm and dry.     Capillary Refill: Capillary refill takes less than 2 seconds.  Neurological:     Mental Status: He is alert and oriented to person, place, and time.  Psychiatric:        Behavior: Behavior normal.        Thought Content: Thought content normal.        Judgment: Judgment normal.     Ortho Exam patient has negative logroll to the hips crepitus with knee range of motion 2+ knee effusion more medial than lateral joint line tenderness he lacks 5 degrees reaching full extension can flex 120 degrees with the left knee.  Distal pulses are palpable.  Opposite right knee shows left severe crepitus and medial joint line tenderness.  Lateral ligaments are stable ACL PCL exam  demonstrates intact cruciate ligaments right and left knee. Specialty Comments:  No specialty comments available.  Imaging: No results found.   PMFS History: Patient Active Problem List   Diagnosis Date Noted  . Bilateral leg edema 10/26/2019  . Erroneous encounter - disregard 10/09/2019  . Synovitis of left knee 08/27/2019  . Body mass index 40.0-44.9, adult ([place]) 08/27/2019  . Unilateral primary osteoarthritis, left knee 08/13/2019  . Esophageal varices without bleeding ([place]) 06/22/2019  . Cirrhosis, non-alcoholic ([place]) 76/54/6503  . Unilateral primary osteoarthritis, right knee 10/24/2017  . Rectal bleeding 06/06/2017  . 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 DM (diabetes mellitus) ([place]) 11/09/2013  . Unspecified diastolic heart failure   . Morbid obesity ([place])   . IBS (irritable bowel syndrome)   . Ejection fraction   . CAD, multiple  vessel   . Contrast media allergy   . Aneurysm ([place])   . Preop cardiovascular exam   . Prostate cancer ([place]) 03/24/2012  . Diastolic dysfunction 54/65/6812  . GERD (gastroesophageal reflux disease) 12/27/2010  . Hypertension 12/27/2010  . DYSLIPIDEMIA 12/26/2009  . FATTY LIVER DISEASE 12/26/2009   Past Medical History:  Diagnosis Date  . Aneurysm ([place])    right femoral pseudoaneurysm, thrombosed (following 03/20/07 cardiac cath)  . CAD, multiple vessel    PCI circumflex 2008, diffuse disease of the right and LAD treated medically.,  //   Nuclear 2010 no ischemia   //   nuclear May, 2012 no ischemia  . Cirrhosis of liver without mention of alcohol   . Contrast media allergy    pt states he is not allergy to contrast  . Diabetes mellitus without complication ([place])    type 2  . Diverticulosis   . Dysphagia   . Ejection fraction    EF 55%, nuclear, May, 2012  . Esophageal reflux   . IBS (irritable bowel syndrome)   . Lower abdominal pain   . Mixed dyslipidemia   . Morbid obesity ([place])   . Other chronic nonalcoholic liver disease   . Postsurgical percutaneous transluminal coronary angioplasty status   . Preop cardiovascular exam    Cardiovascular clearance for prostate surgery October, 2013  . Prostate cancer ([place]) 03/24/12  . Unspecified diastolic heart failure   . Unspecified essential hypertension     Family History  Problem Relation Age of Onset  . Heart disease Mother   . Breast cancer Mother   . Heart disease Father   . Breast cancer Sister   . Ovarian cancer Sister   . Heart disease Brother   . Healthy Daughter   . Healthy Daughter   . Healthy Daughter   . Healthy Daughter   . Coronary artery disease Unknown        family history    Past Surgical History:  Procedure Laterality Date  . BACK SURGERY     took cyst out of back  . CARDIAC CATHETERIZATION    . CARPAL TUNNEL RELEASE Bilateral   . CHOLECYSTECTOMY  06/1998  . COLONOSCOPY  03/03/03  . COLONOSCOPY   08/17/2011   Procedure: COLONOSCOPY;  Surgeon: Rogene Houston, MD;  Location: [facility];  Service: Endoscopy;  Laterality: N/A;  1045  . COLONOSCOPY N/A 07/05/2017   Procedure: COLONOSCOPY;  Surgeon: Rogene Houston, MD;  Location: [facility];  Service: Endoscopy;  Laterality: N/A;  2:00  . ESOPHAGOGASTRODUODENOSCOPY (EGD) WITH PROPOFOL N/A 12/20/2017   Procedure: ESOPHAGOGASTRODUODENOSCOPY (EGD) WITH PROPOFOL;  Surgeon: Rogene Houston, MD;  [facility];  Service: Endoscopy;  Laterality: N/A;  11:10 :  . LEFT HEART CATHETERIZATION WITH CORONARY ANGIOGRAM N/A 12/07/2013   Procedure: LEFT HEART CATHETERIZATION WITH CORONARY ANGIOGRAM;  Surgeon: Peter M Martinique, MD;  Location: [facility] CATH LAB;  Service: Cardiovascular;  Laterality: N/A;  . left rotator    . POLYPECTOMY  12/20/2017   Procedure: POLYPECTOMY;  Surgeon: Rogene Houston, MD;  Location: [facility];  Service: Endoscopy;;  Gastric (HS) x2  . RIGHT KNEE SURGERY    . ROBOT ASSISTED LAPAROSCOPIC RADICAL PROSTATECTOMY  06/26/2012   Procedure: ROBOTIC ASSISTED LAPAROSCOPIC RADICAL PROSTATECTOMY LEVEL 3;  Surgeon: Dutch Gray, MD;  Location: [facility];  Service: Urology;  Laterality: N/A;      . Two cardiac stents  2009  . UPPER GASTROINTESTINAL ENDOSCOPY  02/21/2011   EGD ED  . UPPER GASTROINTESTINAL ENDOSCOPY  03/03/03   TCS  . VASECTOMY     Social History   Occupational History  . Occupation: [employer]  Tobacco Use  . Smoking status: Former Smoker    Packs/day: 3.00    Years: 30.00    Pack years: 90.00    Types: Cigarettes    Quit date: 08/27/1998    Years since quitting: 21.6  . Smokeless tobacco: Never Used  . Tobacco comment: Pt quit smoking 10 yrs ago, smoked for about 30 yrs.  Substance and Sexual Activity  . Alcohol use: No    Alcohol/week: 0.0 standard drinks  . Drug use: No  . Sexual activity: Not on file

## 2020-05-09 ENCOUNTER — Telehealth: Payer: Self-pay | Admitting: *Deleted

## 2020-05-09 NOTE — Telephone Encounter (Signed)
° °  [facility] Pre-operative Risk Assessment    HEARTCARE STAFF: - Please ensure there is not already an duplicate clearance open for this procedure. - Under Visit Info/Reason for Call, type in Other and utilize the format Clearance MM/DD/YY or Clearance TBD. Do not use dashes or single digits. - If request is for dental extraction, please clarify the # of teeth to be extracted.  Request for surgical clearance:  1. What type of surgery is being performed?  LEFT TOTAL KNEE   2. When is this surgery scheduled?  TBD   3. What type of clearance is required (medical clearance vs. Pharmacy clearance to hold med vs. Both)?  BOTH  4. Are there any medications that need to be held prior to surgery and how long? ASPIRIN   5. Practice name and name of physician performing surgery?  [facility] / DR. Lorin Mercy   6. What is the office phone number?  [phone]   7.   What is the office fax number?  [phone]  8.   Anesthesia type (None, local, MAC, general) ?  SPINAL   Jeanann Lewandowsky 05/09/2020, 9:42 AM  _________________________________________________________________   (provider comments below)

## 2020-05-10 NOTE — Telephone Encounter (Signed)
   Primary Cardiologist:Branch, Roderic Palau, MD  Chart reviewed as part of pre-operative protocol coverage. Because of Rosco Harriott Wofford's past medical history and time since last visit, they will require a follow-up visit in order to better assess preoperative cardiovascular risk.  Pre-op covering staff: - Please schedule appointment and call patient to inform them. If patient already had an upcoming appointment within acceptable timeframe, please add "pre-op clearance" to the appointment notes so provider is aware. - Please contact requesting surgeon's office via preferred method (i.e, phone, fax) to inform them of need for appointment prior to surgery.  If applicable, this message will also be routed to pharmacy pool and/or primary cardiologist for input on holding anticoagulant/antiplatelet agent as requested below so that this information is available to the clearing provider at time of patient's appointment.   [place], [place]  05/10/2020, 3:20 PM

## 2020-05-10 NOTE — Telephone Encounter (Signed)
Spoke with patient and made him aware of need for an appointment prior to clearance. He became irate with me, stating that he was seen a few months ago and that clearance was supposed to be given from that visit. I informed him that was six months ago and from a cardiac standpoint that has been too long and that he would need to be seen again to be re-evaluated and cleared. He agreed to schedule an appointment but stated that this would be his last appointment with our office. He is scheduled to see Levell July, NP 05/19/2020 at [facility]. Will forward to requesting surgeons office to make them aware and remove from the pre op pool.

## 2020-05-17 ENCOUNTER — Ambulatory Visit (INDEPENDENT_AMBULATORY_CARE_PROVIDER_SITE_OTHER): Payer: Medicare Other | Admitting: Internal Medicine

## 2020-05-18 ENCOUNTER — Encounter: Payer: Self-pay | Admitting: *Deleted

## 2020-05-18 NOTE — Progress Notes (Signed)
Cardiology Office Note  Date: 05/19/2020   ID: Billy Davis, DOB 26-Sep-1955, MRN [id number]  PCP:  [name], MD  Cardiologist:  Carlyle Dolly, MD Electrophysiologist:  None   Chief Complaint: Preop surgical clearance  History of Present Illness: Billy Davis is a 64 y.o. male with a history of CAD, HLD, HTN, cirrhosis secondary to history of fatty liver disease, followed by GI.  History of prior BMS to LCx 2008 with noted moderate diffuse disease of LAD and RCA treated medically.Marland Kitchen  Lexiscan stress test 02/2013 multiple defects and normal wall motion thought most likely artifact, LVEF 50%.  Cardiac catheterization 12/07/2013: LM patent, LAD 30% proximal, LCx patent, OM 2 with patent stent, RCA 30% proximal disease, EF 55 to 65% LV gram.  Presentation to [facility] 04/25/2019 with chest pain occurring at rest over the previous week.  Stress test on 04/2019 moderate reversibility inferior apical inferior wall.  Antianginals titrated and symptoms resolved.  Did not want to pursue cardiac catheterization  Last seen by Dr. Harl Bowie 11/24/2019: No recent chest pain, shortness of breath or DOE.  Walking 300 yards regularly without trouble compliant with meds except for only taking Lopressor once daily.  Had started atorvastatin for hyperlipidemia for 1 month but stopped due to reluctance to take statin.  He was compliant with his antihypertensive medications.  Previous notes state preoperative evaluation considering knee replacement, tolerates greater than 4 METS without significant symptoms.  Lopressor was increased to 50 mg p.o. twice daily.  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  He is to have knee replacement surgery by Dr. Rodell Perna.  He is here for preop clearance.  He denies any current or recent anginal or exertional symptoms, palpitations or arrhythmias, orthostatic symptoms, CVA or TIA-like symptoms, PND, orthopnea,  bleeding issues, claudication-like symptoms, DVT or PE-like symptoms.  He has some chronic lower extremity edema which very mild currently.  He is revised cardiac risk index score today is 2 equaling a 6.6% perioperative risk of major cardiac events.  His Duke activity status index equals 32.95 giving him a functional capacity and METS 6.79.  Currently takes Lantus 25 units daily for diabetes.  States his blood sugar this morning was in the 150 range    Past Medical History:  Diagnosis Date  . 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 CAD, multiple vessel    PCI circumflex 2008, diffuse disease of the right and LAD treated medically.,  //   Nuclear 2010 no ischemia   //   nuclear May, 2012 no ischemia  . Cirrhosis of liver without mention of alcohol   . Contrast media allergy    pt states he is not allergy to contrast  . Diabetes mellitus without complication ([place])    type 2  . Diverticulosis   . Dysphagia   . Ejection fraction    EF 55%, nuclear, May, 2012  . Esophageal reflux   . IBS (irritable bowel syndrome)   . Lower abdominal pain   . Mixed dyslipidemia   . Morbid obesity ([place])   . Other chronic nonalcoholic liver disease   . Postsurgical percutaneous transluminal coronary angioplasty status   . Preop cardiovascular exam    Cardiovascular clearance for prostate surgery October, 2013  . Prostate cancer ([place]) 03/24/12  . Unspecified diastolic heart failure   . Unspecified essential hypertension     Past Surgical  History:  Procedure Laterality Date  . BACK SURGERY     took cyst out of back  . CARDIAC CATHETERIZATION    . CARPAL TUNNEL RELEASE Bilateral   . CHOLECYSTECTOMY  06/1998  . COLONOSCOPY  03/03/03  . COLONOSCOPY  08/17/2011   Procedure: COLONOSCOPY;  Surgeon: Rogene Houston, MD;  Location: [facility];  Service: Endoscopy;  Laterality: N/A;  1045  . COLONOSCOPY N/A 07/05/2017   Procedure: COLONOSCOPY;   Surgeon: Rogene Houston, MD;  Location: [facility];  Service: Endoscopy;  Laterality: N/A;  2:00  . ESOPHAGOGASTRODUODENOSCOPY (EGD) WITH PROPOFOL N/A 12/20/2017   Procedure: ESOPHAGOGASTRODUODENOSCOPY (EGD) WITH PROPOFOL;  Surgeon: Rogene Houston, MD;  Location: [facility];  Service: Endoscopy;  Laterality: N/A;  11:10 :  . LEFT HEART CATHETERIZATION WITH CORONARY ANGIOGRAM N/A 12/07/2013   Procedure: LEFT HEART CATHETERIZATION WITH CORONARY ANGIOGRAM;  Surgeon: Peter M Martinique, MD;  Location: [facility] CATH LAB;  Service: Cardiovascular;  Laterality: N/A;  . left rotator    . POLYPECTOMY  12/20/2017   Procedure: POLYPECTOMY;  Surgeon: Rogene Houston, MD;  Location: [facility];  Service: Endoscopy;;  Gastric (HS) x2  . RIGHT KNEE SURGERY    . ROBOT ASSISTED LAPAROSCOPIC RADICAL PROSTATECTOMY  06/26/2012   Procedure: ROBOTIC ASSISTED LAPAROSCOPIC RADICAL PROSTATECTOMY LEVEL 3;  Surgeon: Dutch Gray, MD;  Location: [facility];  Service: Urology;  Laterality: N/A;      . Two cardiac stents  2009  . UPPER GASTROINTESTINAL ENDOSCOPY  02/21/2011   EGD ED  . UPPER GASTROINTESTINAL ENDOSCOPY  03/03/03   TCS  . VASECTOMY      Current Outpatient Medications  Medication Sig Dispense Refill  . amLODipine (NORVASC) 10 MG tablet Take 10 mg by mouth daily.     Marland Kitchen aspirin EC 81 MG tablet Take 1 tablet (81 mg total) by mouth daily.    Marland Kitchen atorvastatin (LIPITOR) 10 MG tablet Take 10 mg by mouth daily.    . famotidine (PEPCID) 20 MG tablet Take 1 tablet (20 mg total) by mouth at bedtime.    . gabapentin (NEURONTIN) 100 MG capsule Take 100 mg by mouth at bedtime.    Marland Kitchen ibuprofen (ADVIL,MOTRIN) 200 MG tablet Take 400 mg by mouth every 8 (eight) hours as needed for mild pain or moderate pain.    Marland Kitchen insulin glargine (LANTUS) 100 UNIT/ML injection Inject 25 Units into the skin daily.    Marland Kitchen lisinopril (PRINIVIL,ZESTRIL) 40 MG tablet Take 40 mg by mouth every morning.     . metoprolol tartrate (LOPRESSOR) 50 MG  tablet Take 1 tablet (50 mg total) by mouth 2 (two) times daily. 180 tablet 1  . Multiple Vitamin (MULTI VITAMIN MENS PO) Take 1 tablet by mouth daily.     Marland Kitchen NITROSTAT 0.4 MG SL tablet Place 1 tablet under the tongue every 5 (five) minutes as needed for chest pain. For chest pains    . pantoprazole (PROTONIX) 40 MG tablet Take 40 mg by mouth 2 (two) times daily.     Marland Kitchen spironolactone (ALDACTONE) 25 MG tablet Take 25 mg by mouth 2 (two) times daily.      No current facility-administered medications for this visit.   Allergies:  Codeine and Plavix [clopidogrel bisulfate]   Social History: The patient  reports that he quit smoking about 21 years ago. His smoking use included cigarettes. He has a 90.00 pack-year smoking history. He has never used smokeless tobacco. He reports that he does not drink  alcohol and does not use drugs.   Family History: The patient's family history includes Breast cancer in his mother and sister; Coronary artery disease in an other family member; Healthy in his daughter, daughter, daughter, and daughter; Heart disease in his brother, father, and mother; Ovarian cancer in his sister.   ROS:  Please see the history of present illness. Otherwise, complete review of systems is positive for none.  All other systems are reviewed and negative.   Physical Exam: VS:  BP (!) 142/86   Pulse 61   Ht 6' (1.829 m)   Wt 296 lb (134.3 kg)   SpO2 95%   BMI 40.14 kg/m , BMI Body mass index is 40.14 kg/m.  Wt Readings from Last 3 Encounters:  05/19/20 296 lb (134.3 kg)  04/28/20 286 lb 9.6 oz (130 kg)  03/03/20 287 lb (130.2 kg)    General: Obese patient appears comfortable at rest. Neck: Supple, no elevated JVP or carotid bruits, no thyromegaly. Lungs: Clear to auscultation, nonlabored breathing at rest. Cardiac: Regular rate and rhythm, no S3 or significant systolic murmur, no pericardial rub. Extremities: Mild non-pitting edema, distal pulses 2+. Skin: Warm and  dry. Musculoskeletal: No kyphosis. Neuropsychiatric: Alert and oriented x3, affect grossly appropriate.  ECG:  An ECG dated 05/19/2020 was personally reviewed today and demonstrated:  Sinus rhythm with premature atrial complexes rate of 62.  Recent Labwork: 10/26/2019: ALT 37; AST 33; BUN 18; Creat 0.89; Hemoglobin 14.6; Platelets 165; Potassium 4.1; Sodium 140  No results found for: CHOL, TRIG, HDL, CHOLHDL, VLDL, LDLCALC, LDLDIRECT  Other Studies Reviewed Today: Diagnostic Studies 02/2007 Cath FINDINGS: Aortic pressure 131/85 with a mean of 106, left ventricular  pressure is 128/16.  The left mainstem has luminal irregularities but no significant  angiographic stenosis.  The left mainstem bifurcates into the LAD and left circumflex. The LAD  is a large-caliber vessel that courses down and reaches the LV apex.  Proximal portion of the LAD in the area of the first septal perforator  has a diffuse 50% stenosis. There is a diagonal branch that arises from  this area. The diagonal is widely patent. The remaining portions of  the mid and distal LAD have diffuse nonobstructive plaque without  significant focal stenosis.  The left circumflex is a large-caliber caliber vessel. It gives off a  large first OM branch that has an 80% stenosis in its proximal portion.  The AV groove circumflex continues down and provides a small left  posterolateral branch. There is no significant angiographic disease in  the AV groove circumflex.  The right coronary artery is diffusely diseased. There are luminal  irregularities in the proximal portion. The midportion in the stented  segment that is ectatic. Distally, there is diffuse disease with 50%  stenosis just before the bifurcation of the PDA and posterior AV  segment. The PDA and posterolateral branches have no significant  angiographic disease  Left ventriculography demonstrates normal LV systolic function with an  LVEF of 60%. There is no mitral  regurgitation.  ASSESSMENT:  1. Severe obtuse marginal 1 stenosis.  2. Moderate diffuse disease in the left anterior descending artery and  right coronary artery.  3. Normal left ventricular systolic function.  4. Successful percutaneous coronary intervention of the left  circumflex with a bare-metal stent.  Will continue aspirin indefinitely. The patient should be on  ticlopidine for 30 days with close monitoring of his CBC.   02/2013 Lexiscan MPI Small partially reversible apical to mid anteroseptal  defect. Partially reversible mid anterior defect. Fixed mid to basal inferior wall defect. Normal wall motion, thought to be artifact/noted soft tissue attenuation   11/17/13 clinic EKG NSR,    11/2013 Cath Procedural Findings:  Hemodynamics:  AO 110/62 mean 79 mm Hg  LV 113/12 mm Hg  Coronary angiography:  Coronary dominance: right  Left mainstem: Normal  Left anterior descending (LAD): 30% disease in the proximal vessel. The first diagonal is normal.  Left circumflex (LCx): The LCx gives rise to a large OM 1, a small OM2, and terminates in a posterolateral branch. The stent in the OM 1 is widely patent. No other significant disease in the LCx.  Right coronary artery (RCA): Large vessel. There is 30% disease in the proximal vessel, at the crux, and in the distal RCA.  Left ventriculography: Left ventricular systolic function is normal, LVEF is estimated at 55-65%, there is no significant mitral regurgitation  Final Conclusions:  1. Nonobstructive CAD. The prior stent in OM1 is widely patent.  2. Normal LV function.  Recommendations: Continue medical therapy.   03/2019 echo [facility] Summary  1. Echo contrast utilized to enhance endocardial border definition.  2. The left ventricle is normal in size with mildly increased wall thickness.  3. Normal left ventricular systolic function, ejection fraction 60-65%.  4. Normal right ventricular size and systolic function.  5.  The aortic valve is trileaflet with mildly thickened leaflets with normal excursion.  6. Aortic root borderline dilated.  7. Ascending aorta borderline dilated.  Assessment and Plan:  1. Preoperative evaluation to rule out surgical contraindication   2. CAD in native artery   3. Mixed hyperlipidemia   4. Essential hypertension    1. Preoperative evaluation to rule out surgical contraindication Patient has a pending left knee replacement with Dr. Rodell Perna.  His Duke activity score index equals 2 putting him at a perioperative risk of major cardiac events at 6.6%.  His Duke activity score index is 32.95 giving him a functional capacity and METS at 6.79%.  Patient would be considered a low  risk for left total knee replacement under general anesthesia.  Patient states she is having regional anesthesia instead of general.  2. CAD in native artery BMS to the left circumflex 2008 with noted moderate diffuse disease of LAD and RCA treated medically.  Cardiac catheterization 2015, left main patent, LAD 30% proximal, left circumflex patent, OM 2 with patent stent, RCA 30% proximal disease, EF 55 to 65% by LV gram.  Stress test 05/17/2019 moderate reversibility inferior apical inferior wall.  Antianginals titrated and symptoms solved.  Did not want to pursue cardiac catheterization.  Denies any current anginal or exertional symptoms.  Continue aspirin 81 mg, nitroglycerin sublingual 0.4 mg as needed,  3. Mixed hyperlipidemia Continue atorvastatin 10 mg daily.  4. Essential hypertension Blood pressure 142/86 today.  Patient states sometimes he misses a medication dose.  Continue amlodipine 10 mg daily, lisinopril 40 mg daily.,  Metoprolol 50 mg p.o. twice daily.  Spironolactone 25 mg p.o. daily.   Medication Adjustments/Labs and Tests Ordered: Current medicines are reviewed at length with the patient today.  Concerns regarding medicines are outlined above.   Disposition: Follow-up with Dr. Harl Bowie  or APP 6 months  Signed, Levell July, NP 05/19/2020 8:38 AM    [facility] [address], [place], [place] [address] Phone: [phone]; Fax: [phone]

## 2020-05-19 ENCOUNTER — Encounter: Payer: Self-pay | Admitting: Family Medicine

## 2020-05-19 ENCOUNTER — Ambulatory Visit (INDEPENDENT_AMBULATORY_CARE_PROVIDER_SITE_OTHER): Payer: Medicare Other | Admitting: Family Medicine

## 2020-05-19 VITALS — BP 142/86 | HR 61 | Ht 72.0 in | Wt 296.0 lb

## 2020-05-19 DIAGNOSIS — I10 Essential (primary) hypertension: Secondary | ICD-10-CM

## 2020-05-19 DIAGNOSIS — Z01818 Encounter for other preprocedural examination: Secondary | ICD-10-CM

## 2020-05-19 DIAGNOSIS — E782 Mixed hyperlipidemia: Secondary | ICD-10-CM

## 2020-05-19 DIAGNOSIS — I2510 Atherosclerotic heart disease of native coronary artery without angina pectoris: Secondary | ICD-10-CM | POA: Diagnosis not present

## 2020-05-19 NOTE — Patient Instructions (Signed)
Medication Instructions:  Continue all current medications.   Labwork: none  Testing/Procedures: none  Follow-Up: 6 months   Any Other Special Instructions Will Be Listed Below (If Applicable).   If you need a refill on your cardiac medications before your next appointment, please call your pharmacy.  

## 2020-05-19 NOTE — Progress Notes (Signed)
Clearance from cardiology

## 2020-11-17 ENCOUNTER — Encounter (INDEPENDENT_AMBULATORY_CARE_PROVIDER_SITE_OTHER): Payer: Self-pay | Admitting: *Deleted

## 2020-11-17 ENCOUNTER — Encounter (INDEPENDENT_AMBULATORY_CARE_PROVIDER_SITE_OTHER): Payer: Self-pay | Admitting: Gastroenterology

## 2020-11-17 ENCOUNTER — Ambulatory Visit (INDEPENDENT_AMBULATORY_CARE_PROVIDER_SITE_OTHER): Payer: Medicare Other | Admitting: Gastroenterology

## 2020-11-17 ENCOUNTER — Other Ambulatory Visit: Payer: Self-pay

## 2020-11-17 ENCOUNTER — Other Ambulatory Visit (INDEPENDENT_AMBULATORY_CARE_PROVIDER_SITE_OTHER): Payer: Self-pay | Admitting: Gastroenterology

## 2020-11-17 VITALS — BP 149/80 | HR 69 | Temp 98.2°F | Ht 74.0 in | Wt 290.0 lb

## 2020-11-17 DIAGNOSIS — K580 Irritable bowel syndrome with diarrhea: Secondary | ICD-10-CM

## 2020-11-17 DIAGNOSIS — R197 Diarrhea, unspecified: Secondary | ICD-10-CM

## 2020-11-17 DIAGNOSIS — K7460 Unspecified cirrhosis of liver: Secondary | ICD-10-CM

## 2020-11-17 DIAGNOSIS — R1013 Epigastric pain: Secondary | ICD-10-CM | POA: Diagnosis not present

## 2020-11-17 DIAGNOSIS — R0602 Shortness of breath: Secondary | ICD-10-CM

## 2020-11-17 DIAGNOSIS — I8500 Esophageal varices without bleeding: Secondary | ICD-10-CM | POA: Diagnosis not present

## 2020-11-17 DIAGNOSIS — G8929 Other chronic pain: Secondary | ICD-10-CM

## 2020-11-17 MED ORDER — DICYCLOMINE HCL 10 MG PO CAPS: 10.0000 mg | ORAL_CAPSULE | Freq: Two times a day (BID) | ORAL | 2 refills | Status: DC | PRN

## 2020-11-17 NOTE — Progress Notes (Signed)
Billy Davis, M.D. Gastroenterology & Hepatology [facility] [address] [place], [place] [address]  Primary Care Physician: Billy Davis, [place] [id number]  I will communicate my assessment and recommendations to the referring MD via EMR.  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 IBS  History of Present Illness: Billy Davis is a 65 y.o. male with past medical history of coronary artery disease status post PCI with stent placement, diabetes, NASH cirrhosis, IBS, prostate cancer, hyperlipidemia, GERD, hypertension, who presents for evaluation of episodes of abdominal pain.  The patient was last seen on 10/26/2019. At that time, the patient was considered to have well compensated liver cirrhosis.  He was ordered to have meld labs and AFP testing, also ordered to have liver ultrasound.  Patient was counseled to take pantoprazole 40 mg once a day and take Pepcid as needed.  Most recent labs from 10/26/2019 showed normal electrolytes and renal function, albumin was 4.5, alkaline phosphatase was 89, ALT 37 and AST 33, total bilirubin 0.9, sodium 140, CBC showed normal platelet count 165 although cell count, INR was 1.1.  AFP was 2.5.  Patient comes today complaining of some RUQ pain and pain over the right side of his ribs. Has not taken any medication for this. He reports that he also has presented some pain and discomfort in the epigastric area describesd as a "hollow sensation in the upper abdomen".  He states that he did not have the symptoms in the past. He has 4-5 loose daily bowel movements at the moment, which started for the last 4 weeks. States he was having 3 BMs in the past and does not know why his bowel movements increased in amount.   He is not presenting any change in his dietary habits.The patient denies having any nausea, vomiting, fever, chills, hematochezia, melena, hematemesis, jaundice,  pruritus.He reports his legs are getting progressively more swollen with time for which he is taking a diuretic at the moment.  He had Covid 19 in September and lost significant amount of weight.  Notably, he reports that for the last 2-3 weeks he has been having shortness of breath when exerting . Denies havign any chest pain but states that shortness of breath has limited his daily activities.  Last EGD: 2019 - 12/20/2017, grade 1 esophageal varices were found in the distal esophagus, mild portal hypertensive gastropathy, there was presence of 2 semisessile polyps between 6 to 12 mm in the antrum which were removed with hot snare, hemostatic clip was placed.  There was presence of a 10 mm polyp in the prepyloric area which was also removed with hot snare.  Pathology was consistent with hyperplastic polyps and inflamed granulation tissue. Last Colonoscopy: 2018 -diverticulosis and hemorrhoids  Past Medical History: Past Medical History:  Diagnosis Date  . 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 CAD, multiple vessel    PCI circumflex 2008, diffuse disease of the right and LAD treated medically.,  //   Nuclear 2010 no ischemia   //   nuclear May, 2012 no ischemia  . Cirrhosis of liver without mention of alcohol   . Contrast media allergy    pt states he is not allergy to contrast  . Diabetes mellitus without complication ([place])    type 2  . Diverticulosis   . Dysphagia   . Ejection fraction    EF 55%, nuclear, May,  2012  . Esophageal reflux   . IBS (irritable bowel syndrome)   . Lower abdominal pain   . Mixed dyslipidemia   . Morbid obesity ([place])   . Other chronic nonalcoholic liver disease   . Postsurgical percutaneous transluminal coronary angioplasty status   . Preop cardiovascular exam    Cardiovascular clearance for prostate surgery October, 2013  . Prostate cancer ([place]) 03/24/12  . Unspecified diastolic heart failure   .  Unspecified essential hypertension     Past Surgical History: Past Surgical History:  Procedure Laterality Date  . BACK SURGERY     took cyst out of back  . CARDIAC CATHETERIZATION    . CARPAL TUNNEL RELEASE Bilateral   . CHOLECYSTECTOMY  06/1998  . COLONOSCOPY  03/03/03  . COLONOSCOPY  08/17/2011   Procedure: COLONOSCOPY;  Surgeon: Rogene Houston, MD;  Location: [facility];  Service: Endoscopy;  Laterality: N/A;  1045  . COLONOSCOPY N/A 07/05/2017   Procedure: COLONOSCOPY;  Surgeon: Rogene Houston, MD;  Location: [facility];  Service: Endoscopy;  Laterality: N/A;  2:00  . ESOPHAGOGASTRODUODENOSCOPY (EGD) WITH PROPOFOL N/A 12/20/2017   Procedure: ESOPHAGOGASTRODUODENOSCOPY (EGD) WITH PROPOFOL;  Surgeon: Rogene Houston, MD;  [facility]: [facility];  Service: Endoscopy;  Laterality: N/A;  11:10 :  . LEFT HEART CATHETERIZATION WITH CORONARY ANGIOGRAM N/A 12/07/2013   Procedure: LEFT HEART CATHETERIZATION WITH CORONARY ANGIOGRAM;  Surgeon: Peter M Martinique, MD;  Location: [facility] CATH LAB;  Service: Cardiovascular;  Laterality: N/A;  . left rotator    . POLYPECTOMY  12/20/2017   Procedure: POLYPECTOMY;  Surgeon: Rogene Houston, MD;  Location: [facility];  Service: Endoscopy;;  Gastric (HS) x2  . RIGHT KNEE SURGERY    . ROBOT ASSISTED LAPAROSCOPIC RADICAL PROSTATECTOMY  06/26/2012   Procedure: ROBOTIC ASSISTED LAPAROSCOPIC RADICAL PROSTATECTOMY LEVEL 3;  Surgeon: Dutch Gray, MD;  Location: [facility];  Service: Urology;  Laterality: N/A;      . Two cardiac stents  2009  . UPPER GASTROINTESTINAL ENDOSCOPY  02/21/2011   EGD ED  . UPPER GASTROINTESTINAL ENDOSCOPY  03/03/03   TCS  . VASECTOMY      Family History: Family History  Problem Relation Age of Onset  . Heart disease Mother   . Breast cancer Mother   . Heart disease Father   . Breast cancer Sister   . Ovarian cancer Sister   . Heart disease Brother   . Healthy Daughter   . Healthy Daughter   . Healthy Daughter   .  Healthy Daughter   . Coronary artery disease Other        family history    Social History: Social History   Tobacco Use  Smoking Status Former Smoker  . Packs/day: 3.00  . Years: 30.00  . Pack years: 90.00  . Types: Cigarettes  . Quit date: 08/27/1998  . Years since quitting: 22.2  Smokeless Tobacco Never Used  Tobacco Comment   Pt quit smoking 10 yrs ago, smoked for about 30 yrs.   Social History   Substance and Sexual Activity  Alcohol Use No  . Alcohol/week: 0.0 standard drinks   Social History   Substance and Sexual Activity  Drug Use No    Allergies: Allergies  Allergen Reactions  . Codeine Palpitations  . Plavix [Clopidogrel Bisulfate] Rash    Medications: Current Outpatient Medications  Medication Sig Dispense Refill  . amLODipine (NORVASC) 10 MG tablet Take 10 mg by mouth daily.     Marland Kitchen  aspirin EC 81 MG tablet Take 1 tablet (81 mg total) by mouth daily.    . famotidine (PEPCID) 20 MG tablet Take 1 tablet (20 mg total) by mouth at bedtime.    Marland Kitchen ibuprofen (ADVIL,MOTRIN) 200 MG tablet Take 400 mg by mouth every 8 (eight) hours as needed for mild pain or moderate pain.    Marland Kitchen insulin glargine (LANTUS) 100 UNIT/ML injection Inject 40 Units into the skin at bedtime.    Marland Kitchen lisinopril (PRINIVIL,ZESTRIL) 40 MG tablet Take 40 mg by mouth every morning.     . metoprolol tartrate (LOPRESSOR) 50 MG tablet Take 1 tablet (50 mg total) by mouth 2 (two) times daily. 180 tablet 1  . Multiple Vitamin (MULTI VITAMIN MENS PO) Take 1 tablet by mouth daily.     Marland Kitchen NITROSTAT 0.4 MG SL tablet Place 1 tablet under the tongue every 5 (five) minutes as needed for chest pain. For chest pains    . pantoprazole (PROTONIX) 40 MG tablet Take 40 mg by mouth 2 (two) times daily.     Marland Kitchen spironolactone (ALDACTONE) 25 MG tablet Take 25 mg by mouth 2 (two) times daily.     Marland Kitchen zinc gluconate 50 MG tablet Take 50 mg by mouth daily.     No current facility-administered medications for this visit.     Review of Systems: GENERAL: negative for malaise, night sweats HEENT: No changes in hearing or vision, no nose bleeds or other nasal problems. NECK: Negative for lumps, goiter, pain and significant neck swelling RESPIRATORY: Negative for cough, wheezing CARDIOVASCULAR: Negative for chest pain, leg swelling, palpitations, orthopnea GI: SEE HPI MUSCULOSKELETAL: Negative for joint pain or swelling, back pain, and muscle pain. SKIN: Negative for lesions, rash PSYCH: Negative for sleep disturbance, mood disorder and recent psychosocial stressors. HEMATOLOGY Negative for prolonged bleeding, bruising easily, and swollen nodes. ENDOCRINE: Negative for cold or heat intolerance, polyuria, polydipsia and goiter. NEURO: negative for tremor, gait imbalance, syncope and seizures. The remainder of the review of systems is noncontributory.   Physical Exam: BP (!) 149/80 (BP Location: Left Arm, Patient Position: Sitting, Cuff Size: Large)   Pulse 69   Temp 98.2 F (36.8 C) (Oral)   Ht 6' 2"  (1.88 m)   Wt 290 lb (131.5 kg)   BMI 37.23 kg/m  GENERAL: The patient is AO x3, in no acute distress. Obese. HEENT: Head is normocephalic and atraumatic. EOMI are intact. Mouth is well hydrated and without lesions. NECK: Supple. No masses LUNGS: Clear to auscultation. No presence of rhonchi/wheezing/rales. Adequate chest expansion HEART: RRR, normal s1 and s2. ABDOMEN: tender to palpation in epigastric area and RUQ, no guarding, no peritoneal signs, and nondistended. BS +. No masses. EXTREMITIES: Without any cyanosis, clubbing, rash, lesions. Has +1 pitting edema in both legs. NEUROLOGIC: AOx3, no focal motor deficit. SKIN: no jaundice, no rashes  Imaging/Labs: as above  I personally reviewed and interpreted the available labs, imaging and endoscopic files.  Impression and Plan: Billy Davis is a 65 y.o. male with past medical history of coronary artery disease status post PCI with stent  placement, diabetes, NASH cirrhosis, IBS, prostate cancer, hyperlipidemia, GERD, hypertension, who presents for evaluation of episodes of abdominal pain.  Has presented compensated liver cirrhosis although he had presence of grade 1 esophageal varices during screening esophagogastroduodenospy.  We will need to check repeat meld labs today as well as AFP as part of his surveillance labs.  It is not clear to me why he has the abdominal  pain, which could be related to his irritable bowel syndrome, however, we will explore this further with an abdominal ultrasound, which will also help [place] evaluate the presence of any liver masses.  As he has presented new onset diarrhea, will rule out infectious etiology with a GI pathogen panel and C. difficile testing.  He would benefit from the use of antispasmodic such as Bentyl to relieve his symptoms of abdominal pain.  Even though he is due for a repeat EGD for surveillance of his esophageal varices, I will hold on ordering this procedure for now until he is evaluated by his cardiologist as he has presented new onset shortness of breath which increases the concern for an anginal equivalent.  The patient understood and agreed.  - Follow up with cardiologist regarding shortness of breath ASAP - MELD labs and AFP - Check C. Diff, GI path in stool - Schedule abdominal US - Start Levsin 1 tablet q8h as needed for abdominal pain episodes - Reduce salt intake to <2 g per day - Can take Tylenol max of 2 g per day (650 mg q8h) for pain - Avoid NSAIDs for pain - Avoid eating raw oysters or shellfish - Ensure every night before going to sleep - RTC 6 months  All questions were answered.      Harvel Quale, MD Gastroenterology and Hepatology [facility]

## 2020-11-17 NOTE — Patient Instructions (Addendum)
Follow up with your cardiologist regarding shortness of breath - reach them ASAP Perform blood workup Perform stool workup Schedule abdominal US Start Levsin 1 tablet q8h as needed for abdominal pain episodes Reduce salt intake to <2 g per day Can take Tylenol max of 2 g per day (650 mg q8h) for pain Avoid NSAIDs for pain Avoid eating raw oysters or shellfish Ensure every night before going to sleep

## 2020-11-18 LAB — CBC WITH DIFFERENTIAL/PLATELET
Absolute Monocytes: 352 cells/uL (ref 200–950)
Basophils Absolute: 61 cells/uL (ref 0–200)
Basophils Relative: 1.1 %
Eosinophils Absolute: 132 cells/uL (ref 15–500)
Eosinophils Relative: 2.4 %
HCT: 42.2 % (ref 38.5–50.0)
Hemoglobin: 13.8 g/dL (ref 13.2–17.1)
Lymphs Abs: 1210 cells/uL (ref 850–3900)
MCH: 27.8 pg (ref 27.0–33.0)
MCHC: 32.7 g/dL (ref 32.0–36.0)
MCV: 84.9 fL (ref 80.0–100.0)
MPV: 11.6 fL (ref 7.5–12.5)
Monocytes Relative: 6.4 %
Neutro Abs: 3746 cells/uL (ref 1500–7800)
Neutrophils Relative %: 68.1 %
Platelets: 140 10*3/uL (ref 140–400)
RBC: 4.97 10*6/uL (ref 4.20–5.80)
RDW: 13.4 % (ref 11.0–15.0)
Total Lymphocyte: 22 %
WBC: 5.5 10*3/uL (ref 3.8–10.8)

## 2020-11-18 LAB — COMPREHENSIVE METABOLIC PANEL
AG Ratio: 1.7 (calc) (ref 1.0–2.5)
ALT: 27 U/L (ref 9–46)
AST: 20 U/L (ref 10–35)
Albumin: 4.3 g/dL (ref 3.6–5.1)
Alkaline phosphatase (APISO): 63 U/L (ref 35–144)
BUN: 16 mg/dL (ref 7–25)
CO2: 24 mmol/L (ref 20–32)
Calcium: 9.1 mg/dL (ref 8.6–10.3)
Chloride: 105 mmol/L (ref 98–110)
Creat: 0.82 mg/dL (ref 0.70–1.25)
Globulin: 2.5 g/dL (calc) (ref 1.9–3.7)
Glucose, Bld: 222 mg/dL — ABNORMAL HIGH (ref 65–139)
Potassium: 4.2 mmol/L (ref 3.5–5.3)
Sodium: 138 mmol/L (ref 135–146)
Total Bilirubin: 0.8 mg/dL (ref 0.2–1.2)
Total Protein: 6.8 g/dL (ref 6.1–8.1)

## 2020-11-18 LAB — PROTIME-INR
INR: 1.1
Prothrombin Time: 11 s (ref 9.0–11.5)

## 2020-11-18 LAB — AFP TUMOR MARKER: AFP-Tumor Marker: 1.9 ng/mL (ref <6.1)

## 2020-11-25 ENCOUNTER — Ambulatory Visit (HOSPITAL_COMMUNITY)
Admission: RE | Admit: 2020-11-25 | Discharge: 2020-11-25 | Disposition: A | Discharge status: Home / Self Care | Payer: Medicare Other | Source: Ambulatory Visit | Attending: Gastroenterology | Admitting: Gastroenterology

## 2020-11-25 DIAGNOSIS — R1013 Epigastric pain: Secondary | ICD-10-CM | POA: Insufficient documentation

## 2020-11-25 DIAGNOSIS — G8929 Other chronic pain: Secondary | ICD-10-CM | POA: Insufficient documentation

## 2020-11-25 DIAGNOSIS — K7460 Unspecified cirrhosis of liver: Secondary | ICD-10-CM | POA: Diagnosis present

## 2020-11-30 NOTE — Progress Notes (Signed)
Cardiology Office Note  Date: 12/01/2020   ID: Billy Davis, DOB 01/03/1956, MRN [id number]  PCP:  [name], MD  Cardiologist:  Carlyle Dolly, MD Electrophysiologist:  None   Chief Complaint: Preop surgical clearance colonoscopy   History of Present Illness: Billy Davis is a 65 y.o. male with a history of CAD, HLD, HTN, cirrhosis secondary to history of fatty liver disease, followed by GI.  History of prior BMS to LCx 2008 with noted moderate diffuse disease of LAD and RCA treated medically.Marland Kitchen  Lexiscan stress test 02/2013 multiple defects and normal wall motion thought most likely artifact, LVEF 50%.  Cardiac catheterization 12/07/2013: LM patent, LAD 30% proximal, LCx patent, OM 2 with patent stent, RCA 30% proximal disease, EF 55 to 65% LV gram.  Presentation to [facility] 04/25/2019 with chest pain occurring at rest over the previous week.  Stress test on 04/2019 moderate reversibility inferior apical inferior wall.  Antianginals titrated and symptoms resolved.  Did not want to pursue cardiac catheterization  Last seen by Dr. Harl Bowie 11/24/2019: No recent chest pain, shortness of breath or DOE.  Walking 300 yards regularly without trouble compliant with meds except for only taking Lopressor once daily.  Had started atorvastatin for hyperlipidemia for 1 month but stopped due to reluctance to take statin.  He was compliant with his antihypertensive medications.  Previous notes state preoperative evaluation considering knee replacement, tolerates greater than 4 METS without significant symptoms.  Lopressor was increased to 50 mg p.o. twice daily.  At last visit he was pending left knee surgery.  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  He was to have knee replacement surgery by Dr. Rodell Perna. Here for preop clearance.  He denied any current or recent anginal or exertional symptoms, palpitations or arrhythmias, orthostatic symptoms, CVA or TIA-like symptoms, PND,  orthopnea, bleeding issues, claudication-like symptoms, DVT or PE-like symptoms.  He has some chronic lower extremity edema which very mild currently.  There is revised cardiac risk index score was 2 placing him at a 6.6% perioperative risk of major cardiac events.  His Duke activity status index equals 32.95 giving him a functional capacity and METS 6.79.  Currently taking Lantus 25 units daily for diabetes.  His blood sugar that morning was morning was in the 150 range.  Here for 6 month follow up. Recently seen by Dr Jenetta Downer GI and endorsed some SOB for the prior 3 weeks. Last visit he was here for surgical clearance for pending left knee surgery with Dr Lorin Mercy orthopedics. Dr Jenetta Downer, Gastroenterology mentioned in recent note to me patient would need clearance for esophageal varices surveillance endoscopy and evaluation of abdominal pain.  He states before he could get his left knee surgery he came down with the Covid virus therefore his pending knee surgery was postponed.  He denies any recent issues other than some increased shortness of breath when he is outside feeding his chickens are walking a fair amount of distance.  He has a 5 area acre where he walks and feeds his chickens daily.  Noticing some increasing dyspnea on exertion.  He denies any anginal symptoms, orthostatic symptoms, palpitations, stroke or TIA symptoms.  Denies any bleeding issues, PND, orthopnea.  Denies any claudication-like symptoms, DVT or PE-like symptoms.     Past Medical History:  Diagnosis Date  . Aneurysm ([place])    right femoral pseudoaneurysm, thrombosed (following 03/20/07 cardiac cath)  . CAD, multiple vessel    PCI circumflex 2008,  diffuse disease of the right and LAD treated medically.,  //   Nuclear 2010 no ischemia   //   nuclear May, 2012 no ischemia  . Cirrhosis of liver without mention of alcohol   . Contrast media allergy    pt states he is not allergy to contrast  . Diabetes mellitus without  complication ([place])    type 2  . Diverticulosis   . Dysphagia   . Ejection fraction    EF 55%, nuclear, May, 2012  . Esophageal reflux   . IBS (irritable bowel syndrome)   . Lower abdominal pain   . Mixed dyslipidemia   . Morbid obesity ([place])   . Other chronic nonalcoholic liver disease   . Postsurgical percutaneous transluminal coronary angioplasty status   . Preop cardiovascular exam    Cardiovascular clearance for prostate surgery October, 2013  . Prostate cancer ([place]) 03/24/12  . Unspecified diastolic heart failure   . Unspecified essential hypertension     Past Surgical History:  Procedure Laterality Date  . BACK SURGERY     took cyst out of back  . CARDIAC CATHETERIZATION    . CARPAL TUNNEL RELEASE Bilateral   . CHOLECYSTECTOMY  06/1998  . COLONOSCOPY  03/03/03  . COLONOSCOPY  08/17/2011   Procedure: COLONOSCOPY;  Surgeon: Rogene Houston, MD;  [facility]: [facility];  Service: Endoscopy;  Laterality: N/A;  1045  . COLONOSCOPY N/A 07/05/2017   Procedure: COLONOSCOPY;  Surgeon: Rogene Houston, MD;  Location: [facility];  Service: Endoscopy;  Laterality: N/A;  2:00  . ESOPHAGOGASTRODUODENOSCOPY (EGD) WITH PROPOFOL N/A 12/20/2017   Procedure: ESOPHAGOGASTRODUODENOSCOPY (EGD) WITH PROPOFOL;  Surgeon: Rogene Houston, MD;  [facility]: [facility];  Service: Endoscopy;  Laterality: N/A;  11:10 :  . LEFT HEART CATHETERIZATION WITH CORONARY ANGIOGRAM N/A 12/07/2013   Procedure: LEFT HEART CATHETERIZATION WITH CORONARY ANGIOGRAM;  Surgeon: Peter M Martinique, MD;  Location: [facility] CATH LAB;  Service: Cardiovascular;  Laterality: N/A;  . left rotator    . POLYPECTOMY  12/20/2017   Procedure: POLYPECTOMY;  Surgeon: Rogene Houston, MD;  Location: [facility];  Service: Endoscopy;;  Gastric (HS) x2  . RIGHT KNEE SURGERY    . ROBOT ASSISTED LAPAROSCOPIC RADICAL PROSTATECTOMY  06/26/2012   Procedure: ROBOTIC ASSISTED LAPAROSCOPIC RADICAL PROSTATECTOMY LEVEL 3;  Surgeon: Dutch Gray, MD;   Location: [facility];  Service: Urology;  Laterality: N/A;      . Two cardiac stents  2009  . UPPER GASTROINTESTINAL ENDOSCOPY  02/21/2011   EGD ED  . UPPER GASTROINTESTINAL ENDOSCOPY  03/03/03   TCS  . VASECTOMY      Current Outpatient Medications  Medication Sig Dispense Refill  . amLODipine (NORVASC) 10 MG tablet Take 10 mg by mouth daily.     Marland Kitchen aspirin EC 81 MG tablet Take 1 tablet (81 mg total) by mouth daily.    Marland Kitchen dicyclomine (BENTYL) 10 MG capsule Take 1 capsule (10 mg total) by mouth every 12 (twelve) hours as needed for spasms (abdominal pain). 60 capsule 2  . ibuprofen (ADVIL,MOTRIN) 200 MG tablet Take 400 mg by mouth every 8 (eight) hours as needed for mild pain or moderate pain.    Marland Kitchen insulin glargine (LANTUS) 100 UNIT/ML injection Inject 40 Units into the skin at bedtime.    Marland Kitchen lisinopril (PRINIVIL,ZESTRIL) 40 MG tablet Take 40 mg by mouth every morning.     . metoprolol tartrate (LOPRESSOR) 50 MG tablet Take 1 tablet (50 mg total) by  mouth 2 (two) times daily. 180 tablet 1  . Multiple Vitamin (MULTI VITAMIN MENS PO) Take 1 tablet by mouth daily.     Marland Kitchen NITROSTAT 0.4 MG SL tablet Place 1 tablet under the tongue every 5 (five) minutes as needed for chest pain. For chest pains    . pantoprazole (PROTONIX) 40 MG tablet Take 40 mg by mouth 2 (two) times daily.     Marland Kitchen spironolactone (ALDACTONE) 25 MG tablet Take 25 mg by mouth 2 (two) times daily.     Marland Kitchen zinc gluconate 50 MG tablet Take 50 mg by mouth daily.     No current facility-administered medications for this visit.   Allergies:  Codeine and Plavix [clopidogrel bisulfate]   Social History: The patient  reports that he quit smoking about 22 years ago. His smoking use included cigarettes. He has a 90.00 pack-year smoking history. He has never used smokeless tobacco. He reports that he does not drink alcohol and does not use drugs.   Family History: The patient's family history includes Breast cancer in his mother and sister;  Coronary artery disease in an other family member; Healthy in his daughter, daughter, daughter, and daughter; Heart disease in his brother, father, and mother; Ovarian cancer in his sister.   ROS:  Please see the history of present illness. Otherwise, complete review of systems is positive for none.  All other systems are reviewed and negative.   Physical Exam: VS:  BP (!) 158/92   Pulse (!) 58   Ht 6' 2"  (1.88 m)   Wt 296 lb 6.4 oz (134.4 kg)   SpO2 95%   BMI 38.06 kg/m , BMI Body mass index is 38.06 kg/m.  Wt Readings from Last 3 Encounters:  12/01/20 296 lb 6.4 oz (134.4 kg)  11/17/20 290 lb (131.5 kg)  05/19/20 296 lb (134.3 kg)    General: Obese patient appears comfortable at rest. Neck: Supple, no elevated JVP or carotid bruits, no thyromegaly. Lungs: Clear to auscultation, nonlabored breathing at rest. Cardiac: Regular rate and rhythm, no S3 or significant systolic murmur, no pericardial rub. Extremities: Mild non-pitting edema, distal pulses 2+. Skin: Warm and dry. Musculoskeletal: No kyphosis. Neuropsychiatric: Alert and oriented x3, affect grossly appropriate.  ECG:  An ECG dated 05/19/2020 was personally reviewed today and demonstrated:  Sinus rhythm with premature atrial complexes rate of 62.  Recent Labwork: 11/17/2020: ALT 27; AST 20; BUN 16; Creat 0.82; Hemoglobin 13.8; Platelets 140; Potassium 4.2; Sodium 138  No results found for: CHOL, TRIG, HDL, CHOLHDL, VLDL, LDLCALC, LDLDIRECT  Other Studies Reviewed Today: Diagnostic Studies 02/2007 Cath FINDINGS: Aortic pressure 131/85 with a mean of 106, left ventricular  pressure is 128/16.  The left mainstem has luminal irregularities but no significant  angiographic stenosis.  The left mainstem bifurcates into the LAD and left circumflex. The LAD  is a large-caliber vessel that courses down and reaches the LV apex.  Proximal portion of the LAD in the area of the first septal perforator  has a diffuse 50% stenosis.  There is a diagonal branch that arises from  this area. The diagonal is widely patent. The remaining portions of  the mid and distal LAD have diffuse nonobstructive plaque without  significant focal stenosis.  The left circumflex is a large-caliber caliber vessel. It gives off a  large first OM branch that has an 80% stenosis in its proximal portion.  The AV groove circumflex continues down and provides a small left  posterolateral branch. There is  no significant angiographic disease in  the AV groove circumflex.  The right coronary artery is diffusely diseased. There are luminal  irregularities in the proximal portion. The midportion in the stented  segment that is ectatic. Distally, there is diffuse disease with 50%  stenosis just before the bifurcation of the PDA and posterior AV  segment. The PDA and posterolateral branches have no significant  angiographic disease  Left ventriculography demonstrates normal LV systolic function with an  LVEF of 60%. There is no mitral regurgitation.  ASSESSMENT:  1. Severe obtuse marginal 1 stenosis.  2. Moderate diffuse disease in the left anterior descending artery and  right coronary artery.  3. Normal left ventricular systolic function.  4. Successful percutaneous coronary intervention of the left  circumflex with a bare-metal stent.  Will continue aspirin indefinitely. The patient should be on  ticlopidine for 30 days with close monitoring of his CBC.   02/2013 Lexiscan MPI Small partially reversible apical to mid anteroseptal defect. Partially reversible mid anterior defect. Fixed mid to basal inferior wall defect. Normal wall motion, thought to be artifact/noted soft tissue attenuation   11/17/13 clinic EKG NSR,    11/2013 Cath Procedural Findings:  Hemodynamics:  AO 110/62 mean 79 mm Hg  LV 113/12 mm Hg  Coronary angiography:  Coronary dominance: right  Left mainstem: Normal  Left anterior descending (LAD): 30% disease in the  proximal vessel. The first diagonal is normal.  Left circumflex (LCx): The LCx gives rise to a large OM 1, a small OM2, and terminates in a posterolateral branch. The stent in the OM 1 is widely patent. No other significant disease in the LCx.  Right coronary artery (RCA): Large vessel. There is 30% disease in the proximal vessel, at the crux, and in the distal RCA.  Left ventriculography: Left ventricular systolic function is normal, LVEF is estimated at 55-65%, there is no significant mitral regurgitation  Final Conclusions:  1. Nonobstructive CAD. The prior stent in OM1 is widely patent.  2. Normal LV function.  Recommendations: Continue medical therapy.   03/2019 echo [facility] Summary  1. Echo contrast utilized to enhance endocardial border definition.  2. The left ventricle is normal in size with mildly increased wall thickness.  3. Normal left ventricular systolic function, ejection fraction 60-65%.  4. Normal right ventricular size and systolic function.  5. The aortic valve is trileaflet with mildly thickened leaflets with normal excursion.  6. Aortic root borderline dilated.  7. Ascending aorta borderline dilated.  Assessment and Plan:  1. Preoperative evaluation to rule out surgical contraindication   2. CAD in native artery   3. Mixed hyperlipidemia   4. Essential hypertension    1. Preoperative evaluation to rule out surgical contraindication At last visit he had pending left knee replacement with Dr. Rodell Perna.  His Duke activity score index = 2 placing him at a perioperative risk of major cardiac events at 6.6%.  His Duke activity score index is 32.95 giving him a functional capacity and METS at 6.79%.  Patient would be considered a low  risk for left total knee replacement under general anesthesia.  Patient states he is having regional anesthesia instead of general.  He has a pending EGD for surveillance of esophageal varices secondary to liver cirrhosis with Dr.  Jenetta Downer.  At Dr. Colman Cater office he was complaining of some increased dyspnea on exertion.  Dr. Jenetta Downer wanted him to be checked out prior to undergoing EGD.  2. CAD in native artery BMS  to the left circumflex 2008 with noted moderate diffuse disease of LAD and RCA treated medically.  Cardiac catheterization 2015, left main patent, LAD 30% proximal, left circumflex patent, OM 2 with patent stent, RCA 30% proximal disease, EF 55 to 65% by LV gram.  Stress test 05/17/2019 moderate reversibility inferior apical inferior wall.  Antianginals titrated and symptoms solved.  Did not want to pursue cardiac catheterization.  Denies any current anginal or exertional symptoms.  Continue aspirin 81 mg, nitroglycerin sublingual 0.4 mg as needed,  3. Mixed hyperlipidemia Continue atorvastatin 10 mg daily.  4. Essential hypertension Blood pressure 158/92  today.  Patient states sometimes he misses a medication dose.  Continue amlodipine 10 mg daily, lisinopril 40 mg daily.,  Metoprolol 50 mg p.o. twice daily.  Spironolactone 25 mg p.o. daily.  5.  DOE Patient recently expressing some increased dyspnea on exertion while feeding his chickens at home [place] farm.  Has a pending EGD for surveillance of esophageal varices by Dr. Jenetta Downer.  Dr. Jenetta Downer wanted him evaluated prior to undergoing EGD.  We will be scheduling follow-up echocardiogram to reassess LV function, diastolic function, valvular function.  Medication Adjustments/Labs and Tests Ordered: Current medicines are reviewed at length with the patient today.  Concerns regarding medicines are outlined above.   Disposition: Follow-up with Dr. Harl Bowie or APP 6 months  Signed, Levell July, NP 12/01/2020 8:17 AM    [facility] [place], [place], [place] [address] Phone: [phone]; Fax: [phone]

## 2020-12-01 ENCOUNTER — Other Ambulatory Visit: Payer: Self-pay

## 2020-12-01 ENCOUNTER — Encounter: Payer: Self-pay | Admitting: Family Medicine

## 2020-12-01 ENCOUNTER — Ambulatory Visit (INDEPENDENT_AMBULATORY_CARE_PROVIDER_SITE_OTHER): Payer: Medicare Other | Admitting: Family Medicine

## 2020-12-01 VITALS — BP 158/92 | HR 58 | Ht 74.0 in | Wt 296.4 lb

## 2020-12-01 DIAGNOSIS — Z01818 Encounter for other preprocedural examination: Secondary | ICD-10-CM | POA: Diagnosis not present

## 2020-12-01 DIAGNOSIS — E782 Mixed hyperlipidemia: Secondary | ICD-10-CM | POA: Diagnosis not present

## 2020-12-01 DIAGNOSIS — R0602 Shortness of breath: Secondary | ICD-10-CM

## 2020-12-01 DIAGNOSIS — I10 Essential (primary) hypertension: Secondary | ICD-10-CM

## 2020-12-01 DIAGNOSIS — I2510 Atherosclerotic heart disease of native coronary artery without angina pectoris: Secondary | ICD-10-CM | POA: Diagnosis not present

## 2020-12-01 NOTE — Patient Instructions (Signed)

## 2020-12-07 ENCOUNTER — Other Ambulatory Visit: Payer: Self-pay

## 2020-12-07 ENCOUNTER — Ambulatory Visit (INDEPENDENT_AMBULATORY_CARE_PROVIDER_SITE_OTHER): Payer: Medicare Other

## 2020-12-07 DIAGNOSIS — R0602 Shortness of breath: Secondary | ICD-10-CM | POA: Diagnosis not present

## 2020-12-07 LAB — ECHOCARDIOGRAM COMPLETE
AR max vel: 3.33 cm2
AV Area VTI: 3.33 cm2
AV Area mean vel: 3.26 cm2
AV Mean grad: 4.3 mmHg
AV Peak grad: 8 mmHg
Ao pk vel: 1.41 m/s
Area-P 1/2: 2.24 cm2
Calc EF: 56.4 %
MV M vel: 3.04 m/s
MV Peak grad: 37 mmHg
S' Lateral: 3.95 cm
Single Plane A2C EF: 54.4 %
Single Plane A4C EF: 55.9 %

## 2020-12-12 ENCOUNTER — Telehealth: Payer: Self-pay | Admitting: *Deleted

## 2020-12-12 NOTE — Telephone Encounter (Signed)
Laurine Blazer, LPN  12/09/4358 1:65 PM EDT Back to Top     Patient notified via detailed message,. Copy to pcp.    Laurine Blazer, LPN  8/00/6349 49:44 AM EDT      Busy.    Verta Ellen., NP  12/07/2020 5:39 PM EDT      Please call the patient and let him know the echocardiogram shows the pumping function of his heart looks good. The main pumping chamber is a little more muscular and stiff than normal. Best management is to keep BP at or below 130/80.

## 2020-12-22 ENCOUNTER — Ambulatory Visit (INDEPENDENT_AMBULATORY_CARE_PROVIDER_SITE_OTHER): Payer: Medicare Other | Admitting: Orthopaedic Surgery

## 2020-12-22 ENCOUNTER — Other Ambulatory Visit: Payer: Self-pay

## 2020-12-22 DIAGNOSIS — M1712 Unilateral primary osteoarthritis, left knee: Secondary | ICD-10-CM

## 2020-12-22 NOTE — Progress Notes (Signed)
Office Visit Note   Patient: Billy Davis           Date of Birth: [birth year]           MRN: [id number] Visit Date: 12/22/2020              Requested by: [name], MD [address] [place],  [place] [address] PCP: [name], MD   Assessment & Plan: Visit Diagnoses:  1. Unilateral primary osteoarthritis, left knee     Plan: Patient is lost weight got his BMI below 40 he will get his A1c back below 7.7 and call us for prescription surgical scheduling a left total knee arthroplasty under spinal anesthesia with planned overnight observation.  Wife is present with him today she has diabetes and is on insulin and states her A1c stays below 7.5.  She is working hard with him to improve his compliance and diet.  He will call once he gets his A1c below 7.7.  Today we answered all his questions about total knee arthroplasty preoperative block, spinal anesthesia, home therapy, outpatient therapy etc.  Follow-Up Instructions: No follow-ups on file.   Orders:  No orders of the defined types were placed in this encounter.  No orders of the defined types were placed in this encounter.     Procedures: No procedures performed   Clinical Data: No additional findings.   Subjective: Chief Complaint  Patient presents with  . Left Knee - Pain    Discuss knee surgery    HPI 65 year old male long-term patient has had previous carpal tunnel release by me is here to discuss left total knee arthroplasty.  He had lost some weight and states his sugars were getting low he was on 90 units of insulin and states he had stopped taking his insulin due to low sugars and now his A1c is climbed up to 8.8.  Patient is a former smoker quit 10 years ago.  Knee showed significant osteoarthritis changes in his left knee he states the pain is now constant and he wants note when he needs to do besides the weight loss he is already achieved to get his BMI below 40 to get left total knee arthroplasty.  Today had many  questions about infection, stiffness, therapy, anesthesia choices etc.  Review of Systems systems update unchanged from last visit.   Objective: Vital Signs: There were no vitals taken for this visit.  Physical Exam Constitutional:      Appearance: He is well-developed.  HENT:     Head: Normocephalic and atraumatic.  Eyes:     Pupils: Pupils are equal, round, and reactive to light.  Neck:     Thyroid: No thyromegaly.     Trachea: No tracheal deviation.  Cardiovascular:     Rate and Rhythm: Normal rate.  Pulmonary:     Effort: Pulmonary effort is normal.     Breath sounds: No wheezing.  Abdominal:     General: Bowel sounds are normal.     Palpations: Abdomen is soft.  Skin:    General: Skin is warm and dry.     Capillary Refill: Capillary refill takes less than 2 seconds.  Neurological:     Mental Status: He is alert and oriented to person, place, and time.  Psychiatric:        Behavior: Behavior normal.        Thought Content: Thought content normal.        Judgment: Judgment normal.     Ortho  Exam previous lumbar fusion L5-S1 Dr. Joya Salm with some postop anterior tib weakness on the right.  Partial foot drop.  Left knee crepitusWith range of motion he lacks 5 to 8 degrees reaching full extension can flex to 120 degrees left knee.  Distal pulse palpable.  ACL PCL is stable.  Negative logroll of the hips.  Well-healed lumbar incision.  Partial foot drop on the right without brace. Specialty Comments:  No specialty comments available.  Imaging: No results found.   PMFS History: Patient Active Problem List   Diagnosis Date Noted  . Abdominal pain, chronic, epigastric 11/17/2020  . Shortness of breath 11/17/2020  . Diarrhea 11/17/2020  . Bilateral leg edema 10/26/2019  . Erroneous encounter - disregard 10/09/2019  . Synovitis of left knee 08/27/2019  . Body mass index 40.0-44.9, adult ([place]) 08/27/2019  . Unilateral primary osteoarthritis, left knee 08/13/2019  .  Esophageal varices without bleeding ([place]) 06/22/2019  . Cirrhosis, non-alcoholic ([place]) 02/54/2706  . Unilateral primary osteoarthritis, right knee 10/24/2017  . Rectal bleeding 06/06/2017  . 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 DM (diabetes mellitus) ([place]) 11/09/2013  . Unspecified diastolic heart failure   . Morbid obesity ([place])   . IBS (irritable bowel syndrome)   . Ejection fraction   . CAD, multiple vessel   . Contrast media allergy   . Aneurysm ([place])   . Preop cardiovascular exam   . Prostate cancer ([place]) 03/24/2012  . Diastolic dysfunction 23/76/2831  . GERD (gastroesophageal reflux disease) 12/27/2010  . Hypertension 12/27/2010  . DYSLIPIDEMIA 12/26/2009  . FATTY LIVER DISEASE 12/26/2009   Past Medical History:  Diagnosis Date  . Aneurysm ([place])    right femoral pseudoaneurysm, thrombosed (following 03/20/07 cardiac cath)  . CAD, multiple vessel    PCI circumflex 2008, diffuse disease of the right and LAD treated medically.,  //   Nuclear 2010 no ischemia   //   nuclear May, 2012 no ischemia  . Cirrhosis of liver without mention of alcohol   . Contrast media allergy    pt states he is not allergy to contrast  . Diabetes mellitus without complication ([place])    type 2  . Diverticulosis   . Dysphagia   . Ejection fraction    EF 55%, nuclear, May, 2012  . Esophageal reflux   . IBS (irritable bowel syndrome)   . Lower abdominal pain   . Mixed dyslipidemia   . Morbid obesity ([place])   . Other chronic nonalcoholic liver disease   . Postsurgical percutaneous transluminal coronary angioplasty status   . Preop cardiovascular exam    Cardiovascular clearance for prostate surgery October, 2013  . Prostate cancer ([place]) 03/24/12  . Unspecified diastolic heart failure   . Unspecified essential hypertension     Family History  Problem Relation Age of Onset  . Heart disease Mother   . Breast cancer Mother   . Heart disease Father   . Breast  cancer Sister   . Ovarian cancer Sister   . Heart disease Brother   . Healthy Daughter   . Healthy Daughter   . Healthy Daughter   . Healthy Daughter   . Coronary artery disease Other        family history    Past Surgical History:  Procedure Laterality Date  . BACK SURGERY     took cyst out of back  . CARDIAC CATHETERIZATION    . CARPAL TUNNEL RELEASE Bilateral   . CHOLECYSTECTOMY  06/1998  . COLONOSCOPY  03/03/03  . COLONOSCOPY  08/17/2011   Procedure: COLONOSCOPY;  Surgeon: Rogene Houston, MD;  Location: [facility];  Service: Endoscopy;  Laterality: N/A;  1045  . COLONOSCOPY N/A 07/05/2017   Procedure: COLONOSCOPY;  Surgeon: Rogene Houston, MD;  Location: [facility];  Service: Endoscopy;  Laterality: N/A;  2:00  . ESOPHAGOGASTRODUODENOSCOPY (EGD) WITH PROPOFOL N/A 12/20/2017   Procedure: ESOPHAGOGASTRODUODENOSCOPY (EGD) WITH PROPOFOL;  Surgeon: Rogene Houston, MD;  Location: [facility];  Service: Endoscopy;  Laterality: N/A;  11:10 :  . LEFT HEART CATHETERIZATION WITH CORONARY ANGIOGRAM N/A 12/07/2013   Procedure: LEFT HEART CATHETERIZATION WITH CORONARY ANGIOGRAM;  Surgeon: Peter M Martinique, MD;  Location: [facility] CATH LAB;  Service: Cardiovascular;  Laterality: N/A;  . left rotator    . POLYPECTOMY  12/20/2017   Procedure: POLYPECTOMY;  Surgeon: Rogene Houston, MD;  Location: [facility];  Service: Endoscopy;;  Gastric ([facility]) x2  . RIGHT KNEE SURGERY    . ROBOT ASSISTED LAPAROSCOPIC RADICAL PROSTATECTOMY  06/26/2012   Procedure: ROBOTIC ASSISTED LAPAROSCOPIC RADICAL PROSTATECTOMY LEVEL 3;  Surgeon: Dutch Gray, MD;  Location: [facility];  Service: Urology;  Laterality: N/A;      . Two cardiac stents  2009  . UPPER GASTROINTESTINAL ENDOSCOPY  02/21/2011   EGD ED  . UPPER GASTROINTESTINAL ENDOSCOPY  03/03/03   TCS  . VASECTOMY     Social History   Occupational History  . Occupation: [employer]  Tobacco Use  . Smoking status: Former Smoker    Packs/day: 3.00    Years:  30.00    Pack years: 90.00    Types: Cigarettes    Quit date: 08/27/1998    Years since quitting: 22.3  . Smokeless tobacco: Never Used  . Tobacco comment: Pt quit smoking 10 yrs ago, smoked for about 30 yrs.  Substance and Sexual Activity  . Alcohol use: No    Alcohol/week: 0.0 standard drinks  . Drug use: No  . Sexual activity: Not on file

## 2021-02-28 ENCOUNTER — Telehealth: Payer: Self-pay | Admitting: Radiology

## 2021-02-28 NOTE — Telephone Encounter (Signed)
Please see message from [place] office below and advise.  He called and says he had his A1C checked and it is now 7.8.  Is that good enough for him to have his knee surgery?  His number is [phone] or [phone].

## 2021-03-01 NOTE — Telephone Encounter (Signed)
I called, no answer. Will try again

## 2021-03-03 NOTE — Telephone Encounter (Signed)
I left voicemail for patient advising. 

## 2021-05-23 ENCOUNTER — Ambulatory Visit (INDEPENDENT_AMBULATORY_CARE_PROVIDER_SITE_OTHER): Payer: Medicare Other | Admitting: Internal Medicine

## 2021-06-15 ENCOUNTER — Other Ambulatory Visit (INDEPENDENT_AMBULATORY_CARE_PROVIDER_SITE_OTHER): Payer: Self-pay | Admitting: Gastroenterology

## 2021-06-15 DIAGNOSIS — G8929 Other chronic pain: Secondary | ICD-10-CM

## 2021-06-15 NOTE — Telephone Encounter (Signed)
Last seen 11/17/2020 cirrhosis.

## 2021-06-17 ENCOUNTER — Other Ambulatory Visit (INDEPENDENT_AMBULATORY_CARE_PROVIDER_SITE_OTHER): Payer: Self-pay | Admitting: Gastroenterology

## 2021-06-17 DIAGNOSIS — G8929 Other chronic pain: Secondary | ICD-10-CM

## 2021-06-17 DIAGNOSIS — R1013 Epigastric pain: Secondary | ICD-10-CM

## 2021-07-31 ENCOUNTER — Other Ambulatory Visit (INDEPENDENT_AMBULATORY_CARE_PROVIDER_SITE_OTHER): Payer: Self-pay | Admitting: *Deleted

## 2021-07-31 DIAGNOSIS — G8929 Other chronic pain: Secondary | ICD-10-CM

## 2021-07-31 MED ORDER — DICYCLOMINE HCL 10 MG PO CAPS: ORAL_CAPSULE | ORAL | 2 refills | Status: DC

## 2021-08-22 ENCOUNTER — Other Ambulatory Visit: Payer: Self-pay

## 2021-08-22 ENCOUNTER — Ambulatory Visit (INDEPENDENT_AMBULATORY_CARE_PROVIDER_SITE_OTHER): Payer: Medicare Other | Admitting: Internal Medicine

## 2021-08-22 ENCOUNTER — Encounter (INDEPENDENT_AMBULATORY_CARE_PROVIDER_SITE_OTHER): Payer: Self-pay | Admitting: Internal Medicine

## 2021-08-22 VITALS — BP 175/88 | HR 68 | Temp 98.3°F | Ht 74.0 in | Wt 306.9 lb

## 2021-08-22 DIAGNOSIS — K580 Irritable bowel syndrome with diarrhea: Secondary | ICD-10-CM

## 2021-08-22 DIAGNOSIS — K219 Gastro-esophageal reflux disease without esophagitis: Secondary | ICD-10-CM | POA: Diagnosis not present

## 2021-08-22 DIAGNOSIS — K7460 Unspecified cirrhosis of liver: Secondary | ICD-10-CM | POA: Diagnosis not present

## 2021-08-22 MED ORDER — PHAZYME MAXIMUM STRENGTH 250 MG PO CAPS: 250.0000 mg | ORAL_CAPSULE | Freq: Two times a day (BID) | ORAL | Status: DC | PRN

## 2021-08-22 NOTE — Progress Notes (Signed)
Presenting complaint;  Follow for chronic liver disease, GERD and IBS.  Database and subjective:  Patient is 65 year old Caucasian male who is here for scheduled visit.  He was last seen in March 2022 by Dr. Jenetta Downer. 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  He also has chronic GERD and IBS.  Last colonoscopy was in November 2018 revealing cecal and sigmoid diverticulosis and internal hemorrhoids. Last EGD was in April 2019 revealing grade 1 esophageal varices.  He has not had a follow-up exam.  He says he is doing well as far as heartburn is concerned.  He is taking pantoprazole twice daily.  He may have 2-3 episodes per week but these do not last long.  He denies nausea vomiting or dysphagia.  He remains with diarrhea.  On most days he has 2-3 bowel movements.  He is taking dicyclomine usually once a day.  He denies melena or rectal bleeding.  His appetite is good.  He has gained 10 pounds since his last visit 9 months ago.  He was scheduled to have left knee replacement but his A1c did not go down so therefore surgery was postponed.  He says he got frustrated and decided not to proceed any further.  He says he is taking ibuprofen no more than once or twice a month.  He says he walks for 60 to 90 minutes daily but he is not losing any weight. He complains of chest symptom which he cannot describe detail.  He says he takes 3 aspirins and when he burps he feels better.  He says he has has had 2 episodes in the last 1 month and took 3 aspirins each time.  He also complains of exertional dyspnea but does not have any chest pain. He is agreeable to see dietitian to help manage his diet and calories so that he can lose some weight.   Current Medications: Outpatient Encounter Medications as of 08/22/2021  Medication Sig   amLODipine (NORVASC) 10 MG tablet Take 10 mg by mouth daily.    aspirin EC 81 MG tablet Take 1 tablet (81 mg total) by mouth daily.    dicyclomine (BENTYL) 10 MG capsule TAKE 1 CAPSULE BY MOUTH EVERY 12 HOURS AS NEEDED FOR  SPASMS  (ABDOMINAL  PAIN)   ibuprofen (ADVIL,MOTRIN) 200 MG tablet Take 400 mg by mouth every 8 (eight) hours as needed for mild pain or moderate pain.   insulin glargine (LANTUS) 100 UNIT/ML injection Inject into the skin at bedtime. 50 qam and 45 units qhs   lisinopril (PRINIVIL,ZESTRIL) 40 MG tablet Take 40 mg by mouth every morning.    metoprolol tartrate (LOPRESSOR) 50 MG tablet Take 1 tablet (50 mg total) by mouth 2 (two) times daily.   Multiple Vitamin (MULTI VITAMIN MENS PO) Take 1 tablet by mouth daily.    NITROSTAT 0.4 MG SL tablet Place 1 tablet under the tongue every 5 (five) minutes as needed for chest pain. For chest pains   pantoprazole (PROTONIX) 40 MG tablet Take 40 mg by mouth 2 (two) times daily.    spironolactone (ALDACTONE) 25 MG tablet Take 25 mg by mouth 2 (two) times daily.    zinc gluconate 50 MG tablet Take 50 mg by mouth daily.   No facility-administered encounter medications on file as of 08/22/2021.     Objective: Blood pressure (!) 175/88, pulse 68, temperature 98.3 F (36.8 C), temperature source Oral, height 6' 2"  (1.88 m), weight (!) 306 lb 14.4  oz (139.2 kg). Patient is alert and in no acute distress. He does not have asterixis. Conjunctiva is pink. Sclera is nonicteric Oropharyngeal mucosa is normal. No neck masses or thyromegaly noted. Cardiac exam with regular rhythm normal S1 and S2. No murmur or gallop noted. Lungs are clear to auscultation. Abdomen is full.  On palpation it is soft and nontender.  Spleen tip is easily palpable.  Liver edge is indistinct.  Flanks are not bulging or dull. He has 1-2+ pitting edema involving both legs.  Labs/studies Results:   CBC Latest Ref Rng & Units 11/17/2020 10/26/2019 03/31/2019  WBC 3.8 - 10.8 Thousand/uL 5.5 6.5 5.8  Hemoglobin 13.2 - 17.1 g/dL 13.8 14.6 14.6  Hematocrit 38.5 - 50.0 % 42.2 43.2 43.5  Platelets 140 - 400  Thousand/uL 140 165 151    CMP Latest Ref Rng & Units 11/17/2020 10/26/2019 03/31/2019  Glucose 65 - 139 mg/dL 222(H) 176(H) 181(H)  BUN 7 - 25 mg/dL 16 18 9   Creatinine 0.70 - 1.25 mg/dL 0.82 0.89 0.76  Sodium 135 - 146 mmol/L 138 140 139  Potassium 3.5 - 5.3 mmol/L 4.2 4.1 3.8  Chloride 98 - 110 mmol/L 105 104 104  CO2 20 - 32 mmol/L 24 27 25   Calcium 8.6 - 10.3 mg/dL 9.1 9.5 9.3  Total Protein 6.1 - 8.1 g/dL 6.8 7.1 7.0  Total Bilirubin 0.2 - 1.2 mg/dL 0.8 0.9 1.0  Alkaline Phos 38 - 126 U/L - - -  AST 10 - 35 U/L 20 33 32  ALT 9 - 46 U/L 27 37 37    Hepatic Function Latest Ref Rng & Units 11/17/2020 10/26/2019 03/31/2019  Total Protein 6.1 - 8.1 g/dL 6.8 7.1 7.0  Albumin 3.5 - 5.0 g/dL - - -  AST 10 - 35 U/L 20 33 32  ALT 9 - 46 U/L 27 37 37  Alk Phosphatase 38 - 126 U/L - - -  Total Bilirubin 0.2 - 1.2 mg/dL 0.8 0.9 1.0  Bilirubin, Direct 0.0 - 0.2 mg/dL - - -      Assessment:  #1.  Cirrhosis secondary to NASH.  He has had fatty liver disease for more than 30 years.  Cirrhosis was diagnosed over 20 years ago.  Unfortunately he has never been able to bring his BMI down to close to her desirable range.  Hepatic function remains well-preserved but he has developed splenomegaly and EGD in November 2018 revealed grade 1 esophageal varices.  #2.  Chronic GERD.  He is requiring double dose PPI therapy.  #3.  IBS.  He is doing well with low-dose dicyclomine.  #4.  Exertional dyspnea probably related to obesity and medications.  Doubt that he has developed ascites.  He should follow-up with his cardiologist to make sure cardiac function has not deteriorated.  #5.  Atypical chest pain relieved with burping.  He can try Phazyme.   Plan:  Patient will go to the lab for CBC with differential, comprehensive chemistry panel, INR and alpha-fetoprotein. Schedule abdominal ultrasound. Phazyme 250 mg p.o. twice daily as needed. Dietary consultation in order to help patient manage calories and  try to lose some weight. He will also talk with primary care physician about getting pneumococcal and shingles vaccine.  He has received hepatitis a and B vaccines in the past. Esophagogastroduodenoscopy to assess esophageal varices. Office visit in 6 months.

## 2021-08-22 NOTE — Patient Instructions (Signed)
Physician will call with results of blood work and [place] when completed. Dietary consult

## 2021-08-23 LAB — COMPREHENSIVE METABOLIC PANEL
AG Ratio: 1.8 (calc) (ref 1.0–2.5)
ALT: 33 U/L (ref 9–46)
AST: 27 U/L (ref 10–35)
Albumin: 4.4 g/dL (ref 3.6–5.1)
Alkaline phosphatase (APISO): 81 U/L (ref 35–144)
BUN: 15 mg/dL (ref 7–25)
CO2: 28 mmol/L (ref 20–32)
Calcium: 9.4 mg/dL (ref 8.6–10.3)
Chloride: 104 mmol/L (ref 98–110)
Creat: 0.85 mg/dL (ref 0.70–1.35)
Globulin: 2.5 g/dL (calc) (ref 1.9–3.7)
Glucose, Bld: 175 mg/dL — ABNORMAL HIGH (ref 65–99)
Potassium: 3.7 mmol/L (ref 3.5–5.3)
Sodium: 141 mmol/L (ref 135–146)
Total Bilirubin: 0.7 mg/dL (ref 0.2–1.2)
Total Protein: 6.9 g/dL (ref 6.1–8.1)

## 2021-08-23 LAB — CBC WITH DIFFERENTIAL/PLATELET
Absolute Monocytes: 454 cells/uL (ref 200–950)
Basophils Absolute: 70 cells/uL (ref 0–200)
Basophils Relative: 1.1 %
Eosinophils Absolute: 173 cells/uL (ref 15–500)
Eosinophils Relative: 2.7 %
HCT: 45 % (ref 38.5–50.0)
Hemoglobin: 14.9 g/dL (ref 13.2–17.1)
Lymphs Abs: 1619 cells/uL (ref 850–3900)
MCH: 27.5 pg (ref 27.0–33.0)
MCHC: 33.1 g/dL (ref 32.0–36.0)
MCV: 83 fL (ref 80.0–100.0)
MPV: 12.2 fL (ref 7.5–12.5)
Monocytes Relative: 7.1 %
Neutro Abs: 4083 cells/uL (ref 1500–7800)
Neutrophils Relative %: 63.8 %
Platelets: 141 10*3/uL (ref 140–400)
RBC: 5.42 10*6/uL (ref 4.20–5.80)
RDW: 14.2 % (ref 11.0–15.0)
Total Lymphocyte: 25.3 %
WBC: 6.4 10*3/uL (ref 3.8–10.8)

## 2021-08-23 LAB — AFP TUMOR MARKER: AFP-Tumor Marker: 2.2 ng/mL (ref <6.1)

## 2021-08-23 LAB — PROTIME-INR
INR: 1.1
Prothrombin Time: 11.1 s (ref 9.0–11.5)

## 2021-09-08 ENCOUNTER — Ambulatory Visit (HOSPITAL_COMMUNITY)
Admission: RE | Admit: 2021-09-08 | Discharge: 2021-09-08 | Disposition: A | Discharge status: Home / Self Care | Payer: Medicare Other | Source: Ambulatory Visit | Attending: Internal Medicine | Admitting: Internal Medicine

## 2021-09-08 ENCOUNTER — Other Ambulatory Visit: Payer: Self-pay

## 2021-09-08 DIAGNOSIS — K7460 Unspecified cirrhosis of liver: Secondary | ICD-10-CM | POA: Insufficient documentation

## 2021-09-17 ENCOUNTER — Emergency Department (HOSPITAL_COMMUNITY)
Admission: EM | Admit: 2021-09-17 | Discharge: 2021-09-17 | Disposition: A | Discharge status: Home / Self Care | Payer: Medicare Other | Attending: Emergency Medicine | Admitting: Emergency Medicine

## 2021-09-17 ENCOUNTER — Other Ambulatory Visit: Payer: Self-pay

## 2021-09-17 ENCOUNTER — Encounter (HOSPITAL_COMMUNITY): Payer: Self-pay | Admitting: Emergency Medicine

## 2021-09-17 ENCOUNTER — Emergency Department (HOSPITAL_COMMUNITY): Payer: Medicare Other

## 2021-09-17 DIAGNOSIS — R0602 Shortness of breath: Secondary | ICD-10-CM | POA: Diagnosis not present

## 2021-09-17 DIAGNOSIS — R0600 Dyspnea, unspecified: Secondary | ICD-10-CM | POA: Insufficient documentation

## 2021-09-17 DIAGNOSIS — E1165 Type 2 diabetes mellitus with hyperglycemia: Secondary | ICD-10-CM | POA: Diagnosis not present

## 2021-09-17 DIAGNOSIS — Z8546 Personal history of malignant neoplasm of prostate: Secondary | ICD-10-CM | POA: Insufficient documentation

## 2021-09-17 DIAGNOSIS — Z20822 Contact with and (suspected) exposure to covid-19: Secondary | ICD-10-CM | POA: Diagnosis not present

## 2021-09-17 DIAGNOSIS — Z79899 Other long term (current) drug therapy: Secondary | ICD-10-CM | POA: Diagnosis not present

## 2021-09-17 DIAGNOSIS — R0609 Other forms of dyspnea: Secondary | ICD-10-CM

## 2021-09-17 DIAGNOSIS — I10 Essential (primary) hypertension: Secondary | ICD-10-CM | POA: Diagnosis not present

## 2021-09-17 DIAGNOSIS — Z794 Long term (current) use of insulin: Secondary | ICD-10-CM | POA: Insufficient documentation

## 2021-09-17 DIAGNOSIS — R600 Localized edema: Secondary | ICD-10-CM | POA: Diagnosis not present

## 2021-09-17 DIAGNOSIS — R079 Chest pain, unspecified: Secondary | ICD-10-CM | POA: Diagnosis present

## 2021-09-17 DIAGNOSIS — R0789 Other chest pain: Secondary | ICD-10-CM | POA: Insufficient documentation

## 2021-09-17 LAB — CBC
HCT: 44 % (ref 39.0–52.0)
Hemoglobin: 15.1 g/dL (ref 13.0–17.0)
MCH: 28.8 pg (ref 26.0–34.0)
MCHC: 34.3 g/dL (ref 30.0–36.0)
MCV: 84 fL (ref 80.0–100.0)
Platelets: 126 10*3/uL — ABNORMAL LOW (ref 150–400)
RBC: 5.24 MIL/uL (ref 4.22–5.81)
RDW: 13.8 % (ref 11.5–15.5)
WBC: 5.3 10*3/uL (ref 4.0–10.5)
nRBC: 0 % (ref 0.0–0.2)

## 2021-09-17 LAB — COMPREHENSIVE METABOLIC PANEL
ALT: 37 U/L (ref 0–44)
AST: 28 U/L (ref 15–41)
Albumin: 4.2 g/dL (ref 3.5–5.0)
Alkaline Phosphatase: 79 U/L (ref 38–126)
Anion gap: 9 (ref 5–15)
BUN: 16 mg/dL (ref 8–23)
CO2: 24 mmol/L (ref 22–32)
Calcium: 8.8 mg/dL — ABNORMAL LOW (ref 8.9–10.3)
Chloride: 105 mmol/L (ref 98–111)
Creatinine, Ser: 1 mg/dL (ref 0.61–1.24)
GFR, Estimated: >60 mL/min (ref >60)
Glucose, Bld: 244 mg/dL — ABNORMAL HIGH (ref 70–99)
Potassium: 3.4 mmol/L — ABNORMAL LOW (ref 3.5–5.1)
Sodium: 138 mmol/L (ref 135–145)
Total Bilirubin: 0.3 mg/dL (ref 0.3–1.2)
Total Protein: 7 g/dL (ref 6.5–8.1)

## 2021-09-17 LAB — URINALYSIS, ROUTINE W REFLEX MICROSCOPIC
Bacteria, UA: NONE SEEN
Bilirubin Urine: NEGATIVE
Glucose, UA: >=500 mg/dL — AB
Hgb urine dipstick: NEGATIVE
Ketones, ur: NEGATIVE mg/dL
Leukocytes,Ua: NEGATIVE
Nitrite: NEGATIVE
Protein, ur: NEGATIVE mg/dL
Specific Gravity, Urine: 1.018 (ref 1.005–1.030)
pH: 6 (ref 5.0–8.0)

## 2021-09-17 LAB — TROPONIN I (HIGH SENSITIVITY)
Troponin I (High Sensitivity): 8 ng/L (ref <18)
Troponin I (High Sensitivity): 8 ng/L (ref <18)

## 2021-09-17 LAB — BRAIN NATRIURETIC PEPTIDE: B Natriuretic Peptide: 25 pg/mL (ref 0.0–100.0)

## 2021-09-17 LAB — PROTIME-INR
INR: 1.1 (ref 0.8–1.2)
Prothrombin Time: 14.1 seconds (ref 11.4–15.2)

## 2021-09-17 LAB — RESP PANEL BY RT-PCR (FLU A&B, COVID) ARPGX2
Influenza A by PCR: NEGATIVE
Influenza B by PCR: NEGATIVE
SARS Coronavirus 2 by RT PCR: NEGATIVE

## 2021-09-17 LAB — MAGNESIUM: Magnesium: 2 mg/dL (ref 1.7–2.4)

## 2021-09-17 LAB — LIPASE, BLOOD: Lipase: 44 U/L (ref 11–51)

## 2021-09-17 LAB — APTT: aPTT: 27 seconds (ref 24–36)

## 2021-09-17 MED ORDER — NITROGLYCERIN 0.4 MG SL SUBL: 0.4000 mg | SUBLINGUAL_TABLET | SUBLINGUAL | Status: DC | PRN

## 2021-09-17 MED ORDER — FUROSEMIDE 10 MG/ML IJ SOLN
40.0000 mg | INTRAMUSCULAR | Status: AC
  Administered 2021-09-17: 40 mg via INTRAVENOUS
  Filled 2021-09-17: qty 4

## 2021-09-17 MED ORDER — ASPIRIN 81 MG PO CHEW
324.0000 mg | CHEWABLE_TABLET | Freq: Once | ORAL | Status: DC
  Filled 2021-09-17: qty 4

## 2021-09-17 MED ORDER — SODIUM CHLORIDE 0.9 % IV SOLN: INTRAVENOUS | Status: DC

## 2021-09-17 NOTE — ED Triage Notes (Signed)
Pt c/o chest tightness, SOB, dizziness x 2 days.

## 2021-09-17 NOTE — ED Notes (Signed)
Pt stated that he took 98m of aspirin this morning and three 3286mof aspirin after 1pm today.

## 2021-09-17 NOTE — Discharge Instructions (Signed)
I have recommended that you stay in the emergency department until we are able to get a hold of the heart doctor to help come up with a plan.  Sometimes they like to admit patients to the hospital to get further testing on the heart however you have declined and would rather go home.  If you should develop severe or worsening symptoms I want you to come back to the ER immediately otherwise call your heart doctor in the morning.  To be cleared by going home tonight you are definitely at risk of having a worsening condition including a potential heart attack.  If you change your mind you may come back at any time for further testing treatment and stabilizing care

## 2021-09-17 NOTE — ED Provider Notes (Signed)
[facility] EMERGENCY DEPARTMENT Provider Note   CSN: [id number] Arrival date & time: 09/17/21  1452     History  Chief Complaint  Patient presents with   Chest Pain    Billy Davis is a 66 y.o. male.  HPI  This patient is a 66 year old male, he reports a history of diabetes on insulin, hypertension on metoprolol, liver disease on spironolactone, he also takes amlodipine.  The patient reports to me that he is taking a baby aspirin every day as he has a history of heart disease with several stents placed, according to the medical record he had a cardiology visit about 11 years ago with Dr. Lutricia Feil, during that visit it is documented that he had prior stent which was bare-metal stent to the circumflex artery in 2008, he had a heart catheterization after that which showed diffuse disease in the right coronary and left anterior descending but had a normal nuclear study in 2010.  He had an echocardiogram April 2022, ejection fraction reportedly 60 to 65% with grade 1 diastolic dysfunction.  The patient states that over the last 2 or 3 days he has had a pressure in his chest, he has had associated feeling of shortness of breath, decreased energy, this is manifested by his exertional symptoms over the last couple of days.  He reports in fact for the last month he has been trying to get out to feed his chickens which she does every day, as he feeds the chickens, he becomes significantly exertionally dyspneic and has increasing chest discomfort.  At the same time the patient has noted significant increase swelling in his legs although he does report that he is chronically edematous.  Further review of the medical record shows that the patient does have cirrhosis, he is followed by gastroenterology with Dr. Melony Overly, he is also on amlodipine which could cause peripheral edema.  It also shows that the patient has a history of prostate cancer status post prostatectomy with no metastatic disease or other  history of cancer.  Home Medications Prior to Admission medications   Medication Sig Start Date End Date Taking? Authorizing Provider  amLODipine (NORVASC) 10 MG tablet Take 10 mg by mouth daily.  04/01/16   [provider]  aspirin EC 81 MG tablet Take 1 tablet (81 mg total) by mouth daily. 12/27/17   Rehman, Mechele Dawley, MD  dicyclomine (BENTYL) 10 MG capsule TAKE 1 CAPSULE BY MOUTH EVERY 12 HOURS AS NEEDED FOR  SPASMS  (ABDOMINAL  PAIN) 07/31/21   Montez Morita, Quillian Quince, MD  ibuprofen (ADVIL,MOTRIN) 200 MG tablet Take 400 mg by mouth every 8 (eight) hours as needed for mild pain or moderate pain.    [provider]  insulin glargine (LANTUS) 100 UNIT/ML injection Inject into the skin at bedtime. 50 qam and 45 units qhs    [provider]  lisinopril (PRINIVIL,ZESTRIL) 40 MG tablet Take 40 mg by mouth every morning.  10/16/10   [provider]  metoprolol tartrate (LOPRESSOR) 50 MG tablet Take 1 tablet (50 mg total) by mouth 2 (two) times daily. 11/24/19   Arnoldo Lenis, MD  Multiple Vitamin (MULTI VITAMIN MENS PO) Take 1 tablet by mouth daily.     [provider]  NITROSTAT 0.4 MG SL tablet Place 1 tablet under the tongue every 5 (five) minutes as needed for chest pain. For chest pains 12/06/10   [provider]  pantoprazole (PROTONIX) 40 MG tablet Take 40 mg by mouth 2 (  two) times daily.     [provider]  Simethicone (PHAZYME MAXIMUM STRENGTH) 250 MG CAPS Take 250 mg by mouth 2 (two) times daily as needed. 08/22/21   Rogene Houston, MD  spironolactone (ALDACTONE) 25 MG tablet Take 25 mg by mouth 2 (two) times daily.  09/20/18   [provider]  zinc gluconate 50 MG tablet Take 50 mg by mouth daily.    [provider]      Allergies    Codeine and Plavix [clopidogrel bisulfate]    Review of Systems   Review of Systems  All other systems reviewed and are negative.  Physical Exam Updated Vital Signs BP  (!) 155/82    Pulse 68    Temp 97.9 F (36.6 C) (Oral)    Resp (!) 21    Ht 1.88 m (6' 2" )    Wt (!) 141.1 kg    SpO2 96%    BMI 39.93 kg/m  Physical Exam Vitals and nursing note reviewed.  Constitutional:      General: He is not in acute distress.    Appearance: He is well-developed.  HENT:     Head: Normocephalic and atraumatic.     Mouth/Throat:     Pharynx: No oropharyngeal exudate.  Eyes:     General: No scleral icterus.       Right eye: No discharge.        Left eye: No discharge.     Conjunctiva/sclera: Conjunctivae normal.     Pupils: Pupils are equal, round, and reactive to light.  Neck:     Thyroid: No thyromegaly.     Vascular: No JVD.  Cardiovascular:     Rate and Rhythm: Normal rate and regular rhythm.     Heart sounds: Normal heart sounds. No murmur heard.   No friction rub. No gallop.  Pulmonary:     Effort: Pulmonary effort is normal. No respiratory distress.     Breath sounds: Normal breath sounds. No wheezing or rales.  Abdominal:     General: Bowel sounds are normal. There is no distension.     Palpations: Abdomen is soft. There is no mass.     Tenderness: There is no abdominal tenderness.  Musculoskeletal:        General: No tenderness. Normal range of motion.     Cervical back: Normal range of motion and neck supple.     Right lower leg: Edema present.     Left lower leg: Edema present.  Lymphadenopathy:     Cervical: No cervical adenopathy.  Skin:    General: Skin is warm and dry.     Findings: No erythema or rash.  Neurological:     Mental Status: He is alert.     Coordination: Coordination normal.  Psychiatric:        Behavior: Behavior normal.    ED Results / Procedures / Treatments   Labs (all labs ordered are listed, but only abnormal results are displayed) Labs Reviewed  COMPREHENSIVE METABOLIC PANEL - Abnormal; Notable for the following components:      Result Value   Potassium 3.4 (*)    Glucose, Bld 244 (*)    Calcium 8.8 (*)     All other components within normal limits  CBC - Abnormal; Notable for the following components:   Platelets 126 (*)    All other components within normal limits  URINALYSIS, ROUTINE W REFLEX MICROSCOPIC - Abnormal; Notable for the following components:   Color, Urine STRAW (*)  Glucose, UA >=500 (*)    All other components within normal limits  RESP PANEL BY RT-PCR (FLU A&B, COVID) ARPGX2  APTT  PROTIME-INR  MAGNESIUM  BRAIN NATRIURETIC PEPTIDE  LIPASE, BLOOD  CBG MONITORING, ED  TROPONIN I (HIGH SENSITIVITY)  TROPONIN I (HIGH SENSITIVITY)    EKG EKG Interpretation  Date/Time:  Sunday September 17 2021 15:11:45 EST Ventricular Rate:  69 PR Interval:  164 QRS Duration: 98 QT Interval:  406 QTC Calculation: 435 R Axis:   21 Text Interpretation: Normal sinus rhythm Normal ECG When compared with ECG of 16-Dec-2017 11:48, No significant change was found Confirmed by Noemi Chapel [phone]) on 09/17/2021 3:17:23 PM  Radiology DG Chest Portable 1 View  Result Date: 09/17/2021 CLINICAL DATA:  Chest tightness and shortness of breath. EXAM: PORTABLE CHEST 1 VIEW COMPARISON:  Chest radiograph 06/03/2020. FINDINGS: Monitoring leads overlie the patient. Stable cardiomegaly. No large area pulmonary consolidation. No pleural effusion or pneumothorax. Thoracic spine degenerative changes. IMPRESSION: No acute cardiopulmonary process.  Cardiomegaly. Electronically Signed   By: Lovey Newcomer M.D.   On: 09/17/2021 15:28    Procedures Procedures    Medications Ordered in ED Medications  aspirin chewable tablet 324 mg (324 mg Oral Not Given 09/17/21 1555)  0.9 %  sodium chloride infusion ( Intravenous Infusion Verify 09/17/21 1908)  nitroGLYCERIN (NITROSTAT) SL tablet 0.4 mg (has no administration in time range)  furosemide (LASIX) injection 40 mg (40 mg Intravenous Given 09/17/21 1602)    ED Course/ Medical Decision Making/ A&P                           Medical Decision Making Amount and/or  Complexity of Data Reviewed Labs: ordered. Radiology: ordered.  Risk OTC drugs. Prescription drug management.   This patient presents to the ED for concern of chest pain and shortness of breath, this involves an extensive number of treatment options, and is a complaint that carries with it a high risk of complications and morbidity.  The differential diagnosis includes congestive heart failure, pulmonary edema, pulmonary embolism though this seems less likely given that he is not hypoxic nor tachycardic.  Would consider coronary disease, unstable angina or possibly non-ST elevation MI.   Co morbidities that complicate the patient evaluation  Diabetes, hypertension, prior coronary disease, obesity   Additional history obtained:  Additional history obtained from electronic medical record External records from outside source obtained and reviewed including prior office visits to cardiology and admissions to the hospital, please see history of present illness   Lab Tests:  I Ordered, and personally interpreted labs.  The pertinent results include: Negative troponin, negative COVID and flu, urinalysis is clean, metabolic panel shows some hyperglycemia but no DKA, CBC without any acute findings, INR is 1.1 magnesium is normal, lipase is normal, BNP is normal.   Imaging Studies ordered:  I ordered imaging studies including portable chest x-ray I independently visualized and interpreted imaging which showed no acute findings I agree with the radiologist interpretation   Cardiac Monitoring:  The patient was maintained on a cardiac monitor.  I personally viewed and interpreted the cardiac monitored which showed an underlying rhythm of: Normal sinus rhythm, comparing this to the EKG, the EKG is totally normal without any ischemia or ST elevation or arrhythmia   Medicines ordered and prescription drug management:  I ordered medication including nitroglycerin for chest pain Reevaluation  of the patient after these medicines showed that the patient improved I  have reviewed the patients home medicines and have made adjustments as needed   Test Considered:  CT scan of the chest, also considered admission to the hospital   Critical Interventions:  Evaluation for causes of chest pain shortness of breath on exertion   Consultations Obtained:  I requested consultation with the cardiology,  and discussed lab and imaging findings as well as pertinent plan - they recommend: They did not answer my phone call, patient for over 1 hour, ultimately the patient became agitated and wanted to go home   Problem List / ED Course:  Chest pain, no clear etiology found, multiple troponins are normal, the patient's symptoms improved   Reevaluation:  After the interventions noted above, I reevaluated the patient and found that they have :improved   Social Determinants of Health:  Heart disease, leaving without consultation by cardiology at the patient's insistence.  He refuses to stay any longer.  I had a discussion with the patient regarding leaving against my advice but he states that he will come back if he gets worse will call the cardiologist in the morning, he again over and over states that he has too much to do at home to many animals to take care of and refuses to stay any longer.  He does agree to come back should things worsen   Dispostion:  After consideration of the diagnostic results and the patients response to treatment, I feel that the patent would benefit from admission - but he refuses.          Final Clinical Impression(s) / ED Diagnoses Final diagnoses:  Chest heaviness  DOE (dyspnea on exertion)    Rx / DC Orders ED Discharge Orders     None         Noemi Chapel, MD 09/17/21 1945

## 2021-09-17 NOTE — ED Notes (Signed)
Cardiology repaged to Dr Sabra Heck

## 2021-10-10 ENCOUNTER — Ambulatory Visit: Payer: Medicare Other | Admitting: Nutrition

## 2021-10-18 ENCOUNTER — Encounter (INDEPENDENT_AMBULATORY_CARE_PROVIDER_SITE_OTHER): Payer: Self-pay | Admitting: *Deleted

## 2021-10-27 ENCOUNTER — Ambulatory Visit: Payer: Medicare Other | Admitting: Physician Assistant

## 2021-11-07 ENCOUNTER — Ambulatory Visit: Payer: Medicare Other | Admitting: Nutrition

## 2021-11-29 ENCOUNTER — Encounter: Payer: Medicare Other | Attending: Internal Medicine | Admitting: Nutrition

## 2021-11-29 VITALS — Ht 74.0 in | Wt 290.0 lb

## 2021-11-29 DIAGNOSIS — Z6841 Body Mass Index (BMI) 40.0 and over, adult: Secondary | ICD-10-CM

## 2021-11-29 DIAGNOSIS — Z713 Dietary counseling and surveillance: Secondary | ICD-10-CM | POA: Insufficient documentation

## 2021-11-29 DIAGNOSIS — E118 Type 2 diabetes mellitus with unspecified complications: Secondary | ICD-10-CM

## 2021-11-29 DIAGNOSIS — E119 Type 2 diabetes mellitus without complications: Secondary | ICD-10-CM | POA: Insufficient documentation

## 2021-11-29 DIAGNOSIS — I10 Essential (primary) hypertension: Secondary | ICD-10-CM

## 2021-11-29 DIAGNOSIS — E669 Obesity, unspecified: Secondary | ICD-10-CM | POA: Insufficient documentation

## 2021-11-29 DIAGNOSIS — E782 Mixed hyperlipidemia: Secondary | ICD-10-CM

## 2021-11-29 DIAGNOSIS — K7460 Unspecified cirrhosis of liver: Secondary | ICD-10-CM

## 2021-11-29 NOTE — Patient Instructions (Addendum)
Goals ? ?Eat three meals per day ?Eat 45-60 g CHO at meals ?Don't skip meals ?Cut out processed foods ?Choose more plant based whole foods for meals ?Take 20 units of Lantus at night. ?Test blood sugars twice a day ?Lose 1 lb per week. ?Get A1C down to 7% ? ?

## 2021-11-29 NOTE — Progress Notes (Signed)
Medical Nutrition Therapy  ?Appointment Start time:  0800  Appointment End time:  0900 ? ?Primary concerns today: DM Type 2, Obesity and Fatty LIver  ?Referral diagnosis: E11.8, K76, e66.9 ?Preferred learning style: See ?Learning readiness: Ready, change in progress ? ?NUTRITION ASSESSMENT  ?Type 2 DM with Fatty Liver now. Wants to lose weight and improve his DM and get off medications. ?Use to weigh 330 lbs in Dec 2022. Has lost about 30 lbs.  ?Changes: drinking water. Cut out sodas, tea. ?Eats 3 meals per day. 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  ? ?FBS 150-170's,  Testing once a day. ? ?Current diet is inadequate to meet his nutritional need for desired weight loss and improved blood sugars. ? ?He is willing to change over to lifestyle medicine to help him reverse his DM, Obesity and Fatty liver. ? ?Anthropometrics  ?Wt Readings from Last 3 Encounters:  ?11/29/21 290 lb (131.5 kg)  ?09/17/21 (!) 311 lb (141.1 kg)  ?08/22/21 (!) 306 lb 14.4 oz (139.2 kg)  ? ?Ht Readings from Last 3 Encounters:  ?11/29/21 6' 2"  (1.88 m)  ?09/17/21 6' 2"  (1.88 m)  ?08/22/21 6' 2"  (1.88 m)  ? ?Body mass index is 37.23 kg/m?. ?@BMIFA @ ?Facility age limit for growth percentiles is 20 years. ?Facility age limit for growth percentiles is 20 years. ?  ? ?Clinical ?Medical Hx: DM amd Fatty Liver ?Medications: Latnus 45 units BID ?Labs:  ?Lab Results  ?Component Value Date  ? HGBA1C 9.4 (H) 10/26/2019  ? ?A1C 7.7% 08/2021 ?Notable Signs/Symptoms: Increased thirsty, fatigue, ? ?Lifestyle & Dietary Hx ?LIve with wife and retired. Disability with back and leg. ? ? 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 ?24-Hr Dietary Recall ?First Meal: Ham, eggs and toast-wheat bread, coffee-sweet n low and water ?Snack:  ?Second Meal: 2 bologna sandwiches, water ?Snack:  ?Third Meal: egg bacon and egg sandwiches, ?Snack: yogurt and apple,  water ?Beverages: water ? ?Estimated Energy Needs ?Calories: 1600 ?Carbohydrate: 180 g ?Protein: 120 g ?Fat: 40 g ? ? ?NUTRITION DIAGNOSIS  ?NB-1.1 Food and nutrition-related knowledge deficit As related to Diabetes and Fatty Liver.  As evidenced by A1C 9.4% and Elevated liver enzymes.. ? ? ?NUTRITION INTERVENTION  ?Nutrition education (E-1) on the following topics:  ?Nutrition and Diabetes education provided on My Plate, CHO counting, meal planning, portion sizes, timing of meals, avoiding snacks between meals unless having a low blood sugar, target ranges for A1C and blood sugars, signs/symptoms and treatment of hyper/hypoglycemia, monitoring blood sugars, taking medications as prescribed, benefits of exercising 30 minutes per day and prevention of complications of DM. ?Lifestyle Medicine ?- Whole Food, Plant Predominant Nutrition is highly recommended: Eat Plenty of vegetables, Mushrooms, fruits, Legumes, Whole Grains, Nuts, seeds in lieu of processed meats, processed snacks/pastries red meat, poultry, eggs.  ?  ?-It is better to avoid simple carbohydrates including: Cakes, Sweet Desserts, Ice Cream, Soda (diet and regular), Sweet Tea, Candies, Chips, Cookies, Store Bought Juices, Alcohol in Excess of  1-2 drinks a day, Lemonade,  Artificial Sweeteners, Doughnuts, Coffee Creamers, "Sugar-free" Products, etc, etc.  This is not a complete list..... ? ?Exercise: If you are able: 30 -60 minutes a day ,4 days a week, or 150 minutes a week.  The longer the better.  Combine stretch, strength, and aerobic activities.  If you were told in the past that you have high risk for cardiovascular diseases, you  may seek evaluation by your heart doctor prior to initiating moderate to intense exercise programs. ? ?Handouts Provided Include  ?LIfestyle Medicine ?Nutrition lifestyle ?Calorie density of foods ?Meal Plan Card ?[email or web address] ? ?Learning Style & Readiness for Change ?Teaching method utilized: Visual & Auditory   ?Demonstrated degree of understanding via: Teach Back  ?Barriers to learning/adherence to lifestyle change: none  ? ?Goals Established by Pt ?Goals ? ?Eat three meals per day ?Eat 45-60 g CHO at meals ?Don't skip meals ?Cut out processed foods ?Choose more plant based whole foods for meals ?Take 20 units of Lantus at night. ?Test blood sugars twice a day ?Lose 1 lb per week. ?Get A1C down to 7% ? ? ?MONITORING & EVALUATION ?Dietary intake, weekly physical activity, and weight and blood sugars in 1 month. ? ?Next Steps  ?Patient is to work on meal planning and eating more whole plant based foods.. ? ?

## 2021-12-06 ENCOUNTER — Telehealth (INDEPENDENT_AMBULATORY_CARE_PROVIDER_SITE_OTHER): Payer: Self-pay

## 2021-12-06 ENCOUNTER — Encounter (INDEPENDENT_AMBULATORY_CARE_PROVIDER_SITE_OTHER): Payer: Self-pay

## 2021-12-06 ENCOUNTER — Other Ambulatory Visit (INDEPENDENT_AMBULATORY_CARE_PROVIDER_SITE_OTHER): Payer: Self-pay

## 2021-12-06 DIAGNOSIS — I8500 Esophageal varices without bleeding: Secondary | ICD-10-CM

## 2021-12-06 DIAGNOSIS — K7460 Unspecified cirrhosis of liver: Secondary | ICD-10-CM

## 2021-12-06 NOTE — Telephone Encounter (Signed)
Referring MD/PCP: Manuella Ghazi ? ?Procedure: Egd  ? ?Reason/Indication:  Cirrhosis esophageal varices ? ?Has patient had this procedure before?  yes ? If so, when, by whom and where? 2019  ? ?Is there a family history of colon cancer?  no ? Who?  What age when diagnosed?   ? ?Is patient diabetic? If yes, Type 1 or Type 2   yes, type 2 ?     ?Does patient have prosthetic heart valve or mechanical valve?  no ? ?Do you have a pacemaker/defibrillator?  no ? ?Has patient ever had endocarditis/atrial fibrillation? no ? ?Does patient use oxygen? no ? ?Has patient had joint replacement within last 12 months?  no ? ?Is patient constipated or do they take laxatives? no ? ?Does patient have a history of alcohol/drug use?  no ? ?Have you had a stroke/heart attack last 6 mths? no ? ?Do you take medicine for weight loss?  no ? ?For male patients,: have you had a hysterectomy N/A ?                     are you post menopausal N/A ?                     do you still have your menstrual cycle N/A ? ?Is patient on blood thinner such as Coumadin, Plavix and/or Aspirin? yes ? ?Medications: amlodipine 10 mg daily, asa 81 mg daily, dicyclomine 10 mg, ibuprofen 200 mg, lantus 45 units at bedtime, lisinopril 40 mg daily, metoprolol 50 mg bid, MVI daily, nitrostat 0.4 mg prn, pantoprazole 40 mg bid, zinc 50 mg daily ? ?Allergies: nkda  ? ?Medication Adjustment per Dr Jenetta Downer: none ? ?Procedure date & time: 12/15/21 at 730 am  ?  ?

## 2021-12-06 NOTE — Telephone Encounter (Signed)
Ok to schedule. ? ?Thanks, ? ?Harvel Quale, MD ?Gastroenterology and Hepatology ?[facility] ? ?

## 2021-12-06 NOTE — Telephone Encounter (Signed)
Saralynn Langhorst Ann Julianna Vanwagner, CMA  ?

## 2021-12-12 ENCOUNTER — Encounter (HOSPITAL_COMMUNITY)
Admission: RE | Admit: 2021-12-12 | Discharge: 2021-12-12 | Disposition: A | Discharge status: Home / Self Care | Payer: Medicare Other | Source: Ambulatory Visit | Attending: Gastroenterology | Admitting: Gastroenterology

## 2021-12-12 ENCOUNTER — Encounter (HOSPITAL_COMMUNITY): Payer: Self-pay

## 2021-12-12 ENCOUNTER — Other Ambulatory Visit: Payer: Self-pay

## 2021-12-12 NOTE — Progress Notes (Signed)
?   12/12/21 1025  ?OBSTRUCTIVE SLEEP APNEA  ?Have you ever been diagnosed with sleep apnea through a sleep study? No  ?Do you snore loudly (loud enough to be heard through closed doors)?  0  ?Do you often feel tired, fatigued, or sleepy during the daytime (such as falling asleep during driving or talking to someone)? 0  ?Has anyone observed you stop breathing during your sleep? 0  ?Do you have, or are you being treated for high blood pressure? 1  ?BMI more than 35 kg/m2? 1  ?Age > 26 (1-yes) 1  ?Neck circumference greater than:Male 16 inches or larger, Male 17inches or larger? 0  ?Male Gender (Yes=1) 1  ?Obstructive Sleep Apnea Score 4  ?Score 5 or greater  Results sent to PCP  ? ? ?

## 2021-12-15 ENCOUNTER — Encounter (HOSPITAL_COMMUNITY): Admission: RE | Payer: Self-pay | Source: Home / Self Care

## 2021-12-15 ENCOUNTER — Ambulatory Visit (HOSPITAL_COMMUNITY): Admission: RE | Admit: 2021-12-15 | Payer: Medicare Other | Source: Home / Self Care | Admitting: Gastroenterology

## 2021-12-15 SURGERY — ESOPHAGOGASTRODUODENOSCOPY (EGD) WITH PROPOFOL
Anesthesia: Monitor Anesthesia Care

## 2021-12-21 ENCOUNTER — Encounter: Payer: Self-pay | Admitting: Nutrition

## 2022-01-01 ENCOUNTER — Encounter: Payer: Medicare Other | Attending: Internal Medicine | Admitting: Nutrition

## 2022-01-01 DIAGNOSIS — E782 Mixed hyperlipidemia: Secondary | ICD-10-CM | POA: Insufficient documentation

## 2022-01-01 DIAGNOSIS — I10 Essential (primary) hypertension: Secondary | ICD-10-CM | POA: Diagnosis present

## 2022-01-01 DIAGNOSIS — K7460 Unspecified cirrhosis of liver: Secondary | ICD-10-CM | POA: Insufficient documentation

## 2022-01-01 DIAGNOSIS — Z6841 Body Mass Index (BMI) 40.0 and over, adult: Secondary | ICD-10-CM | POA: Diagnosis present

## 2022-01-01 DIAGNOSIS — E118 Type 2 diabetes mellitus with unspecified complications: Secondary | ICD-10-CM | POA: Insufficient documentation

## 2022-01-01 NOTE — Progress Notes (Signed)
Medical Nutrition Therapy  DM Follow up  Appointment Start time:  0900  Appointment End time:  0930  Primary concerns today: DM Type 2, Obesity and Fatty LIver  Referral diagnosis: E11.8, K76, e66.9 Preferred learning style: See Learning readiness: Ready, change in progress  NUTRITION ASSESSMENT   Type 2 DM with Fatty Liver now.  [id number] in am and Bedtime 170-200's. He notes he eats breakfast and dinner and doesn't eat lunch. Sometimes forgets his insulin. Frustrated that his numbers aren't better. Stays hungry. Snacks at night.  A1C up to 8.2%. PCP Dr. Manuella Ghazi. Trying to eat more beans and vegetables, but eats a lot of processed meatts. Willing to work on lifestyle medicine and more plant based foods. .  Changes: drinking water. Cut out sodas, tea.   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   FBS 150-170's,  Testing once a day.  Current diet is inadequate to meet his nutritional need for desired weight loss and improved blood sugars.  He is willing to change over to lifestyle medicine to help him reverse his DM, Obesity and Fatty liver.  Anthropometrics  Wt Readings from Last 3 Encounters:  12/12/21 290 lb (131.5 kg)  11/29/21 290 lb (131.5 kg)  09/17/21 (!) 311 lb (141.1 kg)   Ht Readings from Last 3 Encounters:  12/12/21 6' 2"  (1.88 m)  11/29/21 6' 2"  (1.88 m)  09/17/21 6' 2"  (1.88 m)   There is no height or weight on file to calculate BMI. @BMIFA @ Facility age limit for growth percentiles is 20 years. Facility age limit for growth percentiles is 20 years.    Clinical Medical Hx: DM amd Fatty Liver Medications: Lantus 45 units one a day.  Labs:  Lab Results  Component Value Date   HGBA1C 9.4 (H) 10/26/2019   A1C 7.7% 08/2021 Notable Signs/Symptoms: Increased thirsty, fatigue,  Lifestyle & Dietary Hx LIve with wife and retired. Disability with back and leg.  Estimated daily fluid intake: 128  oz Supplements: MVI Sleep: 8 Stress / self-care: no Current  average weekly physical activity: Works on farm  24-Hr Dietary Recall First Meal: Ham, eggs and toast-wheat bread, coffee-sweet n low and water Snack:  Second Meal: 2 bologna sandwiches, water Snack:  Third Meal: egg bacon and egg sandwiches, Snack: yogurt and apple, water Beverages: water  Estimated Energy Needs Calories: 1600 Carbohydrate: 180 g Protein: 120 g Fat: 40 g   NUTRITION DIAGNOSIS  NB-1.1 Food and nutrition-related knowledge deficit As related to Diabetes and Fatty Liver.  As evidenced by A1C 9.4% and Elevated liver enzymes.Marland Kitchen   NUTRITION INTERVENTION  Nutrition education (E-1) on the following topics:  Nutrition and Diabetes education provided on My Plate, CHO counting, meal planning, portion sizes, timing of meals, avoiding snacks between meals unless having a low blood sugar, target ranges for A1C and blood sugars, signs/symptoms and treatment of hyper/hypoglycemia, monitoring blood sugars, taking medications as prescribed, benefits of exercising 30 minutes per day and prevention of complications of DM. Lifestyle Medicine - Whole Food, Plant Predominant Nutrition is highly recommended: Eat Plenty of vegetables, Mushrooms, fruits, Legumes, Whole Grains, Nuts, seeds in lieu of processed meats, processed snacks/pastries red meat, poultry, eggs.    -It is better to avoid simple carbohydrates including: Cakes, Sweet Desserts, Ice Cream, Soda (diet and regular), Sweet Tea, Candies, Chips, Cookies, Store Bought Juices, Alcohol in Excess of  1-2 drinks a day, Lemonade,  Artificial Sweeteners, Doughnuts, Coffee Creamers, "Sugar-free" Products, etc, etc.  This is not a complete list...Marland KitchenMarland Kitchen  Exercise: If you are able: 30 -60 minutes a day ,4 days a week, or 150 minutes a week.  The longer the better.  Combine stretch, strength, and aerobic activities.  If you were told in the past that you have high risk for cardiovascular diseases, you may seek evaluation by your heart doctor prior  to initiating moderate to intense exercise programs.  Handouts Provided Include  LIfestyle Medicine Nutrition lifestyle Calorie density of foods Meal Plan Card [email or web address]  Learning Style & Readiness for Change Teaching method utilized: Visual & Auditory  Demonstrated degree of understanding via: Teach Back  Barriers to learning/adherence to lifestyle change: none   Goals Established by Pt Goals  Eat three meals per day Eat 45-60 g CHO at meals Don't skip meals Cut out processed foods Choose more plant based whole foods for meals Take 20 units of Lantus at night. Test blood sugars twice a day Lose 1 lb per week. Get A1C down to 7%   MONITORING & EVALUATION Dietary intake, weekly physical activity, and weight and blood sugars in 1 month.  Next Steps  Patient is to work on meal planning and eating more whole plant based foods.Marland Kitchen

## 2022-01-11 ENCOUNTER — Encounter: Payer: Medicare Other | Attending: Internal Medicine | Admitting: Nutrition

## 2022-01-11 DIAGNOSIS — E118 Type 2 diabetes mellitus with unspecified complications: Secondary | ICD-10-CM | POA: Insufficient documentation

## 2022-01-11 DIAGNOSIS — Z6841 Body Mass Index (BMI) 40.0 and over, adult: Secondary | ICD-10-CM | POA: Insufficient documentation

## 2022-01-11 DIAGNOSIS — I10 Essential (primary) hypertension: Secondary | ICD-10-CM | POA: Insufficient documentation

## 2022-01-11 DIAGNOSIS — E782 Mixed hyperlipidemia: Secondary | ICD-10-CM | POA: Insufficient documentation

## 2022-01-11 DIAGNOSIS — K7460 Unspecified cirrhosis of liver: Secondary | ICD-10-CM | POA: Insufficient documentation

## 2022-01-11 NOTE — Progress Notes (Signed)
CGM Training:  Appt start time: 1400 end time:1500.  Assessment:  Primary concerns today: Patient here for initiation of [employer] Continuous Glucose Monitoring.   Medications: See chart     Intervention:   Understanding Glucose Sensing Programming Sensor Information  High Glucose: 325 mg/dl  Low Glucose 80 mg/dl  Other settings to be added at follow up visit Starting [employer]  Entering BG, Calibration Technique and Graphs with Education officer, community and Alarms   Follow Up Patient to see MD for insulin dose adjustments and to schedule visit with me for CGM follow up within 4 weeks.    Change out every 10 days per Dexcom's recommendations. Call if any questions. Look at the blood sugars before meals or 2 hours after meal to evaluate how the medication and food intake effects blood sugars.

## 2022-01-18 ENCOUNTER — Ambulatory Visit: Payer: Medicare Other | Admitting: Physician Assistant

## 2022-01-24 ENCOUNTER — Encounter: Payer: Self-pay | Admitting: Nutrition

## 2022-01-24 NOTE — Patient Instructions (Signed)
Eat three meals per day Eat 45-60 g CHO at meals Don't skip meals Cut out processed foods Choose more plant based whole foods for meals Take 20 units of Lantus at night. Test blood sugars twice a day Lose 1 lb per week. Get A1C down to 7%

## 2022-01-25 ENCOUNTER — Encounter: Payer: Self-pay | Admitting: Nutrition

## 2022-01-25 NOTE — Patient Instructions (Signed)
Change out every 10 days per Dexcom's recommendations. Call if any questions. Look at the blood sugars before meals or 2 hours after meal to evaluate how the medication and food intake effects blood sugars.

## 2022-01-30 ENCOUNTER — Encounter (INDEPENDENT_AMBULATORY_CARE_PROVIDER_SITE_OTHER): Payer: Self-pay | Admitting: Internal Medicine

## 2022-01-30 ENCOUNTER — Ambulatory Visit (INDEPENDENT_AMBULATORY_CARE_PROVIDER_SITE_OTHER): Payer: Medicare Other | Admitting: Internal Medicine

## 2022-02-14 ENCOUNTER — Encounter (INDEPENDENT_AMBULATORY_CARE_PROVIDER_SITE_OTHER): Payer: Self-pay | Admitting: *Deleted

## 2022-03-07 ENCOUNTER — Other Ambulatory Visit (INDEPENDENT_AMBULATORY_CARE_PROVIDER_SITE_OTHER): Payer: Self-pay

## 2022-03-07 DIAGNOSIS — K7460 Unspecified cirrhosis of liver: Secondary | ICD-10-CM

## 2022-03-15 ENCOUNTER — Ambulatory Visit (HOSPITAL_COMMUNITY)
Admission: RE | Admit: 2022-03-15 | Discharge: 2022-03-15 | Disposition: A | Discharge status: Home / Self Care | Payer: Medicare Other | Source: Ambulatory Visit | Attending: Gastroenterology | Admitting: Gastroenterology

## 2022-03-15 DIAGNOSIS — K7460 Unspecified cirrhosis of liver: Secondary | ICD-10-CM | POA: Insufficient documentation

## 2022-03-26 ENCOUNTER — Ambulatory Visit (INDEPENDENT_AMBULATORY_CARE_PROVIDER_SITE_OTHER): Payer: Medicare Other | Admitting: Gastroenterology

## 2022-04-09 ENCOUNTER — Ambulatory Visit (INDEPENDENT_AMBULATORY_CARE_PROVIDER_SITE_OTHER): Payer: Medicare Other | Admitting: Gastroenterology

## 2022-04-09 ENCOUNTER — Encounter (INDEPENDENT_AMBULATORY_CARE_PROVIDER_SITE_OTHER): Payer: Self-pay | Admitting: Gastroenterology

## 2022-04-09 VITALS — BP 148/79 | HR 57 | Temp 98.3°F | Ht 74.0 in | Wt 291.4 lb

## 2022-04-09 DIAGNOSIS — K7460 Unspecified cirrhosis of liver: Secondary | ICD-10-CM

## 2022-04-09 DIAGNOSIS — I8500 Esophageal varices without bleeding: Secondary | ICD-10-CM | POA: Diagnosis not present

## 2022-04-09 NOTE — Patient Instructions (Addendum)
-   Check CBC, MELD labs and AFP - Reduce salt intake to <2 g per day - do not add salt to food and avoid processed meals. - Schedule EGD - Can take Tylenol max of 2 g per day (650 mg q8h) for pain - Avoid NSAIDs for pain - Avoid eating raw oysters/shellfish - Protein shake (Ensure or Boost) every night before going to sleep - Follow up with cardiologist

## 2022-04-09 NOTE — Progress Notes (Signed)
Billy Davis, M.D. Gastroenterology & Hepatology [facility] [address] [place], [place] [address]  Primary Care Physician: [name], [place] [address]  I will communicate my assessment and recommendations to the referring MD via EMR.  Problems: NASH cirrhosis Grade 1 EV  History of Present Illness: Billy Davis is a 66 y.o. male with PMH NASH cirrhosis, non bleeding G1 EV, CAD, DM, GERD, HLD, obesity, HTN, who presents for follow up of NASH cirrhosis.  The patient was last seen on 08/22/2021. At that time, the patient was scheduled for EGD but patient canceled the procedure as he was not sure he wanted to pursue with EGD.  Patient reports he has felt relatively well. He has noticed swelling in his legs despite taking his daily diuretics The patient and his wife report he eats food with salt frequently. He is drinking significant amount of water - 300 ounces a day per patient report as he "sweats very frequently".  The patient denies having any nausea, vomiting, fever, chills, hematochezia, melena, hematemesis, abdominal distention, abdominal pain, diarrhea, jaundice, pruritus . Has lost some weight on purpose by changing his dietary habits.  He had some urinary urgency in the past, but could not void. This cleared spontaneously.  Cirrhosis related questions: Hematemesis/coffee ground emesis: No History of variceal bleeding: No Abdominal pain: No Abdominal distention/worsening ascitesNo Fever/chills: No Episodes of confusion/disorientation: No Number of daily bowel movements:1-2 Taking diuretics?: Yes, but unsure which pill he is takign as wife states he has been told in the past to avoid spirolonactone. Prior history of banding?: No Prior episodes of SBP: No Last time liver imaging was performed:03/15/2022 - liver Doppler, no masses, normal flow MELD score: 09/17/21 - 8  Last EGD: 2019 - Normal  proximal esophagus and mid esophagus. - Grade I esophageal varices. - Z-line irregular, 43 cm from the incisors. - Portal hypertensive gastropathy. - Two gastric polyps. Resected and retrieved. Clip (MR conditional) was placed. - A single gastric polyp. Resected and retrieved. Path hyperplastic polyp with granulation tissue. - Normal duodenal bulb and second portion of the duodenum.  Last Colonoscopy: 2018 - Diverticulosis in the sigmoid colon and in the cecum. - Internal hemorrhoids.  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, thrombosed (following 03/20/07 cardiac cath)   CAD, multiple vessel    PCI circumflex 2008, diffuse disease of the right and LAD treated medically.,  //   Nuclear 2010 no ischemia   //   nuclear May, 2012 no ischemia   Cirrhosis of liver without mention of alcohol    Contrast media allergy    pt states he is not allergy to contrast   Diabetes mellitus without complication ([place])    type 2   Diverticulosis    Dysphagia    Ejection fraction    EF 55%, nuclear, May, 2012   Esophageal reflux    IBS (irritable bowel syndrome)    Lower abdominal pain    Mixed dyslipidemia    Morbid obesity ([place])    Other chronic nonalcoholic liver disease    Postsurgical percutaneous transluminal coronary angioplasty status    Preop cardiovascular exam    Cardiovascular clearance for prostate surgery October, 2013   Prostate cancer ([place]) 03/24/12   Unspecified diastolic heart failure    Unspecified essential hypertension     Past Surgical History: Past Surgical History:  Procedure Laterality Date  BACK SURGERY     took cyst out of back   CARDIAC CATHETERIZATION     CARPAL TUNNEL RELEASE Bilateral    CHOLECYSTECTOMY  06/1998   COLONOSCOPY  03/03/03   COLONOSCOPY  08/17/2011   Procedure: COLONOSCOPY;  Surgeon: Rogene Houston, MD;  Location: [facility];  Service: Endoscopy;  Laterality: N/A;  1045    COLONOSCOPY N/A 07/05/2017   Procedure: COLONOSCOPY;  Surgeon: Rogene Houston, MD;  Location: [facility];  Service: Endoscopy;  Laterality: N/A;  2:00   ESOPHAGOGASTRODUODENOSCOPY (EGD) WITH PROPOFOL N/A 12/20/2017   Procedure: ESOPHAGOGASTRODUODENOSCOPY (EGD) WITH PROPOFOL;  Surgeon: Rogene Houston, MD;  [facility]: [facility];  Service: Endoscopy;  Laterality: N/A;  11:10 :   LEFT HEART CATHETERIZATION WITH CORONARY ANGIOGRAM N/A 12/07/2013   Procedure: LEFT HEART CATHETERIZATION WITH CORONARY ANGIOGRAM;  Surgeon: Peter M Martinique, MD;  Location: [facility] CATH LAB;  Service: Cardiovascular;  Laterality: N/A;   left rotator     POLYPECTOMY  12/20/2017   Procedure: POLYPECTOMY;  Surgeon: Rogene Houston, MD;  Location: [facility];  Service: Endoscopy;;  Gastric (HS) x2   RIGHT KNEE SURGERY     ROBOT ASSISTED LAPAROSCOPIC RADICAL PROSTATECTOMY  06/26/2012   Procedure: ROBOTIC ASSISTED LAPAROSCOPIC RADICAL PROSTATECTOMY LEVEL 3;  Surgeon: Dutch Gray, MD;  Location: [facility];  Service: Urology;  Laterality: N/A;       Two cardiac stents  2009   UPPER GASTROINTESTINAL ENDOSCOPY  02/21/2011   EGD ED   UPPER GASTROINTESTINAL ENDOSCOPY  03/03/03   TCS   VASECTOMY      Family History: Family History  Problem Relation Age of Onset   Heart disease Mother    Breast cancer Mother    Heart disease Father    Breast cancer Sister    Ovarian cancer Sister    Heart disease Brother    Healthy Daughter    Healthy Daughter    Healthy Daughter    Healthy Daughter    Coronary artery disease Other        family history    Social History: Social History   Tobacco Use  Smoking Status Former   Packs/day: 3.00   Years: 30.00   Total pack years: 90.00   Types: Cigarettes   Quit date: 08/27/1998   Years since quitting: 23.6   Passive exposure: Past  Smokeless Tobacco Never  Tobacco Comments   Pt quit smoking 10 yrs ago, smoked for about 30 yrs.   Social History   Substance and Sexual  Activity  Alcohol Use No   Alcohol/week: 0.0 standard drinks of alcohol   Social History   Substance and Sexual Activity  Drug Use No    Allergies: Allergies  Allergen Reactions   Codeine Palpitations   Plavix [Clopidogrel Bisulfate] Rash    Medications: Current Outpatient Medications  Medication Sig Dispense Refill   amLODipine (NORVASC) 10 MG tablet Take 10 mg by mouth daily.      aspirin EC 81 MG tablet Take 1 tablet (81 mg total) by mouth daily.     ibuprofen (ADVIL,MOTRIN) 200 MG tablet Take 400 mg by mouth every 8 (eight) hours as needed for mild pain or moderate pain.     insulin glargine (LANTUS) 100 UNIT/ML injection Inject into the skin at bedtime. 50 qam and 45 units qhs     lisinopril (PRINIVIL,ZESTRIL) 40 MG tablet Take 40 mg by mouth every morning.      metoprolol tartrate (LOPRESSOR) 50 MG tablet  Take 1 tablet (50 mg total) by mouth 2 (two) times daily. 180 tablet 1   Multiple Vitamin (MULTI VITAMIN MENS PO) Take 1 tablet by mouth daily.      NITROSTAT 0.4 MG SL tablet Place 1 tablet under the tongue every 5 (five) minutes as needed for chest pain. For chest pains     pantoprazole (PROTONIX) 40 MG tablet Take 40 mg by mouth daily.     spironolactone (ALDACTONE) 25 MG tablet Take 25 mg by mouth daily.     zinc gluconate 50 MG tablet Take 50 mg by mouth daily.     dicyclomine (BENTYL) 10 MG capsule TAKE 1 CAPSULE BY MOUTH EVERY 12 HOURS AS NEEDED FOR  SPASMS  (ABDOMINAL  PAIN) (Patient not taking: Reported on 04/09/2022) 90 capsule 2   Simethicone (PHAZYME MAXIMUM STRENGTH) 250 MG CAPS Take 250 mg by mouth 2 (two) times daily as needed. (Patient not taking: Reported on 04/09/2022) 60 capsule    No current facility-administered medications for this visit.    Review of Systems: GENERAL: negative for malaise, night sweats HEENT: No changes in hearing or vision, no nose bleeds or other nasal problems. NECK: Negative for lumps, goiter, pain and significant neck  swelling RESPIRATORY: Negative for cough, wheezing CARDIOVASCULAR: Negative for chest pain, leg swelling, palpitations, orthopnea GI: SEE HPI MUSCULOSKELETAL: Negative for joint pain or swelling, back pain, and muscle pain. SKIN: Negative for lesions, rash PSYCH: Negative for sleep disturbance, mood disorder and recent psychosocial stressors. HEMATOLOGY Negative for prolonged bleeding, bruising easily, and swollen nodes. ENDOCRINE: Negative for cold or heat intolerance, polyuria, polydipsia and goiter. NEURO: negative for tremor, gait imbalance, syncope and seizures. The remainder of the review of systems is noncontributory.   Physical Exam: BP (!) 148/79 (BP Location: Left Arm, Patient Position: Sitting, Cuff Size: Large)   Pulse (!) 57   Temp 98.3 F (36.8 C) (Oral)   Ht 6' 2"  (1.88 m)   Wt 291 lb 6.4 oz (132.2 kg)   BMI 37.41 kg/m  GENERAL: The patient is AO x3, in no acute distress. HEENT: Head is normocephalic and atraumatic. EOMI are intact. Mouth is well hydrated and without lesions. NECK: Supple. No masses LUNGS: Clear to auscultation. No presence of rhonchi/wheezing/rales. Adequate chest expansion HEART: RRR, normal s1 and s2. ABDOMEN: Soft, nontender, no guarding, no peritoneal signs, and nondistended. BS +. No masses. EXTREMITIES: Without any cyanosis, clubbing, rash, lesions. Has +2 pitting edema bilaterally extending to knees. NEUROLOGIC: AOx3, no focal motor deficit. SKIN: no jaundice, no rashes  Imaging/Labs: as above  I personally reviewed and interpreted the available labs, imaging and endoscopic files.  Impression and Plan: JEMELL TOWN is a 66 y.o. male with PMH NASH cirrhosis, non bleeding G1 EV, CAD, DM, GERD, HLD, obesity, HTN, who presents for follow up of NASH cirrhosis. Patient has been asymptomatic and has not presented any decompensating event. He has presented some worsening lower extremity edema, which may be related to his increased salt and water  intake. I advised the patient to decrease the amount of water he drinks on a daily basis (should decrease to at least 2 L per day) and avoid adding salt to his meals. Will benefit of following with his cardiologist to optimize his diuretic regimen as well. No signs of encephalopathy or ascites.   He had grade 1 EV during last EGD, he is overdue for this. He is agreeable to proceed with EGD but is not interested in banding, may consider BB  if worsening size of varices.  We will obtain repeat MELD and AFP labs today.  - Check CBC, MELD labs and AFP - Reduce salt intake to <2 g per day - do not add salt to food and avoid processed meals. - Schedule EGD - Can take Tylenol max of 2 g per day (650 mg q8h) for pain - Avoid NSAIDs for pain - Avoid eating raw oysters/shellfish - Protein shake (Ensure or Boost) every night before going to sleep - Follow up with cardiologist  All questions were answered.      Harvel Quale, MD Gastroenterology and Hepatology [facility]

## 2022-04-10 LAB — CBC WITH DIFFERENTIAL/PLATELET
Basophils Absolute: 0.1 10*3/uL (ref 0.0–0.2)
Basos: 1 %
EOS (ABSOLUTE): 0.2 10*3/uL (ref 0.0–0.4)
Eos: 3 %
Hematocrit: 43.6 % (ref 37.5–51.0)
Hemoglobin: 14.7 g/dL (ref 13.0–17.7)
Immature Grans (Abs): 0 10*3/uL (ref 0.0–0.1)
Immature Granulocytes: 0 %
Lymphocytes Absolute: 1.4 10*3/uL (ref 0.7–3.1)
Lymphs: 25 %
MCH: 28.1 pg (ref 26.6–33.0)
MCHC: 33.7 g/dL (ref 31.5–35.7)
MCV: 83 fL (ref 79–97)
Monocytes Absolute: 0.4 10*3/uL (ref 0.1–0.9)
Monocytes: 7 %
Neutrophils Absolute: 3.6 10*3/uL (ref 1.4–7.0)
Neutrophils: 64 %
Platelets: 135 10*3/uL — ABNORMAL LOW (ref 150–450)
RBC: 5.23 x10E6/uL (ref 4.14–5.80)
RDW: 14.1 % (ref 11.6–15.4)
WBC: 5.6 10*3/uL (ref 3.4–10.8)

## 2022-04-10 LAB — COMPREHENSIVE METABOLIC PANEL
ALT: 28 IU/L (ref 0–44)
AST: 26 IU/L (ref 0–40)
Albumin/Globulin Ratio: 1.9 (ref 1.2–2.2)
Albumin: 4.5 g/dL (ref 3.9–4.9)
Alkaline Phosphatase: 84 IU/L (ref 44–121)
BUN/Creatinine Ratio: 17 (ref 10–24)
BUN: 14 mg/dL (ref 8–27)
Bilirubin Total: 0.5 mg/dL (ref 0.0–1.2)
CO2: 21 mmol/L (ref 20–29)
Calcium: 9.2 mg/dL (ref 8.6–10.2)
Chloride: 104 mmol/L (ref 96–106)
Creatinine, Ser: 0.83 mg/dL (ref 0.76–1.27)
Globulin, Total: 2.4 g/dL (ref 1.5–4.5)
Glucose: 224 mg/dL — ABNORMAL HIGH (ref 70–99)
Potassium: 4 mmol/L (ref 3.5–5.2)
Sodium: 141 mmol/L (ref 134–144)
Total Protein: 6.9 g/dL (ref 6.0–8.5)
eGFR: 97 mL/min/{1.73_m2} (ref >59)

## 2022-04-10 LAB — PROTIME-INR
INR: 1.1 (ref 0.9–1.2)
Prothrombin Time: 11.7 s (ref 9.1–12.0)

## 2022-04-10 LAB — AFP TUMOR MARKER: AFP, Serum, Tumor Marker: 1.9 ng/mL (ref 0.0–8.4)

## 2022-04-10 NOTE — H&P (View-Only) (Signed)
Cardiology Office Note    Date:  04/17/2022   ID:  Billy Davis, MRN [id number]   PCP:  Billy Davis  Cardiologist:  Billy Dolly, MD   Advanced Practice Provider:  No care team member to display Electrophysiologist:  None   [phone]   No chief complaint on file.   History of Present Illness:  Billy Davis is a 66 y.o. male with history of NASH cirrhosis, HTN, HLD  CAD - prior BMS to LCX in 2008, with noted moderate diffuse disease of LAD and RCA that has been treated medically. LVEF by LV gram 60% at that time  - Lexiscan MPI 02/2013 with multiple defects and normal wall motion thought most likely artifact. LVEF 50%.  - cath 12/07/2013 LM patent, LAD 30% prox, LCX patent, OM1 with patent stent, RCA 30% prox disease, LVEF 55-65% by LV gram  [facility] R on 04/25/19 with chest pain occurring at rest over the prior week.  - 04/2019 nuclear Stress [facility]: moderate reversibility inferoapical and inferior wall.  - antianginals were titrated and symptoms resolved. He did not want to pursue cath.  03/2019 echo [facility]: LVEF 60-65%   Patient last saw Dr. Harl Davis 2021 and told he could take lasix if needed for edmea and GI ok'd him to take a statin. Last echo 11/2020 normal LVEF with G1DD.   Patient comes in with his wife. When he feeds his chickens in am he can't make it back up the hill without having to stop to get his breath. He's having an aching chest pain lasting 10 min and occurs with or without activity. Took ASA for it last week and it quit. Hasn't tried NTG because of HA. No chest pressure or heaviness. Very worried about needing more stents because he had 3 friends died on the cath table of MI's. Was in ED in Jan with chest pain and leg swelling and says he was filled with fluid. He stopped drinking tea and soda. Cut back on salt. Has lost 50 lbs.      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, thrombosed (following 03/20/07 cardiac cath)   CAD, multiple vessel    PCI circumflex 2008, diffuse disease of the right and LAD treated medically.,  //   Nuclear 2010 no ischemia   //   nuclear May, 2012 no ischemia   Cirrhosis of liver without mention of alcohol    Contrast media allergy    pt states he is not allergy to contrast   Diabetes mellitus without complication ([place])    type 2   Diverticulosis    Dysphagia    Ejection fraction    EF 55%, nuclear, May, 2012   Esophageal reflux    IBS (irritable bowel syndrome)    Lower abdominal pain    Mixed dyslipidemia    Morbid obesity ([place])    Other chronic nonalcoholic liver disease    Postsurgical percutaneous transluminal coronary angioplasty status    Preop cardiovascular exam    Cardiovascular clearance for prostate surgery October, 2013   Prostate cancer ([place]) 03/24/12   Unspecified diastolic heart failure    Unspecified essential hypertension     Past Surgical History:  Procedure Laterality Date   BACK SURGERY     took cyst out of back   CARDIAC CATHETERIZATION     CARPAL TUNNEL RELEASE Bilateral  CHOLECYSTECTOMY  06/1998   COLONOSCOPY  03/03/03   COLONOSCOPY  08/17/2011   Procedure: COLONOSCOPY;  Surgeon: Billy Houston, MD;  [facility];  Service: Endoscopy;  Laterality: N/A;  1045   COLONOSCOPY N/A 07/05/2017   Procedure: COLONOSCOPY;  Surgeon: Billy Houston, MD;  [facility]: [facility];  Service: Endoscopy;  Laterality: N/A;  2:00   ESOPHAGOGASTRODUODENOSCOPY (EGD) WITH PROPOFOL N/A 12/20/2017   Procedure: ESOPHAGOGASTRODUODENOSCOPY (EGD) WITH PROPOFOL;  Surgeon: Billy Houston, MD;  [facility];  Service: Endoscopy;  Laterality: N/A;  11:10 :   LEFT HEART CATHETERIZATION WITH CORONARY ANGIOGRAM N/A 12/07/2013   Procedure: LEFT HEART CATHETERIZATION WITH CORONARY ANGIOGRAM;  Surgeon: Billy M Martinique, MD;  Location: [facility] CATH LAB;  Service: Cardiovascular;   Laterality: N/A;   left rotator     POLYPECTOMY  12/20/2017   Procedure: POLYPECTOMY;  Surgeon: Billy Houston, MD;  Location: [facility];  Service: Endoscopy;;  Gastric (HS) x2   RIGHT KNEE SURGERY     ROBOT ASSISTED LAPAROSCOPIC RADICAL PROSTATECTOMY  06/26/2012   Procedure: ROBOTIC ASSISTED LAPAROSCOPIC RADICAL PROSTATECTOMY LEVEL 3;  Surgeon: Billy Gray, MD;  Location: [facility];  Service: Urology;  Laterality: N/A;       Two cardiac stents  2009   UPPER GASTROINTESTINAL ENDOSCOPY  02/21/2011   EGD ED   UPPER GASTROINTESTINAL ENDOSCOPY  03/03/03   TCS   VASECTOMY      Current Medications: Current Meds  Medication Sig   amLODipine (NORVASC) 10 MG tablet Take 10 mg by mouth daily.    aspirin EC 81 MG tablet Take 1 tablet (81 mg total) by mouth daily.   ibuprofen (ADVIL,MOTRIN) 200 MG tablet Take 400 mg by mouth every 8 (eight) hours as needed for mild pain or moderate pain.   insulin glargine (LANTUS) 100 UNIT/ML injection Inject into the skin at bedtime. 50 qam and 45 units qhs   isosorbide mononitrate (IMDUR) 30 MG 24 hr tablet Take 0.5 tablets (15 mg total) by mouth daily.   lisinopril (PRINIVIL,ZESTRIL) 40 MG tablet Take 40 mg by mouth every morning.    metoprolol tartrate (LOPRESSOR) 50 MG tablet Take 1 tablet (50 mg total) by mouth 2 (two) times daily.   Multiple Vitamin (MULTI VITAMIN MENS PO) Take 1 tablet by mouth daily.    NITROSTAT 0.4 MG SL tablet Place 1 tablet under the tongue every 5 (five) minutes as needed for chest pain. For chest pains   pantoprazole (PROTONIX) 40 MG tablet Take 40 mg by mouth daily.   spironolactone (ALDACTONE) 25 MG tablet Take 25 mg by mouth daily.   zinc gluconate 50 MG tablet Take 50 mg by mouth daily.     Allergies:   Codeine and Plavix [clopidogrel bisulfate]   Social History   Socioeconomic History   Marital status: Married    Spouse name: MARTHA   Number of children: Not on file   Years of education: Not on file   Highest  education level: Not on file  Occupational History   Occupation: [place]  Tobacco Use   Smoking status: Former    Packs/day: 3.00    Years: 30.00    Total pack years: 90.00    Types: Cigarettes    Quit date: 08/27/1998    Years since quitting: 23.6    Passive exposure: Past   Smokeless tobacco: Never   Tobacco comments:    Pt quit smoking 10 yrs ago, smoked for about 30 yrs.  Vaping Use   Vaping Use: Never used  Substance and Sexual Activity   Alcohol use: No    Alcohol/week: 0.0 standard drinks of alcohol   Drug use: No   Sexual activity: Not on file  Other Topics Concern   Not on file  Social History Narrative   Lives in [place], [place]   Works at [employer] Strain: Not on [employer] Insecurity: Not on file  Transportation Needs: Not on file  Physical Activity: Not on file  Stress: Not on file  Social Connections: Not on file     Family History:  The patient's  family history includes Breast cancer in his mother and sister; Coronary artery disease in an other family member; Healthy in his daughter, daughter, daughter, and daughter; Heart disease in his brother, father, and mother; Ovarian cancer in his sister.   ROS:   Please see the history of present illness.    ROS All other systems reviewed and are negative.   PHYSICAL EXAM:   VS:  BP (!) 142/76   Pulse 62   Ht 6' 2"  (1.88 m)   Wt 296 lb (134.3 kg)   SpO2 96%   BMI 38.00 kg/m   Physical Exam  GEN:  Obese, in no acute distress   Neck: no JVD, carotid bruits, or masses Cardiac:RRR; no murmurs, rubs, or gallops  Respiratory:  clear to auscultation bilaterally, normal work of breathing GI: soft, nontender, nondistended, + BS Ext: without cyanosis, clubbing, or edema, Good distal pulses bilaterally Neuro:  Alert and Oriented x 3, Psych: euthymic mood, full affect  Wt Readings from Last 3 Encounters:  04/17/22 296 lb (134.3 kg)  04/09/22 291 lb  6.4 oz (132.2 kg)  12/12/21 290 lb (131.5 kg)      Studies/Labs Reviewed:   EKG:  EKG is not ordered today.    Recent Labs: 09/17/2021: B Natriuretic Peptide 25.0; Magnesium 2.0 04/09/2022: ALT 28; BUN 14; Creatinine, Ser 0.83; Hemoglobin 14.7; Platelets 135; Potassium 4.0; Sodium 141   Lipid Panel No results found for: "CHOL", "TRIG", "HDL", "CHOLHDL", "VLDL", "LDLCALC", "LDLDIRECT"  Additional studies/ records that were reviewed today include:  NST 04/25/22   Findings are consistent with ischemia. The study is intermediate risk.   No ST deviation was noted.   End diastolic cavity size is normal.   Mild intensity and size apical reversible defect consistent with ischemia  Echo 04/23/22 IMPRESSIONS     1. Left ventricular ejection fraction, by estimation, is 55 to 60%. The  left ventricle has normal function. The left ventricle has no regional  wall motion abnormalities. There is mild left ventricular hypertrophy.  Left ventricular diastolic parameters  are consistent with Grade I diastolic dysfunction (impaired relaxation).   2. Right ventricular systolic function is normal. The right ventricular  size is normal.   3. The mitral valve is normal in structure. No evidence of mitral valve  regurgitation. No evidence of mitral stenosis.   4. The aortic valve is tricuspid. There is mild calcification of the  aortic valve. There is mild thickening of the aortic valve. Aortic valve  regurgitation is not visualized. No aortic stenosis is present.   Comparison(s): Echocardiogram done 12/07/20 showed an EF of 60-65%.       Echo 12/07/20  IMPRESSIONS     1. Left ventricular ejection fraction, by estimation, is 60 to 65%. The  left ventricle has normal function. The left ventricle  has no regional  wall motion abnormalities. There is mild left ventricular hypertrophy.  Left ventricular diastolic parameters  are consistent with Grade I diastolic dysfunction (impaired relaxation).   The average left ventricular global longitudinal strain is 17.0 %. The  global longitudinal strain is normal.   2. Right ventricular systolic function is normal. The right ventricular  size is normal.   3. The mitral valve is normal in structure. No evidence of mitral valve  regurgitation. No evidence of mitral stenosis.   4. The aortic valve is tricuspid. There is mild calcification of the  aortic valve. There is mild thickening of the aortic valve. Aortic valve  regurgitation is not visualized. No aortic stenosis is present.   5. The inferior vena cava is normal in size with greater than 50%  respiratory variability, suggesting right atrial pressure of 3 mmHg.   Comparison(s): Nuclear stress test done May of 2012 showed an EF of 55%.  Cardiac catheterization done 12/07/13 showed an EF of 55-65%.   FINDINGS   Left Ventricle: Left ventricular ejection fraction, by estimation, is 60  to 65%. The left ventricle has normal function. The left ventricle has no  regional wall motion abnormalities. The average left ventricular global  longitudinal strain is 17.0 %. The   global longitudinal strain is normal. The left ventricular internal  cavity size was normal in size. There is mild left ventricular  hypertrophy. Left ventricular diastolic parameters are consistent with  Grade I diastolic dysfunction (impaired  relaxation). Normal left ventricular filling pressure.   Right Ventricle: The right ventricular size is normal. No increase in  right ventricular wall thickness. Right ventricular systolic function is  normal.   Left Atrium: Left atrial size was normal in size.   Right Atrium: Right atrial size was normal in size.   Pericardium: There is no evidence of pericardial effusion.   Mitral Valve: The mitral valve is normal in structure. Mild mitral annular  calcification. No evidence of mitral valve regurgitation. No evidence of  mitral valve stenosis.   Tricuspid Valve: The  tricuspid valve is normal in structure. Tricuspid  valve regurgitation is not demonstrated. No evidence of tricuspid  stenosis.   Aortic Valve: The aortic valve is tricuspid. There is mild calcification  of the aortic valve. There is mild thickening of the aortic valve. There  is mild aortic valve annular calcification. Aortic valve regurgitation is  not visualized. No aortic stenosis   is present. Aortic valve mean gradient measures 4.3 mmHg. Aortic valve  peak gradient measures 8.0 mmHg. Aortic valve area, by VTI measures 3.33  cm.   Pulmonic Valve: The pulmonic valve was not well visualized. Pulmonic valve  regurgitation is not visualized. No evidence of pulmonic stenosis.   Aorta: The aortic root is normal in size and structure.   Pulmonary Artery: Indeterminant PASP, inadequate TR jet.   Venous: The inferior vena cava is normal in size with greater than 50%  respiratory variability, suggesting right atrial pressure of 3 mmHg.   IAS/Shunts: No atrial level shunt detected by color flow Doppler.     02/2007 Cath  FINDINGS: Aortic pressure 131/85 with a mean of 106, left ventricular  pressure is 128/16.  The left mainstem has luminal irregularities but no significant  angiographic stenosis.  The left mainstem bifurcates into the LAD and left circumflex. The LAD  is a large-caliber vessel that courses down and reaches the LV apex.  Proximal portion of the LAD in the area of the first  septal perforator  has a diffuse 50% stenosis. There is a diagonal branch that arises from  this area. The diagonal is widely patent. The remaining portions of  the mid and distal LAD have diffuse nonobstructive plaque without  significant focal stenosis.  The left circumflex is a large-caliber caliber vessel. It gives off a  large first OM branch that has an 80% stenosis in its proximal portion.  The AV groove circumflex continues down and provides a small left  posterolateral branch. There is no  significant angiographic disease in  the AV groove circumflex.  The right coronary artery is diffusely diseased. There are luminal  irregularities in the proximal portion. The midportion in the stented  segment that is ectatic. Distally, there is diffuse disease with 50%  stenosis just before the bifurcation of the PDA and posterior AV  segment. The PDA and posterolateral branches have no significant  angiographic disease  Left ventriculography demonstrates normal LV systolic function with an  LVEF of 60%. There is no mitral regurgitation.  ASSESSMENT:  1. Severe obtuse marginal 1 stenosis.  2. Moderate diffuse disease in the left anterior descending artery and  right coronary artery.  3. Normal left ventricular systolic function.  4. Successful percutaneous coronary intervention of the left  circumflex with a bare-metal stent.  Will continue aspirin indefinitely. The patient should be on  ticlopidine for 30 days with close monitoring of his CBC.    02/2013 Lexiscan MPI  Small partially reversible apical to mid anteroseptal defect. Partially reversible mid anterior defect. Fixed mid to basal inferior wall defect. Normal wall motion, thought to be artifact/noted soft tissue attenuation    11/17/13 clinic EKG  NSR,      11/2013 Cath Procedural Findings:  Hemodynamics:  AO 110/62 mean 79 mm Hg  LV 113/12 mm Hg  Coronary angiography:  Coronary dominance: right  Left mainstem: Normal  Left anterior descending (LAD): 30% disease in the proximal vessel. The first diagonal is normal.  Left circumflex (LCx): The LCx gives rise to a large OM 1, a small OM2, and terminates in a posterolateral branch. The stent in the OM 1 is widely patent. No other significant disease in the LCx.  Right coronary artery (RCA): Large vessel. There is 30% disease in the proximal vessel, at the crux, and in the distal RCA.  Left ventriculography: Left ventricular systolic function is normal, LVEF is estimated at  55-65%, there is no significant mitral regurgitation  Final Conclusions:  1. Nonobstructive CAD. The prior stent in OM1 is widely patent.  2. Normal LV function.  Recommendations: Continue medical therapy.    03/2019 echo [facility] Summary   1. Echo contrast utilized to enhance endocardial border definition.   2. The left ventricle is normal in size with mildly increased wall thickness.   3. Normal left ventricular systolic function, ejection fraction 60-65%.   4. Normal right ventricular size and systolic function.   5. The aortic valve is trileaflet with mildly thickened leaflets with normal excursion.   6. Aortic root borderline dilated.   7. Ascending aorta borderline dilated.     Risk Assessment/Calculations:         ASSESSMENT:    1. Chest pain of uncertain etiology   2. Coronary artery disease involving native coronary artery of native heart without angina pectoris   3. SOB (shortness of breath)   4. Bilateral leg edema   5. Hyperlipidemia, unspecified hyperlipidemia type   6. Essential hypertension   7. Morbid obesity (  [place])   8. Diabetes mellitus due to underlying condition with hyperosmolarity without coma, unspecified whether long term insulin use (HCC)      PLAN:  In order of problems listed above:  CAD now with recurrent chest pain somewhat atypical. - 04/2019 nuclear stress moderate inferior ischemia - order exercise myoview -add Imdur 30 mg 1/2 daily(HA on NTG)  Addendum: abnormal NST consistent with ischemia-see above. Discussed with Dr. Carlyle Davis who recommends cardiac catheterization. I spoke with patient who is still having problems with DOE and chest pain. He's agreeable to proceed. I have reviewed the risks, indications, and alternatives to angioplasty and stenting with the patient. Risks include but are not limited to bleeding, infection, vascular injury, stroke, myocardial infection, arrhythmia, kidney injury, radiation-related injury in the  case of prolonged fluoroscopy use, emergency cardiac surgery, and death. The patient understands the risks of serious complication is low (<6%) and patient agrees to proceed.    DOE (LE edema has improved with dietary changes). fairly normal echo at 11/2020 with normal LVEF, did have grade I diastolic dysfunction  , would appear to be related to obesity, perhaps some venous insufficiency or his liver disease -Update echo    Hyperlipidemia  - has not been on statin, he is reluctant due to his liver history despite GI approval - LDL 208 05/2021 -repeat FLP and refer to Dr. Debara Pickett.      HTN  -reasonable control, continue current meds  Morbid obesity -has lost 50 lbs by cutting out soda and tea  DM2  -on insulin A1C 7.8        Shared Decision Making/Informed Consent   Shared Decision Making/Informed Consent The risks [chest pain, shortness of breath, cardiac arrhythmias, dizziness, blood pressure fluctuations, myocardial infarction, stroke/transient ischemic attack, nausea, vomiting, allergic reaction, radiation exposure, metallic taste sensation and life-threatening complications (estimated to be 1 in 10,000)], benefits (risk stratification, diagnosing coronary artery disease, treatment guidance) and alternatives of a nuclear stress test were discussed in detail with Billy Davis and he agrees to proceed.    Medication Adjustments/Labs and Tests Ordered: Current medicines are reviewed at length with the patient today.  Concerns regarding medicines are outlined above.  Medication changes, Labs and Tests ordered today are listed in the Patient Instructions below. Patient Instructions  Medication Instructions:  our physician has recommended you make the following change in your medication:  Start Imdur 15 mg tablets daily   Labwork: Fasting Lipid Panel-Nothing to eat/drink 8 hours prior to having lab drawn   Testing/Procedures: Your physician has requested that you have an  echocardiogram. Echocardiography is a painless test that uses sound waves to create images of your heart. It provides your doctor with information about the size and shape of your heart and how well your heart's chambers and valves are working. This procedure takes approximately one hour. There are no restrictions for this procedure.  Your physician has requested that you have a exercise myoview. For further information please visit [email or web address]. Please follow instruction sheet, as given.   Follow-Up: Follow up with Dr. Harl Davis in the next 1-2 months.   Any Other Special Instructions Will Be Listed Below (If Applicable).  You have been referred to the [facility] with Dr. Debara Pickett. They will call you with your first appointment.    If you need a refill on your cardiac medications before your next appointment, please call your pharmacy.    Sumner Boast, PA-C  04/17/2022 2:00 PM    [facility] [place], [place], [place]  [address] Phone: [phone]; Fax: [phone]

## 2022-04-10 NOTE — Progress Notes (Addendum)
Cardiology Office Note    Date:  04/17/2022   ID:  Billy Davis, Billy Davis Jun 20, 1956, MRN [id number]   PCP:  [name], [place]  Cardiologist:  Carlyle Dolly, MD   Advanced Practice Provider:  No care team member to display Electrophysiologist:  None   [phone]   No chief complaint on file.   History of Present Illness:  Billy Davis is a 66 y.o. male with history of NASH cirrhosis, HTN, HLD  CAD - prior BMS to LCX in 2008, with noted moderate diffuse disease of LAD and RCA that has been treated medically. LVEF by LV gram 60% at that time  - Lexiscan MPI 02/2013 with multiple defects and normal wall motion thought most likely artifact. LVEF 50%.  - cath 12/07/2013 LM patent, LAD 30% prox, LCX patent, OM1 with patent stent, RCA 30% prox disease, LVEF 55-65% by LV gram  [facility] R on 04/25/19 with chest pain occurring at rest over the prior week.  - 04/2019 nuclear Stress [facility]: moderate reversibility inferoapical and inferior wall.  - antianginals were titrated and symptoms resolved. He did not want to pursue cath.  03/2019 echo [facility]: LVEF 60-65%   Patient last saw Dr. Harl Bowie 2021 and told he could take lasix if needed for edmea and GI ok'd him to take a statin. Last echo 11/2020 normal LVEF with G1DD.   Patient comes in with his wife. When he feeds his chickens in am he can't make it back up the hill without having to stop to get his breath. He's having an aching chest pain lasting 10 min and occurs with or without activity. Took ASA for it last week and it quit. Hasn't tried NTG because of HA. No chest pressure or heaviness. Very worried about needing more stents because he had 3 friends died on the cath table of MI's. Was in ED in Jan with chest pain and leg swelling and says he was filled with fluid. He stopped drinking tea and soda. Cut back on salt. Has lost 50 lbs.      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, thrombosed (following 03/20/07 cardiac cath)   CAD, multiple vessel    PCI circumflex 2008, diffuse disease of the right and LAD treated medically.,  //   Nuclear 2010 no ischemia   //   nuclear May, 2012 no ischemia   Cirrhosis of liver without mention of alcohol    Contrast media allergy    pt states he is not allergy to contrast   Diabetes mellitus without complication ([place])    type 2   Diverticulosis    Dysphagia    Ejection fraction    EF 55%, nuclear, May, 2012   Esophageal reflux    IBS (irritable bowel syndrome)    Lower abdominal pain    Mixed dyslipidemia    Morbid obesity ([place])    Other chronic nonalcoholic liver disease    Postsurgical percutaneous transluminal coronary angioplasty status    Preop cardiovascular exam    Cardiovascular clearance for prostate surgery October, 2013   Prostate cancer ([place]) 03/24/12   Unspecified diastolic heart failure    Unspecified essential hypertension     Past Surgical History:  Procedure Laterality Date   BACK SURGERY     took cyst out of back   CARDIAC CATHETERIZATION     CARPAL TUNNEL RELEASE Bilateral  CHOLECYSTECTOMY  06/1998   COLONOSCOPY  03/03/03   COLONOSCOPY  08/17/2011   Procedure: COLONOSCOPY;  Surgeon: Rogene Houston, MD;  [facility];  Service: Endoscopy;  Laterality: N/A;  1045   COLONOSCOPY N/A 07/05/2017   Procedure: COLONOSCOPY;  Surgeon: Rogene Houston, MD;  Location: [facility];  Service: Endoscopy;  Laterality: N/A;  2:00   ESOPHAGOGASTRODUODENOSCOPY (EGD) WITH PROPOFOL N/A 12/20/2017   Procedure: ESOPHAGOGASTRODUODENOSCOPY (EGD) WITH PROPOFOL;  Surgeon: Rogene Houston, MD;  [facility];  Service: Endoscopy;  Laterality: N/A;  11:10 :   LEFT HEART CATHETERIZATION WITH CORONARY ANGIOGRAM N/A 12/07/2013   Procedure: LEFT HEART CATHETERIZATION WITH CORONARY ANGIOGRAM;  Surgeon: Peter M Martinique, MD;  Location: [facility] CATH LAB;  Service: Cardiovascular;   Laterality: N/A;   left rotator     POLYPECTOMY  12/20/2017   Procedure: POLYPECTOMY;  Surgeon: Rogene Houston, MD;  Location: [facility];  Service: Endoscopy;;  Gastric (HS) x2   RIGHT KNEE SURGERY     ROBOT ASSISTED LAPAROSCOPIC RADICAL PROSTATECTOMY  06/26/2012   Procedure: ROBOTIC ASSISTED LAPAROSCOPIC RADICAL PROSTATECTOMY LEVEL 3;  Surgeon: Dutch Gray, MD;  Location: [facility];  Service: Urology;  Laterality: N/A;       Two cardiac stents  2009   UPPER GASTROINTESTINAL ENDOSCOPY  02/21/2011   EGD ED   UPPER GASTROINTESTINAL ENDOSCOPY  03/03/03   TCS   VASECTOMY      Current Medications: Current Meds  Medication Sig   amLODipine (NORVASC) 10 MG tablet Take 10 mg by mouth daily.    aspirin EC 81 MG tablet Take 1 tablet (81 mg total) by mouth daily.   ibuprofen (ADVIL,MOTRIN) 200 MG tablet Take 400 mg by mouth every 8 (eight) hours as needed for mild pain or moderate pain.   insulin glargine (LANTUS) 100 UNIT/ML injection Inject into the skin at bedtime. 50 qam and 45 units qhs   isosorbide mononitrate (IMDUR) 30 MG 24 hr tablet Take 0.5 tablets (15 mg total) by mouth daily.   lisinopril (PRINIVIL,ZESTRIL) 40 MG tablet Take 40 mg by mouth every morning.    metoprolol tartrate (LOPRESSOR) 50 MG tablet Take 1 tablet (50 mg total) by mouth 2 (two) times daily.   Multiple Vitamin (MULTI VITAMIN MENS PO) Take 1 tablet by mouth daily.    NITROSTAT 0.4 MG SL tablet Place 1 tablet under the tongue every 5 (five) minutes as needed for chest pain. For chest pains   pantoprazole (PROTONIX) 40 MG tablet Take 40 mg by mouth daily.   spironolactone (ALDACTONE) 25 MG tablet Take 25 mg by mouth daily.   zinc gluconate 50 MG tablet Take 50 mg by mouth daily.     Allergies:   Codeine and Plavix [clopidogrel bisulfate]   Social History   Socioeconomic History   Marital status: Married    Spouse name: MARTHA   Number of children: Not on file   Years of education: Not on file   Highest  education level: Not on file  Occupational History   Occupation: [employer]  Tobacco Use   Smoking status: Former    Packs/day: 3.00    Years: 30.00    Total pack years: 90.00    Types: Cigarettes    Quit date: 08/27/1998    Years since quitting: 23.6    Passive exposure: Past   Smokeless tobacco: Never   Tobacco comments:    Pt quit smoking 10 yrs ago, smoked for about 30 yrs.  Vaping Use   Vaping Use: Never used  Substance and Sexual Activity   Alcohol use: No    Alcohol/week: 0.0 standard drinks of alcohol   Drug use: No   Sexual activity: Not on file  Other Topics Concern   Not on file  Social History Narrative   Lives in [place], [place]   Works at [employer] Strain: Not on [employer] Insecurity: Not on file  Transportation Needs: Not on file  Physical Activity: Not on file  Stress: Not on file  Social Connections: Not on file     Family History:  The patient's  family history includes Breast cancer in his mother and sister; Coronary artery disease in an other family member; Healthy in his daughter, daughter, daughter, and daughter; Heart disease in his brother, father, and mother; Ovarian cancer in his sister.   ROS:   Please see the history of present illness.    ROS All other systems reviewed and are negative.   PHYSICAL EXAM:   VS:  BP (!) 142/76   Pulse 62   Ht 6' 2"  (1.88 m)   Wt 296 lb (134.3 kg)   SpO2 96%   BMI 38.00 kg/m   Physical Exam  GEN:  Obese, in no acute distress   Neck: no JVD, carotid bruits, or masses Cardiac:RRR; no murmurs, rubs, or gallops  Respiratory:  clear to auscultation bilaterally, normal work of breathing GI: soft, nontender, nondistended, + BS Ext: without cyanosis, clubbing, or edema, Good distal pulses bilaterally Neuro:  Alert and Oriented x 3, Psych: euthymic mood, full affect  Wt Readings from Last 3 Encounters:  04/17/22 296 lb (134.3 kg)  04/09/22 291 lb  6.4 oz (132.2 kg)  12/12/21 290 lb (131.5 kg)      Studies/Labs Reviewed:   EKG:  EKG is not ordered today.    Recent Labs: 09/17/2021: B Natriuretic Peptide 25.0; Magnesium 2.0 04/09/2022: ALT 28; BUN 14; Creatinine, Ser 0.83; Hemoglobin 14.7; Platelets 135; Potassium 4.0; Sodium 141   Lipid Panel No results found for: "CHOL", "TRIG", "HDL", "CHOLHDL", "VLDL", "LDLCALC", "LDLDIRECT"  Additional studies/ records that were reviewed today include:  NST 04/25/22   Findings are consistent with ischemia. The study is intermediate risk.   No ST deviation was noted.   End diastolic cavity size is normal.   Mild intensity and size apical reversible defect consistent with ischemia  Echo 04/23/22 IMPRESSIONS     1. Left ventricular ejection fraction, by estimation, is 55 to 60%. The  left ventricle has normal function. The left ventricle has no regional  wall motion abnormalities. There is mild left ventricular hypertrophy.  Left ventricular diastolic parameters  are consistent with Grade I diastolic dysfunction (impaired relaxation).   2. Right ventricular systolic function is normal. The right ventricular  size is normal.   3. The mitral valve is normal in structure. No evidence of mitral valve  regurgitation. No evidence of mitral stenosis.   4. The aortic valve is tricuspid. There is mild calcification of the  aortic valve. There is mild thickening of the aortic valve. Aortic valve  regurgitation is not visualized. No aortic stenosis is present.   Comparison(s): Echocardiogram done 12/07/20 showed an EF of 60-65%.       Echo 12/07/20  IMPRESSIONS     1. Left ventricular ejection fraction, by estimation, is 60 to 65%. The  left ventricle has normal function. The left ventricle  has no regional  wall motion abnormalities. There is mild left ventricular hypertrophy.  Left ventricular diastolic parameters  are consistent with Grade I diastolic dysfunction (impaired relaxation).   The average left ventricular global longitudinal strain is 17.0 %. The  global longitudinal strain is normal.   2. Right ventricular systolic function is normal. The right ventricular  size is normal.   3. The mitral valve is normal in structure. No evidence of mitral valve  regurgitation. No evidence of mitral stenosis.   4. The aortic valve is tricuspid. There is mild calcification of the  aortic valve. There is mild thickening of the aortic valve. Aortic valve  regurgitation is not visualized. No aortic stenosis is present.   5. The inferior vena cava is normal in size with greater than 50%  respiratory variability, suggesting right atrial pressure of 3 mmHg.   Comparison(s): Nuclear stress test done May of 2012 showed an EF of 55%.  Cardiac catheterization done 12/07/13 showed an EF of 55-65%.   FINDINGS   Left Ventricle: Left ventricular ejection fraction, by estimation, is 60  to 65%. The left ventricle has normal function. The left ventricle has no  regional wall motion abnormalities. The average left ventricular global  longitudinal strain is 17.0 %. The   global longitudinal strain is normal. The left ventricular internal  cavity size was normal in size. There is mild left ventricular  hypertrophy. Left ventricular diastolic parameters are consistent with  Grade I diastolic dysfunction (impaired  relaxation). Normal left ventricular filling pressure.   Right Ventricle: The right ventricular size is normal. No increase in  right ventricular wall thickness. Right ventricular systolic function is  normal.   Left Atrium: Left atrial size was normal in size.   Right Atrium: Right atrial size was normal in size.   Pericardium: There is no evidence of pericardial effusion.   Mitral Valve: The mitral valve is normal in structure. Mild mitral annular  calcification. No evidence of mitral valve regurgitation. No evidence of  mitral valve stenosis.   Tricuspid Valve: The  tricuspid valve is normal in structure. Tricuspid  valve regurgitation is not demonstrated. No evidence of tricuspid  stenosis.   Aortic Valve: The aortic valve is tricuspid. There is mild calcification  of the aortic valve. There is mild thickening of the aortic valve. There  is mild aortic valve annular calcification. Aortic valve regurgitation is  not visualized. No aortic stenosis   is present. Aortic valve mean gradient measures 4.3 mmHg. Aortic valve  peak gradient measures 8.0 mmHg. Aortic valve area, by VTI measures 3.33  cm.   Pulmonic Valve: The pulmonic valve was not well visualized. Pulmonic valve  regurgitation is not visualized. No evidence of pulmonic stenosis.   Aorta: The aortic root is normal in size and structure.   Pulmonary Artery: Indeterminant PASP, inadequate TR jet.   Venous: The inferior vena cava is normal in size with greater than 50%  respiratory variability, suggesting right atrial pressure of 3 mmHg.   IAS/Shunts: No atrial level shunt detected by color flow Doppler.     02/2007 Cath  FINDINGS: Aortic pressure 131/85 with a mean of 106, left ventricular  pressure is 128/16.  The left mainstem has luminal irregularities but no significant  angiographic stenosis.  The left mainstem bifurcates into the LAD and left circumflex. The LAD  is a large-caliber vessel that courses down and reaches the LV apex.  Proximal portion of the LAD in the area of the first  septal perforator  has a diffuse 50% stenosis. There is a diagonal branch that arises from  this area. The diagonal is widely patent. The remaining portions of  the mid and distal LAD have diffuse nonobstructive plaque without  significant focal stenosis.  The left circumflex is a large-caliber caliber vessel. It gives off a  large first OM branch that has an 80% stenosis in its proximal portion.  The AV groove circumflex continues down and provides a small left  posterolateral branch. There is no  significant angiographic disease in  the AV groove circumflex.  The right coronary artery is diffusely diseased. There are luminal  irregularities in the proximal portion. The midportion in the stented  segment that is ectatic. Distally, there is diffuse disease with 50%  stenosis just before the bifurcation of the PDA and posterior AV  segment. The PDA and posterolateral branches have no significant  angiographic disease  Left ventriculography demonstrates normal LV systolic function with an  LVEF of 60%. There is no mitral regurgitation.  ASSESSMENT:  1. Severe obtuse marginal 1 stenosis.  2. Moderate diffuse disease in the left anterior descending artery and  right coronary artery.  3. Normal left ventricular systolic function.  4. Successful percutaneous coronary intervention of the left  circumflex with a bare-metal stent.  Will continue aspirin indefinitely. The patient should be on  ticlopidine for 30 days with close monitoring of his CBC.    02/2013 Lexiscan MPI  Small partially reversible apical to mid anteroseptal defect. Partially reversible mid anterior defect. Fixed mid to basal inferior wall defect. Normal wall motion, thought to be artifact/noted soft tissue attenuation    11/17/13 clinic EKG  NSR,      11/2013 Cath Procedural Findings:  Hemodynamics:  AO 110/62 mean 79 mm Hg  LV 113/12 mm Hg  Coronary angiography:  Coronary dominance: right  Left mainstem: Normal  Left anterior descending (LAD): 30% disease in the proximal vessel. The first diagonal is normal.  Left circumflex (LCx): The LCx gives rise to a large OM 1, a small OM2, and terminates in a posterolateral branch. The stent in the OM 1 is widely patent. No other significant disease in the LCx.  Right coronary artery (RCA): Large vessel. There is 30% disease in the proximal vessel, at the crux, and in the distal RCA.  Left ventriculography: Left ventricular systolic function is normal, LVEF is estimated at  55-65%, there is no significant mitral regurgitation  Final Conclusions:  1. Nonobstructive CAD. The prior stent in OM1 is widely patent.  2. Normal LV function.  Recommendations: Continue medical therapy.    03/2019 echo [facility] Summary   1. Echo contrast utilized to enhance endocardial border definition.   2. The left ventricle is normal in size with mildly increased wall thickness.   3. Normal left ventricular systolic function, ejection fraction 60-65%.   4. Normal right ventricular size and systolic function.   5. The aortic valve is trileaflet with mildly thickened leaflets with normal excursion.   6. Aortic root borderline dilated.   7. Ascending aorta borderline dilated.     Risk Assessment/Calculations:         ASSESSMENT:    1. Chest pain of uncertain etiology   2. Coronary artery disease involving native coronary artery of native heart without angina pectoris   3. SOB (shortness of breath)   4. Bilateral leg edema   5. Hyperlipidemia, unspecified hyperlipidemia type   6. Essential hypertension   7. Morbid obesity (  [place])   8. Diabetes mellitus due to underlying condition with hyperosmolarity without coma, unspecified whether long term insulin use (HCC)      PLAN:  In order of problems listed above:  CAD now with recurrent chest pain somewhat atypical. - 04/2019 nuclear stress moderate inferior ischemia - order exercise myoview -add Imdur 30 mg 1/2 daily(HA on NTG)  Addendum: abnormal NST consistent with ischemia-see above. Discussed with Dr. Carlyle Dolly who recommends cardiac catheterization. I spoke with patient who is still having problems with DOE and chest pain. He's agreeable to proceed. I have reviewed the risks, indications, and alternatives to angioplasty and stenting with the patient. Risks include but are not limited to bleeding, infection, vascular injury, stroke, myocardial infection, arrhythmia, kidney injury, radiation-related injury in the  case of prolonged fluoroscopy use, emergency cardiac surgery, and death. The patient understands the risks of serious complication is low (<7%) and patient agrees to proceed.    DOE (LE edema has improved with dietary changes). fairly normal echo at 11/2020 with normal LVEF, did have grade I diastolic dysfunction  , would appear to be related to obesity, perhaps some venous insufficiency or his liver disease -Update echo    Hyperlipidemia  - has not been on statin, he is reluctant due to his liver history despite GI approval - LDL 208 05/2021 -repeat FLP and refer to Dr. Debara Pickett.      HTN  -reasonable control, continue current meds  Morbid obesity -has lost 50 lbs by cutting out soda and tea  DM2  -on insulin A1C 7.8        Shared Decision Making/Informed Consent   Shared Decision Making/Informed Consent The risks [chest pain, shortness of breath, cardiac arrhythmias, dizziness, blood pressure fluctuations, myocardial infarction, stroke/transient ischemic attack, nausea, vomiting, allergic reaction, radiation exposure, metallic taste sensation and life-threatening complications (estimated to be 1 in 10,000)], benefits (risk stratification, diagnosing coronary artery disease, treatment guidance) and alternatives of a nuclear stress test were discussed in detail with Mr. Exley and he agrees to proceed.    Medication Adjustments/Labs and Tests Ordered: Current medicines are reviewed at length with the patient today.  Concerns regarding medicines are outlined above.  Medication changes, Labs and Tests ordered today are listed in the Patient Instructions below. Patient Instructions  Medication Instructions:  our physician has recommended you make the following change in your medication:  Start Imdur 15 mg tablets daily   Labwork: Fasting Lipid Panel-Nothing to eat/drink 8 hours prior to having lab drawn   Testing/Procedures: Your physician has requested that you have an  echocardiogram. Echocardiography is a painless test that uses sound waves to create images of your heart. It provides your doctor with information about the size and shape of your heart and how well your heart's chambers and valves are working. This procedure takes approximately one hour. There are no restrictions for this procedure.  Your physician has requested that you have a exercise myoview. For further information please visit [email or web address]. Please follow instruction sheet, as given.   Follow-Up: Follow up with Dr. Harl Bowie in the next 1-2 months.   Any Other Special Instructions Will Be Listed Below (If Applicable).  You have been referred to the [facility] with Dr. Debara Pickett. They will call you with your first appointment.    If you need a refill on your cardiac medications before your next appointment, please call your pharmacy.    Sumner Boast, PA-C  04/17/2022 2:00 PM    [facility] [place], [place], [place]  [address] Phone: [phone]; Fax: [phone]

## 2022-04-17 ENCOUNTER — Ambulatory Visit (INDEPENDENT_AMBULATORY_CARE_PROVIDER_SITE_OTHER): Payer: Medicare Other | Admitting: Physician Assistant

## 2022-04-17 ENCOUNTER — Encounter: Payer: Self-pay | Admitting: Physician Assistant

## 2022-04-17 VITALS — BP 142/76 | HR 62 | Ht 74.0 in | Wt 296.0 lb

## 2022-04-17 DIAGNOSIS — E0800 Diabetes mellitus due to underlying condition with hyperosmolarity without nonketotic hyperglycemic-hyperosmolar coma (NKHHC): Secondary | ICD-10-CM

## 2022-04-17 DIAGNOSIS — I2510 Atherosclerotic heart disease of native coronary artery without angina pectoris: Secondary | ICD-10-CM

## 2022-04-17 DIAGNOSIS — E785 Hyperlipidemia, unspecified: Secondary | ICD-10-CM

## 2022-04-17 DIAGNOSIS — R600 Localized edema: Secondary | ICD-10-CM | POA: Diagnosis not present

## 2022-04-17 DIAGNOSIS — R0602 Shortness of breath: Secondary | ICD-10-CM | POA: Diagnosis not present

## 2022-04-17 DIAGNOSIS — R079 Chest pain, unspecified: Secondary | ICD-10-CM

## 2022-04-17 DIAGNOSIS — I10 Essential (primary) hypertension: Secondary | ICD-10-CM

## 2022-04-17 MED ORDER — ISOSORBIDE MONONITRATE ER 30 MG PO TB24: 15.0000 mg | ORAL_TABLET | Freq: Every day | ORAL | 3 refills | Status: DC

## 2022-04-17 NOTE — Patient Instructions (Addendum)
Medication Instructions:  our physician has recommended you make the following change in your medication:  Start Imdur 15 mg tablets daily   Labwork: Fasting Lipid Panel-Nothing to eat/drink 8 hours prior to having lab drawn   Testing/Procedures: Your physician has requested that you have an echocardiogram. Echocardiography is a painless test that uses sound waves to create images of your heart. It provides your doctor with information about the size and shape of your heart and how well your heart's chambers and valves are working. This procedure takes approximately one hour. There are no restrictions for this procedure.  Your physician has requested that you have a exercise myoview. For further information please visit [email or web address]. Please follow instruction sheet, as given.   Follow-Up: Follow up with Dr. Harl Bowie in the next 1-2 months.   Any Other Special Instructions Will Be Listed Below (If Applicable).  You have been referred to the [facility] with Dr. Debara Pickett. They will call you with your first appointment.    If you need a refill on your cardiac medications before your next appointment, please call your pharmacy.

## 2022-04-18 ENCOUNTER — Other Ambulatory Visit (INDEPENDENT_AMBULATORY_CARE_PROVIDER_SITE_OTHER): Payer: Self-pay

## 2022-04-18 ENCOUNTER — Encounter (INDEPENDENT_AMBULATORY_CARE_PROVIDER_SITE_OTHER): Payer: Self-pay

## 2022-04-18 DIAGNOSIS — I10 Essential (primary) hypertension: Secondary | ICD-10-CM

## 2022-04-19 ENCOUNTER — Encounter (INDEPENDENT_AMBULATORY_CARE_PROVIDER_SITE_OTHER): Payer: Self-pay

## 2022-04-23 ENCOUNTER — Ambulatory Visit: Payer: Medicare Other | Attending: Physician Assistant

## 2022-04-23 DIAGNOSIS — R079 Chest pain, unspecified: Secondary | ICD-10-CM | POA: Diagnosis not present

## 2022-04-23 DIAGNOSIS — R0602 Shortness of breath: Secondary | ICD-10-CM

## 2022-04-23 LAB — ECHOCARDIOGRAM COMPLETE
AR max vel: 2.86 cm2
AV Area VTI: 2.95 cm2
AV Area mean vel: 2.92 cm2
AV Mean grad: 4.5 mmHg
AV Peak grad: 8.6 mmHg
Ao pk vel: 1.47 m/s
Area-P 1/2: 2.01 cm2
Calc EF: 58.2 %
S' Lateral: 4.21 cm
Single Plane A2C EF: 54.3 %
Single Plane A4C EF: 62.7 %

## 2022-04-24 ENCOUNTER — Telehealth: Payer: Self-pay

## 2022-04-24 NOTE — Telephone Encounter (Signed)
-----   Message from Imogene Burn, [place] sent at 04/24/2022  8:05 AM EDT ----- Echo shows normal heart function, heart has trouble relaxing-avoid high salt foods.

## 2022-04-24 NOTE — Telephone Encounter (Signed)
Patient notified and verbalized understanding. Patient had no questions or concerns at this time. PCP copied

## 2022-04-25 ENCOUNTER — Encounter (HOSPITAL_COMMUNITY)
Admission: RE | Admit: 2022-04-25 | Discharge: 2022-04-25 | Disposition: A | Discharge status: Home / Self Care | Payer: Medicare Other | Source: Ambulatory Visit | Attending: Physician Assistant | Admitting: Physician Assistant

## 2022-04-25 ENCOUNTER — Ambulatory Visit (HOSPITAL_COMMUNITY)
Admission: RE | Admit: 2022-04-25 | Discharge: 2022-04-25 | Disposition: A | Discharge status: Home / Self Care | Payer: Medicare Other | Source: Ambulatory Visit | Attending: Physician Assistant | Admitting: Physician Assistant

## 2022-04-25 ENCOUNTER — Encounter (HOSPITAL_COMMUNITY): Payer: Self-pay

## 2022-04-25 DIAGNOSIS — R0602 Shortness of breath: Secondary | ICD-10-CM | POA: Insufficient documentation

## 2022-04-25 DIAGNOSIS — R079 Chest pain, unspecified: Secondary | ICD-10-CM | POA: Diagnosis present

## 2022-04-25 HISTORY — DX: Disorder of kidney and ureter, unspecified: N28.9

## 2022-04-25 HISTORY — DX: Heart failure, unspecified: I50.9

## 2022-04-25 LAB — NM MYOCAR MULTI W/SPECT W/WALL MOTION / EF
LV dias vol: 144 mL (ref 62–150)
LV sys vol: 74 mL
Nuc Stress EF: 49 %
Peak HR: 95 {beats}/min
RATE: 0.5
Rest HR: 65 {beats}/min
Rest Nuclear Isotope Dose: 10.1 mCi
SDS: 6
SRS: 3
SSS: 9
ST Depression (mm): 0 mm
Stress Nuclear Isotope Dose: 32 mCi
TID: 1.09

## 2022-04-25 MED ORDER — TECHNETIUM TC 99M TETROFOSMIN IV KIT
30.0000 | PACK | Freq: Once | INTRAVENOUS | Status: AC | PRN
  Administered 2022-04-25: 32 via INTRAVENOUS

## 2022-04-25 MED ORDER — SODIUM CHLORIDE FLUSH 0.9 % IV SOLN
INTRAVENOUS | Status: AC
  Administered 2022-04-25: 10 mL via INTRAVENOUS
  Filled 2022-04-25: qty 10

## 2022-04-25 MED ORDER — TECHNETIUM TC 99M TETROFOSMIN IV KIT
10.0000 | PACK | Freq: Once | INTRAVENOUS | Status: AC | PRN
  Administered 2022-04-25: 10 via INTRAVENOUS

## 2022-04-25 MED ORDER — REGADENOSON 0.4 MG/5ML IV SOLN
INTRAVENOUS | Status: AC
  Administered 2022-04-25: 0.4 mg via INTRAVENOUS
  Filled 2022-04-25: qty 5

## 2022-04-27 NOTE — Addendum Note (Signed)
Addended by: Imogene Burn on: 04/27/2022 08:38 AM   Modules accepted: Orders

## 2022-05-01 ENCOUNTER — Telehealth: Payer: Self-pay | Admitting: Cardiology

## 2022-05-01 NOTE — Telephone Encounter (Signed)
Call was sent to pre op, however, this is in regard to heart cath and not a pre op request. I called the pt and s/w his wife who confirms the call is in regard to heat cath 05/03/22. Wife said she was calling to find out when cath was. She tells me that no one has contacted them with any information to prepare for the heart cath, she states no instructions or anything . She tells me that "they feel like they were left in the dark:. I apologized and assured her that I will send a message to the [place] office to reach out to them with instructions for heart cath. Pt's wife thanked me for the call and the help today.

## 2022-05-01 NOTE — Telephone Encounter (Signed)
Patient called to return B. Strader's call re upcoming surgery.

## 2022-05-02 ENCOUNTER — Telehealth: Payer: Self-pay | Admitting: *Deleted

## 2022-05-02 NOTE — Telephone Encounter (Signed)
Cardiac Catheterization scheduled at [facility] for: Thursday May 03, 2022 9 AM Arrival time and place: [facility] Main Entrance A at: 7 AM  Nothing to eat after midnight prior to procedure, clear liquids until 5 AM day of procedure.  Medication instructions: -Hold:  Insulin-1/2 usual Insulin dose HS prior to procedure  Spironolactone-AM of procedure-pt reports he is taking -does not know dose-will information to hospital tomorrow -Except hold medications usual morning medications can be taken with sips of water including aspirin 81 mg.  Confirmed patient has responsible adult to drive home post procedure and be with patient first 24 hours after arriving home.  Patient reports no new symptoms concerning for COVID-19 in the past 10 days.  Reviewed procedure instructions with patient.

## 2022-05-03 ENCOUNTER — Other Ambulatory Visit (HOSPITAL_COMMUNITY): Payer: Self-pay

## 2022-05-03 ENCOUNTER — Other Ambulatory Visit: Payer: Self-pay

## 2022-05-03 ENCOUNTER — Ambulatory Visit (HOSPITAL_COMMUNITY)
Admission: RE | Admit: 2022-05-03 | Discharge: 2022-05-03 | Disposition: A | Discharge status: Home / Self Care | Payer: Medicare Other | Attending: Internal Medicine | Admitting: Internal Medicine

## 2022-05-03 ENCOUNTER — Telehealth (HOSPITAL_COMMUNITY): Payer: Self-pay

## 2022-05-03 ENCOUNTER — Ambulatory Visit (HOSPITAL_COMMUNITY)
Admission: RE | Disposition: A | Discharge status: Home / Self Care | Payer: Self-pay | Source: Home / Self Care | Attending: Internal Medicine

## 2022-05-03 DIAGNOSIS — E119 Type 2 diabetes mellitus without complications: Secondary | ICD-10-CM | POA: Diagnosis not present

## 2022-05-03 DIAGNOSIS — K7460 Unspecified cirrhosis of liver: Secondary | ICD-10-CM | POA: Insufficient documentation

## 2022-05-03 DIAGNOSIS — K7581 Nonalcoholic steatohepatitis (NASH): Secondary | ICD-10-CM | POA: Diagnosis not present

## 2022-05-03 DIAGNOSIS — R609 Edema, unspecified: Secondary | ICD-10-CM | POA: Diagnosis not present

## 2022-05-03 DIAGNOSIS — I2510 Atherosclerotic heart disease of native coronary artery without angina pectoris: Secondary | ICD-10-CM

## 2022-05-03 DIAGNOSIS — Z794 Long term (current) use of insulin: Secondary | ICD-10-CM | POA: Diagnosis not present

## 2022-05-03 DIAGNOSIS — Z955 Presence of coronary angioplasty implant and graft: Secondary | ICD-10-CM

## 2022-05-03 DIAGNOSIS — R9439 Abnormal result of other cardiovascular function study: Secondary | ICD-10-CM | POA: Diagnosis not present

## 2022-05-03 DIAGNOSIS — I10 Essential (primary) hypertension: Secondary | ICD-10-CM | POA: Diagnosis not present

## 2022-05-03 DIAGNOSIS — E785 Hyperlipidemia, unspecified: Secondary | ICD-10-CM

## 2022-05-03 DIAGNOSIS — Z87891 Personal history of nicotine dependence: Secondary | ICD-10-CM | POA: Insufficient documentation

## 2022-05-03 DIAGNOSIS — Z6838 Body mass index (BMI) 38.0-38.9, adult: Secondary | ICD-10-CM | POA: Insufficient documentation

## 2022-05-03 DIAGNOSIS — I25118 Atherosclerotic heart disease of native coronary artery with other forms of angina pectoris: Secondary | ICD-10-CM | POA: Insufficient documentation

## 2022-05-03 DIAGNOSIS — R079 Chest pain, unspecified: Secondary | ICD-10-CM

## 2022-05-03 DIAGNOSIS — R0602 Shortness of breath: Secondary | ICD-10-CM | POA: Diagnosis not present

## 2022-05-03 HISTORY — PX: INTRAVASCULAR IMAGING/OCT: CATH118326

## 2022-05-03 HISTORY — PX: CORONARY PRESSURE WIRE/FFR WITH 3D MAPPING: CATH118309

## 2022-05-03 HISTORY — PX: LEFT HEART CATH AND CORONARY ANGIOGRAPHY: CATH118249

## 2022-05-03 HISTORY — PX: CORONARY STENT INTERVENTION: CATH118234

## 2022-05-03 LAB — POCT ACTIVATED CLOTTING TIME
Activated Clotting Time: 263 seconds
Activated Clotting Time: 269 seconds
Activated Clotting Time: 287 seconds
Activated Clotting Time: 299 seconds
Activated Clotting Time: 275 seconds

## 2022-05-03 LAB — GLUCOSE, CAPILLARY: Glucose-Capillary: 211 mg/dL — ABNORMAL HIGH (ref 70–99)

## 2022-05-03 SURGERY — LEFT HEART CATH AND CORONARY ANGIOGRAPHY
Anesthesia: LOCAL

## 2022-05-03 MED ORDER — ASPIRIN 81 MG PO CHEW: 81.0000 mg | CHEWABLE_TABLET | Freq: Every day | ORAL | Status: DC

## 2022-05-03 MED ORDER — ASPIRIN 81 MG PO CHEW: 81.0000 mg | CHEWABLE_TABLET | ORAL | Status: DC

## 2022-05-03 MED ORDER — HEPARIN SODIUM (PORCINE) 1000 UNIT/ML IJ SOLN
INTRAMUSCULAR | Status: DC | PRN
  Administered 2022-05-03: 2000 [IU] via INTRAVENOUS
  Administered 2022-05-03: 7000 [IU] via INTRAVENOUS
  Administered 2022-05-03: 5000 [IU] via INTRAVENOUS
  Administered 2022-05-03 (×2): 3000 [IU] via INTRAVENOUS

## 2022-05-03 MED ORDER — SODIUM CHLORIDE 0.9% FLUSH: 3.0000 mL | Freq: Two times a day (BID) | INTRAVENOUS | Status: DC

## 2022-05-03 MED ORDER — TICAGRELOR 90 MG PO TABS
ORAL_TABLET | ORAL | Status: AC
  Filled 2022-05-03: qty 2

## 2022-05-03 MED ORDER — VERAPAMIL HCL 2.5 MG/ML IV SOLN
INTRAVENOUS | Status: AC
  Filled 2022-05-03: qty 2

## 2022-05-03 MED ORDER — HEPARIN SODIUM (PORCINE) 1000 UNIT/ML IJ SOLN
INTRAMUSCULAR | Status: AC
  Filled 2022-05-03: qty 10

## 2022-05-03 MED ORDER — VERAPAMIL HCL 2.5 MG/ML IV SOLN
INTRAVENOUS | Status: DC | PRN
  Administered 2022-05-03: 10 mL via INTRA_ARTERIAL

## 2022-05-03 MED ORDER — HEPARIN (PORCINE) IN NACL 1000-0.9 UT/500ML-% IV SOLN
INTRAVENOUS | Status: AC
  Filled 2022-05-03: qty 1000

## 2022-05-03 MED ORDER — NITROGLYCERIN 1 MG/10 ML FOR IR/CATH LAB
INTRA_ARTERIAL | Status: DC | PRN
  Administered 2022-05-03 (×3): 200 ug via INTRACORONARY

## 2022-05-03 MED ORDER — MIDAZOLAM HCL 2 MG/2ML IJ SOLN
INTRAMUSCULAR | Status: AC
  Filled 2022-05-03: qty 2

## 2022-05-03 MED ORDER — LIDOCAINE HCL (PF) 1 % IJ SOLN
INTRAMUSCULAR | Status: AC
  Filled 2022-05-03: qty 30

## 2022-05-03 MED ORDER — TICAGRELOR 90 MG PO TABS
90.0000 mg | ORAL_TABLET | Freq: Two times a day (BID) | ORAL | 0 refills | Status: DC
  Filled 2022-05-03: qty 60, 30d supply, fill #0

## 2022-05-03 MED ORDER — SODIUM CHLORIDE 0.9 % IV SOLN: 250.0000 mL | INTRAVENOUS | Status: DC | PRN

## 2022-05-03 MED ORDER — MIDAZOLAM HCL 2 MG/2ML IJ SOLN
INTRAMUSCULAR | Status: DC | PRN
  Administered 2022-05-03 (×2): 1 mg via INTRAVENOUS

## 2022-05-03 MED ORDER — ONDANSETRON HCL 4 MG/2ML IJ SOLN: 4.0000 mg | Freq: Four times a day (QID) | INTRAMUSCULAR | Status: DC | PRN

## 2022-05-03 MED ORDER — SODIUM CHLORIDE 0.9 % IV SOLN: INTRAVENOUS | Status: AC

## 2022-05-03 MED ORDER — LABETALOL HCL 5 MG/ML IV SOLN: 10.0000 mg | INTRAVENOUS | Status: DC | PRN

## 2022-05-03 MED ORDER — LIDOCAINE HCL (PF) 1 % IJ SOLN
INTRAMUSCULAR | Status: DC | PRN
  Administered 2022-05-03: 2 mL via INTRADERMAL

## 2022-05-03 MED ORDER — TICAGRELOR 90 MG PO TABS
ORAL_TABLET | ORAL | Status: DC | PRN
  Administered 2022-05-03: 180 mg via ORAL

## 2022-05-03 MED ORDER — HYDRALAZINE HCL 20 MG/ML IJ SOLN: 10.0000 mg | INTRAMUSCULAR | Status: DC | PRN

## 2022-05-03 MED ORDER — IOHEXOL 350 MG/ML SOLN
INTRAVENOUS | Status: DC | PRN
  Administered 2022-05-03: 200 mL

## 2022-05-03 MED ORDER — HEPARIN SODIUM (PORCINE) 1000 UNIT/ML IJ SOLN
INTRAMUSCULAR | Status: AC
Start: 2022-05-03 — End: ?
  Filled 2022-05-03: qty 10

## 2022-05-03 MED ORDER — SODIUM CHLORIDE 0.9 % WEIGHT BASED INFUSION: 1.0000 mL/kg/h | INTRAVENOUS | Status: DC

## 2022-05-03 MED ORDER — FENTANYL CITRATE (PF) 100 MCG/2ML IJ SOLN
INTRAMUSCULAR | Status: DC | PRN
  Administered 2022-05-03 (×2): 25 ug via INTRAVENOUS
  Administered 2022-05-03: 50 ug via INTRAVENOUS

## 2022-05-03 MED ORDER — ACETAMINOPHEN 325 MG PO TABS: 650.0000 mg | ORAL_TABLET | ORAL | Status: DC | PRN

## 2022-05-03 MED ORDER — NITROGLYCERIN 1 MG/10 ML FOR IR/CATH LAB
INTRA_ARTERIAL | Status: AC
  Filled 2022-05-03: qty 10

## 2022-05-03 MED ORDER — SODIUM CHLORIDE 0.9% FLUSH: 3.0000 mL | INTRAVENOUS | Status: DC | PRN

## 2022-05-03 MED ORDER — SODIUM CHLORIDE 0.9 % WEIGHT BASED INFUSION
3.0000 mL/kg/h | INTRAVENOUS | Status: AC
  Administered 2022-05-03: 3 mL/kg/h via INTRAVENOUS

## 2022-05-03 MED ORDER — TICAGRELOR 90 MG PO TABS: 90.0000 mg | ORAL_TABLET | Freq: Two times a day (BID) | ORAL | Status: DC

## 2022-05-03 MED ORDER — FENTANYL CITRATE (PF) 100 MCG/2ML IJ SOLN
INTRAMUSCULAR | Status: AC
  Filled 2022-05-03: qty 2

## 2022-05-03 SURGICAL SUPPLY — 25 items
BALL SAPPHIRE NC24 3.25X22 (BALLOONS) ×1
BALL SAPPHIRE NC24 3.5X12 (BALLOONS) ×1
BALLN SCOREFLEX 3.0X15 (BALLOONS) ×1
BALLOON SAPPHIRE NC24 3.25X22 (BALLOONS) IMPLANT
BALLOON SAPPHIRE NC24 3.5X12 (BALLOONS) IMPLANT
BALLOON SCOREFLEX 3.0X15 (BALLOONS) IMPLANT
CATH 5FR JL3.5 JR4 ANG PIG MP (CATHETERS) IMPLANT
CATH DRAGONFLY OPSTAR (CATHETERS) IMPLANT
CATH INFINITI 5FR AL1 (CATHETERS) IMPLANT
CATH VISTA GUIDE 6FR AL1 (CATHETERS) IMPLANT
CATH VISTA GUIDE 6FR XB3.5 (CATHETERS) IMPLANT
DEVICE RAD COMP TR BAND LRG (VASCULAR PRODUCTS) IMPLANT
GLIDESHEATH SLEND SS 6F .021 (SHEATH) IMPLANT
GUIDEWIRE INQWIRE 1.5J.035X260 (WIRE) IMPLANT
GUIDEWIRE PRESSURE X 175 (WIRE) IMPLANT
INQWIRE 1.5J .035X260CM (WIRE) ×1
KIT ENCORE 26 ADVANTAGE (KITS) IMPLANT
KIT ESSENTIALS PG (KITS) IMPLANT
KIT HEART LEFT (KITS) ×1 IMPLANT
PACK CARDIAC CATHETERIZATION (CUSTOM PROCEDURE TRAY) ×1 IMPLANT
STENT SYNERGY XD 3.0X38 (Permanent Stent) IMPLANT
SYNERGY XD 3.0X38 (Permanent Stent) ×1 IMPLANT
TRANSDUCER W/STOPCOCK (MISCELLANEOUS) ×1 IMPLANT
TUBING CIL FLEX 10 FLL-RA (TUBING) ×1 IMPLANT
WIRE RUNTHROUGH IZANAI 014 180 (WIRE) IMPLANT

## 2022-05-03 NOTE — TOC Benefit Eligibility Note (Signed)
Patient Teacher, English as a foreign language completed.    The patient is currently admitted and upon discharge could be taking Brilinta 35m.  The current 30 day co-pay is $58.73.   The patient is insured through [employer] [employer] Direct Number: [phone] Fax: [phone]

## 2022-05-03 NOTE — Telephone Encounter (Signed)
Pharmacy Patient Advocate Encounter  Insurance verification completed.    The patient is insured through [facility] Part D   The patient is currently admitted and ran test claims for the following: Brilinta.  Copays and coinsurance results were relayed to Inpatient clinical team.

## 2022-05-03 NOTE — Progress Notes (Signed)
CARDIAC REHAB PHASE I     Post stent education including site care, antiplatelet therapy importance, exercise guidelines,restrictions, heart healthy diabetic diet, and CRP2 reviewed with pt and wife. All questions and concerns addressed. Will refer pt to [facility] for CRP2. Plan for home today.  [id number]  Vanessa Barbara, RN BSN 05/03/2022 1:59 PM

## 2022-05-03 NOTE — Discharge Summary (Signed)
Discharge Summary for Same Day PCI   Patient ID: Billy Davis MRN: [id number]; DOB: 11-08-1955  Admit date: 05/03/2022 Discharge date: 05/04/2022  Primary Care Provider: [name], MD  Primary Cardiologist: Carlyle Dolly, MD  Primary Electrophysiologist:  None   Discharge Diagnoses    Active Problems:   Coronary artery disease of native artery of native heart with stable angina pectoris [facility])   Abnormal stress test    Diagnostic Studies/Procedures    Cardiac Catheterization 05/04/2022:  Conclusions: Multivessel coronary artery disease with 60% proximal, 40% mid, and diffuse 60-70% distal/apical LAD disease, 70% D1 stenosis, and multifocal 40-60% disease in the proximal through distal RCA.  Proximal/mid LAD is highly significant by hemodynamic assessment (RFR 0.79).  Proximal/mid RCA disease is not significant (RFR 0.94). Patent OM2 stent with up to 30% in-stent restenosis. Widely patent mid RCA stent. Normal left ventricular filling pressure (LVEDP 10 mmHg). Successful RFR- and OCT-guided PCI to ostial through mid LAD using Synergy 3.0 x 38 mm drug-eluting stent (postdilated up to 3.6 mm) with 0% residual stenosis and TIMI-3 flow.   Recommendations: Dual antiplatelet therapy with aspirin and ticagrelor for at least 6 months, given patient's history of clopidogrel allergy. Aggressive secondary prevention of coronary artery disease.  Favor medical therapy of distal/apical LAD disease given distal location and small vessel size. Anticipate same-day discharge if no complications occurred during 6 hours of post PCI monitoring.   Nelva Bush, MD [facility] _____________   History of Present Illness     Billy Davis is a 66 y.o. male with past medical history of hypertension, hyperlipidemia, CAD status post bare-metal stent to circumflex '08, cirrhosis, GERD and prostate CA who recently presented to the office for follow-up.  He underwent cardiac catheterization  11/2013 with patent left main, LAD with 30%, patent left circumflex and OM1 with patent stent, RCA with 30% proximal disease.  LVEF was 55 to 65% via LV gram.  Presented to [facility]2019 with chest pain and underwent stress test which showed moderate reversible inferior apical and inferior wall defect.  His antianginals were titrated with symptom relief and he did not wish to pursue cath at that time.  Recently presented to the office on 8/22 and reported anginal symptoms with feeding his chickens when he had to walk uphill.  Given his ongoing symptoms he was set up for outpatient cardiac catheterization.   Hospital Course     The patient underwent cardiac cath as noted above with multivessel CAD, successful RFR and OTC guided PCI/DES of ostial through mid LAD.  Medical therapy of distal/apical LAD disease. Plan for DAPT with ASA/Brilinta for at least 6 months. The patient was seen by cardiac rehab while in short stay. There were no observed complications post cath. Radial cath site was re-evaluated prior to discharge and found to be stable without any complications. Instructions/precautions regarding cath site care were given prior to discharge.  Adair Patter was seen by Dr. Saunders Revel and determined stable for discharge home. Follow up with our office has been arranged. Medications are listed below. Pertinent changes include addition of Brilinta.  _____________  Cath/PCI Registry Performance & Quality Measures: Aspirin prescribed? - Yes ADP Receptor Inhibitor (Plavix/Clopidogrel, Brilinta/Ticagrelor or Effient/Prasugrel) prescribed (includes medically managed patients)? - Yes High Intensity Statin (Lipitor 40-62m or Crestor 20-466m prescribed? - No - statin intolerant For EF <40%, was ACEI/ARB prescribed? - Not Applicable (EF >/= 4050%For EF <40%, Aldosterone Antagonist (Spironolactone or Eplerenone) prescribed? - Not Applicable (EF >/=  40%) Cardiac Rehab Phase II ordered (Included  Medically managed Patients)? - Yes  _____________   Discharge Vitals Blood pressure (!) 150/81, pulse 64, temperature 98.1 F (36.7 C), temperature source Temporal, resp. rate 18, height 6' 2"  (1.88 m), weight 127.9 kg, SpO2 97 %.  Filed Weights   05/03/22 0658  Weight: 127.9 kg    Last Labs & Radiologic Studies    CBC No results for input(s): "WBC", "NEUTROABS", "HGB", "HCT", "MCV", "PLT" in the last 72 hours. Basic Metabolic Panel No results for input(s): "NA", "K", "CL", "CO2", "GLUCOSE", "BUN", "CREATININE", "CALCIUM", "MG", "PHOS" in the last 72 hours. Liver Function Tests No results for input(s): "AST", "ALT", "ALKPHOS", "BILITOT", "PROT", "ALBUMIN" in the last 72 hours. No results for input(s): "LIPASE", "AMYLASE" in the last 72 hours. High Sensitivity Troponin:   No results for input(s): "TROPONINIHS" in the last 720 hours.  BNP Invalid input(s): "POCBNP" D-Dimer No results for input(s): "DDIMER" in the last 72 hours. Hemoglobin A1C No results for input(s): "HGBA1C" in the last 72 hours. Fasting Lipid Panel No results for input(s): "CHOL", "HDL", "LDLCALC", "TRIG", "CHOLHDL", "LDLDIRECT" in the last 72 hours. Thyroid Function Tests No results for input(s): "TSH", "T4TOTAL", "T3FREE", "THYROIDAB" in the last 72 hours.  Invalid input(s): "FREET3" _____________  CARDIAC CATHETERIZATION  Result Date: 05/03/2022 Conclusions: Multivessel coronary artery disease with 60% proximal, 40% mid, and diffuse 60-70% distal/apical LAD disease, 70% D1 stenosis, and multifocal 40-60% disease in the proximal through distal RCA.  Proximal/mid LAD is highly significant by hemodynamic assessment (RFR 0.79).  Proximal/mid RCA disease is not significant (RFR 0.94). Patent OM2 stent with up to 30% in-stent restenosis. Widely patent mid RCA stent. Normal left ventricular filling pressure (LVEDP 10 mmHg). Successful RFR- and OCT-guided PCI to ostial through mid LAD using Synergy 3.0 x 38 mm  drug-eluting stent (postdilated up to 3.6 mm) with 0% residual stenosis and TIMI-3 flow. Recommendations: Dual antiplatelet therapy with aspirin and ticagrelor for at least 6 months, given patient's history of clopidogrel allergy. Aggressive secondary prevention of coronary artery disease.  Favor medical therapy of distal/apical LAD disease given distal location and small vessel size. Anticipate same-day discharge if no complications occurred during 6 hours of post PCI monitoring. Nelva Bush, MD [facility]  NM Myocar Multi W/Spect Tamela Oddi Motion / EF  Result Date: 04/25/2022   Findings are consistent with ischemia. The study is intermediate risk.   No ST deviation was noted.   End diastolic cavity size is normal. Mild intensity and size apical reversible defect consistent with ischemia   ECHOCARDIOGRAM COMPLETE  Result Date: 04/23/2022    ECHOCARDIOGRAM REPORT   Patient Name:   CHEY CHO Date of Exam: 04/23/2022 Medical Rec #:  [id number]      Height:       74.0 in Accession #:    [id number]     Weight:       296.0 lb Date of Birth:  04/24/1956      BSA:          2.568 m Patient Age:    14 years       BP:           142/76 mmHg Patient Gender: M              HR:           59 bpm. Exam Location:  [place] Procedure: 2D Echo, Cardiac Doppler and Color Doppler Indications:    R06.02 SOB; R07.9* Chest pain, unspecified  History:        Patient has prior history of Echocardiogram examinations, most                 recent 12/07/2020. CAD, Cirrhosis, nonalcoholic, Covid 19,                 Signs/Symptoms:Chest Pain, Shortness of Breath and Lower                 extremity edema; Risk Factors:Former Smoker, Diabetes and                 Hypertension.  Sonographer:    Jeneen [place] RDMS, RVT, RDCS Referring Phys: [id number] Jennye Moccasin [facility]  Sonographer Comments: Global longitudinal strain was attempted. IMPRESSIONS  1. Left ventricular ejection fraction, by estimation, is 55 to 60%. The left ventricle has normal  function. The left ventricle has no regional wall motion abnormalities. There is mild left ventricular hypertrophy. Left ventricular diastolic parameters are consistent with Grade I diastolic dysfunction (impaired relaxation).  2. Right ventricular systolic function is normal. The right ventricular size is normal.  3. The mitral valve is normal in structure. No evidence of mitral valve regurgitation. No evidence of mitral stenosis.  4. The aortic valve is tricuspid. There is mild calcification of the aortic valve. There is mild thickening of the aortic valve. Aortic valve regurgitation is not visualized. No aortic stenosis is present. Comparison(s): Echocardiogram done 12/07/20 showed an EF of 60-65%. FINDINGS  Left Ventricle: Left ventricular ejection fraction, by estimation, is 55 to 60%. The left ventricle has normal function. The left ventricle has no regional wall motion abnormalities. Global longitudinal strain performed but not reported based on interpreter judgement due to suboptimal tracking. The left ventricular internal cavity size was normal in size. There is mild left ventricular hypertrophy. Left ventricular diastolic parameters are consistent with Grade I diastolic dysfunction (impaired relaxation). Normal left ventricular filling pressure. Right Ventricle: The right ventricular size is normal. Right vetricular wall thickness was not well visualized. Right ventricular systolic function is normal. Left Atrium: Left atrial size was normal in size. Right Atrium: Right atrial size was normal in size. Pericardium: There is no evidence of pericardial effusion. Mitral Valve: The mitral valve is normal in structure. No evidence of mitral valve regurgitation. No evidence of mitral valve stenosis. Tricuspid Valve: The tricuspid valve is normal in structure. Tricuspid valve regurgitation is not demonstrated. No evidence of tricuspid stenosis. Aortic Valve: The aortic valve is tricuspid. There is mild  calcification of the aortic valve. There is mild thickening of the aortic valve. There is mild aortic valve annular calcification. Aortic valve regurgitation is not visualized. No aortic stenosis  is present. Aortic valve mean gradient measures 4.5 mmHg. Aortic valve peak gradient measures 8.6 mmHg. Aortic valve area, by VTI measures 2.95 cm. Pulmonic Valve: The pulmonic valve was not well visualized. Pulmonic valve regurgitation is not visualized. No evidence of pulmonic stenosis. Aorta: The aortic root is normal in size and structure. IAS/Shunts: No atrial level shunt detected by color flow Doppler.  LEFT VENTRICLE PLAX 2D LVIDd:         6.02 cm      Diastology LVIDs:         4.21 cm      LV e' medial:    5.22 cm/s LV PW:         1.29 cm      LV E/e' medial:  9.0 LV IVS:  1.30 cm      LV e' lateral:   5.33 cm/s LVOT diam:     2.30 cm      LV E/e' lateral: 8.8 LV SV:         82 LV SV Index:   32 LVOT Area:     4.15 cm  LV Volumes (MOD) LV vol d, MOD A2C: 125.0 ml LV vol d, MOD A4C: 131.0 ml LV vol s, MOD A2C: 57.1 ml LV vol s, MOD A4C: 48.8 ml LV SV MOD A2C:     67.9 ml LV SV MOD A4C:     131.0 ml LV SV MOD BP:      74.4 ml RIGHT VENTRICLE RV S prime:     15.40 cm/s TAPSE (M-mode): 3.3 cm LEFT ATRIUM             Index        RIGHT ATRIUM           Index LA diam:        4.10 cm 1.60 cm/m   RA Area:     20.00 cm LA Vol (A2C):   83.8 ml 32.64 ml/m  RA Volume:   60.70 ml  23.64 ml/m LA Vol (A4C):   68.8 ml 26.80 ml/m LA Biplane Vol: 77.4 ml 30.15 ml/m  AORTIC VALVE                     PULMONIC VALVE AV Area (Vmax):    2.86 cm      PR End Diast Vel: 2.84 msec AV Area (Vmean):   2.92 cm AV Area (VTI):     2.95 cm AV Vmax:           146.69 cm/s AV Vmean:          100.598 cm/s AV VTI:            0.279 m AV Peak Grad:      8.6 mmHg AV Mean Grad:      4.5 mmHg LVOT Vmax:         101.00 cm/s LVOT Vmean:        70.700 cm/s LVOT VTI:          0.198 m LVOT/AV VTI ratio: 0.71  AORTA Ao Root diam: 3.60 cm Ao Asc  diam:  3.70 cm MITRAL VALVE               TRICUSPID VALVE MV Area (PHT): 2.01 cm    TR Peak grad:   14.3 mmHg MV Decel Time: 378 msec    TR Vmax:        189.00 cm/s MV E velocity: 46.80 cm/s MV A velocity: 77.90 cm/s  SHUNTS MV E/A ratio:  0.60        Systemic VTI:  0.20 m                            Systemic Diam: 2.30 cm Carlyle Dolly MD Electronically signed by Carlyle Dolly MD Signature Date/Time: 04/23/2022/10:56:43 AM    Final     Disposition   Pt is being discharged home today in good condition.  Follow-up Plans & Appointments     Follow-up Information     Erma Heritage, PA-C Follow up on 05/31/2022.   Specialties: Physician Assistant, Cardiology Why: at 10:30 Contact information: [place] [address] [phone]  Discharge Instructions     AMB Referral to Advanced Lipid Disorders Clinic   Complete by: As directed    Internal Lipid Clinic Referral Scheduling  Internal lipid clinic referrals are providers within [facility], who wish to refer established patients for routine management (help in starting PCSK9 inhibitor therapy) or advanced therapies.  Internal MD referral criteria:              1. All patients with LDL>190 mg/dL  2. All patients with Triglycerides >500 mg/dL  3. Patients with suspected or confirmed heterozygous familial hyperlipidemia (HeFH) or homozygous familial hyperlipidemia (HoFH)  4. Patients with family history of suspicious for genetic dyslipidemia desiring genetic testing  5. Patients refractory to standard guideline based therapy  6. Patients with statin intolerance (failed 2 statins, one of which must be a high potency statin)  7. Patients who the provider desires to be seen by MD   Internal PharmD referral criteria:   1. Follow-up patients for medication management  2. Follow-up for compliance monitoring  3. Patients for drug education  4. Patients with statin intolerance  5. PCSK9 inhibitor education  and prior authorization approvals  6. Patients with triglycerides <500 mg/dL  External Lipid Clinic Referral  External lipid clinic referrals are for providers outside of [facility], considered new clinic patients - automatically routed to MD schedule   AMB Referral to Cardiac Rehabilitation - Phase II   Complete by: As directed    Diagnosis: Coronary Stents   After initial evaluation and assessments completed: Virtual Based Care may be provided alone or in conjunction with Phase 2 Cardiac Rehab based on patient barriers.: Yes   Intensive Cardiac Rehabilitation (ICR) [place] location only OR Traditional Cardiac Rehabilitation (TCR) If criteria for ICR are not met will enroll in TCR [facility] only): Yes        Discharge Medications   Allergies as of 05/03/2022       Reactions   Codeine Palpitations   Plavix [clopidogrel Bisulfate] Rash        Medication List     STOP taking these medications    ibuprofen 200 MG tablet Commonly known as: ADVIL   isosorbide mononitrate 30 MG 24 hr tablet Commonly known as: IMDUR       TAKE these medications    amLODipine 10 MG tablet Commonly known as: NORVASC Take 10 mg by mouth daily.   aspirin EC 81 MG tablet Take 1 tablet (81 mg total) by mouth daily.   Brilinta 90 MG Tabs tablet Generic drug: ticagrelor Take 1 tablet (90 mg total) by mouth 2 (two) times daily.   insulin glargine 100 UNIT/ML injection Commonly known as: LANTUS Inject 45-50 Units into the skin See admin instructions. 50 units in the morning, and 45 units at bedtime   lisinopril 40 MG tablet Commonly known as: ZESTRIL Take 40 mg by mouth every morning.   metoprolol tartrate 50 MG tablet Commonly known as: LOPRESSOR Take 1 tablet (50 mg total) by mouth 2 (two) times daily.   MULTI VITAMIN MENS PO Take 1 tablet by mouth daily.   nitroGLYCERIN 0.4 MG SL tablet Commonly known as: NITROSTAT Place 1 tablet under the tongue every 5 (five) minutes as needed for  chest pain. For chest pains   pantoprazole 40 MG tablet Commonly known as: PROTONIX Take 40 mg by mouth daily.   zinc gluconate 50 MG tablet Take 50 mg by mouth daily.         Allergies Allergies  Allergen Reactions  Codeine Palpitations   Plavix [Clopidogrel Bisulfate] Rash    Outstanding Labs/Studies   Referred to lipid clinic   Duration of Discharge Encounter   Greater than 30 minutes including physician time.  Signed, Reino Bellis, NP 05/04/2022, 2:23 PM

## 2022-05-03 NOTE — Interval H&P Note (Signed)
History and Physical Interval Note:  05/03/2022 8:36 AM  Billy Davis  has presented today for surgery, with the diagnosis of abnormal stress test and stable angina.  The various methods of treatment have been discussed with the patient and family. After consideration of risks, benefits and other options for treatment, the patient has consented to  Procedure(s): LEFT HEART CATH AND CORONARY ANGIOGRAPHY (N/A) as a surgical intervention.  The patient's history has been reviewed, patient examined, no change in status, stable for surgery.  I have reviewed the patient's chart and labs.  Questions were answered to the patient's satisfaction.    Cath Lab Visit (complete for each Cath Lab visit)  Clinical Evaluation Leading to the Procedure:   ACS: No.  Non-ACS:    Anginal Classification: CCS III  Anti-ischemic medical therapy: Maximal Therapy (2 or more classes of medications)  Non-Invasive Test Results: Intermediate-risk stress test findings: cardiac mortality 1-3%/year  Prior CABG: No previous CABG  Billy Davis

## 2022-05-04 ENCOUNTER — Encounter (HOSPITAL_COMMUNITY): Payer: Self-pay | Admitting: Internal Medicine

## 2022-05-04 ENCOUNTER — Encounter (HOSPITAL_COMMUNITY): Payer: Medicare Other

## 2022-05-04 MED FILL — Heparin Sodium (Porcine) Inj 1000 Unit/ML: INTRAMUSCULAR | Qty: 10 | Status: AC

## 2022-05-04 MED FILL — Heparin Sod (Porcine)-NaCl IV Soln 1000 Unit/500ML-0.9%: INTRAVENOUS | Qty: 1000 | Status: AC

## 2022-05-07 ENCOUNTER — Ambulatory Visit: Payer: Medicare Other | Admitting: Nutrition

## 2022-05-09 ENCOUNTER — Telehealth (HOSPITAL_COMMUNITY): Payer: Self-pay | Admitting: *Deleted

## 2022-05-09 NOTE — Telephone Encounter (Signed)
Referral for Cardiac Rehab phase II sent to [facility] per pt request. Maurice Small RN, BSN Cardiac and Pulmonary Rehab Nurse Navigator

## 2022-05-10 ENCOUNTER — Encounter (HOSPITAL_COMMUNITY): Admission: RE | Payer: Self-pay | Source: Home / Self Care

## 2022-05-10 ENCOUNTER — Ambulatory Visit (HOSPITAL_COMMUNITY): Admission: RE | Admit: 2022-05-10 | Payer: Medicare Other | Source: Home / Self Care | Admitting: Gastroenterology

## 2022-05-10 SURGERY — ESOPHAGOGASTRODUODENOSCOPY (EGD) WITH PROPOFOL
Anesthesia: Monitor Anesthesia Care

## 2022-05-11 ENCOUNTER — Encounter: Payer: Self-pay | Admitting: Internal Medicine

## 2022-05-14 ENCOUNTER — Telehealth: Payer: Self-pay | Admitting: Cardiology

## 2022-05-14 MED ORDER — FUROSEMIDE 20 MG PO TABS: 20.0000 mg | ORAL_TABLET | Freq: Every day | ORAL | 1 refills | Status: DC

## 2022-05-14 NOTE — Telephone Encounter (Signed)
Pt c/o swelling: STAT is pt has developed SOB within 24 hours  If swelling, where is the swelling located?  Fingers  How much weight have you gained and in what time span?  9 lbs in a week  Have you gained 3 pounds in a day or 5 pounds in a week?   9 lbs in a week  Do you have a log of your daily weights (if so, list)?   No  Are you currently taking a fluid pill?  Yes  Are you currently SOB?   No  Have you traveled recently? No    Patient stated he cannot take this blood thinner medication ticagrelor (BRILINTA) 90 MG TABS tablet. Patient stated he is also allergic to Plavix and would like alternate medication.

## 2022-05-14 NOTE — Telephone Encounter (Signed)
Pt stated that since starting Brilinta he has gained 9-10 lbs. Pt stated base weight at home is 282 lbs and after weighing this morning, he weighs 292 lbs. Pt c/o sob, cough, and upset stomach. Pt also stated that he is allergic to Plavix and cannot take this medication as well. Pt is asking for alternative to medication. Please advise.

## 2022-05-14 NOTE — Telephone Encounter (Signed)
Pt stated he was having some stomach bloating. Pt is agreeable to Lasix 20 mg po qd. Pt also agreeable to see provider Friday @ noon.

## 2022-05-14 NOTE — Telephone Encounter (Signed)
I think unlikely brilinta cause of weight gain, may be retaining some extra fluid for other reason. Any swelling in the legs, stomach bloating? I would start lasix 102m daily and have him see me this Friday at noon if able. Would continue brilinta for now  JZandra AbtsMD

## 2022-05-18 ENCOUNTER — Ambulatory Visit: Payer: Medicare Other | Attending: Cardiology | Admitting: Cardiology

## 2022-05-18 ENCOUNTER — Encounter: Payer: Self-pay | Admitting: Cardiology

## 2022-05-18 VITALS — BP 130/80 | HR 60 | Ht 74.0 in | Wt 297.4 lb

## 2022-05-18 DIAGNOSIS — I2510 Atherosclerotic heart disease of native coronary artery without angina pectoris: Secondary | ICD-10-CM | POA: Diagnosis not present

## 2022-05-18 DIAGNOSIS — I10 Essential (primary) hypertension: Secondary | ICD-10-CM | POA: Diagnosis not present

## 2022-05-18 DIAGNOSIS — E782 Mixed hyperlipidemia: Secondary | ICD-10-CM

## 2022-05-18 MED ORDER — PRASUGREL HCL 10 MG PO TABS: ORAL_TABLET | ORAL | 3 refills | Status: DC

## 2022-05-18 MED ORDER — NITROGLYCERIN 0.4 MG SL SUBL: 0.4000 mg | SUBLINGUAL_TABLET | SUBLINGUAL | 3 refills | Status: DC | PRN

## 2022-05-18 NOTE — Progress Notes (Signed)
Clinical Summary Mr. Kloepfer is a 66 y.o.male seen today as a new patient for the following medical problems.    1. CAD  - prior BMS to LCX in 2008, with noted moderate diffuse disease of LAD and RCA that has been treated medically. LVEF by LV gram 60% at that time  - Lexiscan MPI 02/2013 with multiple defects and normal wall motion thought most likely artifact. LVEF 50%.  - cath 12/07/2013 LM patent, LAD 30% prox, LCX patent, OM1 with patent stent, RCA 30% prox disease, LVEF 55-65% by LV gram     From [facility] notes presented to [facility] on 04/25/19 with chest pain occurring at rest over the prior week.  - 04/2019 nuclear Stress [facility]: moderate reversibility inferoapical and inferior wall.  - antianginals were titrated and symptoms resolved. He did not want to pursue cath.  03/2019 echo [facility]: LVEF 60-65%      03/2022 echo: LVEF 55-60%, no WMAs, grade Idd.  03/2022 nuclear stress: apical ischemia 04/2022 echo: prox LAD to mid 60% and distal 65% with FFR 0.79, D1 70%, LCX patent, OM2 30%, RCA 60%. Received DES to LAD  - reported weight gain 9-10 lbs, edema since cath.He feels may be related to brilinta - has plavix allergy with rash 04/17/2022 wt 296 lbs, 11/2020 296 lbs, today 297 lbs - Home scale 282 lbs up to 299 lbs.  -progressiong SOB, abdomdinal distension, LE edema since stent placement.      2. Hyperlipidemia  - 11/ 2020 TC 190 TG 90 HDL 52 LDL 122 - started 05/2019 atorva 22m x 146monthtopped - reluctant to take statin  Addendum: heard back from Dr ReLaural Goldenhe would be ok with starting a statin. We will make patient aware and see if this influences his decision to start.   - remains not interested in statin    Other medical issues not addressed this visit   3. 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 Preoperative evaluation - considering knee replacement - tolerates greater than 4 METs without  significant symptoms, Past Medical History:  Diagnosis Date   [place]neurysm ([place]   right femoral pseudoaneurysm, thrombosed (following 03/20/07 cardiac cath)   CAD, multiple vessel    PCI circumflex 2008, diffuse disease of the right and LAD treated medically.,  //   Nuclear 2010 no ischemia   //   nuclear May, 2012 no ischemia   CHF (congestive heart failure) ([facility]   Cirrhosis of liver without mention of alcohol    Contrast media allergy    pt states he is not allergy to contrast   Diabetes mellitus without complication ([facility]   type 2   Diverticulosis    Dysphagia    Ejection fraction    EF 55%, nuclear, May, 2012   Esophageal reflux    IBS (irritable bowel syndrome)    Lower abdominal pain    Mixed dyslipidemia    Morbid obesity ([facility]   Other chronic nonalcoholic liver disease    Postsurgical percutaneous transluminal coronary angioplasty status    Preop cardiovascular exam    Cardiovascular clearance for prostate surgery October, 2013   Prostate cancer ([facility] [place]/29/2013   Renal insufficiency    Unspecified diastolic heart failure    Unspecified essential hypertension      Allergies  Allergen Reactions   Codeine Palpitations  Plavix [Clopidogrel Bisulfate] Rash     Current Outpatient Medications  Medication Sig Dispense Refill   amLODipine (NORVASC) 10 MG tablet Take 10 mg by mouth daily.      aspirin EC 81 MG tablet Take 1 tablet (81 mg total) by mouth daily.     furosemide (LASIX) 20 MG tablet Take 1 tablet (20 mg total) by mouth daily. 30 tablet 1   insulin glargine (LANTUS) 100 UNIT/ML injection Inject 45-50 Units into the skin See admin instructions. 50 units in the morning, and 45 units at bedtime     lisinopril (PRINIVIL,ZESTRIL) 40 MG tablet Take 40 mg by mouth every morning.      metoprolol tartrate (LOPRESSOR) 50 MG tablet Take 1 tablet (50 mg total) by mouth 2 (two) times daily. 180 tablet 1   Multiple Vitamin (MULTI VITAMIN MENS PO) Take 1 tablet by mouth  daily.      nitroGLYCERIN (NITROSTAT) 0.4 MG SL tablet Place 1 tablet under the tongue every 5 (five) minutes as needed for chest pain. For chest pains     pantoprazole (PROTONIX) 40 MG tablet Take 40 mg by mouth daily.     ticagrelor (BRILINTA) 90 MG TABS tablet Take 1 tablet (90 mg total) by mouth 2 (two) times daily. 60 tablet 0   zinc gluconate 50 MG tablet Take 50 mg by mouth daily.     No current facility-administered medications for this visit.     Past Surgical History:  Procedure Laterality Date   BACK SURGERY     took cyst out of back   CARDIAC CATHETERIZATION     CARPAL TUNNEL RELEASE Bilateral    CHOLECYSTECTOMY  06/1998   COLONOSCOPY  03/03/03   COLONOSCOPY  08/17/2011   Procedure: COLONOSCOPY;  Surgeon: Rogene Houston, MD;  [facility];  Service: Endoscopy;  Laterality: N/A;  1045   COLONOSCOPY N/A 07/05/2017   Procedure: COLONOSCOPY;  Surgeon: Rogene Houston, MD;  [facility];  Service: Endoscopy;  Laterality: N/A;  2:00   CORONARY PRESSURE WIRE/FFR WITH 3D MAPPING N/A 05/03/2022   Procedure: Coronary Pressure Wire/FFR w/3D Mapping;  Surgeon: Nelva Bush, MD;  Location: [place] [facility];  Service: Cardiovascular;  Laterality: N/A;   CORONARY STENT INTERVENTION N/A 05/03/2022   Procedure: CORONARY STENT INTERVENTION;  Surgeon: Nelva Bush, MD;  Location: [place] [facility];  Service: Cardiovascular;  Laterality: N/A;   ESOPHAGOGASTRODUODENOSCOPY (EGD) WITH PROPOFOL N/A 12/20/2017   Procedure: ESOPHAGOGASTRODUODENOSCOPY (EGD) WITH PROPOFOL;  Surgeon: Rogene Houston, MD;  [facility];  Service: Endoscopy;  Laterality: N/A;  11:10 :   INTRAVASCULAR IMAGING/OCT N/A 05/03/2022   Procedure: INTRAVASCULAR IMAGING/OCT;  Surgeon: Nelva Bush, MD;  Location: [place] [facility];  Service: Cardiovascular;  Laterality: N/A;   LEFT HEART CATH AND CORONARY ANGIOGRAPHY N/A 05/03/2022   Procedure: LEFT HEART CATH AND CORONARY ANGIOGRAPHY;   Surgeon: Nelva Bush, MD;  Location: [place] [facility];  Service: Cardiovascular;  Laterality: N/A;   LEFT HEART CATHETERIZATION WITH CORONARY ANGIOGRAM N/A 12/07/2013   Procedure: LEFT HEART CATHETERIZATION WITH CORONARY ANGIOGRAM;  Surgeon: Peter M Martinique, MD;  Location: [facility];  Service: Cardiovascular;  Laterality: N/A;   left rotator     POLYPECTOMY  12/20/2017   Procedure: POLYPECTOMY;  Surgeon: Rogene Houston, MD;  [facility];  Service: Endoscopy;;  Gastric (HS) x2   RIGHT KNEE SURGERY     ROBOT ASSISTED LAPAROSCOPIC RADICAL PROSTATECTOMY  06/26/2012  Procedure: ROBOTIC ASSISTED LAPAROSCOPIC RADICAL PROSTATECTOMY LEVEL 3;  Surgeon: Dutch Gray, MD;  Location: [facility];  Service: Urology;  Laterality: N/A;       Two cardiac stents  2009   UPPER GASTROINTESTINAL ENDOSCOPY  02/21/2011   EGD ED   UPPER GASTROINTESTINAL ENDOSCOPY  03/03/03   TCS   VASECTOMY       Allergies  Allergen Reactions   Codeine Palpitations   Plavix [Clopidogrel Bisulfate] Rash      Family History  Problem Relation Age of Onset   Heart disease Mother    Breast cancer Mother    Heart disease Father    Breast cancer Sister    Ovarian cancer Sister    Heart disease Brother    Healthy Daughter    Healthy Daughter    Healthy Daughter    Healthy Daughter    Coronary artery disease Other        family history     Social History Mr. Bredeson reports that he quit smoking about 23 years ago. His smoking use included cigarettes. He has a 90.00 pack-year smoking history. He has been exposed to tobacco smoke. He has never used smokeless tobacco. Mr. Sittner reports no history of alcohol use.   Review of Systems CONSTITUTIONAL: No weight loss, fever, chills, weakness or fatigue.  HEENT: Eyes: No visual loss, blurred vision, double vision or yellow sclerae.No hearing loss, sneezing, congestion, runny nose or sore throat.  SKIN: No rash or itching.  CARDIOVASCULAR: per  hpi RESPIRATORY: No shortness of breath, cough or sputum.  GASTROINTESTINAL: No anorexia, nausea, vomiting or diarrhea. No abdominal pain or blood.  GENITOURINARY: No burning on urination, no polyuria NEUROLOGICAL: No headache, dizziness, syncope, paralysis, ataxia, numbness or tingling in the extremities. No change in bowel or bladder control.  MUSCULOSKELETAL: No muscle, back pain, joint pain or stiffness.  LYMPHATICS: No enlarged nodes. No history of splenectomy.  PSYCHIATRIC: No history of depression or anxiety.  ENDOCRINOLOGIC: No reports of sweating, cold or heat intolerance. No polyuria or polydipsia.  Marland Kitchen   Physical Examination Today's Vitals   05/18/22 0946  BP: 130/80  Pulse: 60  SpO2: 97%  Weight: 297 lb 6.4 oz (134.9 kg)  Height: 6' 2"  (1.88 m)   Body mass index is 38.18 kg/m.  Gen: resting comfortably, no acute distress HEENT: no scleral icterus, pupils equal round and reactive, no palptable cervical adenopathy,  CV: RRR, no m/r/ gno jvd Resp: Clear to auscultation bilaterally GI: abdomen is soft, non-tender, non-distended, normal bowel sounds, no hepatosplenomegaly MSK: extremities are warm,trace bilateral edema Skin: warm, no rash Neuro:  no focal deficits Psych: appropriate affect   Diagnostic Studies 02/2007 Cath  FINDINGS: Aortic pressure 131/85 with a mean of 106, left ventricular  pressure is 128/16.  The left mainstem has luminal irregularities but no significant  angiographic stenosis.  The left mainstem bifurcates into the LAD and left circumflex. The LAD  is a large-caliber vessel that courses down and reaches the LV apex.  Proximal portion of the LAD in the area of the first septal perforator  has a diffuse 50% stenosis. There is a diagonal Dashayla Theissen that arises from  this area. The diagonal is widely patent. The remaining portions of  the mid and distal LAD have diffuse nonobstructive plaque without  significant focal stenosis.  The left circumflex  is a large-caliber caliber vessel. It gives off a  large first OM Zaydee Aina that has an 80% stenosis in its proximal portion.  The AV  groove circumflex continues down and provides a small left  posterolateral Kwali Wrinkle. There is no significant angiographic disease in  the AV groove circumflex.  The right coronary artery is diffusely diseased. There are luminal  irregularities in the proximal portion. The midportion in the stented  segment that is ectatic. Distally, there is diffuse disease with 50%  stenosis just before the bifurcation of the PDA and posterior AV  segment. The PDA and posterolateral branches have no significant  angiographic disease  Left ventriculography demonstrates normal LV systolic function with an  LVEF of 60%. There is no mitral regurgitation.  ASSESSMENT:  1. Severe obtuse marginal 1 stenosis.  2. Moderate diffuse disease in the left anterior descending artery and  right coronary artery.  3. Normal left ventricular systolic function.  4. Successful percutaneous coronary intervention of the left  circumflex with a bare-metal stent.  Will continue aspirin indefinitely. The patient should be on  ticlopidine for 30 days with close monitoring of his CBC.    02/2013 Lexiscan MPI  Small partially reversible apical to mid anteroseptal defect. Partially reversible mid anterior defect. Fixed mid to basal inferior wall defect. Normal wall motion, thought to be artifact/noted soft tissue attenuation    11/17/13 clinic EKG  NSR,      11/2013 Cath Procedural Findings:  Hemodynamics:  AO 110/62 mean 79 mm Hg  LV 113/12 mm Hg  Coronary angiography:  Coronary dominance: right  Left mainstem: Normal  Left anterior descending (LAD): 30% disease in the proximal vessel. The first diagonal is normal.  Left circumflex (LCx): The LCx gives rise to a large OM 1, a small OM2, and terminates in a posterolateral Justine Dines. The stent in the OM 1 is widely patent. No other significant disease  in the LCx.  Right coronary artery (RCA): Large vessel. There is 30% disease in the proximal vessel, at the crux, and in the distal RCA.  Left ventriculography: Left ventricular systolic function is normal, LVEF is estimated at 55-65%, there is no significant mitral regurgitation  Final Conclusions:  1. Nonobstructive CAD. The prior stent in OM1 is widely patent.  2. Normal LV function.  Recommendations: Continue medical therapy.    03/2019 echo [facility] Summary   1. Echo contrast utilized to enhance endocardial border definition.   2. The left ventricle is normal in size with mildly increased wall thickness.   3. Normal left ventricular systolic function, ejection fraction 60-65%.   4. Normal right ventricular size and systolic function.   5. The aortic valve is trileaflet with mildly thickened leaflets with normal excursion.   6. Aortic root borderline dilated.   7. Ascending aorta borderline dilated.    Assessment and Plan  1. CAD  - recent LAD stent - since stent had had some progression SOB, LE edema, abdominal distension. Weight gain by home scales though stable by ours. - may be side effects of brillinta. Some resources list peripheral edema as side effects, certaintly can see SOB with brilininta. Had some LE edema at prior visit before brilinta but worst since starting and now new SOB.  - has plavix allergy, change to effient 45m day 1 then 150mdaily after.  - continue lasix, check bmet/mg 2 weeks.    2. Hyperlipidemia  -remains resistant with statin, GI had been ok on there end regarding statin and his liver history - update lipid panel, pending results try referal again to lipid clinic           JoArnoldo Lenis  M.D.

## 2022-05-18 NOTE — Patient Instructions (Addendum)
Medication Instructions:  Stop Brilinta (Ticagrelor) Begin Effient 70m on day 1, then 189mdaily thereafter  Continue all other medications.     Labwork: BMET, FLP - orders given today  Please do in 2 weeks (around 06/01/22) Reminder:  Nothing to eat or drink after 12 midnight prior to labs. Office will contact with results via phone, letter or mychart.     Testing/Procedures: none  Follow-Up: Keep already scheduled visit for 07/17/22 with [place] [facility] cancel the 05/31/22  Any Other Special Instructions Will Be Listed Below (If Applicable). Call the office with update on Monday on your weights.    If you need a refill on your cardiac medications before your next appointment, please call your pharmacy.

## 2022-05-31 ENCOUNTER — Ambulatory Visit: Payer: Medicare Other | Admitting: Student

## 2022-06-01 ENCOUNTER — Other Ambulatory Visit: Payer: Self-pay | Admitting: Cardiology

## 2022-06-02 LAB — BASIC METABOLIC PANEL
BUN/Creatinine Ratio: 14 (ref 10–24)
BUN: 11 mg/dL (ref 8–27)
CO2: 24 mmol/L (ref 20–29)
Calcium: 9.1 mg/dL (ref 8.6–10.2)
Chloride: 101 mmol/L (ref 96–106)
Creatinine, Ser: 0.79 mg/dL (ref 0.76–1.27)
Glucose: 130 mg/dL — ABNORMAL HIGH (ref 70–99)
Potassium: 3.7 mmol/L (ref 3.5–5.2)
Sodium: 143 mmol/L (ref 134–144)
eGFR: 99 mL/min/{1.73_m2} (ref >59)

## 2022-06-02 LAB — LIPID PANEL W/O CHOL/HDL RATIO
Cholesterol, Total: 225 mg/dL — ABNORMAL HIGH (ref 100–199)
HDL: 58 mg/dL (ref >39)
LDL Chol Calc (NIH): 149 mg/dL — ABNORMAL HIGH (ref 0–99)
Triglycerides: 104 mg/dL (ref 0–149)
VLDL Cholesterol Cal: 18 mg/dL (ref 5–40)

## 2022-06-04 ENCOUNTER — Other Ambulatory Visit: Payer: Self-pay | Admitting: *Deleted

## 2022-06-04 DIAGNOSIS — E782 Mixed hyperlipidemia: Secondary | ICD-10-CM

## 2022-06-04 DIAGNOSIS — I2510 Atherosclerotic heart disease of native coronary artery without angina pectoris: Secondary | ICD-10-CM

## 2022-06-04 DIAGNOSIS — I10 Essential (primary) hypertension: Secondary | ICD-10-CM

## 2022-06-05 ENCOUNTER — Telehealth: Payer: Self-pay

## 2022-06-05 DIAGNOSIS — E782 Mixed hyperlipidemia: Secondary | ICD-10-CM

## 2022-06-05 NOTE — Telephone Encounter (Signed)
Spoke to patient who verbalized understanding of results. Patient agreeable to lipid clinic referral. Patient had no questions or concerns at this time. PCP copied.

## 2022-06-05 NOTE — Telephone Encounter (Signed)
-----   Message from Arnoldo Lenis, MD sent at 06/04/2022 10:20 AM EDT ----- Cholesterol is well above goal, if willing please refer to lipid clinic to discuss treatment options given he has been not in favor of statins.   Zandra Abts MD

## 2022-07-09 ENCOUNTER — Other Ambulatory Visit: Payer: Self-pay | Admitting: Cardiology

## 2022-07-15 NOTE — Progress Notes (Unsigned)
Cardiology Office Note:    Date:  07/17/2022   ID:  AUTHOR HATLESTAD, DOB [birth year], MRN [id number]  PCP:  [name], [place] [facility] Cardiologist:  Carlyle Dolly, MD     Referring MD: [name], MD   CC: Here for follow-up  History of Present Illness:    Billy Davis is a 66 y.o. male with a hx of the following:  Coronary artery disease, status post stenting HTN CAD GERD Non-alcoholic cirrhosis E9HB Hx of prostate cancer Obesity  History of prior bare-metal stent to left circumflex in 2008 with noted moderate diffuse disease of LAD and RCA that was treated medically.  EF at that time was 60%.  Lexiscan in 2014 showed multiple defects and normal wall motion although was thought to be mostly artifact, EF at that time was 50%.  Underwent left heart catheterization in April 2015 that showed patent LM, proximal LAD was 30%, patent left circumflex, OM1 with patent stents, proximal RCA 30% disease, EF 55 to 65%.  Presented to [facility] in August 2020 with chest pain that was occurring at rest over the past several weeks.  Nuclear stress test at that time revealed moderate reversibility in inferior and inferoapical wall.  Symptoms were resolved after titrating antianginals, patient deferred catheterization at that time.  His echocardiogram that was performed at [facility] revealed EF 60 to 65%.  Saw Amie Portland, PA-C on April 17, 2022 and reported chest pain lasting 10 minutes, that occurred with or without activity.  Lexi scan was arranged for patient and revealed findings consistent with ischemia, no ST deviation was noted, mild intensity and size apical reversible defect consistent with history of noted.  Study was deemed intermediate risk.  Case and result was discussed with Dr. Carlyle Dolly, and patient underwent left heart cath on 05/03/2022 that revealed multivessel CAD with 60% proximal, 40% mid, and diffuse 60 to 70% distal/apical LAD  disease, 70% D1 stenosis, and multifocal 40 to 60% disease in proximal through distal RCA.Marland Kitchen  Proximal/mid LAD was highly significant by hemodynamic assessment with RFR at 0.79, proximal/mid RCA disease was not significant.  OM 2 stent was patent with widely patent mid RCA stent, successful RFR with PCI to ostial through mid LAD drug-eluting stent, DAPT included aspirin and Brilinta recommended for at least 6 months, given his past history of Plavix allergy.  Medical therapy also recommended given distal/apical LAD disease.   Saw Dr. Carlyle Dolly at the end of September after left heart cath for follow-up and noted 9 to 10 pound weight gain/edema since heart catheterization, stated he felt it was possibly related to Brilinta, also noted abdominal distention and progressing shortness of breath with lower extremity edema since stent was placed.  Even though he reported weight gain by his home scales, his weight seemed to be stable compared to previous office visits as seen in our records.  Was changed to Effient due to Brilinta side effects.  Today he presents for follow-up evaluation. He states the following he has been doing well since last visit.  Denies any chest pain, shortness of breath, or any significant recent weight changes.  Does admit to financial difficulty due to cost of co-pays.  Overall doing well from a cardiac perspective.  Denies any palpitations, dizziness, syncope, presyncope, orthopnea, PND, acute bleeding, or claudication.  States he only takes Lasix as needed and does admit to recent salt intake, does state he does canning with his foods.  Tolerating Effient  well and is requesting a refill.  Denies any bleeding issues.  Denies any other questions or concerns today.   Past Medical History:  Diagnosis Date   Aneurysm ([place])    right femoral pseudoaneurysm, thrombosed (following 03/20/07 cardiac cath)   CAD, multiple vessel    PCI circumflex 2008, diffuse disease of the right and LAD  treated medically.,  //   Nuclear 2010 no ischemia   //   nuclear May, 2012 no ischemia   CHF (congestive heart failure) ([place])    Cirrhosis of liver without mention of alcohol    Contrast media allergy    pt states he is not allergy to contrast   Diabetes mellitus without complication ([place])    type 2   Diverticulosis    Dysphagia    Ejection fraction    EF 55%, nuclear, May, 2012   Esophageal reflux    IBS (irritable bowel syndrome)    Lower abdominal pain    Mixed dyslipidemia    Morbid obesity ([place])    Other chronic nonalcoholic liver disease    Postsurgical percutaneous transluminal coronary angioplasty status    Preop cardiovascular exam    Cardiovascular clearance for prostate surgery October, 2013   Prostate cancer ([place]) 03/24/2012   Renal insufficiency    Unspecified diastolic heart failure    Unspecified essential hypertension     Past Surgical History:  Procedure Laterality Date   BACK SURGERY     took cyst out of back   [place] Bilateral    CHOLECYSTECTOMY  06/1998   COLONOSCOPY  03/03/03   COLONOSCOPY  08/17/2011   Procedure: COLONOSCOPY;  Surgeon: Rogene Houston, MD;  [facility];  Service: Endoscopy;  Laterality: N/A;  1045   COLONOSCOPY N/A 07/05/2017   Procedure: COLONOSCOPY;  Surgeon: Rogene Houston, MD;  [facility];  Service: Endoscopy;  Laterality: N/A;  2:00   CORONARY PRESSURE WIRE/FFR WITH 3D MAPPING N/A 05/03/2022   Procedure: Coronary Pressure Wire/FFR w/3D Mapping;  Surgeon: Nelva Bush, MD;  Location: [place] [facility];  Service: Cardiovascular;  Laterality: N/A;   CORONARY STENT INTERVENTION N/A 05/03/2022   Procedure: CORONARY STENT INTERVENTION;  Surgeon: Nelva Bush, MD;  Location: [place] [facility];  Service: Cardiovascular;  Laterality: N/A;   ESOPHAGOGASTRODUODENOSCOPY (EGD) WITH PROPOFOL N/A 12/20/2017   Procedure: ESOPHAGOGASTRODUODENOSCOPY (EGD) WITH PROPOFOL;   Surgeon: Rogene Houston, MD;  [facility];  Service: Endoscopy;  Laterality: N/A;  11:10 :   INTRAVASCULAR IMAGING/OCT N/A 05/03/2022   Procedure: INTRAVASCULAR IMAGING/OCT;  Surgeon: Nelva Bush, MD;  Location: [place] [facility];  Service: Cardiovascular;  Laterality: N/A;   LEFT HEART CATH AND CORONARY ANGIOGRAPHY N/A 05/03/2022   Procedure: LEFT HEART CATH AND CORONARY ANGIOGRAPHY;  Surgeon: Nelva Bush, MD;  Location: [place] [facility];  Service: Cardiovascular;  Laterality: N/A;   LEFT HEART CATHETERIZATION WITH CORONARY ANGIOGRAM N/A 12/07/2013   Procedure: LEFT HEART CATHETERIZATION WITH CORONARY ANGIOGRAM;  Surgeon: Peter M Martinique, MD;  Location: [facility] CATH LAB;  Service: Cardiovascular;  Laterality: N/A;   left rotator     POLYPECTOMY  12/20/2017   Procedure: POLYPECTOMY;  Surgeon: Rogene Houston, MD;  [facility];  Service: Endoscopy;;  Gastric (HS) x2   RIGHT KNEE SURGERY     ROBOT ASSISTED LAPAROSCOPIC RADICAL PROSTATECTOMY  06/26/2012   Procedure: ROBOTIC ASSISTED LAPAROSCOPIC RADICAL PROSTATECTOMY LEVEL 3;  Surgeon: Dutch Gray, MD;  Location: [facility];  Service: Urology;  Laterality: N/A;       Two cardiac stents  2009   UPPER GASTROINTESTINAL ENDOSCOPY  02/21/2011   EGD ED   UPPER GASTROINTESTINAL ENDOSCOPY  03/03/03   TCS   VASECTOMY      Current Medications: Current Meds  Medication Sig   amLODipine (NORVASC) 10 MG tablet Take 10 mg by mouth daily.    aspirin EC 81 MG tablet Take 1 tablet (81 mg total) by mouth daily.   furosemide (LASIX) 20 MG tablet Take 1 tablet by mouth once daily PRN for edema or weight gain   insulin glargine (LANTUS) 100 UNIT/ML injection Inject 45-50 Units into the skin See admin instructions. 50 units in the morning, and 45 units at bedtime   lisinopril (PRINIVIL,ZESTRIL) 40 MG tablet Take 40 mg by mouth every morning.    Multiple Vitamin (MULTI VITAMIN MENS PO) Take 1 tablet by mouth daily.    nitroGLYCERIN  (NITROSTAT) 0.4 MG SL tablet Place 1 tablet (0.4 mg total) under the tongue every 5 (five) minutes x 3 doses as needed for chest pain (if no relief after 2nd dose, proceed to ED or call 911). For chest pains   pantoprazole (PROTONIX) 40 MG tablet Take 40 mg by mouth daily.   zinc gluconate 50 MG tablet Take 50 mg by mouth daily.    metoprolol tartrate (LOPRESSOR) 50 MG tablet Take 1 tablet (50 mg total) by mouth 2 (two) times daily. (Patient taking differently: Take 50 mg by mouth daily.)    prasugrel (EFFIENT) 10 MG TABS tablet Take 6 tablets (60 mg total) by mouth daily for 1 day, THEN 1 tablet (10 mg total) daily.     Allergies:   Codeine and Plavix [clopidogrel bisulfate]   Social History   Socioeconomic History   Marital status: Married    Spouse name: MARTHA   Number of children: Not on file   Years of education: Not on file   Highest education level: Not on file  Occupational History   Occupation: [employer]  Tobacco Use   Smoking status: Former    Packs/day: 3.00    Years: 30.00    Total pack years: 90.00    Types: Cigarettes    Quit date: 08/27/1998    Years since quitting: 23.9    Passive exposure: Past   Smokeless tobacco: Never   Tobacco comments:    Pt quit smoking 10 yrs ago, smoked for about 30 yrs.  Vaping Use   Vaping Use: Never used  Substance and Sexual Activity   Alcohol use: No    Alcohol/week: 0.0 standard drinks of alcohol   Drug use: No   Sexual activity: Not on file  Other Topics Concern   Not on file  Social History Narrative   Lives in [place], [place]   Works at [employer] Strain: Not on [employer] Insecurity: Not on file  Transportation Needs: Not on file  Physical Activity: Not on file  Stress: Not on file  Social Connections: Not on file     Family History: The patient's family history includes Breast cancer in his mother and sister; Coronary artery disease in an other family  member; Healthy in his daughter, daughter, daughter, and daughter; Heart disease in his brother, father, and mother; Ovarian cancer in his sister.  ROS:   Review of Systems  Constitutional: Negative.   HENT: Negative.  Eyes: Negative.   Respiratory: Negative.    Cardiovascular:  Positive for leg swelling. Negative for chest pain, palpitations, orthopnea, claudication and PND.  Gastrointestinal: Negative.   Genitourinary: Negative.   Musculoskeletal: Negative.   Skin: Negative.   Neurological: Negative.   Endo/Heme/Allergies: Negative.   Psychiatric/Behavioral: Negative.      Please see the history of present illness.    All other systems reviewed and are negative.  EKGs/Labs/Other Studies Reviewed:    The following studies were reviewed today:   EKG:  EKG is not ordered today.   Left heart cath on May 03, 2022: Conclusions: Multivessel coronary artery disease with 60% proximal, 40% mid, and diffuse 60-70% distal/apical LAD disease, 70% D1 stenosis, and multifocal 40-60% disease in the proximal through distal RCA.  Proximal/mid LAD is highly significant by hemodynamic assessment (RFR 0.79).  Proximal/mid RCA disease is not significant (RFR 0.94). Patent OM2 stent with up to 30% in-stent restenosis. Widely patent mid RCA stent. Normal left ventricular filling pressure (LVEDP 10 mmHg). Successful RFR- and OCT-guided PCI to ostial through mid LAD using Synergy 3.0 x 38 mm drug-eluting stent (postdilated up to 3.6 mm) with 0% residual stenosis and TIMI-3 flow.   Recommendations: Dual antiplatelet therapy with aspirin and ticagrelor for at least 6 months, given patient's history of clopidogrel allergy. Aggressive secondary prevention of coronary artery disease.  Favor medical therapy of distal/apical LAD disease given distal location and small vessel size. Anticipate same-day discharge if no complications occurred during 6 hours of post PCI monitoring.    Lexiscan on April 25, 2022:   Findings are consistent with ischemia. The study is intermediate risk.   No ST deviation was noted.   End diastolic cavity size is normal.   Mild intensity and size apical reversible defect consistent with ischemia  2D echocardiogram on April 23, 2022: 1. Left ventricular ejection fraction, by estimation, is 55 to 60%. The  left ventricle has normal function. The left ventricle has no regional  wall motion abnormalities. There is mild left ventricular hypertrophy.  Left ventricular diastolic parameters  are consistent with Grade I diastolic dysfunction (impaired relaxation).   2. Right ventricular systolic function is normal. The right ventricular  size is normal.   3. The mitral valve is normal in structure. No evidence of mitral valve  regurgitation. No evidence of mitral stenosis.   4. The aortic valve is tricuspid. There is mild calcification of the  aortic valve. There is mild thickening of the aortic valve. Aortic valve  regurgitation is not visualized. No aortic stenosis is present.   Comparison(s): Echocardiogram done 12/07/20 showed an EF of 60-65%.  Recent Labs: 09/17/2021: B Natriuretic Peptide 25.0; Magnesium 2.0 04/09/2022: ALT 28; Hemoglobin 14.7; Platelets 135 06/01/2022: BUN 11; Creatinine, Ser 0.79; Potassium 3.7; Sodium 143  Recent Lipid Panel    Component Value Date/Time   CHOL 225 (H) 06/01/2022 0902   TRIG 104 06/01/2022 0902   HDL 58 06/01/2022 0902   LDLCALC 149 (H) 06/01/2022 0902     Risk Assessment/Calculations:      HYPERTENSION CONTROL Vitals:   07/17/22 0922 07/17/22 0935  BP: (!) 156/98 (!) 160/62    The patient's blood pressure is elevated above target today.  In order to address the patient's elevated BP: Blood pressure will be monitored at home to determine if medication changes need to be made.; A current anti-hypertensive medication was adjusted today.; Follow up with general cardiology has been recommended.  Physical Exam:    VS:  BP (!) 160/62 (BP Location: Left Arm, Patient Position: Sitting, Cuff Size: Normal)   Pulse 68   Ht 6' 2"  (1.88 m)   Wt 296 lb (134.3 kg)   SpO2 95%   BMI 38.00 kg/m     Wt Readings from Last 3 Encounters:  07/17/22 296 lb (134.3 kg)  05/18/22 297 lb 6.4 oz (134.9 kg)  05/03/22 282 lb (127.9 kg)     GEN: Obese, 66 year old Caucasian male in no acute distress  HEENT: Normal NECK: No JVD; No carotid bruits CARDIAC: S1/S2, RRR, no murmurs, rubs, gallops; 2+ peripheral pulses throughout, strong and equal bilaterally RESPIRATORY:  Clear and diminished to auscultation without rales, wheezing or rhonchi  MUSCULOSKELETAL: 1+ pitting edema along bilateral lower extremities, otherwise normal; No deformity  SKIN: Warm and dry NEUROLOGIC:  Alert and oriented x 3 PSYCHIATRIC: Flat affect   ASSESSMENT:    1. Coronary artery disease involving native coronary artery of native heart without angina pectoris   2. Hypertension, unspecified type   3. Hyperlipidemia, unspecified hyperlipidemia type   4. Lower leg edema   5. Class 2 severe obesity due to excess calories with serious comorbidity and body mass index (BMI) of 38.0 to 38.9 in adult ([place])   6. Type 2 diabetes mellitus with hyperglycemia, with long-term current use of insulin (HCC)    PLAN:    In order of problems listed above:  Coronary artery disease, status post stenting with most recent PCI/drug-eluting stent to ostial through mid LAD in September 2023 History of previous stents with most recent PCI/DES to ostial through mid LAD and 04/2022.  Was unable to tolerate Brilinta and this was switched to Effient at last office visit.  He is currently tolerating Effient well and denies any bleeding issues and we will refill this for him today.  Continue aspirin, lisinopril, Lopressor, nitroglycerin as needed, and Effient. Heart healthy diet and regular cardiovascular exercise encouraged.   2.   Hypertension Initial BP, 156/98 and repeat BP 160/62.  He does admit to recent salt intake.  Will increase metoprolol to tartrate from 50 mg twice daily to 75 mg twice daily. Discussed to monitor HR and BP at home at least 2 hours after medications and sitting for 5-10 minutes and given BP log today in office.  Recommended Omron cuff.  Low salt, heart healthy diet and regular cardiovascular exercise encouraged.  Patient was given salty 6 diet information sheet.  He will call the office and let us know if his BP readings continually remain elevated with the top number remaining greater than 140- 150 within the next few weeks.  Continue amlodipine and lisinopril PRN, and Lasix as needed.  If blood pressure is not at goal after this medication adjustment, plan to stop lisinopril and start Aldactone/chlorthalidone.  3.  Hyperlipidemia Labs from October 2023 revealed total cholesterol 225, HDL 58, LDL 149, and triglycerides 104.  Would like to see his LDL less than 70.  I had discussion with him regarding lipid-lowering medications and lipid clinic referral.  He politely declines starting on any lipid-lowering medications as well as lipid clinic referral.  I discussed with him the risk of elevated cholesterol level including plaque buildup, coronary/arterial blockages, including carotid artery disease or peripheral arterial disease as well as risk to his coronary artery disease.  He verbalized understanding and continued to decline medication therapy and lipid clinic referral. Heart healthy diet and regular cardiovascular exercise encouraged.   4.  Lower extremity edema Minimal, +1 pitting edema along bilateral lower extremities.  Does admit to recent salt intake.  Continue Lasix 20 mg daily as needed.  Low-salt/heart healthy diet recommended with regular physical activity.  5.  Class II obesity BMI today 38. Weight loss via diet and exercise encouraged. Discussed the impact being overweight would have on  cardiovascular risk. Heart healthy diet and regular cardiovascular exercise encouraged.   6.  Type 2 diabetes A1c in August 2023 was 7.8%.  This is currently being managed by PCP.  Continue Lantus. Continue to follow with PCP. Heart healthy/diabetic diet and regular cardiovascular exercise encouraged.   7.  Disposition: Follow-up with me or Dr. Carlyle Dolly in 6 months or sooner if anything changes.  Will route this note over to social worker, Scarlette Calico. Tobe Sos, to help provide patient any assistance she can regarding financial concerns due to co-pays.  Medication Adjustments/Labs and Tests Ordered: Current medicines are reviewed at length with the patient today.  Concerns regarding medicines are outlined above.  No orders of the defined types were placed in this encounter.  Meds ordered this encounter  Medications   metoprolol tartrate (LOPRESSOR) 50 MG tablet    Sig: Take 1.5 tablets (75 mg total) by mouth 2 (two) times daily.    Dispense:  270 tablet    Refill:  1    DOSE INCREASE 07/17/22   prasugrel (EFFIENT) 10 MG TABS tablet    Sig: Take 1 tablet (10 mg total) by mouth daily.    Dispense:  90 tablet    Refill:  1    Stopping Brilinta, med changed 05/18/22    Patient Instructions  Medication Instructions:  Your physician has recommended you make the following change in your medication:  Increase metformin to 75 mg twice a day Continue all other medications as directed  Labwork: none  Testing/Procedures: none  Follow-Up:  Your physician recommends that you schedule a follow-up appointment in: 6 months  Any Other Special Instructions Will Be Listed Below (If Applicable).  Call the office in 2 weeks with BP readings Nurse Visit in 6 weeks for BP check. Please bring BP log to that visit.   If you need a refill on your cardiac medications before your next appointment, please call your pharmacy.    SignedFinis Bud, NP  07/17/2022 12:52 PM    [place]

## 2022-07-17 ENCOUNTER — Encounter: Payer: Self-pay | Admitting: Nurse Practitioner

## 2022-07-17 ENCOUNTER — Ambulatory Visit: Payer: Medicare Other | Attending: Nurse Practitioner | Admitting: Nurse Practitioner

## 2022-07-17 VITALS — BP 160/62 | HR 68 | Ht 74.0 in | Wt 296.0 lb

## 2022-07-17 DIAGNOSIS — I2510 Atherosclerotic heart disease of native coronary artery without angina pectoris: Secondary | ICD-10-CM | POA: Diagnosis not present

## 2022-07-17 DIAGNOSIS — Z794 Long term (current) use of insulin: Secondary | ICD-10-CM

## 2022-07-17 DIAGNOSIS — E785 Hyperlipidemia, unspecified: Secondary | ICD-10-CM | POA: Diagnosis not present

## 2022-07-17 DIAGNOSIS — R600 Localized edema: Secondary | ICD-10-CM | POA: Diagnosis not present

## 2022-07-17 DIAGNOSIS — E1165 Type 2 diabetes mellitus with hyperglycemia: Secondary | ICD-10-CM

## 2022-07-17 DIAGNOSIS — I10 Essential (primary) hypertension: Secondary | ICD-10-CM

## 2022-07-17 DIAGNOSIS — Z6838 Body mass index (BMI) 38.0-38.9, adult: Secondary | ICD-10-CM

## 2022-07-17 MED ORDER — PRASUGREL HCL 10 MG PO TABS: 10.0000 mg | ORAL_TABLET | Freq: Every day | ORAL | 1 refills | Status: AC

## 2022-07-17 MED ORDER — METOPROLOL TARTRATE 50 MG PO TABS: 75.0000 mg | ORAL_TABLET | Freq: Two times a day (BID) | ORAL | 1 refills | Status: DC

## 2022-07-17 NOTE — Patient Instructions (Addendum)
Medication Instructions:  Your physician has recommended you make the following change in your medication:  Increase metformin to 75 mg twice a day Continue all other medications as directed  Labwork: none  Testing/Procedures: none  Follow-Up:  Your physician recommends that you schedule a follow-up appointment in: 6 months  Any Other Special Instructions Will Be Listed Below (If Applicable).  Call the office in 2 weeks with BP readings Nurse Visit in 6 weeks for BP check. Please bring BP log to that visit.   If you need a refill on your cardiac medications before your next appointment, please call your pharmacy.

## 2022-08-01 ENCOUNTER — Telehealth: Payer: Self-pay | Admitting: Cardiology

## 2022-08-01 ENCOUNTER — Encounter: Payer: Self-pay | Admitting: Cardiology

## 2022-08-01 DIAGNOSIS — Z79899 Other long term (current) drug therapy: Secondary | ICD-10-CM

## 2022-08-01 NOTE — Telephone Encounter (Signed)
Sent BP readings to Dr. Harl Bowie- pt stated the change in the medication did not help.

## 2022-08-02 NOTE — Telephone Encounter (Signed)
BP's too high, please add on aldactone 44m daily to current regimen, needs repeat bmet 2 weeks  JZandra AbtsMD

## 2022-08-03 MED ORDER — SPIRONOLACTONE 25 MG PO TABS: 25.0000 mg | ORAL_TABLET | Freq: Every day | ORAL | 3 refills | Status: DC

## 2022-08-03 NOTE — Telephone Encounter (Signed)
Patient notified and verbalized understanding. 

## 2022-09-05 ENCOUNTER — Telehealth: Payer: Self-pay | Admitting: *Deleted

## 2022-09-05 ENCOUNTER — Encounter: Payer: Self-pay | Admitting: *Deleted

## 2022-09-05 MED ORDER — METOPROLOL TARTRATE 50 MG PO TABS: 50.0000 mg | ORAL_TABLET | Freq: Two times a day (BID) | ORAL | Status: DC

## 2022-09-05 NOTE — Telephone Encounter (Signed)
-----   Message from Arnoldo Lenis, MD sent at 08/31/2022 11:09 AM EST ----- Don't see that he went for labs, was to have 2 weeks after starting aldactone in early December. Any updated bp's  Zandra Abts MD

## 2022-09-05 NOTE — Telephone Encounter (Signed)
Spoke with patient in regards to BP readings, medications & labs.   See updated medication list - is only doing the Lopressor '50mg'$  - one tab twice a day & only took the Aldactone x 2 days then stopped.  States that he had been on this several years ago & Dr. Laural Golden had told him to stop due to his kidney/liver function.  So, he stopped based on that - stating that his wife found bottle of this in cabinet with note on "not to take".    States that he has been seeing [facility] (Dr. 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  I will request most recent labs & office note.   BP readings below:    08/05/22 - 135/83  73 08/06/22 - 157/93  81 08/07/22 - 151/93  63 08/08/22 - 152/95  64 08/09/22 - 154/98  72 08/11/22 - 146/96  83 08/15/22 - 147/80  64 08/16/22 - 154/94  79 08/17/22 - 148/97  76

## 2022-09-13 NOTE — Telephone Encounter (Signed)
Notes received from [facility] (see scanned into epic) - will forward to provider for review and advice on BP readings.

## 2022-09-18 ENCOUNTER — Encounter (INDEPENDENT_AMBULATORY_CARE_PROVIDER_SITE_OTHER): Payer: Self-pay | Admitting: Gastroenterology

## 2022-10-01 NOTE — Telephone Encounter (Signed)
Can he update [place] on bp's since stopped aldactone. Likely start hydralazine pending his bp's  Zandra Abts MD

## 2022-10-02 NOTE — Telephone Encounter (Signed)
Left message to return call.  

## 2022-10-09 NOTE — Telephone Encounter (Signed)
Reached patient this morning - was able to get his most recent BP readings.  See below -   08/15/22 - 4:00 pm - 147/80  64 08/16/22 - 9:00 am - 154/94  79 08/17/22 - 12:50 am - 148/97  76 09/24/22 - 4:30 pm - 154/78 09/27/22 - 5:15 pm - 145/90  75 09/28/22 - 7:20 pm - 148/84 09/29/22 - 6:30 pm - 152/96 09/30/22 - 9:10 am - 151/72 10/01/22 - 6:20 am - 135/81 10/02/22 - 6:25 pm - 150/70 10/03/22 - 8:20 am - 141/90 10/04/22 - 9:30 am - 163/111  81

## 2022-10-09 NOTE — Telephone Encounter (Signed)
Can he start hydralazine 8m bid and update [place] bp's next week   JZandra AbtsMD

## 2022-10-11 ENCOUNTER — Ambulatory Visit (INDEPENDENT_AMBULATORY_CARE_PROVIDER_SITE_OTHER): Payer: Medicare Other | Admitting: Gastroenterology

## 2022-10-11 MED ORDER — HYDRALAZINE HCL 25 MG PO TABS: 25.0000 mg | ORAL_TABLET | Freq: Two times a day (BID) | ORAL | 6 refills | Status: DC

## 2022-10-11 NOTE — Telephone Encounter (Signed)
Patient notified and verbalized understanding.  Per Dr. Harl Bowie - do not need any labs as Hydralazine would not have any effect on the kidneys.  New medication sent to [facility] now.

## 2022-10-23 ENCOUNTER — Other Ambulatory Visit (INDEPENDENT_AMBULATORY_CARE_PROVIDER_SITE_OTHER): Payer: Self-pay | Admitting: Gastroenterology

## 2022-10-23 ENCOUNTER — Telehealth (INDEPENDENT_AMBULATORY_CARE_PROVIDER_SITE_OTHER): Payer: Self-pay | Admitting: *Deleted

## 2022-10-23 ENCOUNTER — Encounter (INDEPENDENT_AMBULATORY_CARE_PROVIDER_SITE_OTHER): Payer: Self-pay | Admitting: Gastroenterology

## 2022-10-23 ENCOUNTER — Ambulatory Visit (INDEPENDENT_AMBULATORY_CARE_PROVIDER_SITE_OTHER): Payer: Medicare Other | Admitting: Gastroenterology

## 2022-10-23 VITALS — BP 158/102 | HR 85 | Temp 98.8°F | Ht 74.0 in | Wt 297.8 lb

## 2022-10-23 DIAGNOSIS — I8500 Esophageal varices without bleeding: Secondary | ICD-10-CM | POA: Diagnosis not present

## 2022-10-23 DIAGNOSIS — K7460 Unspecified cirrhosis of liver: Secondary | ICD-10-CM

## 2022-10-23 NOTE — Patient Instructions (Addendum)
We will update your liver labs and US of the liver as well as get you scheduled for EGD If you decide you would like to discuss your GI symptoms that you wanted to have a colonoscopy for, please give me a call as I am here to help and provide care to you.  - Reduce salt intake to <2 g per day - Can take Tylenol max of 2 g per day (650 mg q8h) for pain -limit water intake to no more than 2L/day - Avoid NSAIDs for pain - Avoid eating raw oysters/shellfish - Ensure every night before going to sleep  Follow up 6 months

## 2022-10-23 NOTE — Telephone Encounter (Signed)
Called pt, aware of [place] appt details. Attempted to schedule EGD with Dr. Jenetta Downer, room 3 but he is wanting 4/9 in AM. Aware will call back with April schedule once we have this.

## 2022-10-23 NOTE — Progress Notes (Unsigned)
Referring Provider: [name], MD Primary Care Physician:  [name], MD Primary GI Physician: Jenetta Downer   Chief Complaint  Patient presents with   Cirrhosis    Follow up on Cirrhosis, gerd, IBS. Takes pantoprazole for reflux and reports it works most of the time.    HPI:   Billy Davis is a 66 y.o. male with past medical history of NASH cirrhosis, non bleeding G1 EV, CAD, DM, GERD, HLD, obesity, HTN  Patient presenting today for for follow up of Cirrhosis, GERD, IBS.  Last seen August 2023, at that time, had some swelling in his legs despite diuretics, drinking 300 oz water per day and eating diet high in salt.   Recommended to update EGD, check MELD labs, AFP, INR, CBC   EGD scheduled in September was cancelled, Lost to follow up for this  Present:  Patient states he is taking lasix '20mg'$  daily, he is taking another diuretic as needed, every few days. Taking this for the past 5-6 months. He notes that he cannot take the second diuretic every day as he will urinate on himself. Has continued swelling in his legs, when asked if it improves with elevation he reported he does not have time to elevate legs. Denies any abdominal pain. No episodes of confusion. Denies swelling to the abdomen.    Patient tells me he wants to schedule EGD that he cancelled previously but wants to do this on April 9th. He also stated he wanted a colonoscopy at the same time. Discussed his last TCS was in 2018, not due til 2028 for screening. When asked what symptoms he was having in regards to feeling he needed a colonoscopy, patient stated "I just need one, that's why," patient became notably agitated.  Discussed that I cannot order a test without cause but I am happy to discuss his symptoms to determine what evaluation is needed. Patient stated "that's fine, dont worry about it, If you are not going to order it then just drop it!" I again inquired if the patient was having abdominal pain, weight loss,  changes in bowel habits, rectal bleeding, to which he denied. Patient refused to provide any further information.    Cirrhosis related questions: Hematemesis/coffee ground emesis: No History of variceal bleeding: No Abdominal pain: No Abdominal distention/worsening ascitesNo Fever/chills: No Episodes of confusion/disorientation: No Taking diuretics?: lasix '20mg'$  daily, reportedly taking another PRN but unsure of the name Prior history of banding?: No Prior episodes of SBP: No Last time liver imaging was performed: July 2023  no masses, doppler normal  MELD score: 03/2022  Last EGD: 2019 - Normal proximal esophagus and mid esophagus. - Grade I esophageal varices. - Z-line irregular, 43 cm from the incisors. - Portal hypertensive gastropathy. - Two gastric polyps. Resected and retrieved. Clip (MR conditional) was placed. - A single gastric polyp. Resected and retrieved. Path hyperplastic polyp with granulation tissue. - Normal duodenal bulb and second portion of the duodenum.   Last Colonoscopy: 2018 - Diverticulosis in the sigmoid colon and in the cecum. - Internal hemorrhoids   Past Medical History:  Diagnosis Date   Aneurysm ([place])    right femoral pseudoaneurysm, thrombosed (following 03/20/07 cardiac cath)   CAD, multiple vessel    PCI circumflex 2008, diffuse disease of the right and LAD treated medically.,  //   Nuclear 2010 no ischemia   //   nuclear May, 2012 no ischemia   CHF (congestive heart failure) (HCC)    Cirrhosis of liver without  mention of alcohol    Contrast media allergy    pt states he is not allergy to contrast   Diabetes mellitus without complication ([place])    type 2   Diverticulosis    Dysphagia    Ejection fraction    EF 55%, nuclear, May, 2012   Esophageal reflux    IBS (irritable bowel syndrome)    Lower abdominal pain    Mixed dyslipidemia    Morbid obesity ([place])    Other chronic nonalcoholic liver disease    Postsurgical percutaneous  transluminal coronary angioplasty status    Preop cardiovascular exam    Cardiovascular clearance for prostate surgery October, 2013   Prostate cancer ([place]) 03/24/2012   Renal insufficiency    Unspecified diastolic heart failure    Unspecified essential hypertension     Past Surgical History:  Procedure Laterality Date   BACK SURGERY     took cyst out of back   CARDIAC CATHETERIZATION     CARPAL TUNNEL RELEASE Bilateral    CHOLECYSTECTOMY  06/1998   COLONOSCOPY  03/03/03   COLONOSCOPY  08/17/2011   Procedure: COLONOSCOPY;  Surgeon: Rogene Houston, MD;  [facility];  Service: Endoscopy;  Laterality: N/A;  1045   COLONOSCOPY N/A 07/05/2017   Procedure: COLONOSCOPY;  Surgeon: Rogene Houston, MD;  [facility];  Service: Endoscopy;  Laterality: N/A;  2:00   CORONARY PRESSURE WIRE/FFR WITH 3D MAPPING N/A 05/03/2022   Procedure: Coronary Pressure Wire/FFR w/3D Mapping;  Surgeon: Nelva Bush, MD;  Location: [facility];  Service: Cardiovascular;  Laterality: N/A;   CORONARY STENT INTERVENTION N/A 05/03/2022   Procedure: CORONARY STENT INTERVENTION;  Surgeon: Nelva Bush, MD;  Location: [facility];  Service: Cardiovascular;  Laterality: N/A;   ESOPHAGOGASTRODUODENOSCOPY (EGD) WITH PROPOFOL N/A 12/20/2017   Procedure: ESOPHAGOGASTRODUODENOSCOPY (EGD) WITH PROPOFOL;  Surgeon: Rogene Houston, MD;  [facility];  Service: Endoscopy;  Laterality: N/A;  11:10 :   INTRAVASCULAR IMAGING/OCT N/A 05/03/2022   Procedure: INTRAVASCULAR IMAGING/OCT;  Surgeon: Nelva Bush, MD;  Location: [facility];  Service: Cardiovascular;  Laterality: N/A;   LEFT HEART CATH AND CORONARY ANGIOGRAPHY N/A 05/03/2022   Procedure: LEFT HEART CATH AND CORONARY ANGIOGRAPHY;  Surgeon: Nelva Bush, MD;  Location: [facility];  Service: Cardiovascular;  Laterality: N/A;   LEFT HEART CATHETERIZATION WITH CORONARY ANGIOGRAM N/A 12/07/2013   Procedure:  LEFT HEART CATHETERIZATION WITH CORONARY ANGIOGRAM;  Surgeon: Peter M Martinique, MD;  Location: [facility];  Service: Cardiovascular;  Laterality: N/A;   left rotator     POLYPECTOMY  12/20/2017   Procedure: POLYPECTOMY;  Surgeon: Rogene Houston, MD;  [facility];  Service: Endoscopy;;  Gastric (HS) x2   RIGHT KNEE SURGERY     ROBOT ASSISTED LAPAROSCOPIC RADICAL PROSTATECTOMY  06/26/2012   Procedure: ROBOTIC ASSISTED LAPAROSCOPIC RADICAL PROSTATECTOMY LEVEL 3;  Surgeon: Dutch Gray, MD;  Location: [facility];  Service: Urology;  Laterality: N/A;       Two cardiac stents  2009   UPPER GASTROINTESTINAL ENDOSCOPY  02/21/2011   EGD ED   UPPER GASTROINTESTINAL ENDOSCOPY  03/03/03   TCS   VASECTOMY      Current Outpatient Medications  Medication Sig Dispense Refill   amLODipine (NORVASC) 10 MG tablet Take 10 mg by mouth daily.      aspirin EC 81 MG tablet Take 1 tablet (81 mg total) by mouth daily.     furosemide (  LASIX) 20 MG tablet Take 1 tablet by mouth once daily 30 tablet 6   hydrALAZINE (APRESOLINE) 25 MG tablet Take 1 tablet (25 mg total) by mouth 2 (two) times daily. 60 tablet 6   insulin glargine (LANTUS) 100 UNIT/ML injection Inject 45-50 Units into the skin See admin instructions. 50 units in the morning, and 45 units at bedtime     lisinopril (PRINIVIL,ZESTRIL) 40 MG tablet Take 40 mg by mouth every morning.      metoprolol tartrate (LOPRESSOR) 50 MG tablet Take 1 tablet (50 mg total) by mouth 2 (two) times daily.     Multiple Vitamin (MULTI VITAMIN MENS PO) Take 1 tablet by mouth daily.      nitroGLYCERIN (NITROSTAT) 0.4 MG SL tablet Place 1 tablet (0.4 mg total) under the tongue every 5 (five) minutes x 3 doses as needed for chest pain (if no relief after 2nd dose, proceed to ED or call 911). For chest pains 25 tablet 3   pantoprazole (PROTONIX) 40 MG tablet Take 40 mg by mouth daily.     prasugrel (EFFIENT) 10 MG TABS tablet Take 1 tablet (10 mg total) by mouth daily. 90  tablet 1   zinc gluconate 50 MG tablet Take 50 mg by mouth daily.     No current facility-administered medications for this visit.    Allergies as of 10/23/2022 - Review Complete 10/23/2022  Allergen Reaction Noted   Codeine Palpitations 04/10/2011   Plavix [clopidogrel bisulfate] Rash 04/24/2016    Family History  Problem Relation Age of Onset   Heart disease Mother    Breast cancer Mother    Heart disease Father    Breast cancer Sister    Ovarian cancer Sister    Heart disease Brother    Healthy Daughter    Healthy Daughter    Healthy Daughter    Healthy Daughter    Coronary artery disease Other        family history    Social History   Socioeconomic History   Marital status: Married    Spouse name: MARTHA   Number of children: Not on file   Years of education: Not on file   Highest education level: Not on file  Occupational History   Occupation: [employer]  Tobacco Use   Smoking status: Former    Packs/day: 3.00    Years: 30.00    Total pack years: 90.00    Types: Cigarettes    Quit date: 08/27/1998    Years since quitting: 24.1    Passive exposure: Past   Smokeless tobacco: Never   Tobacco comments:    Pt quit smoking 10 yrs ago, smoked for about 30 yrs.  Vaping Use   Vaping Use: Never used  Substance and Sexual Activity   Alcohol use: No    Alcohol/week: 0.0 standard drinks of alcohol   Drug use: No   Sexual activity: Not on file  Other Topics Concern   Not on file  Social History Narrative   Lives in [place], [place]   Works at [employer] Strain: Not on [employer] Insecurity: Not on file  Transportation Needs: Not on file  Physical Activity: Not on file  Stress: Not on file  Social Connections: Not on file    Review of systems General: negative for malaise, night sweats, fever, chills, weight loss Neck: Negative for lumps, goiter, pain and significant neck swelling Resp: Negative  for cough,  wheezing, dyspnea at rest CV: Negative for chest pain, palpitations, orthopnea +leg swelling  GI: denies melena, hematochezia, nausea, vomiting, diarrhea, constipation, dysphagia, odyonophagia, early satiety or unintentional weight loss.  MSK: Negative for joint pain or swelling, back pain, and muscle pain. Derm: Negative for itching or rash Psych: Denies depression, anxiety, memory loss, confusion. No homicidal or suicidal ideation.  Heme: Negative for prolonged bleeding, bruising easily, and swollen nodes. Endocrine: Negative for cold or heat intolerance, polyuria, polydipsia and goiter. Neuro: negative for tremor, gait imbalance, syncope and seizures. The remainder of the review of systems is noncontributory.  Physical Exam: BP (!) 158/102 (BP Location: Right Arm, Patient Position: Sitting, Cuff Size: Large)   Pulse 85   Temp 98.8 F (37.1 C) (Oral)   Ht '6\' 2"'$  (1.88 m)   Wt 297 lb 12.8 oz (135.1 kg)   BMI 38.24 kg/m  General:   Alert and oriented. No distress noted. Agitated.  Head:  Normocephalic and atraumatic. Eyes:  Conjuctiva clear without scleral icterus. Mouth:  Oral mucosa pink and moist. Good dentition. No lesions. Heart: Normal rate and rhythm, s1 and s2 heart sounds present.  Lungs: Clear lung sounds in all lobes. Respirations equal and unlabored. Abdomen:  +BS, soft, non-tender and non-distended. No rebound or guarding. No HSM or masses noted. Derm: No palmar erythema or jaundice Msk:  Symmetrical without gross deformities. Normal posture. Extremities:  Without edema. Neurologic:  Alert and  oriented x4 Psych:  Alert, Agitated, defensive.   Invalid input(s): "6 MONTHS"   ASSESSMENT: JERVIS INGOGLIA is a 67 y.o. male presenting today for follow up of Cirrhosis.     PLAN:  RUQ [place]  2. Schedule EGD   3. MELD labs, CBC, AFP, INR  4.    Follow Up: ***  Thos Matsumoto L. Alver Sorrow, MSN, APRN, AGNP-C Adult-Gerontology Nurse Practitioner [facility]

## 2022-10-24 LAB — COMPREHENSIVE METABOLIC PANEL
ALT: 29 IU/L (ref 0–44)
AST: 23 IU/L (ref 0–40)
Albumin/Globulin Ratio: 1.9 (ref 1.2–2.2)
Albumin: 4.5 g/dL (ref 3.9–4.9)
Alkaline Phosphatase: 90 IU/L (ref 44–121)
BUN/Creatinine Ratio: 15 (ref 10–24)
BUN: 17 mg/dL (ref 8–27)
Bilirubin Total: 0.9 mg/dL (ref 0.0–1.2)
CO2: 24 mmol/L (ref 20–29)
Calcium: 9.3 mg/dL (ref 8.6–10.2)
Chloride: 103 mmol/L (ref 96–106)
Creatinine, Ser: 1.12 mg/dL (ref 0.76–1.27)
Globulin, Total: 2.4 g/dL (ref 1.5–4.5)
Glucose: 246 mg/dL — ABNORMAL HIGH (ref 70–99)
Potassium: 4.2 mmol/L (ref 3.5–5.2)
Sodium: 143 mmol/L (ref 134–144)
Total Protein: 6.9 g/dL (ref 6.0–8.5)
eGFR: 73 mL/min/{1.73_m2} (ref >59)

## 2022-10-24 LAB — CBC/DIFF AMBIGUOUS DEFAULT
Basophils Absolute: 0.1 10*3/uL (ref 0.0–0.2)
Basos: 1 %
EOS (ABSOLUTE): 0.1 10*3/uL (ref 0.0–0.4)
Eos: 2 %
Hematocrit: 44.2 % (ref 37.5–51.0)
Hemoglobin: 14.9 g/dL (ref 13.0–17.7)
Immature Grans (Abs): 0 10*3/uL (ref 0.0–0.1)
Immature Granulocytes: 0 %
Lymphocytes Absolute: 1.4 10*3/uL (ref 0.7–3.1)
Lymphs: 20 %
MCH: 27.6 pg (ref 26.6–33.0)
MCHC: 33.7 g/dL (ref 31.5–35.7)
MCV: 82 fL (ref 79–97)
Monocytes Absolute: 0.4 10*3/uL (ref 0.1–0.9)
Monocytes: 6 %
Neutrophils Absolute: 4.9 10*3/uL (ref 1.4–7.0)
Neutrophils: 71 %
Platelets: 145 10*3/uL — ABNORMAL LOW (ref 150–450)
RBC: 5.4 x10E6/uL (ref 4.14–5.80)
RDW: 13.6 % (ref 11.6–15.4)
WBC: 6.9 10*3/uL (ref 3.4–10.8)

## 2022-10-24 LAB — AFP TUMOR MARKER: AFP, Serum, Tumor Marker: <1.8 ng/mL (ref 0.0–8.4)

## 2022-10-24 LAB — SPECIMEN STATUS REPORT

## 2022-10-24 LAB — PROTIME-INR
INR: 1.1 (ref 0.9–1.2)
Prothrombin Time: 11.9 s (ref 9.1–12.0)

## 2022-10-24 NOTE — Addendum Note (Signed)
Addended by: Harvel Quale on: 10/24/2022 03:42 PM   Modules accepted: Level of Service

## 2022-11-01 ENCOUNTER — Ambulatory Visit (HOSPITAL_COMMUNITY)
Admission: RE | Admit: 2022-11-01 | Discharge: 2022-11-01 | Disposition: A | Discharge status: Home / Self Care | Payer: Medicare Other | Source: Ambulatory Visit | Attending: Gastroenterology | Admitting: Gastroenterology

## 2022-11-01 DIAGNOSIS — K7460 Unspecified cirrhosis of liver: Secondary | ICD-10-CM | POA: Diagnosis present

## 2022-11-01 NOTE — Telephone Encounter (Signed)
Pt contacted and EGD scheduled for 12/04/22. Instructions mailed to patient.   PA completed via [facility]- Notification or Prior Authorization is not required for the requested services You are not required to submit a notification/prior authorization based on the information provided. If you have general questions about the prior authorization requirements, visit [email or web address] > Clinician Resources > Advance and Admission Notification Requirements. The number above acknowledges your notification. Please write this reference number down for future reference. If you would like to request an organization determination, please call us at [phone]. Decision ID #: [id number]

## 2022-11-05 NOTE — Telephone Encounter (Signed)
Pt contacted and made aware of pre op appt 12/03/22 in person at 9:00 am at [facility]

## 2022-11-28 ENCOUNTER — Telehealth: Payer: Self-pay | Admitting: *Deleted

## 2022-11-28 NOTE — Telephone Encounter (Signed)
Rebeca Allegra S, CMA This patient called and stated he had called the office to cancel his procedure on 4/9.  I have canceled his procedure.  Thanks,

## 2022-11-29 NOTE — Telephone Encounter (Signed)
Pt said they he would have to call back to reschedule due to not having the co-pay for the procedure. FYI

## 2022-11-29 NOTE — Telephone Encounter (Signed)
Thanks for the update

## 2022-12-03 ENCOUNTER — Encounter (HOSPITAL_COMMUNITY): Admission: RE | Admit: 2022-12-03 | Payer: Medicare Other | Source: Ambulatory Visit

## 2022-12-04 ENCOUNTER — Ambulatory Visit (HOSPITAL_COMMUNITY): Admission: RE | Admit: 2022-12-04 | Payer: Medicare Other | Source: Home / Self Care | Admitting: Gastroenterology

## 2022-12-04 ENCOUNTER — Encounter (HOSPITAL_COMMUNITY): Admission: RE | Payer: Self-pay | Source: Home / Self Care

## 2022-12-04 SURGERY — ESOPHAGOGASTRODUODENOSCOPY (EGD) WITH PROPOFOL
Anesthesia: Monitor Anesthesia Care

## 2023-01-30 ENCOUNTER — Ambulatory Visit: Payer: Medicare Other | Attending: Cardiology | Admitting: Cardiology

## 2023-01-30 ENCOUNTER — Encounter: Payer: Self-pay | Admitting: Cardiology

## 2023-01-30 VITALS — BP 164/90 | HR 61 | Ht 74.0 in | Wt 305.8 lb

## 2023-01-30 DIAGNOSIS — I2510 Atherosclerotic heart disease of native coronary artery without angina pectoris: Secondary | ICD-10-CM | POA: Diagnosis not present

## 2023-01-30 DIAGNOSIS — I10 Essential (primary) hypertension: Secondary | ICD-10-CM | POA: Diagnosis not present

## 2023-01-30 DIAGNOSIS — E782 Mixed hyperlipidemia: Secondary | ICD-10-CM | POA: Diagnosis not present

## 2023-01-30 DIAGNOSIS — Z79899 Other long term (current) drug therapy: Secondary | ICD-10-CM | POA: Diagnosis not present

## 2023-01-30 MED ORDER — PRASUGREL HCL 10 MG PO TABS: 10.0000 mg | ORAL_TABLET | Freq: Every day | ORAL | 0 refills | Status: DC

## 2023-01-30 MED ORDER — CARVEDILOL 12.5 MG PO TABS: 12.5000 mg | ORAL_TABLET | Freq: Two times a day (BID) | ORAL | 3 refills | Status: DC

## 2023-01-30 NOTE — Progress Notes (Signed)
Clinical Summary Mr. Timmermann is a 67 y.o.male seen today as a new patient for the following medical problems.    1. CAD  - prior BMS to LCX in 2008, with noted moderate diffuse disease of LAD and RCA that has been treated medically. LVEF by LV gram 60% at that time  - Lexiscan MPI 02/2013 with multiple defects and normal wall motion thought most likely artifact. LVEF 50%.  - cath 12/07/2013 LM patent, LAD 30% prox, LCX patent, OM1 with patent stent, RCA 30% prox disease, LVEF 55-65% by LV gram     From [facility] notes presented to [facility] on 04/25/19 with chest pain occurring at rest over the prior week.  - 04/2019 nuclear Stress [facility]: moderate reversibility inferoapical and inferior wall.  - antianginals were titrated and symptoms resolved. He did not want to pursue cath.  03/2019 echo [facility]: LVEF 60-65%        03/2022 echo: LVEF 55-60%, no WMAs, grade Idd.  03/2022 nuclear stress: apical ischemia 04/2022 echo: prox LAD to mid 60% and distal 65% with FFR 0.79, D1 70%, LCX patent, OM2 30%, RCA 60%. Received DES to LAD   - reported weight gain 9-10 lbs, edema since cath.He feels may be related to brilinta - has plavix allergy with rash    - no chest pains. No SOB/DOE - effient not showing on his med list but reports taking. We had switched from brillinta due to reported side effects, he also has plavix allergy    2. Hyperlipidemia  - 11/ 2020 TC 190 TG 90 HDL 52 LDL 122 05/2022 TC 161 TG 096 HDL 58 LDL 149 - started 05/2019 atorva 10mg  x 7month,stopped - reluctant to take statin   Addendum: heard back from Dr Karilyn Cota, he would be ok with starting a statin. We will make patient aware and see if this influences his decision to start.    - remains not interested in statin - discussed again referral to lipid clinic, he is willing         3. HTN  - does not check at home  - compliant with meds   - was to start hydralazine. Causes some SOB, stopped taking and sob  resolved. - we had started aldactone, he stopped on his own after recalling that his GI doctor had advaiced against in the past.      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  Past Medical History:  Diagnosis Date   Aneurysm (HCC)    right femoral pseudoaneurysm, thrombosed (following 03/20/07 cardiac cath)   CAD, multiple vessel    PCI circumflex 2008, diffuse disease of the right and LAD treated medically.,  //   Nuclear 2010 no ischemia   //   nuclear May, 2012 no ischemia   CHF (congestive heart failure) (HCC)    Cirrhosis of liver without mention of alcohol    Contrast media allergy    pt states he is not allergy to contrast   Diabetes mellitus without complication (HCC)    type 2   Diverticulosis    Dysphagia    Ejection fraction    EF 55%, nuclear, May, 2012   Esophageal reflux    IBS (irritable bowel syndrome)    Lower abdominal pain    Mixed dyslipidemia    Morbid obesity (HCC)    Other chronic nonalcoholic liver disease    Postsurgical percutaneous transluminal coronary angioplasty status    Preop cardiovascular exam  Cardiovascular clearance for prostate surgery October, 2013   Prostate cancer [facility] [place]) 03/24/2012   Renal insufficiency    Unspecified diastolic heart failure    Unspecified essential hypertension      Allergies  Allergen Reactions   Codeine Palpitations   Plavix [Clopidogrel Bisulfate] Rash     Current Outpatient Medications  Medication Sig Dispense Refill   amLODipine (NORVASC) 10 MG tablet Take 10 mg by mouth daily.      aspirin EC 81 MG tablet Take 1 tablet (81 mg total) by mouth daily.     furosemide (LASIX) 20 MG tablet Take 1 tablet by mouth once daily 30 tablet 6   hydrALAZINE (APRESOLINE) 25 MG tablet Take 1 tablet (25 mg total) by mouth 2 (two) times daily. 60 tablet 6   insulin glargine (LANTUS) 100 UNIT/ML injection Inject 45-50 Units into the skin See admin instructions. 50 units in the morning, and 45 units at bedtime     lisinopril  (PRINIVIL,ZESTRIL) 40 MG tablet Take 40 mg by mouth every morning.      metoprolol tartrate (LOPRESSOR) 50 MG tablet Take 1 tablet (50 mg total) by mouth 2 (two) times daily.     Multiple Vitamin (MULTI VITAMIN MENS PO) Take 1 tablet by mouth daily.      nitroGLYCERIN (NITROSTAT) 0.4 MG SL tablet Place 1 tablet (0.4 mg total) under the tongue every 5 (five) minutes x 3 doses as needed for chest pain (if no relief after 2nd dose, proceed to ED or call 911). For chest pains 25 tablet 3   pantoprazole (PROTONIX) 40 MG tablet Take 40 mg by mouth daily.     zinc gluconate 50 MG tablet Take 50 mg by mouth daily.     No current facility-administered medications for this visit.     Past Surgical History:  Procedure Laterality Date   BACK SURGERY     took cyst out of back   CARDIAC CATHETERIZATION     CARPAL TUNNEL RELEASE Bilateral    CHOLECYSTECTOMY  06/1998   COLONOSCOPY  03/03/03   COLONOSCOPY  08/17/2011   Procedure: COLONOSCOPY;  Surgeon: Malissa Hippo, MD;  [facility];  Service: Endoscopy;  Laterality: N/A;  1045   COLONOSCOPY N/A 07/05/2017   Procedure: COLONOSCOPY;  Surgeon: Malissa Hippo, MD;  Location: [facility];  Service: Endoscopy;  Laterality: N/A;  2:00   CORONARY IMAGING/OCT N/A 05/03/2022   Procedure: INTRAVASCULAR IMAGING/OCT;  Surgeon: Yvonne Kendall, MD;  Location: [facility];  Service: Cardiovascular;  Laterality: N/A;   CORONARY PRESSURE/FFR WITH 3D MAPPING N/A 05/03/2022   Procedure: Coronary Pressure Wire/FFR w/3D Mapping;  Surgeon: Yvonne Kendall, MD;  Location: [facility];  Service: Cardiovascular;  Laterality: N/A;   CORONARY STENT INTERVENTION N/A 05/03/2022   Procedure: CORONARY STENT INTERVENTION;  Surgeon: Yvonne Kendall, MD;  Location: [facility];  Service: Cardiovascular;  Laterality: N/A;   ESOPHAGOGASTRODUODENOSCOPY (EGD) WITH PROPOFOL N/A 12/20/2017   Procedure: ESOPHAGOGASTRODUODENOSCOPY (EGD) WITH PROPOFOL;   Surgeon: Malissa Hippo, MD;  [facility];  Service: Endoscopy;  Laterality: N/A;  11:10 :   LEFT HEART CATH AND CORONARY ANGIOGRAPHY N/A 05/03/2022   Procedure: LEFT HEART CATH AND CORONARY ANGIOGRAPHY;  Surgeon: Yvonne Kendall, MD;  [facility]: [facility];  Service: Cardiovascular;  Laterality: N/A;   LEFT HEART CATHETERIZATION WITH CORONARY ANGIOGRAM N/A 12/07/2013   Procedure: LEFT HEART CATHETERIZATION WITH CORONARY ANGIOGRAM;  Surgeon: Peter M Swaziland, MD;  Location:  [facility] CATH LAB;  Service: Cardiovascular;  Laterality: N/A;   left rotator     POLYPECTOMY  12/20/2017   Procedure: POLYPECTOMY;  Surgeon: Malissa Hippo, MD;  Location: [facility];  Service: Endoscopy;;  Gastric (HS) x2   RIGHT KNEE SURGERY     ROBOT ASSISTED LAPAROSCOPIC RADICAL PROSTATECTOMY  06/26/2012   Procedure: ROBOTIC ASSISTED LAPAROSCOPIC RADICAL PROSTATECTOMY LEVEL 3;  Surgeon: Crecencio Mc, MD;  Location: [facility];  Service: Urology;  Laterality: N/A;       Two cardiac stents  2009   UPPER GASTROINTESTINAL ENDOSCOPY  02/21/2011   EGD ED   UPPER GASTROINTESTINAL ENDOSCOPY  03/03/03   TCS   VASECTOMY       Allergies  Allergen Reactions   Codeine Palpitations   Plavix [Clopidogrel Bisulfate] Rash      Family History  Problem Relation Age of Onset   Heart disease Mother    Breast cancer Mother    Heart disease Father    Breast cancer Sister    Ovarian cancer Sister    Heart disease Brother    Healthy Daughter    Healthy Daughter    Healthy Daughter    Healthy Daughter    Coronary artery disease Other        family history     Social History Mr. Horbal reports that he quit smoking about 24 years ago. His smoking use included cigarettes. He has a 90.00 pack-year smoking history. He has been exposed to tobacco smoke. He has never used smokeless tobacco. Mr. Wanamaker reports no history of alcohol use.   Review of Systems CONSTITUTIONAL: No weight loss, fever, chills, weakness  or fatigue.  HEENT: Eyes: No visual loss, blurred vision, double vision or yellow sclerae.No hearing loss, sneezing, congestion, runny nose or sore throat.  SKIN: No rash or itching.  CARDIOVASCULAR: per hpi RESPIRATORY: No shortness of breath, cough or sputum.  GASTROINTESTINAL: No anorexia, nausea, vomiting or diarrhea. No abdominal pain or blood.  GENITOURINARY: No burning on urination, no polyuria NEUROLOGICAL: No headache, dizziness, syncope, paralysis, ataxia, numbness or tingling in the extremities. No change in bowel or bladder control.  MUSCULOSKELETAL: No muscle, back pain, joint pain or stiffness.  LYMPHATICS: No enlarged nodes. No history of splenectomy.  PSYCHIATRIC: No history of depression or anxiety.  ENDOCRINOLOGIC: No reports of sweating, cold or heat intolerance. No polyuria or polydipsia.  Marland Kitchen   Physical Examination Today's Vitals   01/30/23 0828  BP: (!) 170/90  Pulse: 61  SpO2: 96%  Weight: (!) 305 lb 12.8 oz (138.7 kg)  Height: 6\' 2"  (1.88 m)   Body mass index is 39.26 kg/m.  Gen: resting comfortably, no acute distress HEENT: no scleral icterus, pupils equal round and reactive, no palptable cervical adenopathy,  CV: RRR, no mrg, no jvd. Trace bilateral edema Resp: Clear to auscultation bilaterally GI: abdomen is soft, non-tender, non-distended, normal bowel sounds, no hepatosplenomegaly MSK: extremities are warm, no edema.  Skin: warm, no rash Neuro:  no focal deficits Psych: appropriate affect   Diagnostic Studies  02/2007 Cath  FINDINGS: Aortic pressure 131/85 with a mean of 106, left ventricular  pressure is 128/16.  The left mainstem has luminal irregularities but no significant  angiographic stenosis.  The left mainstem bifurcates into the LAD and left circumflex. The LAD  is a large-caliber vessel that courses down and reaches the LV apex.  Proximal portion of the LAD in the area of the first septal perforator  has a diffuse 50%  stenosis.  There is a diagonal Antoinette Borgwardt that arises from  this area. The diagonal is widely patent. The remaining portions of  the mid and distal LAD have diffuse nonobstructive plaque without  significant focal stenosis.  The left circumflex is a large-caliber caliber vessel. It gives off a  large first OM Patryk Conant that has an 80% stenosis in its proximal portion.  The AV groove circumflex continues down and provides a small left  posterolateral Sayde Lish. There is no significant angiographic disease in  the AV groove circumflex.  The right coronary artery is diffusely diseased. There are luminal  irregularities in the proximal portion. The midportion in the stented  segment that is ectatic. Distally, there is diffuse disease with 50%  stenosis just before the bifurcation of the PDA and posterior AV  segment. The PDA and posterolateral branches have no significant  angiographic disease  Left ventriculography demonstrates normal LV systolic function with an  LVEF of 60%. There is no mitral regurgitation.  ASSESSMENT:  1. Severe obtuse marginal 1 stenosis.  2. Moderate diffuse disease in the left anterior descending artery and  right coronary artery.  3. Normal left ventricular systolic function.  4. Successful percutaneous coronary intervention of the left  circumflex with a bare-metal stent.  Will continue aspirin indefinitely. The patient should be on  ticlopidine for 30 days with close monitoring of his CBC.    02/2013 Lexiscan MPI  Small partially reversible apical to mid anteroseptal defect. Partially reversible mid anterior defect. Fixed mid to basal inferior wall defect. Normal wall motion, thought to be artifact/noted soft tissue attenuation    11/17/13 clinic EKG  NSR,      11/2013 Cath Procedural Findings:  Hemodynamics:  AO 110/62 mean 79 mm Hg  LV 113/12 mm Hg  Coronary angiography:  Coronary dominance: right  Left mainstem: Normal  Left anterior descending (LAD): 30% disease in the  proximal vessel. The first diagonal is normal.  Left circumflex (LCx): The LCx gives rise to a large OM 1, a small OM2, and terminates in a posterolateral Shishir Krantz. The stent in the OM 1 is widely patent. No other significant disease in the LCx.  Right coronary artery (RCA): Large vessel. There is 30% disease in the proximal vessel, at the crux, and in the distal RCA.  Left ventriculography: Left ventricular systolic function is normal, LVEF is estimated at 55-65%, there is no significant mitral regurgitation  Final Conclusions:  1. Nonobstructive CAD. The prior stent in OM1 is widely patent.  2. Normal LV function.  Recommendations: Continue medical therapy.    03/2019 echo [facility] Summary   1. Echo contrast utilized to enhance endocardial border definition.   2. The left ventricle is normal in size with mildly increased wall thickness.   3. Normal left ventricular systolic function, ejection fraction 60-65%.   4. Normal right ventricular size and systolic function.   5. The aortic valve is trileaflet with mildly thickened leaflets with normal excursion.   6. Aortic root borderline dilated.   7. Ascending aorta borderline dilated.   Assessment and Plan   1. CAD  - recent LAD stent - may be side effects of brillinta. Some resources list peripheral edema as side effects, certaintly can see SOB with brilininta. Had some LE edema at prior visit before brilinta but worst since starting and had increased SOB. Changed to effient due to plavix allergy - can stop effient 05/04/23 - continue other meds   2. Hyperlipidemia  -remains resistant with statin,  GI had been ok on there end regarding statin and his liver history but he remains very reluctant - will refer to lipid clinic to consider [id number]  3. HTN - above goal - reported side effects on hydralazine. Tried aldactone but he was uncomfortable taking, reported in the past his liver doctor had not wanted him on - d/c lopressor, start coreg  12.5mg  bid - could consider changing lasix to chlortahlidone in the near future as well.        Antoine Poche, M.D.

## 2023-01-30 NOTE — Addendum Note (Signed)
Addended by: Lesle Chris on: 01/30/2023 11:23 AM   Modules accepted: Orders

## 2023-01-30 NOTE — Patient Instructions (Addendum)
Medication Instructions:   May stop Effient on 05/04/2023 Hydralazine removed from list & noted as allergy (shortness of breath) Stop Metoprolol tart (Lopressor) Begin Coreg 12.5mg  twice a day   Continue all other medications.     Labwork:  none  Testing/Procedures:  none  Follow-Up:  6 months   Any Other Special Instructions Will Be Listed Below (If Applicable). Call office with update on your BP readings in 2 weeks.   You have been referred to Lipid Clinic   If you need a refill on your cardiac medications before your next appointment, please call your pharmacy.

## 2023-02-05 ENCOUNTER — Telehealth: Payer: Self-pay | Admitting: Cardiology

## 2023-02-05 MED ORDER — CARVEDILOL 12.5 MG PO TABS: 12.5000 mg | ORAL_TABLET | Freq: Two times a day (BID) | ORAL | 3 refills | Status: DC

## 2023-02-05 NOTE — Telephone Encounter (Signed)
*  STAT* If patient is at the pharmacy, call can be transferred to refill team.   1. Which medications need to be refilled? (please list name of each medication and dose if known) carvedilol (COREG) 12.5 MG tablet   2. Which pharmacy/location (including street and city if local pharmacy) is medication to be sent to? [id number] - [place], [place] - [address]   3. Do they need a 30 day or 90 day supply? 90

## 2023-02-05 NOTE — Telephone Encounter (Signed)
Done

## 2023-02-16 ENCOUNTER — Encounter (HOSPITAL_COMMUNITY): Payer: Self-pay | Admitting: Internal Medicine

## 2023-02-16 ENCOUNTER — Emergency Department (HOSPITAL_COMMUNITY): Payer: Medicare Other

## 2023-02-16 ENCOUNTER — Inpatient Hospital Stay (HOSPITAL_COMMUNITY)
Admission: EM | Admit: 2023-02-16 | Discharge: 2023-02-19 | DRG: 321 | Disposition: A | Discharge status: Home / Self Care | Payer: Medicare Other | Attending: Cardiovascular Disease | Admitting: Cardiovascular Disease

## 2023-02-16 ENCOUNTER — Other Ambulatory Visit: Payer: Self-pay

## 2023-02-16 DIAGNOSIS — I25118 Atherosclerotic heart disease of native coronary artery with other forms of angina pectoris: Secondary | ICD-10-CM | POA: Diagnosis present

## 2023-02-16 DIAGNOSIS — R079 Chest pain, unspecified: Secondary | ICD-10-CM | POA: Diagnosis present

## 2023-02-16 DIAGNOSIS — K219 Gastro-esophageal reflux disease without esophagitis: Secondary | ICD-10-CM | POA: Diagnosis present

## 2023-02-16 DIAGNOSIS — Y831 Surgical operation with implant of artificial internal device as the cause of abnormal reaction of the patient, or of later complication, without mention of misadventure at the time of the procedure: Secondary | ICD-10-CM | POA: Diagnosis present

## 2023-02-16 DIAGNOSIS — Z7982 Long term (current) use of aspirin: Secondary | ICD-10-CM | POA: Diagnosis not present

## 2023-02-16 DIAGNOSIS — E782 Mixed hyperlipidemia: Secondary | ICD-10-CM | POA: Diagnosis present

## 2023-02-16 DIAGNOSIS — Z8679 Personal history of other diseases of the circulatory system: Secondary | ICD-10-CM

## 2023-02-16 DIAGNOSIS — I2510 Atherosclerotic heart disease of native coronary artery without angina pectoris: Secondary | ICD-10-CM | POA: Diagnosis not present

## 2023-02-16 DIAGNOSIS — T82855A Stenosis of coronary artery stent, initial encounter: Secondary | ICD-10-CM | POA: Diagnosis present

## 2023-02-16 DIAGNOSIS — I5189 Other ill-defined heart diseases: Secondary | ICD-10-CM

## 2023-02-16 DIAGNOSIS — I509 Heart failure, unspecified: Secondary | ICD-10-CM | POA: Diagnosis not present

## 2023-02-16 DIAGNOSIS — Z794 Long term (current) use of insulin: Secondary | ICD-10-CM | POA: Diagnosis not present

## 2023-02-16 DIAGNOSIS — Z955 Presence of coronary angioplasty implant and graft: Secondary | ICD-10-CM

## 2023-02-16 DIAGNOSIS — Z87891 Personal history of nicotine dependence: Secondary | ICD-10-CM

## 2023-02-16 DIAGNOSIS — Z7902 Long term (current) use of antithrombotics/antiplatelets: Secondary | ICD-10-CM

## 2023-02-16 DIAGNOSIS — Z79899 Other long term (current) drug therapy: Secondary | ICD-10-CM | POA: Diagnosis not present

## 2023-02-16 DIAGNOSIS — Z9079 Acquired absence of other genital organ(s): Secondary | ICD-10-CM | POA: Diagnosis not present

## 2023-02-16 DIAGNOSIS — Z6838 Body mass index (BMI) 38.0-38.9, adult: Secondary | ICD-10-CM | POA: Diagnosis not present

## 2023-02-16 DIAGNOSIS — E119 Type 2 diabetes mellitus without complications: Secondary | ICD-10-CM | POA: Diagnosis present

## 2023-02-16 DIAGNOSIS — I209 Angina pectoris, unspecified: Secondary | ICD-10-CM | POA: Diagnosis not present

## 2023-02-16 DIAGNOSIS — I5033 Acute on chronic diastolic (congestive) heart failure: Secondary | ICD-10-CM | POA: Diagnosis present

## 2023-02-16 DIAGNOSIS — I25110 Atherosclerotic heart disease of native coronary artery with unstable angina pectoris: Secondary | ICD-10-CM | POA: Diagnosis present

## 2023-02-16 DIAGNOSIS — K580 Irritable bowel syndrome with diarrhea: Secondary | ICD-10-CM | POA: Diagnosis present

## 2023-02-16 DIAGNOSIS — K7460 Unspecified cirrhosis of liver: Secondary | ICD-10-CM | POA: Diagnosis present

## 2023-02-16 DIAGNOSIS — E785 Hyperlipidemia, unspecified: Secondary | ICD-10-CM

## 2023-02-16 DIAGNOSIS — Z885 Allergy status to narcotic agent status: Secondary | ICD-10-CM

## 2023-02-16 DIAGNOSIS — Z888 Allergy status to other drugs, medicaments and biological substances status: Secondary | ICD-10-CM

## 2023-02-16 DIAGNOSIS — K7581 Nonalcoholic steatohepatitis (NASH): Secondary | ICD-10-CM | POA: Diagnosis present

## 2023-02-16 DIAGNOSIS — I10 Essential (primary) hypertension: Secondary | ICD-10-CM | POA: Diagnosis present

## 2023-02-16 DIAGNOSIS — I214 Non-ST elevation (NSTEMI) myocardial infarction: Principal | ICD-10-CM | POA: Diagnosis present

## 2023-02-16 DIAGNOSIS — Z8546 Personal history of malignant neoplasm of prostate: Secondary | ICD-10-CM

## 2023-02-16 DIAGNOSIS — I110 Hypertensive heart disease with heart failure: Secondary | ICD-10-CM | POA: Diagnosis present

## 2023-02-16 DIAGNOSIS — Z9049 Acquired absence of other specified parts of digestive tract: Secondary | ICD-10-CM

## 2023-02-16 LAB — BASIC METABOLIC PANEL
Anion gap: 8 (ref 5–15)
BUN: 14 mg/dL (ref 8–23)
CO2: 22 mmol/L (ref 22–32)
Calcium: 8.6 mg/dL — ABNORMAL LOW (ref 8.9–10.3)
Chloride: 105 mmol/L (ref 98–111)
Creatinine, Ser: 0.77 mg/dL (ref 0.61–1.24)
GFR, Estimated: >60 mL/min (ref >60)
Glucose, Bld: 281 mg/dL — ABNORMAL HIGH (ref 70–99)
Potassium: 4 mmol/L (ref 3.5–5.1)
Sodium: 135 mmol/L (ref 135–145)

## 2023-02-16 LAB — CBC
HCT: 39.5 % (ref 39.0–52.0)
Hemoglobin: 13.7 g/dL (ref 13.0–17.0)
MCH: 28 pg (ref 26.0–34.0)
MCHC: 34.7 g/dL (ref 30.0–36.0)
MCV: 80.6 fL (ref 80.0–100.0)
Platelets: 114 10*3/uL — ABNORMAL LOW (ref 150–400)
RBC: 4.9 MIL/uL (ref 4.22–5.81)
RDW: 13.8 % (ref 11.5–15.5)
WBC: 5.4 10*3/uL (ref 4.0–10.5)
nRBC: 0 % (ref 0.0–0.2)

## 2023-02-16 LAB — GLUCOSE, CAPILLARY: Glucose-Capillary: 165 mg/dL — ABNORMAL HIGH (ref 70–99)

## 2023-02-16 LAB — TROPONIN I (HIGH SENSITIVITY)
Troponin I (High Sensitivity): 38 ng/L — ABNORMAL HIGH (ref <18)
Troponin I (High Sensitivity): 49 ng/L — ABNORMAL HIGH (ref <18)
Troponin I (High Sensitivity): 57 ng/L — ABNORMAL HIGH (ref <18)

## 2023-02-16 MED ORDER — ACETAMINOPHEN 325 MG PO TABS: 650.0000 mg | ORAL_TABLET | ORAL | Status: DC | PRN

## 2023-02-16 MED ORDER — FUROSEMIDE 10 MG/ML IJ SOLN
40.0000 mg | Freq: Two times a day (BID) | INTRAMUSCULAR | Status: AC
  Administered 2023-02-17 (×2): 40 mg via INTRAVENOUS
  Filled 2023-02-16 (×2): qty 4

## 2023-02-16 MED ORDER — INSULIN ASPART 100 UNIT/ML IJ SOLN
0.0000 [IU] | Freq: Three times a day (TID) | INTRAMUSCULAR | Status: DC
  Administered 2023-02-17: 3 [IU] via SUBCUTANEOUS
  Administered 2023-02-17: 2 [IU] via SUBCUTANEOUS
  Administered 2023-02-17: 3 [IU] via SUBCUTANEOUS
  Administered 2023-02-18: 5 [IU] via SUBCUTANEOUS
  Administered 2023-02-18 (×2): 2 [IU] via SUBCUTANEOUS
  Administered 2023-02-19: 7 [IU] via SUBCUTANEOUS
  Administered 2023-02-19: 1 [IU] via SUBCUTANEOUS

## 2023-02-16 MED ORDER — ROSUVASTATIN CALCIUM 20 MG PO TABS: 20.0000 mg | ORAL_TABLET | Freq: Every day | ORAL | Status: DC

## 2023-02-16 MED ORDER — ASPIRIN 81 MG PO TBEC
81.0000 mg | DELAYED_RELEASE_TABLET | Freq: Every day | ORAL | Status: DC
  Administered 2023-02-17 – 2023-02-19 (×2): 81 mg via ORAL
  Filled 2023-02-16 (×3): qty 1

## 2023-02-16 MED ORDER — INSULIN GLARGINE-YFGN 100 UNIT/ML ~~LOC~~ SOLN
30.0000 [IU] | Freq: Every day | SUBCUTANEOUS | Status: DC
  Filled 2023-02-16: qty 0.3

## 2023-02-16 MED ORDER — INSULIN GLARGINE-YFGN 100 UNIT/ML ~~LOC~~ SOLN
25.0000 [IU] | Freq: Every day | SUBCUTANEOUS | Status: DC
  Administered 2023-02-17 (×2): 25 [IU] via SUBCUTANEOUS
  Filled 2023-02-16 (×3): qty 0.25

## 2023-02-16 MED ORDER — CARVEDILOL 12.5 MG PO TABS
12.5000 mg | ORAL_TABLET | Freq: Two times a day (BID) | ORAL | Status: DC
  Administered 2023-02-17 – 2023-02-18 (×3): 12.5 mg via ORAL
  Filled 2023-02-16 (×3): qty 1

## 2023-02-16 MED ORDER — INSULIN ASPART 100 UNIT/ML IJ SOLN
0.0000 [IU] | Freq: Every day | INTRAMUSCULAR | Status: DC
  Administered 2023-02-17 – 2023-02-18 (×2): 3 [IU] via SUBCUTANEOUS

## 2023-02-16 MED ORDER — PRASUGREL HCL 10 MG PO TABS: 10.0000 mg | ORAL_TABLET | Freq: Every day | ORAL | Status: DC

## 2023-02-16 MED ORDER — AMLODIPINE BESYLATE 10 MG PO TABS
10.0000 mg | ORAL_TABLET | Freq: Every day | ORAL | Status: DC
  Administered 2023-02-16: 10 mg via ORAL
  Filled 2023-02-16: qty 1

## 2023-02-16 MED ORDER — ROSUVASTATIN CALCIUM 20 MG PO TABS
40.0000 mg | ORAL_TABLET | Freq: Every day | ORAL | Status: DC
  Administered 2023-02-16: 40 mg via ORAL
  Filled 2023-02-16: qty 2

## 2023-02-16 MED ORDER — LISINOPRIL 20 MG PO TABS
40.0000 mg | ORAL_TABLET | Freq: Every day | ORAL | Status: DC
  Administered 2023-02-17 – 2023-02-19 (×3): 40 mg via ORAL
  Filled 2023-02-16 (×3): qty 2

## 2023-02-16 MED ORDER — NITROGLYCERIN 0.4 MG SL SUBL: 0.4000 mg | SUBLINGUAL_TABLET | SUBLINGUAL | Status: DC | PRN

## 2023-02-16 MED ORDER — HEPARIN BOLUS VIA INFUSION
4000.0000 [IU] | Freq: Once | INTRAVENOUS | Status: AC
  Administered 2023-02-16: 4000 [IU] via INTRAVENOUS
  Filled 2023-02-16: qty 4000

## 2023-02-16 MED ORDER — ONDANSETRON HCL 4 MG/2ML IJ SOLN: 4.0000 mg | Freq: Four times a day (QID) | INTRAMUSCULAR | Status: DC | PRN

## 2023-02-16 MED ORDER — HEPARIN (PORCINE) 25000 UT/250ML-% IV SOLN
1650.0000 [IU]/h | INTRAVENOUS | Status: DC
  Administered 2023-02-16: 1350 [IU]/h via INTRAVENOUS
  Administered 2023-02-17: 1550 [IU]/h via INTRAVENOUS
  Administered 2023-02-18: 1650 [IU]/h via INTRAVENOUS
  Filled 2023-02-16 (×3): qty 250

## 2023-02-16 MED ORDER — PANTOPRAZOLE SODIUM 40 MG PO TBEC
40.0000 mg | DELAYED_RELEASE_TABLET | Freq: Every day | ORAL | Status: DC
  Administered 2023-02-17 – 2023-02-19 (×3): 40 mg via ORAL
  Filled 2023-02-16 (×3): qty 1

## 2023-02-16 NOTE — ED Notes (Signed)
Pt has departed with [facility]. Receiving nurse alerted pt has departed.

## 2023-02-16 NOTE — ED Notes (Signed)
Pt updated on eta of transport

## 2023-02-16 NOTE — ED Triage Notes (Signed)
Pt reports diarrhea yesterday. Pt reports feeding his chickens this morning, he came inside for breakfast and got chest pain, sob, and pain in his left arm. Pt reports taking 650 mg of aspirin and nitroglycerin around 0930. Pt reports pain subsides at rest.

## 2023-02-16 NOTE — ED Notes (Signed)
Pt given water per verbal ok from provider.

## 2023-02-16 NOTE — ED Notes (Signed)
Pt return from radiology

## 2023-02-16 NOTE — ED Notes (Signed)
ED TO INPATIENT HANDOFF REPORT  ED Nurse Name and Phone #:  [phone]   S Name/Age/Gender Billy Davis 66 y.o. male Room/Bed: APA02/APA02  Code Status   Code Status: Prior  Home/SNF/Other Home Patient oriented to: self, place, time, and situation Is this baseline? Yes   Triage Complete: Triage complete  Chief Complaint Angina pectoris [facility]) [I20.9]  Triage Note Pt reports diarrhea yesterday. Pt reports feeding his chickens this morning, he came inside for breakfast and got chest pain, sob, and pain in his left arm. Pt reports taking 650 mg of aspirin and nitroglycerin around 0930. Pt reports pain subsides at rest.     Allergies Allergies  Allergen Reactions   Hydralazine Shortness Of Breath   Codeine Palpitations   Plavix [Clopidogrel Bisulfate] Rash    Level of Care/Admitting Diagnosis ED Disposition     ED Disposition  Admit   Condition  --   Comment  Hospital Area: [facility] [[id number]]  Level of Care: Progressive [102]  Admit to Progressive based on following criteria: CARDIOVASCULAR & THORACIC of moderate stability with acute coronary syndrome symptoms/low risk myocardial infarction/hypertensive urgency/arrhythmias/heart failure potentially compromising stability and stable post cardiovascular intervention patients.  May admit patient to [facility] or [facility] if equivalent level of care is available:: No  Interfacility transfer: Yes  Covid Evaluation: Asymptomatic - no recent exposure (last 10 days) testing not required  Diagnosis: Angina pectoris [facility]) [[id number]]  Admitting Physician: Gabriela Eves  Attending Physician: Duke Salvia [phone]  Certification:: I certify this patient will need inpatient services for at least 2 midnights  Estimated Length of Stay: 2          B Medical/Surgery History Past Medical History:  Diagnosis Date   Aneurysm (HCC)    right femoral pseudoaneurysm, thrombosed (following 03/20/07  cardiac cath)   CAD, multiple vessel    PCI circumflex 2008, diffuse disease of the right and LAD treated medically.,  //   Nuclear 2010 no ischemia   //   nuclear May, 2012 no ischemia   CHF (congestive heart failure) (HCC)    Cirrhosis of liver without mention of alcohol    Contrast media allergy    pt states he is not allergy to contrast   Diabetes mellitus without complication (HCC)    type 2   Diverticulosis    Dysphagia    Ejection fraction    EF 55%, nuclear, May, 2012   Esophageal reflux    IBS (irritable bowel syndrome)    Lower abdominal pain    Mixed dyslipidemia    Morbid obesity (HCC)    Other chronic nonalcoholic liver disease    Postsurgical percutaneous transluminal coronary angioplasty status    Preop cardiovascular exam    Cardiovascular clearance for prostate surgery October, 2013   Prostate cancer (HCC) 03/24/2012   Renal insufficiency    Unspecified diastolic heart failure    Unspecified essential hypertension    Past Surgical History:  Procedure Laterality Date   BACK SURGERY     took cyst out of back   CARDIAC CATHETERIZATION     CARPAL TUNNEL RELEASE Bilateral    CHOLECYSTECTOMY  06/1998   COLONOSCOPY  03/03/03   COLONOSCOPY  08/17/2011   Procedure: COLONOSCOPY;  Surgeon: Malissa Hippo, MD;  Location: [facility];  Service: Endoscopy;  Laterality: N/A;  1045   COLONOSCOPY N/A 07/05/2017   Procedure: COLONOSCOPY;  Surgeon: Malissa Hippo, MD;  Location: [facility] ENDO [facility];  Service: Endoscopy;  Laterality: N/A;  2:00   CORONARY IMAGING/OCT N/A 05/03/2022   Procedure: INTRAVASCULAR IMAGING/OCT;  Surgeon: Yvonne Kendall, MD;  Location: [facility] INVASIVE CV LAB;  Service: Cardiovascular;  Laterality: N/A;   CORONARY PRESSURE/FFR WITH 3D MAPPING N/A 05/03/2022   Procedure: Coronary Pressure Wire/FFR w/3D Mapping;  Surgeon: Yvonne Kendall, MD;  Location: [facility] INVASIVE CV LAB;  Service: Cardiovascular;  Laterality: N/A;   CORONARY STENT INTERVENTION N/A 05/03/2022    Procedure: CORONARY STENT INTERVENTION;  Surgeon: Yvonne Kendall, MD;  Location: [facility] INVASIVE CV LAB;  Service: Cardiovascular;  Laterality: N/A;   ESOPHAGOGASTRODUODENOSCOPY (EGD) WITH PROPOFOL N/A 12/20/2017   Procedure: ESOPHAGOGASTRODUODENOSCOPY (EGD) WITH PROPOFOL;  Surgeon: Malissa Hippo, MD;  Location: [facility];  Service: Endoscopy;  Laterality: N/A;  11:10 :   LEFT HEART CATH AND CORONARY ANGIOGRAPHY N/A 05/03/2022   Procedure: LEFT HEART CATH AND CORONARY ANGIOGRAPHY;  Surgeon: Yvonne Kendall, MD;  Location: [facility] INVASIVE CV LAB;  Service: Cardiovascular;  Laterality: N/A;   LEFT HEART CATHETERIZATION WITH CORONARY ANGIOGRAM N/A 12/07/2013   Procedure: LEFT HEART CATHETERIZATION WITH CORONARY ANGIOGRAM;  Surgeon: Peter M Swaziland, MD;  Location: [facility] CATH LAB;  Service: Cardiovascular;  Laterality: N/A;   left rotator     POLYPECTOMY  12/20/2017   Procedure: POLYPECTOMY;  Surgeon: Malissa Hippo, MD;  Location: [facility];  Service: Endoscopy;;  Gastric (HS) x2   RIGHT KNEE SURGERY     ROBOT ASSISTED LAPAROSCOPIC RADICAL PROSTATECTOMY  06/26/2012   Procedure: ROBOTIC ASSISTED LAPAROSCOPIC RADICAL PROSTATECTOMY LEVEL 3;  Surgeon: Crecencio Mc, MD;  Location: [facility] ORS;  Service: Urology;  Laterality: N/A;       Two cardiac stents  2009   UPPER GASTROINTESTINAL ENDOSCOPY  02/21/2011   EGD ED   UPPER GASTROINTESTINAL ENDOSCOPY  03/03/03   TCS   VASECTOMY       A IV Location/Drains/Wounds Patient Lines/Drains/Airways Status     Active Line/Drains/Airways     Name Placement date Placement time Site Days   Peripheral IV 02/16/23 20 G Right Forearm 02/16/23  1030  Forearm  less than 1   Incision - 6 Ports Abdomen 1: Lateral;Lower 2: Left;Lateral;Upper 3: Umbilicus;Superior 4: Right;Lateral;Lower 5: Right;Medial;Upper 6: Right;Lateral;Upper 06/26/12  1122  -- 3887            Intake/Output Last 24 hours No intake or output data in the 24 hours ending 02/16/23  1713  Labs/Imaging Results for orders placed or performed during the hospital encounter of 02/16/23 (from the past 48 hour(s))  Basic metabolic panel     Status: Abnormal   Collection Time: 02/16/23 10:42 AM  Result Value Ref Range   Sodium 135 135 - 145 mmol/L   Potassium 4.0 3.5 - 5.1 mmol/L   Chloride 105 98 - 111 mmol/L   CO2 22 22 - 32 mmol/L   Glucose, Bld 281 (H) 70 - 99 mg/dL    Comment: Glucose reference range applies only to samples taken after fasting for at least 8 hours.   BUN 14 8 - 23 mg/dL   Creatinine, Ser 7.56 0.61 - 1.24 mg/dL   Calcium 8.6 (L) 8.9 - 10.3 mg/dL   GFR, Estimated >43 >32 mL/min    Comment: (NOTE) Calculated using the CKD-EPI Creatinine Equation (2021)    Anion gap 8 5 - 15    Comment: Performed at [facility], [address], [place], [place] [address]  CBC     Status: Abnormal   Collection Time: 02/16/23  10:42 AM  Result Value Ref Range   WBC 5.4 4.0 - 10.5 K/uL   RBC 4.90 4.22 - 5.81 MIL/uL   Hemoglobin 13.7 13.0 - 17.0 g/dL   HCT 32.4 40.1 - 02.7 %   MCV 80.6 80.0 - 100.0 fL   MCH 28.0 26.0 - 34.0 pg   MCHC 34.7 30.0 - 36.0 g/dL   RDW 25.3 66.4 - 40.3 %   Platelets 114 (L) 150 - 400 K/uL   nRBC 0.0 0.0 - 0.2 %    Comment: Performed at [facility], [address] [place], [place] [address]  Troponin I (High Sensitivity)     Status: Abnormal   Collection Time: 02/16/23 10:42 AM  Result Value Ref Range   Troponin I (High Sensitivity) 38 (H) <18 ng/L    Comment: (NOTE) Elevated high sensitivity troponin I (hsTnI) values and significant  changes across serial measurements may suggest ACS but many other  chronic and acute conditions are known to elevate hsTnI results.  Refer to the "Links" section for chest pain algorithms and additional  guidance. Performed at [facility], [address], [place], [place] [address]   Troponin I (High Sensitivity)     Status: Abnormal   Collection Time: 02/16/23 12:25 PM  Result Value Ref Range    Troponin I (High Sensitivity) 49 (H) <18 ng/L    Comment: (NOTE) Elevated high sensitivity troponin I (hsTnI) values and significant  changes across serial measurements may suggest ACS but many other  chronic and acute conditions are known to elevate hsTnI results.  Refer to the "Links" section for chest pain algorithms and additional  guidance. Performed at [facility], [address], [place], [place] [address]   Troponin I (High Sensitivity)     Status: Abnormal   Collection Time: 02/16/23  2:30 PM  Result Value Ref Range   Troponin I (High Sensitivity) 57 (H) <18 ng/L    Comment: (NOTE) Elevated high sensitivity troponin I (hsTnI) values and significant  changes across serial measurements may suggest ACS but many other  chronic and acute conditions are known to elevate hsTnI results.  Refer to the "Links" section for chest pain algorithms and additional  guidance. Performed at [facility], [address] [place], [place] [address]    DG Chest 2 View  Result Date: 02/16/2023 CLINICAL DATA:  Chest pain, shortness of breath EXAM: CHEST - 2 VIEW COMPARISON:  09/17/2021 FINDINGS: Stable cardiomegaly. Mildly prominent interstitial markings bilaterally. No focal airspace consolidation, pleural effusion, or pneumothorax. IMPRESSION: Cardiomegaly with mild interstitial prominence which could reflect mild edema. Electronically Signed   By: Duanne Guess D.O.   On: 02/16/2023 11:04    Pending Labs Unresulted Labs (From admission, onward)    None       Vitals/Pain Today's Vitals   02/16/23 1615 02/16/23 1630 02/16/23 1645 02/16/23 1650  BP: (!) 161/88 (!) 158/93 (!) 151/81   Pulse: 64 61 64   Resp: 17 15 16    Temp:    97.8 F (36.6 C)  TempSrc:    Oral  SpO2: 94% 95% 92%   Weight:      Height:      PainSc:        Isolation Precautions No active isolations  Medications Medications - No data to display  Mobility walks     Focused  Assessments    R Recommendations: See Admitting Provider Note  Report given to:   Additional Notes:

## 2023-02-16 NOTE — ED Notes (Signed)
SBAR and secure chat report given to and accepted by receiving RN at [facility].

## 2023-02-16 NOTE — ED Provider Notes (Signed)
[place] EMERGENCY DEPARTMENT AT [facility] Provider Note   CSN: [id number] Arrival date & time: 02/16/23  1006     History  Chief Complaint  Patient presents with   Chest Pain   Shortness of Breath   Diarrhea    Billy Davis is a 67 y.o. male with history of CAD (multivessel, cath 2023), HLD, HTN, liver cirrhosis, reflux, IBS, T2DM, prostate cancer who presents to the ER complaining of chest pain, SOB, diarrhea. States starting having some diarrhea yesterday, only one episode, has resolved now. This morning while feeding his chickens, came back inside and started having chest pain, SOB, and pain in his left arm, mostly in his left axilla. Took 650 mg aspirin and nitroglycerin around 0930. Pain improves with rest. Not having pain at this current moment laying down in exam bed. Has had ongoing cough x 1 month, no worsening recently, no fevers/chills.    Chest Pain Associated symptoms: shortness of breath   Shortness of Breath Associated symptoms: chest pain   Diarrhea      Home Medications Prior to Admission medications   Medication Sig Start Date End Date Taking? Authorizing Provider  amLODipine (NORVASC) 10 MG tablet Take 10 mg by mouth daily.  04/01/16   [provider]  aspirin EC 81 MG tablet Take 1 tablet (81 mg total) by mouth daily. 12/27/17   Rehman, Joline Maxcy, MD  carvedilol (COREG) 12.5 MG tablet Take 1 tablet (12.5 mg total) by mouth 2 (two) times daily. 02/05/23 05/06/23  Antoine Poche, MD  furosemide (LASIX) 20 MG tablet Take 1 tablet by mouth once daily 07/09/22   Antoine Poche, MD  insulin glargine (LANTUS) 100 UNIT/ML injection Inject 45-50 Units into the skin See admin instructions. 50 units in the morning, and 45 units at bedtime    [provider]  lisinopril (PRINIVIL,ZESTRIL) 40 MG tablet Take 40 mg by mouth every morning.  10/16/10   [provider]  Multiple Vitamin (MULTI VITAMIN MENS PO) Take 1 tablet by mouth  daily.     [provider]  nitroGLYCERIN (NITROSTAT) 0.4 MG SL tablet Place 1 tablet (0.4 mg total) under the tongue every 5 (five) minutes x 3 doses as needed for chest pain (if no relief after 2nd dose, proceed to ED or call 911). For chest pains 05/18/22   Antoine Poche, MD  pantoprazole (PROTONIX) 40 MG tablet Take 40 mg by mouth daily.    [provider]  prasugrel (EFFIENT) 10 MG TABS tablet Take 1 tablet (10 mg total) by mouth daily. 01/30/23 05/04/23  Antoine Poche, MD  zinc gluconate 50 MG tablet Take 50 mg by mouth daily.    [provider]      Allergies    Hydralazine, Codeine, and Plavix [clopidogrel bisulfate]    Review of Systems   Review of Systems  Respiratory:  Positive for shortness of breath.   Cardiovascular:  Positive for chest pain.  Gastrointestinal:  Positive for diarrhea.  All other systems reviewed and are negative.   Physical Exam Updated Vital Signs BP (!) 152/86   Pulse 62   Temp 98.3 F (36.8 C) (Oral)   Resp 16   Ht 6\' 2"  (1.88 m)   Wt 134.7 kg   SpO2 95%   BMI 38.13 kg/m  Physical Exam Vitals and nursing note reviewed.  Constitutional:      Appearance: Normal appearance.  HENT:     Head: Normocephalic and atraumatic.  Eyes:     Conjunctiva/sclera: Conjunctivae normal.  Cardiovascular:     Rate and Rhythm: Normal rate and regular rhythm.  Pulmonary:     Effort: Pulmonary effort is normal. No respiratory distress.     Breath sounds: Normal breath sounds.  Abdominal:     General: There is no distension.     Palpations: Abdomen is soft.     Tenderness: There is no abdominal tenderness.  Musculoskeletal:     Right lower leg: No edema.     Left lower leg: No edema.  Skin:    General: Skin is warm and dry.  Neurological:     General: No focal deficit present.     Mental Status: He is alert.     ED Results / Procedures / Treatments   Labs (all labs ordered are listed, but only abnormal results are  displayed) Labs Reviewed  BASIC METABOLIC PANEL - Abnormal; Notable for the following components:      Result Value   Glucose, Bld 281 (*)    Calcium 8.6 (*)    All other components within normal limits  CBC - Abnormal; Notable for the following components:   Platelets 114 (*)    All other components within normal limits  TROPONIN I (HIGH SENSITIVITY) - Abnormal; Notable for the following components:   Troponin I (High Sensitivity) 38 (*)    All other components within normal limits  TROPONIN I (HIGH SENSITIVITY) - Abnormal; Notable for the following components:   Troponin I (High Sensitivity) 49 (*)    All other components within normal limits  TROPONIN I (HIGH SENSITIVITY) - Abnormal; Notable for the following components:   Troponin I (High Sensitivity) 57 (*)    All other components within normal limits    EKG EKG Interpretation  Date/Time:  Saturday February 16 2023 10:14:13 EDT Ventricular Rate:  68 PR Interval:  162 QRS Duration: 104 QT Interval:  409 QTC Calculation: 435 R Axis:   54 Text Interpretation: Sinus rhythm Confirmed by Gerhard Munch [id number]) on 02/16/2023 10:24:46 AM  Radiology DG Chest 2 View  Result Date: 02/16/2023 CLINICAL DATA:  Chest pain, shortness of breath EXAM: CHEST - 2 VIEW COMPARISON:  09/17/2021 FINDINGS: Stable cardiomegaly. Mildly prominent interstitial markings bilaterally. No focal airspace consolidation, pleural effusion, or pneumothorax. IMPRESSION: Cardiomegaly with mild interstitial prominence which could reflect mild edema. Electronically Signed   By: Duanne Guess D.O.   On: 02/16/2023 11:04    Procedures Procedures    Medications Ordered in ED Medications - No data to display  ED Course/ Medical Decision Making/ A&P Clinical Course as of 02/16/23 1554  Sat Feb 16, 2023  1128 Consult placed to cardiology [LR]  1246 Case discussed with cardiologist Dr Graciela Husbands who recommended obtaining delta troponin and reassess [LR]  1535  Patient's pain has returned [LR]  1545 Case discussed again with Dr Graciela Husbands of cardiology, recommended admission to [facility] for ACS workup [LR]    Clinical Course User Index [LR] Jaymen Fetch, Lora Paula, PA-C                             Medical Decision Making Amount and/or Complexity of Data Reviewed Labs: ordered. Radiology: ordered.   This patient is a 67 y.o. male  who presents to the ED for concern of chest pain and SOB.   Differential diagnoses prior to evaluation: The emergent differential diagnosis includes, but is not limited to,  ACS, pericarditis,  myocarditis, aortic dissection, PE, pneumothorax, esophageal spasm or rupture, chronic angina, pneumonia, bronchitis, GERD, reflux/PUD, biliary disease, pancreatitis, costochondritis, anxiety. This is not an exhaustive differential.   Past Medical History / Co-morbidities / Social History: CAD (multivessel, cath 2023), HLD, HTN, liver cirrhosis, reflux, IBS, T2DM, prostate cancer  Additional history: Chart reviewed. Pertinent results include: Normal echo as of August 2023. Left heart cath with multivessel CAD as of Sept 2023 s/p PCI.   Physical Exam: Physical exam performed. The pertinent findings include: Hypertensive, but otherwise normal vital signs. No acute distress. Heart RRR, lung sounds clear, No peripheral edema.   Lab Tests/Imaging studies: I personally interpreted labs/imaging and the pertinent results include:  CBC normal. BMP with elevated glucose, otherwise normal. Initial troponin 38, 49, 57. I agree with the radiologist interpretation.  Cardiac monitoring: EKG obtained and interpreted by myself and attending physician which shows: NSR  Consultations obtained: I consulted with cardiologist Dr Graciela Husbands who ultimately recommended admission to cardiology service at [facility] for chest pain evaluation and ACS rule out   Disposition: After consideration of the diagnostic results and the patients response to treatment, I  feel that patient is requiring admission to cardiology service for ACS workup in the setting of concerning prior catheterization with multivessel disease, concerning story of sudden onset exertional CP this AM, and uptrending troponins.  I discussed this case with my attending physician Dr. Jeraldine Loots who cosigned this note including patient's presenting symptoms, physical exam, and planned diagnostics and interventions. Attending physician stated agreement with plan or made changes to plan which were implemented.   Final Clinical Impression(s) / ED Diagnoses Final diagnoses:  Angina pectoris (HCC)    Rx / DC Orders ED Discharge Orders     None      Portions of this report may have been transcribed using voice recognition software. Every effort was made to ensure accuracy; however, inadvertent computerized transcription errors may be present.    Jeanella Flattery 02/16/23 1554    Gerhard Munch, MD 02/17/23 1622

## 2023-02-16 NOTE — Progress Notes (Signed)
ANTICOAGULATION CONSULT NOTE - Initial Consult  Pharmacy Consult for heparin Indication: chest pain/ACS  Allergies  Allergen Reactions   Hydralazine Shortness Of Breath   Codeine Palpitations   Plavix [Clopidogrel Bisulfate] Rash    Patient Measurements: Height: 6\' 2"  (188 cm) Weight: 134.7 kg (297 lb) IBW/kg (Calculated) : 82.2 Heparin Dosing Weight: 112.3 kg  Vital Signs: Temp: 97.7 F (36.5 C) (06/22 1830) Temp Source: Oral (06/22 1830) BP: 134/77 (06/22 1830) Pulse Rate: 67 (06/22 1830)  Labs: Recent Labs    02/16/23 1042 02/16/23 1225 02/16/23 1430  HGB 13.7  --   --   HCT 39.5  --   --   PLT 114*  --   --   CREATININE 0.77  --   --   TROPONINIHS 38* 49* 57*    Estimated Creatinine Clearance: 132.6 mL/min (by C-G formula based on SCr of 0.77 mg/dL).   Medical History: Past Medical History:  Diagnosis Date   Aneurysm (HCC)    right femoral pseudoaneurysm, thrombosed (following 03/20/07 cardiac cath)   CAD, multiple vessel    PCI circumflex 2008, diffuse disease of the right and LAD treated medically.,  //   Nuclear 2010 no ischemia   //   nuclear May, 2012 no ischemia   CHF (congestive heart failure) (HCC)    Cirrhosis of liver without mention of alcohol    Contrast media allergy    pt states he is not allergy to contrast   Diabetes mellitus without complication (HCC)    type 2   Diverticulosis    Dysphagia    Ejection fraction    EF 55%, nuclear, May, 2012   Esophageal reflux    IBS (irritable bowel syndrome)    Lower abdominal pain    Mixed dyslipidemia    Morbid obesity (HCC)    Other chronic nonalcoholic liver disease    Postsurgical percutaneous transluminal coronary angioplasty status    Preop cardiovascular exam    Cardiovascular clearance for prostate surgery October, 2013   Prostate cancer (HCC) 03/24/2012   Renal insufficiency    Unspecified diastolic heart failure    Unspecified essential hypertension     Medications:  Scheduled:    amLODipine  10 mg Oral Daily   [START ON 02/17/2023] aspirin EC  81 mg Oral Daily   [START ON 02/17/2023] carvedilol  12.5 mg Oral BID WC   [START ON 02/17/2023] lisinopril  40 mg Oral Daily   [START ON 02/17/2023] pantoprazole  40 mg Oral Daily   [START ON 02/17/2023] prasugrel  10 mg Oral Daily   rosuvastatin  40 mg Oral Daily    Assessment: 67 yo male presented to [facility] ED with chest pain, ShOB, diarrhea. Prev history of CAD w/ BMS to LCX in 2008 and DES to LAD in 2023 currently on Prasugrel. No anticoagulation noted PTA and has not received any this admit thus far. Pharmacy consulted to dose IV heparin for ACS.   Troponins 38 >> 49 >> 57. Baseline hgb 13, plts 114.  Goal of Therapy:  Heparin level 0.3-0.7 units/ml Monitor platelets by anticoagulation protocol: Yes   Plan:  Heparin 4000 units IV x1 bolus Start heparin drip at 1350 units/hr Heparin level in 6 hours Daily heparin level and CBC F/u cardiology recs  Rexford Maus, PharmD, BCPS 02/16/2023 9:49 PM

## 2023-02-16 NOTE — H&P (Signed)
Cardiology Admission History and Physical   Patient ID: Billy Davis MRN: [id number]; DOB: 1955/09/05   Admission date: 02/16/2023  PCP:  Kirstie Peri, MD    [facility] Providers Cardiologist:  Dina Rich, MD        Chief Complaint:  chest pain  Patient Profile:   Billy Davis is a 67 y.o. male with PMHx of CAD s/p PCI in 2023, HTN, HLD, liver cirrhosis, type 2 DM who is being seen 02/16/2023 for the evaluation of chest pain.  History of Present Illness:   Billy Davis was in his usual state of health up until a few days ago when he started feeling worsening shortness of breath and LE edema. He describes class III symptoms. Yesterday he was working on his yard when suddenly felt a left sided chest pain radiating to the arm of severe intensity which improved after sublingual NTG. The total duration was approx 1 hr. Because of this reason he decided to go to the ER. He had 2 additional episodes while in the emergency department.  Hypertensive on arrival with BP of 167/91 mmHg, HR 70, RR 17. Labs relevant for elevated/trending troponin. ECG with no significant ST T changes. CXR with mild pulmonary congestion  Past Medical History:  Diagnosis Date   Aneurysm (HCC)    right femoral pseudoaneurysm, thrombosed (following 03/20/07 cardiac cath)   CAD, multiple vessel    PCI circumflex 2008, diffuse disease of the right and LAD treated medically.,  //   Nuclear 2010 no ischemia   //   nuclear May, 2012 no ischemia   CHF (congestive heart failure) (HCC)    Cirrhosis of liver without mention of alcohol    Contrast media allergy    pt states he is not allergy to contrast   Diabetes mellitus without complication (HCC)    type 2   Diverticulosis    Dysphagia    Ejection fraction    EF 55%, nuclear, May, 2012   Esophageal reflux    IBS (irritable bowel syndrome)    Lower abdominal pain    Mixed dyslipidemia    Morbid obesity (HCC)    Other chronic nonalcoholic  liver disease    Postsurgical percutaneous transluminal coronary angioplasty status    Preop cardiovascular exam    Cardiovascular clearance for prostate surgery October, 2013   Prostate cancer (HCC) 03/24/2012   Renal insufficiency    Unspecified diastolic heart failure    Unspecified essential hypertension     Past Surgical History:  Procedure Laterality Date   BACK SURGERY     took cyst out of back   CARDIAC CATHETERIZATION     CARPAL TUNNEL RELEASE Bilateral    CHOLECYSTECTOMY  06/1998   COLONOSCOPY  03/03/03   COLONOSCOPY  08/17/2011   Procedure: COLONOSCOPY;  Surgeon: Malissa Hippo, MD;  Location: [facility];  Service: Endoscopy;  Laterality: N/A;  1045   COLONOSCOPY N/A 07/05/2017   Procedure: COLONOSCOPY;  Surgeon: Malissa Hippo, MD;  Location: [facility];  Service: Endoscopy;  Laterality: N/A;  2:00   CORONARY IMAGING/OCT N/A 05/03/2022   Procedure: INTRAVASCULAR IMAGING/OCT;  Surgeon: Yvonne Kendall, MD;  Location: [facility];  Service: Cardiovascular;  Laterality: N/A;   CORONARY PRESSURE/FFR WITH 3D MAPPING N/A 05/03/2022   Procedure: Coronary Pressure Wire/FFR w/3D Mapping;  Surgeon: Yvonne Kendall, MD;  Location: [facility];  Service: Cardiovascular;  Laterality: N/A;   CORONARY STENT INTERVENTION N/A 05/03/2022  Procedure: CORONARY STENT INTERVENTION;  Surgeon: Yvonne Kendall, MD;  [facility]: [facility];  Service: Cardiovascular;  Laterality: N/A;   ESOPHAGOGASTRODUODENOSCOPY (EGD) WITH PROPOFOL N/A 12/20/2017   Procedure: ESOPHAGOGASTRODUODENOSCOPY (EGD) WITH PROPOFOL;  Surgeon: Malissa Hippo, MD;  [facility]: [facility];  Service: Endoscopy;  Laterality: N/A;  11:10 :   LEFT HEART CATH AND CORONARY ANGIOGRAPHY N/A 05/03/2022   Procedure: LEFT HEART CATH AND CORONARY ANGIOGRAPHY;  Surgeon: Yvonne Kendall, MD;  [facility];  Service: Cardiovascular;  Laterality: N/A;   LEFT HEART CATHETERIZATION WITH CORONARY  ANGIOGRAM N/A 12/07/2013   Procedure: LEFT HEART CATHETERIZATION WITH CORONARY ANGIOGRAM;  Surgeon: Peter M Swaziland, MD;  Location: [facility] CATH LAB;  Service: Cardiovascular;  Laterality: N/A;   left rotator     POLYPECTOMY  12/20/2017   Procedure: POLYPECTOMY;  Surgeon: Malissa Hippo, MD;  Location: [facility];  Service: Endoscopy;;  Gastric (HS) x2   RIGHT KNEE SURGERY     ROBOT ASSISTED LAPAROSCOPIC RADICAL PROSTATECTOMY  06/26/2012   Procedure: ROBOTIC ASSISTED LAPAROSCOPIC RADICAL PROSTATECTOMY LEVEL 3;  Surgeon: Crecencio Mc, MD;  Location: [facility];  Service: Urology;  Laterality: N/A;       Two cardiac stents  2009   UPPER GASTROINTESTINAL ENDOSCOPY  02/21/2011   EGD ED   UPPER GASTROINTESTINAL ENDOSCOPY  03/03/03   TCS   VASECTOMY       Medications Prior to Admission: Prior to Admission medications   Medication Sig Start Date End Date Taking? Authorizing Provider  amLODipine (NORVASC) 10 MG tablet Take 10 mg by mouth daily.  04/01/16   [provider]  aspirin EC 81 MG tablet Take 1 tablet (81 mg total) by mouth daily. 12/27/17   Rehman, Joline Maxcy, MD  carvedilol (COREG) 12.5 MG tablet Take 1 tablet (12.5 mg total) by mouth 2 (two) times daily. 02/05/23 05/06/23  Antoine Poche, MD  furosemide (LASIX) 20 MG tablet Take 1 tablet by mouth once daily 07/09/22   Antoine Poche, MD  insulin glargine (LANTUS) 100 UNIT/ML injection Inject 45-50 Units into the skin See admin instructions. 50 units in the morning, and 45 units at bedtime    [provider]  lisinopril (PRINIVIL,ZESTRIL) 40 MG tablet Take 40 mg by mouth every morning.  10/16/10   [provider]  Multiple Vitamin (MULTI VITAMIN MENS PO) Take 1 tablet by mouth daily.     [provider]  nitroGLYCERIN (NITROSTAT) 0.4 MG SL tablet Place 1 tablet (0.4 mg total) under the tongue every 5 (five) minutes x 3 doses as needed for chest pain (if no relief after 2nd dose, proceed to ED or call 911). For  chest pains 05/18/22   Antoine Poche, MD  pantoprazole (PROTONIX) 40 MG tablet Take 40 mg by mouth daily.    [provider]  prasugrel (EFFIENT) 10 MG TABS tablet Take 1 tablet (10 mg total) by mouth daily. 01/30/23 05/04/23  Antoine Poche, MD  zinc gluconate 50 MG tablet Take 50 mg by mouth daily.    [provider]     Allergies:    Allergies  Allergen Reactions   Hydralazine Shortness Of Breath   Codeine Palpitations   Plavix [Clopidogrel Bisulfate] Rash    Social History:   Social History   Socioeconomic History   Marital status: Married    Spouse name: MARTHA   Number of children: Not on file   Years of education: Not on file  Highest education level: Not on file  Occupational History   Occupation: [employer]  Tobacco Use   Smoking status: Former    Packs/day: 3.00    Years: 30.00    Additional pack years: 0.00    Total pack years: 90.00    Types: Cigarettes    Quit date: 08/27/1998    Years since quitting: 24.4    Passive exposure: Past   Smokeless tobacco: Never   Tobacco comments:    Pt quit smoking 10 yrs ago, smoked for about 30 yrs.  Vaping Use   Vaping Use: Never used  Substance and Sexual Activity   Alcohol use: No    Alcohol/week: 0.0 standard drinks of alcohol   Drug use: No   Sexual activity: Not on file  Other Topics Concern   Not on file  Social History Narrative   Lives in [place], [place]   Works at [employer]   Social Determinants of Corporate investment banker Strain: Not on [employer] Insecurity: Not on file  Transportation Needs: Not on file  Physical Activity: Not on file  Stress: Not on file  Social Connections: Not on file  Intimate Partner Violence: Not on file    Family History:   The patient's family history includes Breast cancer in his mother and sister; Coronary artery disease in an other family member; Healthy in his daughter, daughter, daughter, and daughter; Heart disease in his brother,  father, and mother; Ovarian cancer in his sister.    ROS:  Please see the history of present illness.  All other ROS reviewed and negative.     Physical Exam/Data:   Vitals:   02/16/23 1745 02/16/23 1800 02/16/23 1815 02/16/23 1830  BP: (!) 161/84 (!) 147/72 (!) 152/78 134/77  Pulse: 71 70 66 67  Resp: (!) 21   11  Temp:    97.7 F (36.5 C)  TempSrc:    Oral  SpO2: 93% 94% 94% 93%  Weight:      Height:       No intake or output data in the 24 hours ending 02/16/23 2232    02/16/2023   10:24 AM 02/16/2023   10:16 AM 01/30/2023    8:28 AM  Last 3 Weights  Weight (lbs) 297 lb 305 lb 305 lb 12.8 oz  Weight (kg) 134.718 kg 138.347 kg 138.71 kg     Body mass index is 38.13 kg/m.  General:  Well nourished, well developed, in no acute distress HEENT: normal Neck: elevated Vascular: No carotid bruits; Distal pulses 2+ bilaterally   Cardiac:  normal S1, S2; RRR; no murmur Lungs:  clear to auscultation bilaterally, no wheezing, rhonchi or rales  Abd: soft, nontender, no hepatomegaly  Ext: 2+ edema Musculoskeletal:  No deformities, BUE and BLE strength normal and equal Skin: warm and dry  Neuro:  CNs 2-12 intact, no focal abnormalities noted Psych:  Normal affect    EKG:  The ECG that was done was personally reviewed and demonstrates NSR  Relevant CV Studies:  Laboratory Data:  High Sensitivity Troponin:   Recent Labs  Lab 02/16/23 1042 02/16/23 1225 02/16/23 1430  TROPONINIHS 38* 49* 57*      Chemistry Recent Labs  Lab 02/16/23 1042  NA 135  K 4.0  CL 105  CO2 22  GLUCOSE 281*  BUN 14  CREATININE 0.77  CALCIUM 8.6*  GFRNONAA >60  ANIONGAP 8    No results for input(s): "PROT", "ALBUMIN", "AST", "ALT", "ALKPHOS", "BILITOT" in  the last 168 hours. Lipids No results for input(s): "CHOL", "TRIG", "HDL", "LABVLDL", "LDLCALC", "CHOLHDL" in the last 168 hours. Hematology Recent Labs  Lab 02/16/23 1042  WBC 5.4  RBC 4.90  HGB 13.7  HCT 39.5  MCV 80.6  MCH  28.0  MCHC 34.7  RDW 13.8  PLT 114*   Thyroid No results for input(s): "TSH", "FREET4" in the last 168 hours. BNPNo results for input(s): "BNP", "PROBNP" in the last 168 hours.  DDimer No results for input(s): "DDIMER" in the last 168 hours.   Radiology/Studies:  DG Chest 2 View  Result Date: 02/16/2023 CLINICAL DATA:  Chest pain, shortness of breath EXAM: CHEST - 2 VIEW COMPARISON:  09/17/2021 FINDINGS: Stable cardiomegaly. Mildly prominent interstitial markings bilaterally. No focal airspace consolidation, pleural effusion, or pneumothorax. IMPRESSION: Cardiomegaly with mild interstitial prominence which could reflect mild edema. Electronically Signed   By: Duanne Guess D.O.   On: 02/16/2023 11:04     Assessment and Plan:   67 y/o with CAD s/p PCI in 2023, HTN, HLD, liver cirrhosis, type 2 DM admitted with NSTEMI.  # NSTEMI # Multivessel CAD s/p LAD PCI in Sep 2023 # Acute on chronic heart failure with preserved ejecting fraction - Typical symptoms, similar to prior anginal pain - Hemodynamically stable, chest pain free - Labs: Cr 0.7, trop 38 -> 49 -> 57 - ECG: NSR, no ischemic ST T changes - CXR: CM with bilateral interstitial opacities. - Last echocardiogram 03/2022: EF 55-60%, normal RV size and function, sclerotic aortic valve with no stenosis. No other valve pathology - Last ischemic eval: Cath 05/03/22, multivessel CAD. RFR-OCR guided PCI to ostial LAD with a 3x38 DES (postdil to 3.6) - TIMI score: 5; GRACE 93 - Home meds: already on ASA. Unclear if he is taking effient or not. Per outpatient cardiology note, he had possible side effects from brilinta and was switched to effient. When asked he said "I ran out of it and I was told I did not need it anymore". No statin. On lisinopril 40, Coreg 12.5 BID, amlodipine 10 and furosemide 20 mg daily. PLAN: - Continue heparin drip and ASA. Had possible side effects to brilinta and is uncertain if he is taking effient or not. -  Telemetry monitoring - BNP - Lipid panel, HbA1c, Lp(a) - LHC/PCI likely on Monday. Will keep NPO after midnight just in case he develops another episode of CP. - Echocardiogram - Declined statin  # Uncontrolled HTN - Resume home antihypertensive agents  # HLD - He has been reluctant to take statins in the past. Discussion has been held with GI due to liver hx but they are ok with it  - Initially ordered crestor but patient declined.  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 Will cut down glargine dose by half to start  # Prostate cancer   Risk Assessment/Risk Scores:    TIMI Risk Score for Unstable Angina or Non-ST Elevation MI:   The patient's TIMI risk score is 5 which indicates a 26% risk of all cause mortality, new or recurrent myocardial infarction or need for urgent revascularization in the next 14 days.       Severity of Illness: The appropriate  patient status for this patient is INPATIENT. Inpatient status is judged to be reasonable and necessary in order to provide the required intensity of service to ensure the patient's safety. The patient's presenting symptoms, physical exam findings, and initial radiographic and laboratory data in the context of their chronic comorbidities is felt to place them at high risk for further clinical deterioration. Furthermore, it is not anticipated that the patient will be medically stable for discharge from the hospital within 2 midnights of admission.   * I certify that at the point of admission it is my clinical judgment that the patient will require inpatient hospital care spanning beyond 2 midnights from the point of admission due to high intensity of service, high risk for further  deterioration and high frequency of surveillance required.*   For questions or updates, please contact [facility] Please consult [email or web address] for contact info under     Signed, Regan Rakers, MD  02/16/2023 10:32 PM

## 2023-02-16 NOTE — ED Notes (Signed)
Pt spouse came out of room in a loud tone stating that if someone does not get in there and do something now they are leaving. RN went to spouse right away to assess problem.  Upon entering room, pt had removed himself off of the monitor, and fully dressed himself. Pt stated, "We're going to [facility]. I might die on the way, but I'm going anyway." RN reminded pt and spouse that the ED team was collaborating with the cardiologist at [facility] and a plan was being made. Pt stated, "Look at me, I can barely stand." RN advised the patient, if he is weak and struggling to stand, he should sit back down. Rn educated pt that with rising troponin levels, he is encouraged to remain on the heart monitor so he can be monitored by the staff. Provider came in at that time and reiterated Rn teaching. Provider advised, he is going to be admitted on a cardiac floor at [facility] and transportation was being worked out.  Pt and spouse calmed dow. Pt was requested to undress back to the hospital gown. Pt complied. Pt was placed back on the monitor. Pt complained that the "BP doesn't even work." Rn assessed and discovered he had indeed missed 1 BP reading. RN educated that equipment does fail at times, but we have a plethora of vital readings and he is being well monitored. Pt is back in bed, free from pain and resting. He has been fully updated.

## 2023-02-16 NOTE — ED Notes (Signed)
[facility] has been called for transport 

## 2023-02-16 NOTE — ED Notes (Signed)
Report give to and accepted by [facility]. ETA 15 minutes.

## 2023-02-16 NOTE — ED Notes (Signed)
Pt updated on elevated troponin level.

## 2023-02-16 NOTE — ED Notes (Signed)
Pt ate 100% of tray. Fresh water given afterwards

## 2023-02-16 NOTE — ED Notes (Signed)
Pt given warm blankets.

## 2023-02-17 ENCOUNTER — Inpatient Hospital Stay (HOSPITAL_COMMUNITY): Payer: Medicare Other

## 2023-02-17 DIAGNOSIS — I214 Non-ST elevation (NSTEMI) myocardial infarction: Secondary | ICD-10-CM | POA: Diagnosis not present

## 2023-02-17 DIAGNOSIS — I209 Angina pectoris, unspecified: Secondary | ICD-10-CM | POA: Diagnosis not present

## 2023-02-17 LAB — COMPREHENSIVE METABOLIC PANEL
ALT: 35 U/L (ref 0–44)
AST: 29 U/L (ref 15–41)
Albumin: 3.7 g/dL (ref 3.5–5.0)
Alkaline Phosphatase: 67 U/L (ref 38–126)
Anion gap: 16 — ABNORMAL HIGH (ref 5–15)
BUN: 11 mg/dL (ref 8–23)
CO2: 23 mmol/L (ref 22–32)
Calcium: 8.9 mg/dL (ref 8.9–10.3)
Chloride: 101 mmol/L (ref 98–111)
Creatinine, Ser: 0.72 mg/dL (ref 0.61–1.24)
GFR, Estimated: >60 mL/min (ref >60)
Glucose, Bld: 184 mg/dL — ABNORMAL HIGH (ref 70–99)
Potassium: 3.5 mmol/L (ref 3.5–5.1)
Sodium: 140 mmol/L (ref 135–145)
Total Bilirubin: 1.2 mg/dL (ref 0.3–1.2)
Total Protein: 6.6 g/dL (ref 6.5–8.1)

## 2023-02-17 LAB — ECHOCARDIOGRAM COMPLETE
AR max vel: 2.73 cm2
AV Area VTI: 3.45 cm2
AV Area mean vel: 3.01 cm2
AV Mean grad: 3 mmHg
AV Peak grad: 5.2 mmHg
Ao pk vel: 1.14 m/s
Area-P 1/2: 2.76 cm2
Height: 74 in
S' Lateral: 4.3 cm
Weight: 4752 oz

## 2023-02-17 LAB — T4, FREE: Free T4: 1.04 ng/dL (ref 0.61–1.12)

## 2023-02-17 LAB — CBC
HCT: 42.7 % (ref 39.0–52.0)
Hemoglobin: 14.5 g/dL (ref 13.0–17.0)
MCH: 27.1 pg (ref 26.0–34.0)
MCHC: 34 g/dL (ref 30.0–36.0)
MCV: 79.7 fL — ABNORMAL LOW (ref 80.0–100.0)
Platelets: 120 10*3/uL — ABNORMAL LOW (ref 150–400)
RBC: 5.36 MIL/uL (ref 4.22–5.81)
RDW: 13.7 % (ref 11.5–15.5)
WBC: 5.7 10*3/uL (ref 4.0–10.5)
nRBC: 0 % (ref 0.0–0.2)

## 2023-02-17 LAB — GLUCOSE, CAPILLARY
Glucose-Capillary: 168 mg/dL — ABNORMAL HIGH (ref 70–99)
Glucose-Capillary: 220 mg/dL — ABNORMAL HIGH (ref 70–99)
Glucose-Capillary: 222 mg/dL — ABNORMAL HIGH (ref 70–99)
Glucose-Capillary: 251 mg/dL — ABNORMAL HIGH (ref 70–99)

## 2023-02-17 LAB — HEMOGLOBIN A1C
Hgb A1c MFr Bld: 9.4 % — ABNORMAL HIGH (ref 4.8–5.6)
Mean Plasma Glucose: 223.08 mg/dL

## 2023-02-17 LAB — LIPID PANEL
Cholesterol: 236 mg/dL — ABNORMAL HIGH (ref 0–200)
HDL: 54 mg/dL (ref >40)
LDL Cholesterol: 158 mg/dL — ABNORMAL HIGH (ref 0–99)
Total CHOL/HDL Ratio: 4.4 RATIO
Triglycerides: 118 mg/dL (ref <150)
VLDL: 24 mg/dL (ref 0–40)

## 2023-02-17 LAB — HEPARIN LEVEL (UNFRACTIONATED)
Heparin Unfractionated: 0.24 IU/mL — ABNORMAL LOW (ref 0.30–0.70)
Heparin Unfractionated: 0.3 IU/mL (ref 0.30–0.70)

## 2023-02-17 LAB — TSH: TSH: 1.287 u[IU]/mL (ref 0.350–4.500)

## 2023-02-17 LAB — BRAIN NATRIURETIC PEPTIDE: B Natriuretic Peptide: 60.3 pg/mL (ref 0.0–100.0)

## 2023-02-17 LAB — HIV ANTIBODY (ROUTINE TESTING W REFLEX): HIV Screen 4th Generation wRfx: NONREACTIVE

## 2023-02-17 MED ORDER — HEPARIN BOLUS VIA INFUSION
1600.0000 [IU] | Freq: Once | INTRAVENOUS | Status: AC
  Administered 2023-02-17: 1600 [IU] via INTRAVENOUS
  Filled 2023-02-17: qty 1600

## 2023-02-17 MED ORDER — SODIUM CHLORIDE 0.9 % IV SOLN: 250.0000 mL | INTRAVENOUS | Status: DC | PRN

## 2023-02-17 MED ORDER — ORAL CARE MOUTH RINSE: 15.0000 mL | OROMUCOSAL | Status: DC | PRN

## 2023-02-17 MED ORDER — SODIUM CHLORIDE 0.9% FLUSH
3.0000 mL | Freq: Two times a day (BID) | INTRAVENOUS | Status: DC
  Administered 2023-02-17 – 2023-02-19 (×4): 3 mL via INTRAVENOUS

## 2023-02-17 MED ORDER — SODIUM CHLORIDE 0.9% FLUSH: 3.0000 mL | INTRAVENOUS | Status: DC | PRN

## 2023-02-17 MED ORDER — AMLODIPINE BESYLATE 5 MG PO TABS
5.0000 mg | ORAL_TABLET | Freq: Every day | ORAL | Status: DC
  Administered 2023-02-17 – 2023-02-19 (×3): 5 mg via ORAL
  Filled 2023-02-17 (×3): qty 1

## 2023-02-17 MED ORDER — SODIUM CHLORIDE 0.9 % WEIGHT BASED INFUSION
1.0000 mL/kg/h | INTRAVENOUS | Status: DC
  Administered 2023-02-18: 1 mL/kg/h via INTRAVENOUS

## 2023-02-17 MED ORDER — SODIUM CHLORIDE 0.9 % WEIGHT BASED INFUSION
3.0000 mL/kg/h | INTRAVENOUS | Status: AC
  Administered 2023-02-18: 3 mL/kg/h via INTRAVENOUS

## 2023-02-17 MED ORDER — POTASSIUM CHLORIDE CRYS ER 20 MEQ PO TBCR
40.0000 meq | EXTENDED_RELEASE_TABLET | Freq: Once | ORAL | Status: AC
  Administered 2023-02-17: 40 meq via ORAL
  Filled 2023-02-17: qty 2

## 2023-02-17 MED ORDER — SPIRONOLACTONE 25 MG PO TABS
25.0000 mg | ORAL_TABLET | Freq: Every day | ORAL | Status: DC
  Administered 2023-02-18 – 2023-02-19 (×2): 25 mg via ORAL
  Filled 2023-02-17 (×2): qty 1

## 2023-02-17 MED ORDER — PERFLUTREN LIPID MICROSPHERE
1.0000 mL | INTRAVENOUS | Status: AC | PRN
  Administered 2023-02-17: 2 mL via INTRAVENOUS

## 2023-02-17 MED ORDER — ASPIRIN 81 MG PO CHEW
81.0000 mg | CHEWABLE_TABLET | ORAL | Status: AC
  Administered 2023-02-18: 81 mg via ORAL
  Filled 2023-02-17: qty 1

## 2023-02-17 NOTE — Progress Notes (Signed)
 Progress Note  Patient Name: Billy Davis Date of Encounter: 02/17/2023  Primary Cardiologist: Branch, Jonathan, MD   Patient Profile:    Billy Davis is a 67 y.o. male with PMHx of CAD s/p PCI in 2023, HTN, HLD, liver cirrhosis, type 2 DM with morbid obesity admitted from [facility] 02/16/2023 for the evaluation of chest pain.  Noted to have peripheral edema left-sided relieved by nitroglycerin hypertensive ER; IV diuresis  Tn 38>>57 Possible Brilinta side effects in the past, has been on aspirin alone; has declined statins in the past currently because of his liver issues.  Will plan was to refer to lipid clinic for PCSK9 Echo ordered Subjective   No further chest pressure or arm radiation; some midsternal discomfort which is distinct No dyspnea   Inpatient Medications    Scheduled Meds:  amLODipine  10 mg Oral Daily   aspirin EC  81 mg Oral Daily   carvedilol  12.5 mg Oral BID WC   furosemide  40 mg Intravenous BID   insulin aspart  0-5 Units Subcutaneous QHS   insulin aspart  0-9 Units Subcutaneous TID WC   insulin glargine-yfgn  25 Units Subcutaneous QHS   lisinopril  40 mg Oral Daily   pantoprazole  40 mg Oral Daily   Continuous Infusions:  heparin 1,350 Units/hr (02/17/23 0352)   PRN Meds: acetaminophen, nitroGLYCERIN, ondansetron (ZOFRAN) IV   Vital Signs    Vitals:   02/16/23 1800 02/16/23 1815 02/16/23 1830 02/16/23 2312  BP: (!) 147/72 (!) 152/78 134/77 (!) 156/82  Pulse: 70 66 67 66  Resp:   11 16  Temp:   97.7 F (36.5 C) 97.8 F (36.6 C)  TempSrc:   Oral Oral  SpO2: 94% 94% 93% 96%  Weight:      Height:        Intake/Output Summary (Last 24 hours) at 02/17/2023 0654 Last data filed at 02/17/2023 0352 Gross per 24 hour  Intake 111.52 ml  Output --  Net 111.52 ml   Filed Weights   02/16/23 1016 02/16/23 1024  Weight: (!) 138.3 kg 134.7 kg    Telemetry    Sinus- Personally Reviewed  ECG    6/22's 1014 no acute changes- Personally  Reviewed  Physical Exam    GEN: No acute distress.   Neck: No JVD Cardiac: RRR, no murmurs, rubs, or gallops.  Respiratory: Clear to auscultation bilaterally. GI: Soft, nontender, non-distended  MS: Right greater than left 2+ edema no deformity. Neuro:  Nonfocal  Psych: Normal affect   Labs    Chemistry Recent Labs  Lab 02/16/23 1042  NA 135  K 4.0  CL 105  CO2 22  GLUCOSE 281*  BUN 14  CREATININE 0.77  CALCIUM 8.6*  GFRNONAA >60  ANIONGAP 8     Hematology Recent Labs  Lab 02/16/23 1042  WBC 5.4  RBC 4.90  HGB 13.7  HCT 39.5  MCV 80.6  MCH 28.0  MCHC 34.7  RDW 13.8  PLT 114*    Cardiac EnzymesNo results for input(s): "TROPONINI" in the last 168 hours. No results for input(s): "TROPIPOC" in the last 168 hours.   BNPNo results for input(s): "BNP", "PROBNP" in the last 168 hours.   DDimer No results for input(s): "DDIMER" in the last 168 hours.   Radiology    DG Chest 2 View  Result Date: 02/16/2023 CLINICAL DATA:  Chest pain, shortness of breath EXAM: CHEST - 2 VIEW COMPARISON:  09/17/2021 FINDINGS: Stable cardiomegaly. Mildly   prominent interstitial markings bilaterally. No focal airspace consolidation, pleural effusion, or pneumothorax. IMPRESSION: Cardiomegaly with mild interstitial prominence which could reflect mild edema. Electronically Signed   By: Nicholas  Plundo D.O.   On: 02/16/2023 11:04    Cardiac Studies   Echo pending  Assessment & Plan    Acute coronary syndrome with positive troponin (non-STEMI)  Coronary artery disease with prior stenting  Hypertension poorly controlled  HFpEF/edema  Hyperlipidemia not on statins because of concerns of liver disease  Cirrhosis secondary to [place]  Diabetes-insulin-dependent  Morbid obesity     No further chest pain.  For catheterization tomorrow reviewed risks and benefits  Diuresing vigorously.  Will hold IV furosemide after the next dose  Will begin spironolactone tomorrow following  catheterization; I wonder if some part of his edema is related to amlodipine; will take the liberty of decreasing her from 10--5     For questions or updates, please contact [facility] Please consult [email or web address] for contact info under Cardiology/STEMI.      Signed, Irais Mottram, MD  02/17/2023, 6:54 AM    

## 2023-02-17 NOTE — Progress Notes (Signed)
ANTICOAGULATION CONSULT NOTE  Pharmacy Consult for heparin Indication: chest pain/ACS  Allergies  Allergen Reactions   Hydralazine Shortness Of Breath   Codeine Palpitations   Plavix [Clopidogrel Bisulfate] Rash    Patient Measurements: Height: 6\' 2"  (188 cm) Weight: 134.7 kg (297 lb) IBW/kg (Calculated) : 82.2 Heparin Dosing Weight: 112.3 kg  Vital Signs: Temp: 98 F (36.7 C) (06/23 1100) Temp Source: Oral (06/23 1100) BP: 127/87 (06/23 1100) Pulse Rate: 64 (06/23 1100)  Labs: Recent Labs    02/16/23 1042 02/16/23 1225 02/16/23 1430 02/17/23 0714 02/17/23 1435  HGB 13.7  --   --  14.5  --   HCT 39.5  --   --  42.7  --   PLT 114*  --   --  120*  --   HEPARINUNFRC  --   --   --  0.24* 0.30  CREATININE 0.77  --   --  0.72  --   TROPONINIHS 38* 49* 57*  --   --      Estimated Creatinine Clearance: 132.6 mL/min (by C-G formula based on SCr of 0.72 mg/dL).   Medical History: Past Medical History:  Diagnosis Date   Aneurysm (HCC)    right femoral pseudoaneurysm, thrombosed (following 03/20/07 cardiac cath)   CAD, multiple vessel    PCI circumflex 2008, diffuse disease of the right and LAD treated medically.,  //   Nuclear 2010 no ischemia   //   nuclear May, 2012 no ischemia   CHF (congestive heart failure) (HCC)    Cirrhosis of liver without mention of alcohol    Contrast media allergy    pt states he is not allergy to contrast   Diabetes mellitus without complication (HCC)    type 2   Diverticulosis    Dysphagia    Ejection fraction    EF 55%, nuclear, May, 2012   Esophageal reflux    IBS (irritable bowel syndrome)    Lower abdominal pain    Mixed dyslipidemia    Morbid obesity (HCC)    Other chronic nonalcoholic liver disease    Postsurgical percutaneous transluminal coronary angioplasty status    Preop cardiovascular exam    Cardiovascular clearance for prostate surgery October, 2013   Prostate cancer (HCC) 03/24/2012   Renal insufficiency     Unspecified diastolic heart failure    Unspecified essential hypertension     Medications:  Scheduled:   amLODipine  5 mg Oral Daily   aspirin EC  81 mg Oral Daily   carvedilol  12.5 mg Oral BID WC   insulin aspart  0-5 Units Subcutaneous QHS   insulin aspart  0-9 Units Subcutaneous TID WC   insulin glargine-yfgn  25 Units Subcutaneous QHS   lisinopril  40 mg Oral Daily   pantoprazole  40 mg Oral Daily   [START ON 02/18/2023] spironolactone  25 mg Oral Daily    Assessment: 67 yo male presented to [facility] with chest pain, ShOB, diarrhea. Prev history of CAD w/ BMS to LCX in 2008 and DES to LAD in 2023 currently on Prasugrel. No anticoagulation noted PTA and has not received any this admit thus far. Pharmacy consulted to dose IV heparin for ACS.   PM update: Heparin level therapeutic at 0.30 but at low end of goal. No issues with heparin infusing or bleeding noted per d/w RN. Planning for cath tomorrow.   Goal of Therapy:  Heparin level 0.3-0.7 units/ml Monitor platelets by anticoagulation protocol: Yes   Plan:  Increase  heparin drip to 1650 units/hr Daily heparin level and CBC F/u hep plans post cath 6/24  Rexford Maus, PharmD, BCPS 02/17/2023 3:23 PM

## 2023-02-17 NOTE — H&P (View-Only) (Signed)
Progress Note  Patient Name: Billy Davis Date of Encounter: 02/17/2023  Primary Cardiologist: Dina Rich, MD   Patient Profile:    Billy Davis is a 67 y.o. male with PMHx of CAD s/p PCI in 2023, HTN, HLD, liver cirrhosis, type 2 DM with morbid obesity admitted from [facility] 02/16/2023 for the evaluation of chest pain.  Noted to have peripheral edema left-sided relieved by nitroglycerin hypertensive ER; IV diuresis  Tn 38>>57 Possible Brilinta side effects in the past, has been on aspirin alone; has declined statins in the past currently because of his liver issues.  Will plan was to refer to lipid clinic for PCSK9 Echo ordered Subjective   No further chest pressure or arm radiation; some midsternal discomfort which is distinct No dyspnea   Inpatient Medications    Scheduled Meds:  amLODipine  10 mg Oral Daily   aspirin EC  81 mg Oral Daily   carvedilol  12.5 mg Oral BID WC   furosemide  40 mg Intravenous BID   insulin aspart  0-5 Units Subcutaneous QHS   insulin aspart  0-9 Units Subcutaneous TID WC   insulin glargine-yfgn  25 Units Subcutaneous QHS   lisinopril  40 mg Oral Daily   pantoprazole  40 mg Oral Daily   Continuous Infusions:  heparin 1,350 Units/hr (02/17/23 0352)   PRN Meds: acetaminophen, nitroGLYCERIN, ondansetron (ZOFRAN) IV   Vital Signs    Vitals:   02/16/23 1800 02/16/23 1815 02/16/23 1830 02/16/23 2312  BP: (!) 147/72 (!) 152/78 134/77 (!) 156/82  Pulse: 70 66 67 66  Resp:   11 16  Temp:   97.7 F (36.5 C) 97.8 F (36.6 C)  TempSrc:   Oral Oral  SpO2: 94% 94% 93% 96%  Weight:      Height:        Intake/Output Summary (Last 24 hours) at 02/17/2023 0654 Last data filed at 02/17/2023 0352 Gross per 24 hour  Intake 111.52 ml  Output --  Net 111.52 ml   Filed Weights   02/16/23 1016 02/16/23 1024  Weight: (!) 138.3 kg 134.7 kg    Telemetry    Sinus- Personally Reviewed  ECG    6/22's 1014 no acute changes- Personally  Reviewed  Physical Exam    GEN: No acute distress.   Neck: No JVD Cardiac: RRR, no murmurs, rubs, or gallops.  Respiratory: Clear to auscultation bilaterally. GI: Soft, nontender, non-distended  MS: Right greater than left 2+ edema no deformity. Neuro:  Nonfocal  Psych: Normal affect   Labs    Chemistry Recent Labs  Lab 02/16/23 1042  NA 135  K 4.0  CL 105  CO2 22  GLUCOSE 281*  BUN 14  CREATININE 0.77  CALCIUM 8.6*  GFRNONAA >60  ANIONGAP 8     Hematology Recent Labs  Lab 02/16/23 1042  WBC 5.4  RBC 4.90  HGB 13.7  HCT 39.5  MCV 80.6  MCH 28.0  MCHC 34.7  RDW 13.8  PLT 114*    Cardiac EnzymesNo results for input(s): "TROPONINI" in the last 168 hours. No results for input(s): "TROPIPOC" in the last 168 hours.   BNPNo results for input(s): "BNP", "PROBNP" in the last 168 hours.   DDimer No results for input(s): "DDIMER" in the last 168 hours.   Radiology    DG Chest 2 View  Result Date: 02/16/2023 CLINICAL DATA:  Chest pain, shortness of breath EXAM: CHEST - 2 VIEW COMPARISON:  09/17/2021 FINDINGS: Stable cardiomegaly. Mildly  prominent interstitial markings bilaterally. No focal airspace consolidation, pleural effusion, or pneumothorax. IMPRESSION: Cardiomegaly with mild interstitial prominence which could reflect mild edema. Electronically Signed   By: Duanne Guess D.O.   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  For catheterization tomorrow reviewed risks and benefits  Diuresing vigorously.  Will hold IV furosemide after the next dose  Will begin spironolactone tomorrow following  catheterization; I wonder if some part of his edema is related to amlodipine; will take the liberty of decreasing her from 10--5     For questions or updates, please contact [facility] Please consult [email or web address] for contact info under Cardiology/STEMI.      Signed, Sherryl Manges, MD  02/17/2023, 6:54 AM

## 2023-02-17 NOTE — Progress Notes (Signed)
ANTICOAGULATION CONSULT NOTE  Pharmacy Consult for heparin Indication: chest pain/ACS  Allergies  Allergen Reactions   Hydralazine Shortness Of Breath   Codeine Palpitations   Plavix [Clopidogrel Bisulfate] Rash    Patient Measurements: Height: 6\' 2"  (188 cm) Weight: 134.7 kg (297 lb) IBW/kg (Calculated) : 82.2 Heparin Dosing Weight: 112.3 kg  Vital Signs: Temp: 97.8 F (36.6 C) (06/22 2312) Temp Source: Oral (06/22 2312) BP: 156/86 (06/23 0749) Pulse Rate: 66 (06/22 2312)  Labs: Recent Labs    02/16/23 1042 02/16/23 1225 02/16/23 1430 02/17/23 0714  HGB 13.7  --   --  14.5  HCT 39.5  --   --  42.7  PLT 114*  --   --  120*  HEPARINUNFRC  --   --   --  0.24*  CREATININE 0.77  --   --  0.72  TROPONINIHS 38* 49* 57*  --      Estimated Creatinine Clearance: 132.6 mL/min (by C-G formula based on SCr of 0.72 mg/dL).   Medical History: Past Medical History:  Diagnosis Date   Aneurysm (HCC)    right femoral pseudoaneurysm, thrombosed (following 03/20/07 cardiac cath)   CAD, multiple vessel    PCI circumflex 2008, diffuse disease of the right and LAD treated medically.,  //   Nuclear 2010 no ischemia   //   nuclear May, 2012 no ischemia   CHF (congestive heart failure) (HCC)    Cirrhosis of liver without mention of alcohol    Contrast media allergy    pt states he is not allergy to contrast   Diabetes mellitus without complication (HCC)    type 2   Diverticulosis    Dysphagia    Ejection fraction    EF 55%, nuclear, May, 2012   Esophageal reflux    IBS (irritable bowel syndrome)    Lower abdominal pain    Mixed dyslipidemia    Morbid obesity (HCC)    Other chronic nonalcoholic liver disease    Postsurgical percutaneous transluminal coronary angioplasty status    Preop cardiovascular exam    Cardiovascular clearance for prostate surgery October, 2013   Prostate cancer (HCC) 03/24/2012   Renal insufficiency    Unspecified diastolic heart failure     Unspecified essential hypertension     Medications:  Scheduled:   amLODipine  5 mg Oral Daily   aspirin EC  81 mg Oral Daily   carvedilol  12.5 mg Oral BID WC   insulin aspart  0-5 Units Subcutaneous QHS   insulin aspart  0-9 Units Subcutaneous TID WC   insulin glargine-yfgn  25 Units Subcutaneous QHS   lisinopril  40 mg Oral Daily   pantoprazole  40 mg Oral Daily   [START ON 02/18/2023] spironolactone  25 mg Oral Daily    Assessment: 67 yo male presented to [facility] with chest pain, ShOB, diarrhea. Prev history of CAD w/ BMS to LCX in 2008 and DES to LAD in 2023 currently on Prasugrel. No anticoagulation noted PTA and has not received any this admit thus far. Pharmacy consulted to dose IV heparin for ACS.   Troponins 38 >> 49 >> 57. Hgb 13-14s, plts 120. Heparin level subtherapeutic at 0.24. No issues with infusion or bleeding noted.   Goal of Therapy:  Heparin level 0.3-0.7 units/ml Monitor platelets by anticoagulation protocol: Yes   Plan:  Heparin 1600 units IV x1 bolus Increase heparin drip to 1550 units/hr Heparin level in 6 hours Daily heparin level and CBC F/u cardiology recs  Andreas Ohm, PharmD Pharmacy Resident  02/17/2023 8:16 AM

## 2023-02-18 ENCOUNTER — Encounter (HOSPITAL_COMMUNITY)
Admission: EM | Disposition: A | Discharge status: Home / Self Care | Payer: Self-pay | Source: Home / Self Care | Attending: Internal Medicine

## 2023-02-18 ENCOUNTER — Other Ambulatory Visit (HOSPITAL_COMMUNITY): Payer: Self-pay

## 2023-02-18 DIAGNOSIS — I110 Hypertensive heart disease with heart failure: Secondary | ICD-10-CM | POA: Diagnosis not present

## 2023-02-18 DIAGNOSIS — I2510 Atherosclerotic heart disease of native coronary artery without angina pectoris: Secondary | ICD-10-CM | POA: Diagnosis not present

## 2023-02-18 DIAGNOSIS — I214 Non-ST elevation (NSTEMI) myocardial infarction: Secondary | ICD-10-CM | POA: Diagnosis not present

## 2023-02-18 DIAGNOSIS — I5033 Acute on chronic diastolic (congestive) heart failure: Secondary | ICD-10-CM

## 2023-02-18 HISTORY — PX: CORONARY STENT INTERVENTION: CATH118234

## 2023-02-18 HISTORY — PX: LEFT HEART CATH AND CORONARY ANGIOGRAPHY: CATH118249

## 2023-02-18 LAB — BASIC METABOLIC PANEL
Anion gap: 9 (ref 5–15)
BUN: 13 mg/dL (ref 8–23)
CO2: 23 mmol/L (ref 22–32)
Calcium: 8.6 mg/dL — ABNORMAL LOW (ref 8.9–10.3)
Chloride: 107 mmol/L (ref 98–111)
Creatinine, Ser: 0.77 mg/dL (ref 0.61–1.24)
GFR, Estimated: >60 mL/min (ref >60)
Glucose, Bld: 172 mg/dL — ABNORMAL HIGH (ref 70–99)
Potassium: 3.9 mmol/L (ref 3.5–5.1)
Sodium: 139 mmol/L (ref 135–145)

## 2023-02-18 LAB — CBC
HCT: 42.3 % (ref 39.0–52.0)
Hemoglobin: 14.6 g/dL (ref 13.0–17.0)
MCH: 28.1 pg (ref 26.0–34.0)
MCHC: 34.5 g/dL (ref 30.0–36.0)
MCV: 81.5 fL (ref 80.0–100.0)
Platelets: 104 10*3/uL — ABNORMAL LOW (ref 150–400)
RBC: 5.19 MIL/uL (ref 4.22–5.81)
RDW: 13.6 % (ref 11.5–15.5)
WBC: 5.4 10*3/uL (ref 4.0–10.5)
nRBC: 0 % (ref 0.0–0.2)

## 2023-02-18 LAB — HEPARIN LEVEL (UNFRACTIONATED): Heparin Unfractionated: 0.35 IU/mL (ref 0.30–0.70)

## 2023-02-18 LAB — GLUCOSE, CAPILLARY
Glucose-Capillary: 190 mg/dL — ABNORMAL HIGH (ref 70–99)
Glucose-Capillary: 164 mg/dL — ABNORMAL HIGH (ref 70–99)
Glucose-Capillary: 274 mg/dL — ABNORMAL HIGH (ref 70–99)
Glucose-Capillary: 280 mg/dL — ABNORMAL HIGH (ref 70–99)

## 2023-02-18 LAB — POCT ACTIVATED CLOTTING TIME
Activated Clotting Time: 238 seconds
Activated Clotting Time: 324 seconds

## 2023-02-18 SURGERY — CORONARY STENT INTERVENTION
Anesthesia: LOCAL

## 2023-02-18 MED ORDER — PRASUGREL HCL 10 MG PO TABS
10.0000 mg | ORAL_TABLET | Freq: Every day | ORAL | Status: DC
  Administered 2023-02-19: 10 mg via ORAL
  Filled 2023-02-18: qty 1

## 2023-02-18 MED ORDER — HEPARIN (PORCINE) IN NACL 1000-0.9 UT/500ML-% IV SOLN
INTRAVENOUS | Status: DC | PRN
  Administered 2023-02-18 (×2): 500 mL via INTRA_ARTERIAL

## 2023-02-18 MED ORDER — HEPARIN SODIUM (PORCINE) 1000 UNIT/ML IJ SOLN
INTRAMUSCULAR | Status: DC | PRN
  Administered 2023-02-18: 5000 [IU] via INTRAVENOUS
  Administered 2023-02-18: 6500 [IU] via INTRAVENOUS
  Administered 2023-02-18: 5000 [IU] via INTRAVENOUS

## 2023-02-18 MED ORDER — PRASUGREL HCL 10 MG PO TABS
ORAL_TABLET | ORAL | Status: DC | PRN
  Administered 2023-02-18: 60 mg via ORAL

## 2023-02-18 MED ORDER — IOHEXOL 350 MG/ML SOLN
INTRAVENOUS | Status: DC | PRN
  Administered 2023-02-18: 215 mL

## 2023-02-18 MED ORDER — LIDOCAINE HCL (PF) 1 % IJ SOLN
INTRAMUSCULAR | Status: AC
  Filled 2023-02-18: qty 30

## 2023-02-18 MED ORDER — MIDAZOLAM HCL 2 MG/2ML IJ SOLN
INTRAMUSCULAR | Status: AC
  Filled 2023-02-18: qty 2

## 2023-02-18 MED ORDER — PRASUGREL HCL 10 MG PO TABS
ORAL_TABLET | ORAL | Status: AC
  Filled 2023-02-18: qty 6

## 2023-02-18 MED ORDER — VERAPAMIL HCL 2.5 MG/ML IV SOLN
INTRAVENOUS | Status: AC
  Filled 2023-02-18: qty 2

## 2023-02-18 MED ORDER — FENTANYL CITRATE (PF) 100 MCG/2ML IJ SOLN
INTRAMUSCULAR | Status: DC | PRN
  Administered 2023-02-18 (×2): 25 ug via INTRAVENOUS
  Administered 2023-02-18: 50 ug via INTRAVENOUS

## 2023-02-18 MED ORDER — NITROGLYCERIN 1 MG/10 ML FOR IR/CATH LAB
INTRA_ARTERIAL | Status: AC
  Filled 2023-02-18: qty 10

## 2023-02-18 MED ORDER — INSULIN GLARGINE-YFGN 100 UNIT/ML ~~LOC~~ SOLN
30.0000 [IU] | Freq: Every day | SUBCUTANEOUS | Status: DC
  Administered 2023-02-18: 30 [IU] via SUBCUTANEOUS
  Filled 2023-02-18 (×3): qty 0.3

## 2023-02-18 MED ORDER — VERAPAMIL HCL 2.5 MG/ML IV SOLN
INTRAVENOUS | Status: DC | PRN
  Administered 2023-02-18: 10 mL via INTRA_ARTERIAL

## 2023-02-18 MED ORDER — FENTANYL CITRATE (PF) 100 MCG/2ML IJ SOLN
INTRAMUSCULAR | Status: AC
  Filled 2023-02-18: qty 2

## 2023-02-18 MED ORDER — HEPARIN SODIUM (PORCINE) 1000 UNIT/ML IJ SOLN
INTRAMUSCULAR | Status: AC
  Filled 2023-02-18: qty 10

## 2023-02-18 MED ORDER — MIDAZOLAM HCL 2 MG/2ML IJ SOLN
INTRAMUSCULAR | Status: DC | PRN
  Administered 2023-02-18 (×2): 2 mg via INTRAVENOUS

## 2023-02-18 MED ORDER — LIDOCAINE HCL (PF) 1 % IJ SOLN
INTRAMUSCULAR | Status: DC | PRN
  Administered 2023-02-18: 5 mL

## 2023-02-18 MED ORDER — CARVEDILOL 25 MG PO TABS
25.0000 mg | ORAL_TABLET | Freq: Two times a day (BID) | ORAL | Status: DC
  Administered 2023-02-18 – 2023-02-19 (×2): 25 mg via ORAL
  Filled 2023-02-18 (×2): qty 1

## 2023-02-18 MED ORDER — PRASUGREL HCL 10 MG PO TABS
ORAL_TABLET | ORAL | Status: AC
  Filled 2023-02-18: qty 1

## 2023-02-18 SURGICAL SUPPLY — 32 items
BALL SAPPHIRE NC24 4.0X15 (BALLOONS) ×1
BALLN EMERGE MR 3.0X15 (BALLOONS) ×1
BALLN ~~LOC~~ EMERGE MR 3.5X12 (BALLOONS) ×1
BALLOON EMERGE MR 3.0X15 (BALLOONS) IMPLANT
BALLOON SAPPHIRE NC24 4.0X15 (BALLOONS) IMPLANT
BALLOON ~~LOC~~ EMERGE MR 3.5X12 (BALLOONS) IMPLANT
CATH INFINITI 5 FR JL3.5 (CATHETERS) IMPLANT
CATH INFINITI 5FR JL4 (CATHETERS) IMPLANT
CATH INFINITI AMBI 5FR TG (CATHETERS) IMPLANT
CATH INFINITI JR4 5F (CATHETERS) IMPLANT
CATH LAUNCHER 6FR AL.75 (CATHETERS) IMPLANT
CATH LAUNCHER 6FR EBU 3.75 (CATHETERS) IMPLANT
CATH VISTA GUIDE 6FR JR4 (CATHETERS) IMPLANT
DEVICE RAD COMP TR BAND LRG (VASCULAR PRODUCTS) IMPLANT
GLIDESHEATH SLEND SS 6F .021 (SHEATH) IMPLANT
GUIDEWIRE INQWIRE 1.5J.035X260 (WIRE) IMPLANT
INQWIRE 1.5J .035X260CM (WIRE) ×1
KIT ENCORE 26 ADVANTAGE (KITS) IMPLANT
KIT HEART LEFT (KITS) ×1 IMPLANT
PACK CARDIAC CATHETERIZATION (CUSTOM PROCEDURE TRAY) ×1 IMPLANT
SHEATH PROBE COVER 6X72 (BAG) IMPLANT
STENT SYNERGY XD 3.0X20 (Permanent Stent) IMPLANT
STENT SYNERGY XD 3.50X20 (Permanent Stent) IMPLANT
STENT SYNERGY XD 4.0X32 (Permanent Stent) IMPLANT
SYNERGY XD 3.0X20 (Permanent Stent) ×1 IMPLANT
SYNERGY XD 3.50X20 (Permanent Stent) ×1 IMPLANT
SYNERGY XD 4.0X32 (Permanent Stent) ×1 IMPLANT
TRANSDUCER W/STOPCOCK (MISCELLANEOUS) ×1 IMPLANT
TUBING CIL FLEX 10 FLL-RA (TUBING) ×1 IMPLANT
VALVE GUARDIAN II ~~LOC~~ HEMO (MISCELLANEOUS) IMPLANT
WIRE HI TORQ WHISPER MS 190CM (WIRE) IMPLANT
WIRE RUNTHROUGH .014X180CM (WIRE) IMPLANT

## 2023-02-18 NOTE — TOC CM/SW Note (Signed)
Transition of Care [facility]) - Inpatient Brief Assessment   Patient Details  Name: Billy Davis MRN: [id number] Date of Birth: 11-02-1955  Transition of Care [facility]) CM/SW Contact:    Gala Lewandowsky, RN Phone Number: 02/18/2023, 11:19 AM   Clinical Narrative: Patient presented for chest pain-plan [facility] today. PTA patient was independent from home with spouse. Spouse was at the bedside and states patient has DME cane. Case Manager will continue to follow for transition of care needs as the patient progresses.    Transition of Care Asessment: Insurance and Status: Insurance coverage has been reviewed Patient has primary care physician: Yes Home environment has been reviewed: reviewed Prior level of function:: independent Prior/Current Home Services: No current home services Social Determinants of Health Reivew: SDOH reviewed no interventions necessary Readmission risk has been reviewed: Yes Transition of care needs: no transition of care needs at this time

## 2023-02-18 NOTE — TOC Benefit Eligibility Note (Signed)
Pharmacy Patient Advocate Encounter  Insurance verification completed.    The patient is insured through [facility] Medicare Part D  Ran test claim for Farxiga 10 mg and the current 30 day co-pay is $47.00.  Ran test claim for Jardiance 10 mg and the current 30 day co-pay is $47.00.   This test claim was processed through [facility]- copay amounts may vary at other pharmacies due to pharmacy/plan contracts, or as the patient moves through the different stages of their insurance plan.    Roland Earl, CPHT Pharmacy Patient Advocate Specialist [facility] Patient Advocate Team Direct Number: [phone]  Fax: [phone]

## 2023-02-18 NOTE — Inpatient Diabetes Management (Signed)
Inpatient Diabetes Program Recommendations  AACE/ADA: New Consensus Statement on Inpatient Glycemic Control (2015)  Target Ranges:  Prepandial:   less than 140 mg/dL      Peak postprandial:   less than 180 mg/dL (1-2 hours)      Critically ill patients:  140 - 180 mg/dL   Lab Results  Component Value Date   GLUCAP 190 (H) 02/18/2023   HGBA1C 9.4 (H) 02/17/2023    Review of Glycemic Control  Latest Reference Range & Units 02/17/23 07:40 02/17/23 12:27 02/17/23 16:32 02/17/23 21:40 02/18/23 07:47  Glucose-Capillary 70 - 99 mg/dL 161 (H) 096 (H) 045 (H) 251 (H) 190 (H)  (H): Data is abnormally high Diabetes history: Type 2 DM Outpatient Diabetes medications: Lantus 40 units QA/50 units QP Current orders for Inpatient glycemic control: Semglee 25 units at bedtime, Novolog 0-9 units TID & HS  Inpatient Diabetes Program Recommendations:    Consider increasing Semglee 30 units at bedtime and adding Novolog 3 units TID (once diet advances and assuming patient consuming >50% of meals).  Thanks, Lujean Rave, MSN, RNC-OB Diabetes Coordinator [phone] (8a-5p)

## 2023-02-18 NOTE — Progress Notes (Signed)
ANTICOAGULATION CONSULT NOTE  Pharmacy Consult for heparin Indication: chest pain/ACS  Allergies  Allergen Reactions   Apresoline [Hydralazine] Shortness Of Breath   Codeine Palpitations   Plavix [Clopidogrel Bisulfate] Rash    Patient Measurements: Height: 6\' 2"  (188 cm) Weight: 131.9 kg (290 lb 11.2 oz) IBW/kg (Calculated) : 82.2 Heparin Dosing Weight: 112.3 kg  Vital Signs: Temp: 98 F (36.7 C) (06/24 0750) Temp Source: Oral (06/24 0750) BP: 162/90 (06/24 0750) Pulse Rate: 61 (06/24 0938)  Labs: Recent Labs    02/16/23 1042 02/16/23 1225 02/16/23 1430 02/17/23 0714 02/17/23 1435 02/18/23 0236  HGB 13.7  --   --  14.5  --  14.6  HCT 39.5  --   --  42.7  --  42.3  PLT 114*  --   --  120*  --  104*  HEPARINUNFRC  --   --   --  0.24* 0.30 0.35  CREATININE 0.77  --   --  0.72  --   --   TROPONINIHS 38* 49* 57*  --   --   --      Estimated Creatinine Clearance: 131.2 mL/min (by C-G formula based on SCr of 0.72 mg/dL).   Medical History: Past Medical History:  Diagnosis Date   Aneurysm (HCC)    right femoral pseudoaneurysm, thrombosed (following 03/20/07 cardiac cath)   CAD, multiple vessel    PCI circumflex 2008, diffuse disease of the right and LAD treated medically.,  //   Nuclear 2010 no ischemia   //   nuclear May, 2012 no ischemia   CHF (congestive heart failure) (HCC)    Cirrhosis of liver without mention of alcohol    Contrast media allergy    pt states he is not allergy to contrast   Diabetes mellitus without complication (HCC)    type 2   Diverticulosis    Dysphagia    Ejection fraction    EF 55%, nuclear, May, 2012   Esophageal reflux    IBS (irritable bowel syndrome)    Lower abdominal pain    Mixed dyslipidemia    Morbid obesity (HCC)    Other chronic nonalcoholic liver disease    Postsurgical percutaneous transluminal coronary angioplasty status    Preop cardiovascular exam    Cardiovascular clearance for prostate surgery October, 2013    Prostate cancer (HCC) 03/24/2012   Renal insufficiency    Unspecified diastolic heart failure    Unspecified essential hypertension     Medications:  Scheduled:   amLODipine  5 mg Oral Daily   aspirin EC  81 mg Oral Daily   carvedilol  12.5 mg Oral BID WC   insulin aspart  0-5 Units Subcutaneous QHS   insulin aspart  0-9 Units Subcutaneous TID WC   insulin glargine-yfgn  25 Units Subcutaneous QHS   lisinopril  40 mg Oral Daily   pantoprazole  40 mg Oral Daily   sodium chloride flush  3 mL Intravenous Q12H   spironolactone  25 mg Oral Daily    Assessment: 67 yo male presented to [facility] ED with chest pain, ShOB, diarrhea. Prev history of CAD w/ BMS to LCX in 2008 and DES to LAD in 2023 currently on Prasugrel. No anticoagulation noted PTA and has not received any this admit thus far. Pharmacy consulted to dose IV heparin for ACS.   Heparin level therapeutic, CBC stable.  Goal of Therapy:  Heparin level 0.3-0.7 units/ml Monitor platelets by anticoagulation protocol: Yes   Plan:  Continue heparin 1650  units/hr F/u hep plans post cath 6/24  Fredonia Highland, PharmD, [place], [facility] Clinical Pharmacist [phone] Please check AMION for all [facility] Pharmacy numbers 02/18/2023

## 2023-02-18 NOTE — Progress Notes (Addendum)
Rounding Note    Patient Name: Billy Davis Date of Encounter: 02/18/2023  [facility] Cardiologist: Dina Rich, MD   Subjective   Brief episodes of chest discomfort at times, much better than when he presented.   Inpatient Medications    Scheduled Meds:  amLODipine  5 mg Oral Daily   aspirin EC  81 mg Oral Daily   carvedilol  12.5 mg Oral BID WC   insulin aspart  0-5 Units Subcutaneous QHS   insulin aspart  0-9 Units Subcutaneous TID WC   insulin glargine-yfgn  25 Units Subcutaneous QHS   lisinopril  40 mg Oral Daily   pantoprazole  40 mg Oral Daily   sodium chloride flush  3 mL Intravenous Q12H   spironolactone  25 mg Oral Daily   Continuous Infusions:  sodium chloride     sodium chloride 1 mL/kg/hr (02/18/23 0507)   heparin 1,650 Units/hr (02/18/23 0306)   PRN Meds: sodium chloride, acetaminophen, nitroGLYCERIN, ondansetron (ZOFRAN) IV, mouth rinse, sodium chloride flush   Vital Signs    Vitals:   02/17/23 2030 02/18/23 0510 02/18/23 0750 02/18/23 0938  BP: (!) 140/71 (!) 157/92 (!) 162/90   Pulse: 79 64 61 61  Resp: 18 20 19    Temp: 98 F (36.7 C) 97.6 F (36.4 C) 98 F (36.7 C)   TempSrc: Oral Oral Oral   SpO2:  98% 96%   Weight:  131.9 kg    Height:        Intake/Output Summary (Last 24 hours) at 02/18/2023 0944 Last data filed at 02/18/2023 0600 Gross per 24 hour  Intake 957.73 ml  Output 400 ml  Net 557.73 ml      02/18/2023    5:10 AM 02/16/2023   10:24 AM 02/16/2023   10:16 AM  Last 3 Weights  Weight (lbs) 290 lb 11.2 oz 297 lb 305 lb  Weight (kg) 131.861 kg 134.718 kg 138.347 kg      Telemetry    Sinus Rhythm - Personally Reviewed  ECG    Sinus Rhythm, 62 bpm nonspecific changes - Personally Reviewed  Physical Exam   GEN: No acute distress.   Neck: No JVD Cardiac: RRR, no murmurs, rubs, or gallops.  Respiratory: Clear to auscultation bilaterally. GI: Soft, nontender, non-distended  MS: No edema; No  deformity. Neuro:  Nonfocal  Psych: Normal affect   Labs    High Sensitivity Troponin:   Recent Labs  Lab 02/16/23 1042 02/16/23 1225 02/16/23 1430  TROPONINIHS 38* 49* 57*     Chemistry Recent Labs  Lab 02/16/23 1042 02/17/23 0714  NA 135 140  K 4.0 3.5  CL 105 101  CO2 22 23  GLUCOSE 281* 184*  BUN 14 11  CREATININE 0.77 0.72  CALCIUM 8.6* 8.9  PROT  --  6.6  ALBUMIN  --  3.7  AST  --  29  ALT  --  35  ALKPHOS  --  67  BILITOT  --  1.2  GFRNONAA >60 >60  ANIONGAP 8 16*    Lipids  Recent Labs  Lab 02/17/23 0714  CHOL 236*  TRIG 118  HDL 54  LDLCALC 158*  CHOLHDL 4.4    Hematology Recent Labs  Lab 02/16/23 1042 02/17/23 0714 02/18/23 0236  WBC 5.4 5.7 5.4  RBC 4.90 5.36 5.19  HGB 13.7 14.5 14.6  HCT 39.5 42.7 42.3  MCV 80.6 79.7* 81.5  MCH 28.0 27.1 28.1  MCHC 34.7 34.0 34.5  RDW 13.8  13.7 13.6  PLT 114* 120* 104*   Thyroid  Recent Labs  Lab 02/17/23 0714  TSH 1.287  FREET4 1.04    BNP Recent Labs  Lab 02/17/23 0714  BNP 60.3    DDimer No results for input(s): "DDIMER" in the last 168 hours.   Radiology    ECHOCARDIOGRAM COMPLETE  Result Date: 02/17/2023    ECHOCARDIOGRAM REPORT   Patient Name:   Billy Davis Date of Exam: 02/17/2023 Medical Rec #:  [id number]      Height:       74.0 in Accession #:    [id number]     Weight:       297.0 lb Date of Birth:  [birth year]      BSA:          2.571 m Patient Age:    67 years       BP:           156/86 mmHg Patient Gender: M              HR:           62 bpm. Exam Location:  Inpatient Procedure: 2D Echo, Cardiac Doppler, Color Doppler and Intracardiac            Opacification Agent Indications:    NSTEMI  History:        Patient has prior history of Echocardiogram examinations, most                 recent 04/23/2022. CHF, CAD; Risk Factors:Diabetes.  Sonographer:    Darlys Gales Referring Phys: [id number] EZEQUIEL D MUNOZ GONZALEZ IMPRESSIONS  1. Left ventricular ejection fraction, by estimation, is  50 to 55%. The left ventricle has low normal function. The left ventricle has no regional wall motion abnormalities. The left ventricular internal cavity size was moderately dilated. There is mild  concentric left ventricular hypertrophy. Left ventricular diastolic parameters were normal.  2. Right ventricular systolic function is normal. The right ventricular size is normal.  3. Right atrial size was mildly dilated.  4. The mitral valve is grossly normal. No evidence of mitral valve regurgitation. No evidence of mitral stenosis.  5. The aortic valve is grossly normal. There is mild calcification of the aortic valve. There is mild thickening of the aortic valve. Aortic valve regurgitation is not visualized. Aortic valve sclerosis is present, with no evidence of aortic valve stenosis.  6. Aortic dilatation noted. There is borderline dilatation of the ascending aorta, measuring 38 mm. Comparison(s): Changes from prior study are noted. EF slightly less than prior but no focal wall motion abnormalities. Conclusion(s)/Recommendation(s): Otherwise normal echocardiogram, with minor abnormalities described in the report. FINDINGS  Left Ventricle: Left ventricular ejection fraction, by estimation, is 50 to 55%. The left ventricle has low normal function. The left ventricle has no regional wall motion abnormalities. Definity contrast agent was given IV to delineate the left ventricular endocardial borders. The left ventricular internal cavity size was moderately dilated. There is mild concentric left ventricular hypertrophy. Left ventricular diastolic parameters were normal. Right Ventricle: The right ventricular size is normal. Right vetricular wall thickness was not well visualized. Right ventricular systolic function is normal. Left Atrium: Left atrial size was normal in size. Right Atrium: Right atrial size was mildly dilated. Pericardium: There is no evidence of pericardial effusion. Mitral Valve: The mitral valve is  grossly normal. No evidence of mitral valve regurgitation. No evidence of mitral valve stenosis. Tricuspid Valve: The tricuspid valve is grossly  normal. Tricuspid valve regurgitation is not demonstrated. No evidence of tricuspid stenosis. Aortic Valve: The aortic valve is grossly normal. There is mild calcification of the aortic valve. There is mild thickening of the aortic valve. Aortic valve regurgitation is not visualized. Aortic valve sclerosis is present, with no evidence of aortic valve stenosis. Aortic valve mean gradient measures 3.0 mmHg. Aortic valve peak gradient measures 5.2 mmHg. Aortic valve area, by VTI measures 3.45 cm. Pulmonic Valve: The pulmonic valve was not well visualized. Pulmonic valve regurgitation is not visualized. No evidence of pulmonic stenosis. Aorta: Aortic dilatation noted. There is borderline dilatation of the ascending aorta, measuring 38 mm. Venous: The inferior vena cava was not well visualized. IAS/Shunts: The interatrial septum was not well visualized.  LEFT VENTRICLE PLAX 2D LVIDd:         6.00 cm   Diastology LVIDs:         4.30 cm   LV e' medial:    4.90 cm/s LV PW:         1.30 cm   LV E/e' medial:  8.6 LV IVS:        1.40 cm   LV e' lateral:   4.03 cm/s LVOT diam:     2.00 cm   LV E/e' lateral: 10.4 LV SV:         70 LV SV Index:   27 LVOT Area:     3.14 cm  RIGHT VENTRICLE RV S prime:     19.10 cm/s TAPSE (M-mode): 2.5 cm LEFT ATRIUM             Index        RIGHT ATRIUM           Index LA Vol (A2C):   62.1 ml 24.15 ml/m  RA Area:     21.80 cm LA Vol (A4C):   49.2 ml 19.13 ml/m  RA Volume:   64.20 ml  24.97 ml/m LA Biplane Vol: 55.7 ml 21.66 ml/m  AORTIC VALVE AV Area (Vmax):    2.73 cm AV Area (Vmean):   3.01 cm AV Area (VTI):     3.45 cm AV Vmax:           114.00 cm/s AV Vmean:          85.200 cm/s AV VTI:            0.203 m AV Peak Grad:      5.2 mmHg AV Mean Grad:      3.0 mmHg LVOT Vmax:         99.00 cm/s LVOT Vmean:        81.600 cm/s LVOT VTI:           0.223 m LVOT/AV VTI ratio: 1.10  AORTA Ao Root diam: 3.80 cm Ao Asc diam:  3.80 cm MITRAL VALVE MV Area (PHT): 2.76 cm    SHUNTS MV Decel Time: 275 msec    Systemic VTI:  0.22 m MV E velocity: 41.90 cm/s  Systemic Diam: 2.00 cm MV A velocity: 71.30 cm/s MV E/A ratio:  0.59 Jodelle Red MD Electronically signed by Jodelle Red MD Signature Date/Time: 02/17/2023/12:34:44 PM    Final    DG Chest 2 View  Result Date: 02/16/2023 CLINICAL DATA:  Chest pain, shortness of breath EXAM: CHEST - 2 VIEW COMPARISON:  09/17/2021 FINDINGS: Stable cardiomegaly. Mildly prominent interstitial markings bilaterally. No focal airspace consolidation, pleural effusion, or pneumothorax. IMPRESSION: Cardiomegaly with mild interstitial prominence which could reflect mild edema. Electronically  Signed   By: Duanne Guess D.O.   On: 02/16/2023 11:04    Cardiac Studies   Echo: 02/17/2023  IMPRESSIONS     1. Left ventricular ejection fraction, by estimation, is 50 to 55%. The  left ventricle has low normal function. The left ventricle has no regional  wall motion abnormalities. The left ventricular internal cavity size was  moderately dilated. There is mild   concentric left ventricular hypertrophy. Left ventricular diastolic  parameters were normal.   2. Right ventricular systolic function is normal. The right ventricular  size is normal.   3. Right atrial size was mildly dilated.   4. The mitral valve is grossly normal. No evidence of mitral valve  regurgitation. No evidence of mitral stenosis.   5. The aortic valve is grossly normal. There is mild calcification of the  aortic valve. There is mild thickening of the aortic valve. Aortic valve  regurgitation is not visualized. Aortic valve sclerosis is present, with  no evidence of aortic valve  stenosis.   6. Aortic dilatation noted. There is borderline dilatation of the  ascending aorta, measuring 38 mm.   Comparison(s): Changes from prior  study are noted. EF slightly less than  prior but no focal wall motion abnormalities.   Conclusion(s)/Recommendation(s): Otherwise normal echocardiogram, with  minor abnormalities described in the report.   FINDINGS   Left Ventricle: Left ventricular ejection fraction, by estimation, is 50  to 55%. The left ventricle has low normal function. The left ventricle has  no regional wall motion abnormalities. Definity contrast agent was given  IV to delineate the left  ventricular endocardial borders. The left ventricular internal cavity size  was moderately dilated. There is mild concentric left ventricular  hypertrophy. Left ventricular diastolic parameters were normal.   Right Ventricle: The right ventricular size is normal. Right vetricular  wall thickness was not well visualized. Right ventricular systolic  function is normal.   Left Atrium: Left atrial size was normal in size.   Right Atrium: Right atrial size was mildly dilated.   Pericardium: There is no evidence of pericardial effusion.   Mitral Valve: The mitral valve is grossly normal. No evidence of mitral  valve regurgitation. No evidence of mitral valve stenosis.   Tricuspid Valve: The tricuspid valve is grossly normal. Tricuspid valve  regurgitation is not demonstrated. No evidence of tricuspid stenosis.   Aortic Valve: The aortic valve is grossly normal. There is mild  calcification of the aortic valve. There is mild thickening of the aortic  valve. Aortic valve regurgitation is not visualized. Aortic valve  sclerosis is present, with no evidence of aortic  valve stenosis. Aortic valve mean gradient measures 3.0 mmHg. Aortic valve  peak gradient measures 5.2 mmHg. Aortic valve area, by VTI measures 3.45  cm.   Pulmonic Valve: The pulmonic valve was not well visualized. Pulmonic valve  regurgitation is not visualized. No evidence of pulmonic stenosis.   Aorta: Aortic dilatation noted. There is borderline dilatation  of the  ascending aorta, measuring 38 mm.   Venous: The inferior vena cava was not well visualized.   IAS/Shunts: The interatrial septum was not well visualized.   Patient Profile     67 y.o. male with PMHx of CAD s/p PCI in 2023, HTN, HLD, liver cirrhosis, type 2 DM who is being seen 02/16/2023 for the evaluation of chest pain.   Assessment & Plan    NSTEMI CAD s/p PCI LAD 9/23 -- presented with intermittent episodes of chest pain/arm  pain for the past week concerning for ACS. hsTn peaked at 57 and EKG with nonspecific changes. Echo with LVEF of 50%, normal RV, no rWMA noted  -- planned for cardiac cath today -- remains on IV heparin, ASA, coreg  Acute on chronic HFpEF -- CXR on admission with edema -- given IV lasix, good UOP looks volume stable on exam. Holding further IV lasix currently   HTN -- blood pressures remain elevated, says they are not well controlled at home -- increase coreg to 25mg  BID, continue lisinopril 40mg , amlodipine 5mg  daily, add spiro 25mg  daily after cath  HLD -- has not been on statins in the past, refuses to take -- LDL 158, HDL 54 -- plan referral to lipid clinic as outpatient   DM -- Hgb A1c 9.4 -- on lantus PTA -- consider addition of SGLT2i   For questions or updates, please contact  [facility] Please consult [email or web address] for contact info under        Signed, Billy Page, NP  02/18/2023, 9:44 AM    Patient seen, examined. Available data reviewed. Agree with findings, assessment, and plan as outlined by Billy Page, NP.  The patient is independently interviewed and examined.  He is alert, oriented, in no distress.  HEENT is normal, lungs are clear, heart is regular rate and rhythm with no murmur gallop, abdomen is soft, obese, nontender, extremities have no edema.  Patient found to have progressive CAD with severe obstructive lesions in the obtuse marginal branch of the circumflex and the proximal and distal RCA.  He is  now status post PCI and has done well.  Needs aggressive risk reduction, really with poor risk factor control at home (hemoglobin A1c 9.4, LDL 158, blood pressure not well-controlled).  Cardiac rehab phase 1, continue counseling with the patient and his wife, anticipate hospital discharge tomorrow.  Patient loaded with prasugrel 60 mg in the Cath Lab.  Billy Davis, M.D. 02/18/2023 5:24 PM

## 2023-02-18 NOTE — Interval H&P Note (Signed)
Cath Lab Visit (complete for each Cath Lab visit)  Clinical Evaluation Leading to the Procedure:   ACS: No.  Non-ACS:    Anginal Classification: CCS III  Anti-ischemic medical therapy: Maximal Therapy (2 or more classes of medications)  Non-Invasive Test Results: No non-invasive testing performed  Prior CABG: No previous CABG      History and Physical Interval Note:  02/18/2023 10:43 AM  Billy Davis  has presented today for surgery, with the diagnosis of unstable angina.  The various methods of treatment have been discussed with the patient and family. After consideration of risks, benefits and other options for treatment, the patient has consented to  Procedure(s): LEFT HEART CATH AND CORONARY ANGIOGRAPHY (N/A) as a surgical intervention.  The patient's history has been reviewed, patient examined, no change in status, stable for surgery.  I have reviewed the patient's chart and labs.  Questions were answered to the patient's satisfaction.     Nicki Guadalajara

## 2023-02-19 ENCOUNTER — Telehealth: Payer: Self-pay | Admitting: Cardiology

## 2023-02-19 ENCOUNTER — Other Ambulatory Visit (HOSPITAL_COMMUNITY): Payer: Self-pay

## 2023-02-19 ENCOUNTER — Encounter (HOSPITAL_COMMUNITY): Payer: Self-pay | Admitting: Cardiovascular Disease

## 2023-02-19 DIAGNOSIS — I214 Non-ST elevation (NSTEMI) myocardial infarction: Secondary | ICD-10-CM | POA: Diagnosis not present

## 2023-02-19 LAB — GLUCOSE, CAPILLARY
Glucose-Capillary: 146 mg/dL — ABNORMAL HIGH (ref 70–99)
Glucose-Capillary: 328 mg/dL — ABNORMAL HIGH (ref 70–99)

## 2023-02-19 LAB — BASIC METABOLIC PANEL
Anion gap: 7 (ref 5–15)
BUN: 15 mg/dL (ref 8–23)
CO2: 23 mmol/L (ref 22–32)
Calcium: 8.6 mg/dL — ABNORMAL LOW (ref 8.9–10.3)
Chloride: 107 mmol/L (ref 98–111)
Creatinine, Ser: 0.82 mg/dL (ref 0.61–1.24)
GFR, Estimated: >60 mL/min (ref >60)
Glucose, Bld: 160 mg/dL — ABNORMAL HIGH (ref 70–99)
Potassium: 3.4 mmol/L — ABNORMAL LOW (ref 3.5–5.1)
Sodium: 137 mmol/L (ref 135–145)

## 2023-02-19 LAB — CBC
HCT: 39.5 % (ref 39.0–52.0)
Hemoglobin: 13.3 g/dL (ref 13.0–17.0)
MCH: 26.9 pg (ref 26.0–34.0)
MCHC: 33.7 g/dL (ref 30.0–36.0)
MCV: 79.8 fL — ABNORMAL LOW (ref 80.0–100.0)
Platelets: 101 10*3/uL — ABNORMAL LOW (ref 150–400)
RBC: 4.95 MIL/uL (ref 4.22–5.81)
RDW: 13.7 % (ref 11.5–15.5)
WBC: 5.2 10*3/uL (ref 4.0–10.5)
nRBC: 0 % (ref 0.0–0.2)

## 2023-02-19 LAB — LIPOPROTEIN A (LPA): Lipoprotein (a): 85.6 nmol/L — ABNORMAL HIGH (ref <75.0)

## 2023-02-19 MED ORDER — SPIRONOLACTONE 25 MG PO TABS: 25.0000 mg | ORAL_TABLET | Freq: Every day | ORAL | 2 refills | Status: AC

## 2023-02-19 MED ORDER — AMLODIPINE BESYLATE 5 MG PO TABS: 5.0000 mg | ORAL_TABLET | Freq: Every day | ORAL | 2 refills | Status: DC

## 2023-02-19 MED ORDER — CARVEDILOL 25 MG PO TABS
25.0000 mg | ORAL_TABLET | Freq: Two times a day (BID) | ORAL | 3 refills | Status: DC
  Filled 2023-02-19: qty 180, 90d supply, fill #0

## 2023-02-19 MED ORDER — PRASUGREL HCL 10 MG PO TABS
10.0000 mg | ORAL_TABLET | Freq: Every day | ORAL | 4 refills | Status: DC
  Filled 2023-02-19: qty 90, 90d supply, fill #0

## 2023-02-19 MED ORDER — PRASUGREL HCL 10 MG PO TABS: 10.0000 mg | ORAL_TABLET | Freq: Every day | ORAL | 3 refills | Status: DC

## 2023-02-19 MED ORDER — SPIRONOLACTONE 25 MG PO TABS
25.0000 mg | ORAL_TABLET | Freq: Every day | ORAL | 3 refills | Status: DC
  Filled 2023-02-19: qty 90, 90d supply, fill #0

## 2023-02-19 MED ORDER — EMPAGLIFLOZIN 10 MG PO TABS: 10.0000 mg | ORAL_TABLET | Freq: Every day | ORAL | 2 refills | Status: DC

## 2023-02-19 MED ORDER — EMPAGLIFLOZIN 10 MG PO TABS
10.0000 mg | ORAL_TABLET | Freq: Every day | ORAL | 2 refills | Status: DC
  Filled 2023-02-19: qty 30, 30d supply, fill #0

## 2023-02-19 MED ORDER — POTASSIUM CHLORIDE CRYS ER 20 MEQ PO TBCR
40.0000 meq | EXTENDED_RELEASE_TABLET | Freq: Once | ORAL | Status: AC
  Administered 2023-02-19: 40 meq via ORAL
  Filled 2023-02-19: qty 2

## 2023-02-19 MED ORDER — CARVEDILOL 25 MG PO TABS: 25.0000 mg | ORAL_TABLET | Freq: Two times a day (BID) | ORAL | 3 refills | Status: AC

## 2023-02-19 MED ORDER — AMLODIPINE BESYLATE 5 MG PO TABS
5.0000 mg | ORAL_TABLET | Freq: Every day | ORAL | 3 refills | Status: DC
  Filled 2023-02-19: qty 90, 90d supply, fill #0

## 2023-02-19 NOTE — Progress Notes (Addendum)
Rounding Note    Patient Name: Billy Davis Date of Encounter: 02/19/2023  [facility] Cardiologist: Dina Rich, MD   Subjective   Sitting up in bed, no complaints. Ready to go home.  Inpatient Medications    Scheduled Meds:  amLODipine  5 mg Oral Daily   aspirin EC  81 mg Oral Daily   carvedilol  25 mg Oral BID WC   insulin aspart  0-5 Units Subcutaneous QHS   insulin aspart  0-9 Units Subcutaneous TID WC   insulin glargine-yfgn  30 Units Subcutaneous QHS   lisinopril  40 mg Oral Daily   pantoprazole  40 mg Oral Daily   prasugrel  10 mg Oral Daily   sodium chloride flush  3 mL Intravenous Q12H   spironolactone  25 mg Oral Daily   Continuous Infusions:  PRN Meds: acetaminophen, nitroGLYCERIN, ondansetron (ZOFRAN) IV, mouth rinse   Vital Signs    Vitals:   02/18/23 1345 02/18/23 1651 02/19/23 0206 02/19/23 0538  BP: 120/73  (!) 141/82 (!) 147/89  Pulse:  64 63 66  Resp:   17 16  Temp:   98 F (36.7 C) 97.6 F (36.4 C)  TempSrc:   Oral Oral  SpO2:      Weight:      Height:        Intake/Output Summary (Last 24 hours) at 02/19/2023 0745 Last data filed at 02/18/2023 1710 Gross per 24 hour  Intake 955.11 ml  Output --  Net 955.11 ml      02/18/2023    5:10 AM 02/16/2023   10:24 AM 02/16/2023   10:16 AM  Last 3 Weights  Weight (lbs) 290 lb 11.2 oz 297 lb 305 lb  Weight (kg) 131.861 kg 134.718 kg 138.347 kg      Telemetry    Sinus Rhythm Hr 60s - Personally Reviewed  ECG    Sinus brady HR 59, unchanged from previous- Personally Reviewed  Physical Exam   GEN: No acute distress.   Neck: No JVD Cardiac: RRR, no murmurs, rubs, or gallops.  Respiratory: Clear to auscultation bilaterally. GI: Soft, nontender, non-distended  Vascular: Rt radial site stable, no bleeding or hematoma MS: +2 LE edema ; No deformity. Neuro:  Nonfocal  Psych: Normal affect   Labs    High Sensitivity Troponin:   Recent Labs  Lab 02/16/23 1042  02/16/23 1225 02/16/23 1430  TROPONINIHS 38* 49* 57*     Chemistry Recent Labs  Lab 02/17/23 0714 02/18/23 0959 02/19/23 0137  NA 140 139 137  K 3.5 3.9 3.4*  CL 101 107 107  CO2 23 23 23   GLUCOSE 184* 172* 160*  BUN 11 13 15   CREATININE 0.72 0.77 0.82  CALCIUM 8.9 8.6* 8.6*  PROT 6.6  --   --   ALBUMIN 3.7  --   --   AST 29  --   --   ALT 35  --   --   ALKPHOS 67  --   --   BILITOT 1.2  --   --   GFRNONAA >60 >60 >60  ANIONGAP 16* 9 7    Lipids  Recent Labs  Lab 02/17/23 0714  CHOL 236*  TRIG 118  HDL 54  LDLCALC 158*  CHOLHDL 4.4    Hematology Recent Labs  Lab 02/17/23 0714 02/18/23 0236 02/19/23 0137  WBC 5.7 5.4 5.2  RBC 5.36 5.19 4.95  HGB 14.5 14.6 13.3  HCT 42.7 42.3 39.5  MCV 79.7*  81.5 79.8*  MCH 27.1 28.1 26.9  MCHC 34.0 34.5 33.7  RDW 13.7 13.6 13.7  PLT 120* 104* 101*   Thyroid  Recent Labs  Lab 02/17/23 0714  TSH 1.287  FREET4 1.04    BNP Recent Labs  Lab 02/17/23 0714  BNP 60.3      Radiology    CARDIAC CATHETERIZATION  Result Date: 02/18/2023 Successful two-vessel PCI with stenting of severe in-stent restenosis in a large first OM branch of the circumflex and successful stenting of severe stenoses in the proximal and distal RCA, all lesions treated with drug-eluting stents Recommendations: DAPT with aspirin and prasugrel for 12 months as tolerated.  Consider long-term P2Y12 inhibition in this gentleman with multiple PCI procedures and extensive CAD.   CARDIAC CATHETERIZATION  Result Date: 02/18/2023   Dist LAD-2 lesion is 65% stenosed.   Prox RCA-2 lesion is 50% stenosed. Ost RCA to Prox RCA lesion is 60% stenosed.   Prox RCA-2 lesion is 50% stenosed.   Dist RCA-1 lesion is 80% stenosed.   Dist RCA-2 lesion is 60% stenosed.   1st Diag lesion is 70% stenosed.   2nd Mrg-1 lesion is 50% stenosed.   Ost RCA to Prox RCA lesion is 60% stenosed.   2nd Mrg-2 lesion is 90% stenosed.   Dist LAD-1 lesion is 75% stenosed.   RPDA-1 lesion  is 50% stenosed.   RPDA-2 lesion is 70% stenosed.   Mid LM lesion is 10% stenosed.   Prox RCA-1 lesion is 70% stenosed.   Prox RCA-2 lesion is 70% stenosed.   Non-stenotic Prox LAD to Mid LAD lesion was previously treated.   Non-stenotic Mid LAD lesion was previously treated.   Non-stenotic Mid RCA lesion was previously treated.   There is mild left ventricular systolic dysfunction.   LV end diastolic pressure is normal.   The left ventricular ejection fraction is 45-50% by visual estimate.   Recommend dual antiplatelet therapy.   PCI is to be performed with aspirin/Effient. Multivessel CAD with vessel CAD with patent LAD stent extending from the ostium to the proximal mid segment.  There is diffuse 60 to 75% mid distal LAD disease. Left circumflex marginal stent with distal 90% stenosis. Diffusely diseased dominant RCA with 70% proximal and proximal to mid stenoses, patent previously placed stent before the acute margin, and progressive 80% stenosis followed by 60% distal RCA stenosis with diffuse irregularity of narrowings of 50 to 70% in the PDA vessel. Mild LV dysfunction with EF estimate 45 to 50% with hypocontractility involving the mid anterolateral wall.  LVEDP 15 mmHg. RECOMMENDATION: Patient will be undergoing two-vessel PCI  with Dr. Excell Seltzer to the circumflex marginal vessel and RCA immediately following this diagnostic catheterization.  DAPT will be administered with aspirin/prasugrel.   ECHOCARDIOGRAM COMPLETE  Result Date: 02/17/2023    ECHOCARDIOGRAM REPORT   Patient Name:   Billy Davis Date of Exam: 02/17/2023 Medical Rec #:  [id number]      Height:       74.0 in Accession #:    [id number]     Weight:       297.0 lb Date of Birth:  06/19/1956      BSA:          2.571 m Patient Age:    67 years       BP:           156/86 mmHg Patient Gender: M              HR:  62 bpm. Exam Location:  Inpatient Procedure: 2D Echo, Cardiac Doppler, Color Doppler and Intracardiac            Opacification  Agent Indications:    NSTEMI  History:        Patient has prior history of Echocardiogram examinations, most                 recent 04/23/2022. CHF, CAD; Risk Factors:Diabetes.  Sonographer:    Darlys Gales Referring Phys: [id number] EZEQUIEL D MUNOZ GONZALEZ IMPRESSIONS  1. Left ventricular ejection fraction, by estimation, is 50 to 55%. The left ventricle has low normal function. The left ventricle has no regional wall motion abnormalities. The left ventricular internal cavity size was moderately dilated. There is mild  concentric left ventricular hypertrophy. Left ventricular diastolic parameters were normal.  2. Right ventricular systolic function is normal. The right ventricular size is normal.  3. Right atrial size was mildly dilated.  4. The mitral valve is grossly normal. No evidence of mitral valve regurgitation. No evidence of mitral stenosis.  5. The aortic valve is grossly normal. There is mild calcification of the aortic valve. There is mild thickening of the aortic valve. Aortic valve regurgitation is not visualized. Aortic valve sclerosis is present, with no evidence of aortic valve stenosis.  6. Aortic dilatation noted. There is borderline dilatation of the ascending aorta, measuring 38 mm. Comparison(s): Changes from prior study are noted. EF slightly less than prior but no focal wall motion abnormalities. Conclusion(s)/Recommendation(s): Otherwise normal echocardiogram, with minor abnormalities described in the report. FINDINGS  Left Ventricle: Left ventricular ejection fraction, by estimation, is 50 to 55%. The left ventricle has low normal function. The left ventricle has no regional wall motion abnormalities. Definity contrast agent was given IV to delineate the left ventricular endocardial borders. The left ventricular internal cavity size was moderately dilated. There is mild concentric left ventricular hypertrophy. Left ventricular diastolic parameters were normal. Right Ventricle: The right  ventricular size is normal. Right vetricular wall thickness was not well visualized. Right ventricular systolic function is normal. Left Atrium: Left atrial size was normal in size. Right Atrium: Right atrial size was mildly dilated. Pericardium: There is no evidence of pericardial effusion. Mitral Valve: The mitral valve is grossly normal. No evidence of mitral valve regurgitation. No evidence of mitral valve stenosis. Tricuspid Valve: The tricuspid valve is grossly normal. Tricuspid valve regurgitation is not demonstrated. No evidence of tricuspid stenosis. Aortic Valve: The aortic valve is grossly normal. There is mild calcification of the aortic valve. There is mild thickening of the aortic valve. Aortic valve regurgitation is not visualized. Aortic valve sclerosis is present, with no evidence of aortic valve stenosis. Aortic valve mean gradient measures 3.0 mmHg. Aortic valve peak gradient measures 5.2 mmHg. Aortic valve area, by VTI measures 3.45 cm. Pulmonic Valve: The pulmonic valve was not well visualized. Pulmonic valve regurgitation is not visualized. No evidence of pulmonic stenosis. Aorta: Aortic dilatation noted. There is borderline dilatation of the ascending aorta, measuring 38 mm. Venous: The inferior vena cava was not well visualized. IAS/Shunts: The interatrial septum was not well visualized.  LEFT VENTRICLE PLAX 2D LVIDd:         6.00 cm   Diastology LVIDs:         4.30 cm   LV e' medial:    4.90 cm/s LV PW:         1.30 cm   LV E/e' medial:  8.6 LV IVS:  1.40 cm   LV e' lateral:   4.03 cm/s LVOT diam:     2.00 cm   LV E/e' lateral: 10.4 LV SV:         70 LV SV Index:   27 LVOT Area:     3.14 cm  RIGHT VENTRICLE RV S prime:     19.10 cm/s TAPSE (M-mode): 2.5 cm LEFT ATRIUM             Index        RIGHT ATRIUM           Index LA Vol (A2C):   62.1 ml 24.15 ml/m  RA Area:     21.80 cm LA Vol (A4C):   49.2 ml 19.13 ml/m  RA Volume:   64.20 ml  24.97 ml/m LA Biplane Vol: 55.7 ml 21.66  ml/m  AORTIC VALVE AV Area (Vmax):    2.73 cm AV Area (Vmean):   3.01 cm AV Area (VTI):     3.45 cm AV Vmax:           114.00 cm/s AV Vmean:          85.200 cm/s AV VTI:            0.203 m AV Peak Grad:      5.2 mmHg AV Mean Grad:      3.0 mmHg LVOT Vmax:         99.00 cm/s LVOT Vmean:        81.600 cm/s LVOT VTI:          0.223 m LVOT/AV VTI ratio: 1.10  AORTA Ao Root diam: 3.80 cm Ao Asc diam:  3.80 cm MITRAL VALVE MV Area (PHT): 2.76 cm    SHUNTS MV Decel Time: 275 msec    Systemic VTI:  0.22 m MV E velocity: 41.90 cm/s  Systemic Diam: 2.00 cm MV A velocity: 71.30 cm/s MV E/A ratio:  0.59 Jodelle Red MD Electronically signed by Jodelle Red MD Signature Date/Time: 02/17/2023/12:34:44 PM    Final     Cardiac Studies   Echo: 02/17/2023   IMPRESSIONS    1. Left ventricular ejection fraction, by estimation, is 50 to 55%. The  left ventricle has low normal function. The left ventricle has no regional  wall motion abnormalities. The left ventricular internal cavity size was  moderately dilated. There is mild   concentric left ventricular hypertrophy. Left ventricular diastolic  parameters were normal.   2. Right ventricular systolic function is normal. The right ventricular  size is normal.   3. Right atrial size was mildly dilated.   4. The mitral valve is grossly normal. No evidence of mitral valve  regurgitation. No evidence of mitral stenosis.   5. The aortic valve is grossly normal. There is mild calcification of the  aortic valve. There is mild thickening of the aortic valve. Aortic valve  regurgitation is not visualized. Aortic valve sclerosis is present, with  no evidence of aortic valve  stenosis.   6. Aortic dilatation noted. There is borderline dilatation of the  ascending aorta, measuring 38 mm.   Comparison(s): Changes from prior study are noted. EF slightly less than  prior but no focal wall motion abnormalities.   Conclusion(s)/Recommendation(s):  Otherwise normal echocardiogram, with  minor abnormalities described in the report.    LHC 02/18/23   Dist LAD-2 lesion is 65% stenosed.   Prox RCA-2 lesion is 50% stenosed. Ost RCA to Prox RCA lesion is 60% stenosed.   Prox  RCA-2 lesion is 50% stenosed.   Dist RCA-1 lesion is 80% stenosed.   Dist RCA-2 lesion is 60% stenosed.   1st Diag lesion is 70% stenosed.   2nd Mrg-1 lesion is 50% stenosed.   Ost RCA to Prox RCA lesion is 60% stenosed.   2nd Mrg-2 lesion is 90% stenosed.   Dist LAD-1 lesion is 75% stenosed.   RPDA-1 lesion is 50% stenosed.   RPDA-2 lesion is 70% stenosed.   Mid LM lesion is 10% stenosed.   Prox RCA-1 lesion is 70% stenosed.   Prox RCA-2 lesion is 70% stenosed.   Non-stenotic Prox LAD to Mid LAD lesion was previously treated.   Non-stenotic Mid LAD lesion was previously treated.   Non-stenotic Mid RCA lesion was previously treated.   There is mild left ventricular systolic dysfunction.   LV end diastolic pressure is normal.   The left ventricular ejection fraction is 45-50% by visual estimate.   Recommend dual antiplatelet therapy.   PCI is to be performed with aspirin/Effient.   Multivessel CAD with vessel CAD with patent LAD stent extending from the ostium to the proximal mid segment.  There is diffuse 60 to 75% mid distal LAD disease.   Left circumflex marginal stent with distal 90% stenosis.   Diffusely diseased dominant RCA with 70% proximal and proximal to mid stenoses, patent previously placed stent before the acute margin, and progressive 80% stenosis followed by 60% distal RCA stenosis with diffuse irregularity of narrowings of 50 to 70% in the PDA vessel.   Mild LV dysfunction with EF estimate 45 to 50% with hypocontractility involving the mid anterolateral wall.  LVEDP 15 mmHg.   RECOMMENDATION: Patient will be undergoing two-vessel PCI  with Dr. Excell Seltzer to the circumflex marginal vessel and RCA immediately following this diagnostic catheterization.   DAPT will be administered with aspirin/prasugrel.  PCI 02/18/24  Successful two-vessel PCI with stenting of severe in-stent restenosis in a large first OM branch of the circumflex and successful stenting of severe stenoses in the proximal and distal RCA, all lesions treated with drug-eluting stents   Recommendations: DAPT with aspirin and prasugrel for 12 months as tolerated.  Consider long-term P2Y12 inhibition in this gentleman with multiple PCI procedures and extensive CAD.  Patient Profile     67 y.o. male with PMHx of CAD s/p PCI in 2023, HTN, HLD, liver cirrhosis, type 2 DM who was seen 02/16/2023 for the evaluation of chest pain.   Assessment & Plan    NSTEMI CAD s/p PCI LAD 9/23 -- presented with intermittent episodes of chest pain/arm pain for the past week concerning for ACS. hsTn peaked at 57 and EKG with nonspecific changes. Echo with LVEF of 50%, normal RV, no rWMA noted  -- Cath yesterday with DES to in stent restenosis in first OM of circ, prox and distal RCA.   -- Continue, ASA, coreg, effient   Acute on chronic HFpEF -- CXR on admission with edema -- initially required lasix -- Volume stable on exam, some LE edema the patient states is much improved -- continue spiro, coreg, lisinopril    HTN -- blood pressures remain elevated, says they are not well controlled at home -- continue coreg to 25mg  BID, continue lisinopril 40mg , amlodipine 5mg  daily, spiro 25mg  daily     HLD -- has not been on statins in the past, refuses to take -- LDL 158, HDL 54 -- plan referral to lipid clinic as outpatient    DM -- Hgb A1c 9.4 --  on lantus PTA -- consider addition of SGLT2i, however likely would be cost prohibitive, pt states he is not able to afford mealtime insulin  -- after discussion with pt, jardiance will be 47 dollars a month. Pt states he will try it.    For questions or updates, please contact [facility] Please consult [email or web address] for contact info  under   Signed, Osborne Oman, RN, Student Nurse Practitioner  02/19/2023, 7:45 AM     Patient seen, examined. Available data reviewed. Agree with findings, assessment, and plan as outlined by Deniece Ree, RN, NP student.  The patient is independently interviewed and examined.  He is alert, oriented, in no distress.  HEENT is normal, neck with normal JVP, lungs are clear to auscultation bilaterally, heart is regular rate and rhythm with no murmur or gallop, abdomen is soft and nontender, obese, extremities have no edema.  Right radial cath site is clear with no hematoma or ecchymosis.  Patient status post two-vessel PCI yesterday.  Appears clinically stable now.  No recurrent angina.  Discussed post PCI instructions with him.  I emphasized the importance of medication adherence especially with DAPT using aspirin and prasugrel.  We discussed the importance of diet, initiation of an exercise program, and weight loss.  Will add Jardiance to his regimen.  Needs lipid clinic referral for PCSK9 inhibitor as he is completely statin intolerant.  Otherwise as outlined above.  Tonny Bollman, M.D. 02/19/2023 9:43 AM

## 2023-02-19 NOTE — Discharge Summary (Signed)
Discharge Summary    Patient ID: Billy Davis MRN: [id number]; DOB: 1956-01-26  Admit date: 02/16/2023 Discharge date: 02/19/2023  PCP:  Kirstie Peri, MD   [facility] Providers Cardiologist:  Dina Rich, MD   {    Discharge Diagnoses    Principal Problem:   Angina pectoris [facility]) Active Problems:   Diastolic dysfunction   Hypertension   Coronary artery disease of native artery of native heart with stable angina pectoris (HCC)   DM (diabetes mellitus) (HCC)   NSTEMI (non-ST elevated myocardial infarction) (HCC)   Diagnostic Studies/Procedures    Echo: 02/17/2023   IMPRESSIONS    1. Left ventricular ejection fraction, by estimation, is 50 to 55%. The  left ventricle has low normal function. The left ventricle has no regional  wall motion abnormalities. The left ventricular internal cavity size was  moderately dilated. There is mild   concentric left ventricular hypertrophy. Left ventricular diastolic  parameters were normal.   2. Right ventricular systolic function is normal. The right ventricular  size is normal.   3. Right atrial size was mildly dilated.   4. The mitral valve is grossly normal. No evidence of mitral valve  regurgitation. No evidence of mitral stenosis.   5. The aortic valve is grossly normal. There is mild calcification of the  aortic valve. There is mild thickening of the aortic valve. Aortic valve  regurgitation is not visualized. Aortic valve sclerosis is present, with  no evidence of aortic valve  stenosis.   6. Aortic dilatation noted. There is borderline dilatation of the  ascending aorta, measuring 38 mm.   Comparison(s): Changes from prior study are noted. EF slightly less than  prior but no focal wall motion abnormalities.   Conclusion(s)/Recommendation(s): Otherwise normal echocardiogram, with  minor abnormalities described in the report.     LHC 02/18/23   Dist LAD-2 lesion is 65% stenosed.   Prox RCA-2 lesion is  50% stenosed. Ost RCA to Prox RCA lesion is 60% stenosed.   Prox RCA-2 lesion is 50% stenosed.   Dist RCA-1 lesion is 80% stenosed.   Dist RCA-2 lesion is 60% stenosed.   1st Diag lesion is 70% stenosed.   2nd Mrg-1 lesion is 50% stenosed.   Ost RCA to Prox RCA lesion is 60% stenosed.   2nd Mrg-2 lesion is 90% stenosed.   Dist LAD-1 lesion is 75% stenosed.   RPDA-1 lesion is 50% stenosed.   RPDA-2 lesion is 70% stenosed.   Mid LM lesion is 10% stenosed.   Prox RCA-1 lesion is 70% stenosed.   Prox RCA-2 lesion is 70% stenosed.   Non-stenotic Prox LAD to Mid LAD lesion was previously treated.   Non-stenotic Mid LAD lesion was previously treated.   Non-stenotic Mid RCA lesion was previously treated.   There is mild left ventricular systolic dysfunction.   LV end diastolic pressure is normal.   The left ventricular ejection fraction is 45-50% by visual estimate.   Recommend dual antiplatelet therapy.   PCI is to be performed with aspirin/Effient.   Multivessel CAD with vessel CAD with patent LAD stent extending from the ostium to the proximal mid segment.  There is diffuse 60 to 75% mid distal LAD disease.   Left circumflex marginal stent with distal 90% stenosis.   Diffusely diseased dominant RCA with 70% proximal and proximal to mid stenoses, patent previously placed stent before the acute margin, and progressive 80% stenosis followed by 60% distal RCA stenosis with diffuse irregularity of  narrowings of 50 to 70% in the PDA vessel.   Mild LV dysfunction with EF estimate 45 to 50% with hypocontractility involving the mid anterolateral wall.  LVEDP 15 mmHg.   RECOMMENDATION: Patient will be undergoing two-vessel PCI  with Dr. Excell Seltzer to the circumflex marginal vessel and RCA immediately following this diagnostic catheterization.  DAPT will be administered with aspirin/prasugrel.   PCI 02/18/24   Successful two-vessel PCI with stenting of severe in-stent restenosis in a large first OM  branch of the circumflex and successful stenting of severe stenoses in the proximal and distal RCA, all lesions treated with drug-eluting stents   Recommendations: DAPT with aspirin and prasugrel for 12 months as tolerated.  Consider long-term P2Y12 inhibition in this gentleman with multiple PCI procedures and extensive CAD. _____________   History of Present Illness     Billy Davis is a 67 y.o. male with PMHx of CAD s/p PCI in 2023, HTN, HLD, liver cirrhosis, and type 2 DM.  He is followed by Dr. Wyline Mood for his cardiology care.   In 2008 he underwent BMS PCI to LCX with noted moderate diffuse disease of LAD and RCA that was treated medically. Cath in 2015 showed patent stent and non-occlusive disease. In 2020 he reported to [facility] with chest pain at rest. Pt declined cath and his antianginals were titrated.   In 03/2022 the pt underwent LHC after abnormal stress test precipitated by chest pain at rest lasting greater than 10 minutes. Cath revealed 60% proximal, 40% mid, and diffuse 60 to 70% distal/apical LAD disease, 70% D1 stenosis, and multifocal 40 to 60% disease in proximal through distal RCA.  PCI with DES placed ostial through mid LAD. Prior stents were patent. He was changed from brilinta to effient at follow up due to side effects.   02/16/23 he presented to the [facility] with complaints of worsening shortness of breath and LE edema. He also had left sided chest pain radiating to his arm which improved with SL nitroglycerin. He had more episodes of chest pain while in the ED. EKG showed no significant ST changes. Chest x-ray showed mild pulmonary congestion. Troponins mildly positive with peak 57.   Cardiology admitted to evaluate chest pain Hospital Course     Consultants: none   NSTEMI CAD s/p PCI LAD 9/23 -- presented with intermittent episodes of chest pain/arm pain for the past week concerning for ACS. hsTn peaked at 57 and EKG with nonspecific changes. Echo with LVEF of 50%,  normal RV, no rWMA noted  -- Cath 02/19/23 with DES to in stent restenosis in first OM of circ, prox and distal RCA.   -- Continue, ASA, coreg, effient   Acute on chronic HFpEF -- CXR on admission with edema -- initially required lasix -- Volume stable on exam, some LE edema the patient stated is much improved -- continue spiro, coreg, lisinopril   HTN -- blood pressures remain elevated, says they are not well controlled at home -- continue coreg 25mg  BID, lisinopril 40mg , amlodipine 5mg  daily, spiro 25mg  daily   -- amlodipine dose dropped to 5mg  from 10mg , it was felt to be contributing to LE edema   HLD -- has not been on statins in the past, refuses to take -- LDL 158, HDL 54 -- plan referral to lipid clinic as outpatient    DM -- Hgb A1c 9.4 -- on lantus PTA -- pt states he has trouble affording his insulin, adding an SGLT2i may be cost prohibitive  --  after discussion with pt, jardiance will be 47 dollars a month. Pt states he will try it -- add jardiance 10mg  daily at discharge   General: Well developed, well nourished, male appearing in no acute distress. Head: Normocephalic, atraumatic.  Neck: Supple without bruits, JVD. Lungs:  Resp regular and unlabored, CTA. Heart: RRR, S1, S2, no S3, S4, or murmur; no rub. Abdomen: Soft, non-tender, non-distended with normoactive bowel sounds. No hepatomegaly. No rebound/guarding. No obvious abdominal masses. Extremities: No clubbing, cyanosis, edema. Distal pedal pulses are 2+ bilaterally. Right cath site stable without bruising or hematoma Neuro: Alert and oriented X 3. Moves all extremities spontaneously. Psych: Normal affect.  Patient was seen by Dr. Excell Seltzer and deemed stable for discharge home. Follow up arranged the office. Medications sent to the [facility] pharmacy. Educated by pharmD   Did the patient have an acute coronary syndrome (MI, NSTEMI, STEMI, etc) this admission?:  Yes                               AHA/ACC Clinical  Performance & Quality Measures: Aspirin prescribed? - Yes ADP Receptor Inhibitor (Plavix/Clopidogrel, Brilinta/Ticagrelor or Effient/Prasugrel) prescribed (includes medically managed patients)? - Yes Beta Blocker prescribed? - Yes High Intensity Statin (Lipitor 40-80mg  or Crestor 20-40mg ) prescribed? - No - Intolerance EF assessed during THIS hospitalization? - Yes For EF <40%, was ACEI/ARB prescribed? - Not Applicable (EF >/= 40%) For EF <40%, Aldosterone Antagonist (Spironolactone or Eplerenone) prescribed? - Not Applicable (EF >/= 40%) Cardiac Rehab Phase II ordered (including medically managed patients)? - Yes       The patient will be scheduled for a TOC follow up appointment in 7-14 days.  A message has been sent to the [facility] and Scheduling Pool at the office where the patient should be seen for follow up.  _____________  Discharge Vitals Blood pressure (!) 147/89, pulse 66, temperature 97.6 F (36.4 C), temperature source Oral, resp. rate 16, height 6\' 2"  (1.88 m), weight 131.9 kg, SpO2 96 %.  Filed Weights   02/16/23 1016 02/16/23 1024 02/18/23 0510  Weight: (!) 138.3 kg 134.7 kg 131.9 kg    Labs & Radiologic Studies    CBC Recent Labs    02/18/23 0236 02/19/23 0137  WBC 5.4 5.2  HGB 14.6 13.3  HCT 42.3 39.5  MCV 81.5 79.8*  PLT 104* 101*   Basic Metabolic Panel Recent Labs    40/98/11 0959 02/19/23 0137  NA 139 137  K 3.9 3.4*  CL 107 107  CO2 23 23  GLUCOSE 172* 160*  BUN 13 15  CREATININE 0.77 0.82  CALCIUM 8.6* 8.6*   Liver Function Tests Recent Labs    02/17/23 0714  AST 29  ALT 35  ALKPHOS 67  BILITOT 1.2  PROT 6.6  ALBUMIN 3.7    High Sensitivity Troponin:   Recent Labs  Lab 02/16/23 1042 02/16/23 1225 02/16/23 1430  TROPONINIHS 38* 49* 57*    Hemoglobin A1C Recent Labs    02/17/23 0714  HGBA1C 9.4*   Fasting Lipid Panel Recent Labs    02/17/23 0714  CHOL 236*  HDL 54  LDLCALC 158*  TRIG 118  CHOLHDL 4.4   Thyroid  Function Tests Recent Labs    02/17/23 0714  TSH 1.287   _____________  CARDIAC CATHETERIZATION  Result Date: 02/18/2023 Successful two-vessel PCI with stenting of severe in-stent restenosis in a large first OM branch of the circumflex and successful  stenting of severe stenoses in the proximal and distal RCA, all lesions treated with drug-eluting stents Recommendations: DAPT with aspirin and prasugrel for 12 months as tolerated.  Consider long-term P2Y12 inhibition in this gentleman with multiple PCI procedures and extensive CAD.   CARDIAC CATHETERIZATION  Result Date: 02/18/2023   Dist LAD-2 lesion is 65% stenosed.   Prox RCA-2 lesion is 50% stenosed. Ost RCA to Prox RCA lesion is 60% stenosed.   Prox RCA-2 lesion is 50% stenosed.   Dist RCA-1 lesion is 80% stenosed.   Dist RCA-2 lesion is 60% stenosed.   1st Diag lesion is 70% stenosed.   2nd Mrg-1 lesion is 50% stenosed.   Ost RCA to Prox RCA lesion is 60% stenosed.   2nd Mrg-2 lesion is 90% stenosed.   Dist LAD-1 lesion is 75% stenosed.   RPDA-1 lesion is 50% stenosed.   RPDA-2 lesion is 70% stenosed.   Mid LM lesion is 10% stenosed.   Prox RCA-1 lesion is 70% stenosed.   Prox RCA-2 lesion is 70% stenosed.   Non-stenotic Prox LAD to Mid LAD lesion was previously treated.   Non-stenotic Mid LAD lesion was previously treated.   Non-stenotic Mid RCA lesion was previously treated.   There is mild left ventricular systolic dysfunction.   LV end diastolic pressure is normal.   The left ventricular ejection fraction is 45-50% by visual estimate.   Recommend dual antiplatelet therapy.   PCI is to be performed with aspirin/Effient. Multivessel CAD with vessel CAD with patent LAD stent extending from the ostium to the proximal mid segment.  There is diffuse 60 to 75% mid distal LAD disease. Left circumflex marginal stent with distal 90% stenosis. Diffusely diseased dominant RCA with 70% proximal and proximal to mid stenoses, patent previously placed stent  before the acute margin, and progressive 80% stenosis followed by 60% distal RCA stenosis with diffuse irregularity of narrowings of 50 to 70% in the PDA vessel. Mild LV dysfunction with EF estimate 45 to 50% with hypocontractility involving the mid anterolateral wall.  LVEDP 15 mmHg. RECOMMENDATION: Patient will be undergoing two-vessel PCI  with Dr. Excell Seltzer to the circumflex marginal vessel and RCA immediately following this diagnostic catheterization.  DAPT will be administered with aspirin/prasugrel.   ECHOCARDIOGRAM COMPLETE  Result Date: 02/17/2023    ECHOCARDIOGRAM REPORT   Patient Name:   Billy Davis Date of Exam: 02/17/2023 Medical Rec #:  [id number]      Height:       74.0 in Accession #:    [id number]     Weight:       297.0 lb Date of Birth:  27-Dec-1955      BSA:          2.571 m Patient Age:    67 years       BP:           156/86 mmHg Patient Gender: M              HR:           62 bpm. Exam Location:  Inpatient Procedure: 2D Echo, Cardiac Doppler, Color Doppler and Intracardiac            Opacification Agent Indications:    NSTEMI  History:        Patient has prior history of Echocardiogram examinations, most                 recent 04/23/2022. CHF, CAD; Risk Factors:Diabetes.  Sonographer:  Darlys Gales Referring Phys: [id number] EZEQUIEL D MUNOZ GONZALEZ IMPRESSIONS  1. Left ventricular ejection fraction, by estimation, is 50 to 55%. The left ventricle has low normal function. The left ventricle has no regional wall motion abnormalities. The left ventricular internal cavity size was moderately dilated. There is mild  concentric left ventricular hypertrophy. Left ventricular diastolic parameters were normal.  2. Right ventricular systolic function is normal. The right ventricular size is normal.  3. Right atrial size was mildly dilated.  4. The mitral valve is grossly normal. No evidence of mitral valve regurgitation. No evidence of mitral stenosis.  5. The aortic valve is grossly normal. There is  mild calcification of the aortic valve. There is mild thickening of the aortic valve. Aortic valve regurgitation is not visualized. Aortic valve sclerosis is present, with no evidence of aortic valve stenosis.  6. Aortic dilatation noted. There is borderline dilatation of the ascending aorta, measuring 38 mm. Comparison(s): Changes from prior study are noted. EF slightly less than prior but no focal wall motion abnormalities. Conclusion(s)/Recommendation(s): Otherwise normal echocardiogram, with minor abnormalities described in the report. FINDINGS  Left Ventricle: Left ventricular ejection fraction, by estimation, is 50 to 55%. The left ventricle has low normal function. The left ventricle has no regional wall motion abnormalities. Definity contrast agent was given IV to delineate the left ventricular endocardial borders. The left ventricular internal cavity size was moderately dilated. There is mild concentric left ventricular hypertrophy. Left ventricular diastolic parameters were normal. Right Ventricle: The right ventricular size is normal. Right vetricular wall thickness was not well visualized. Right ventricular systolic function is normal. Left Atrium: Left atrial size was normal in size. Right Atrium: Right atrial size was mildly dilated. Pericardium: There is no evidence of pericardial effusion. Mitral Valve: The mitral valve is grossly normal. No evidence of mitral valve regurgitation. No evidence of mitral valve stenosis. Tricuspid Valve: The tricuspid valve is grossly normal. Tricuspid valve regurgitation is not demonstrated. No evidence of tricuspid stenosis. Aortic Valve: The aortic valve is grossly normal. There is mild calcification of the aortic valve. There is mild thickening of the aortic valve. Aortic valve regurgitation is not visualized. Aortic valve sclerosis is present, with no evidence of aortic valve stenosis. Aortic valve mean gradient measures 3.0 mmHg. Aortic valve peak gradient  measures 5.2 mmHg. Aortic valve area, by VTI measures 3.45 cm. Pulmonic Valve: The pulmonic valve was not well visualized. Pulmonic valve regurgitation is not visualized. No evidence of pulmonic stenosis. Aorta: Aortic dilatation noted. There is borderline dilatation of the ascending aorta, measuring 38 mm. Venous: The inferior vena cava was not well visualized. IAS/Shunts: The interatrial septum was not well visualized.  LEFT VENTRICLE PLAX 2D LVIDd:         6.00 cm   Diastology LVIDs:         4.30 cm   LV e' medial:    4.90 cm/s LV PW:         1.30 cm   LV E/e' medial:  8.6 LV IVS:        1.40 cm   LV e' lateral:   4.03 cm/s LVOT diam:     2.00 cm   LV E/e' lateral: 10.4 LV SV:         70 LV SV Index:   27 LVOT Area:     3.14 cm  RIGHT VENTRICLE RV S prime:     19.10 cm/s TAPSE (M-mode): 2.5 cm LEFT ATRIUM  Index        RIGHT ATRIUM           Index LA Vol (A2C):   62.1 ml 24.15 ml/m  RA Area:     21.80 cm LA Vol (A4C):   49.2 ml 19.13 ml/m  RA Volume:   64.20 ml  24.97 ml/m LA Biplane Vol: 55.7 ml 21.66 ml/m  AORTIC VALVE AV Area (Vmax):    2.73 cm AV Area (Vmean):   3.01 cm AV Area (VTI):     3.45 cm AV Vmax:           114.00 cm/s AV Vmean:          85.200 cm/s AV VTI:            0.203 m AV Peak Grad:      5.2 mmHg AV Mean Grad:      3.0 mmHg LVOT Vmax:         99.00 cm/s LVOT Vmean:        81.600 cm/s LVOT VTI:          0.223 m LVOT/AV VTI ratio: 1.10  AORTA Ao Root diam: 3.80 cm Ao Asc diam:  3.80 cm MITRAL VALVE MV Area (PHT): 2.76 cm    SHUNTS MV Decel Time: 275 msec    Systemic VTI:  0.22 m MV E velocity: 41.90 cm/s  Systemic Diam: 2.00 cm MV A velocity: 71.30 cm/s MV E/A ratio:  0.59 Jodelle Red MD Electronically signed by Jodelle Red MD Signature Date/Time: 02/17/2023/12:34:44 PM    Final    DG Chest 2 View  Result Date: 02/16/2023 CLINICAL DATA:  Chest pain, shortness of breath EXAM: CHEST - 2 VIEW COMPARISON:  09/17/2021 FINDINGS: Stable cardiomegaly. Mildly  prominent interstitial markings bilaterally. No focal airspace consolidation, pleural effusion, or pneumothorax. IMPRESSION: Cardiomegaly with mild interstitial prominence which could reflect mild edema. Electronically Signed   By: Duanne Guess D.O.   On: 02/16/2023 11:04   Disposition   Pt is being discharged home today in good condition.  Follow-up Plans & Appointments     Follow-up Information     Sharlene Dory, NP Follow up on 03/07/2023.   Specialty: Cardiology Why: at 8:30am for your follow up appt with Dr. Princess Perna' NP Idelle Jo information: [address] [place] [address] [phone]                Discharge Instructions     AMB Referral to Advanced Lipid Disorders Clinic   Complete by: As directed    Internal Lipid Clinic Referral Scheduling  Internal lipid clinic referrals are providers within [facility], who wish to refer established patients for routine management (help in starting PCSK9 inhibitor therapy) or advanced therapies.  Internal MD referral criteria:              1. All patients with LDL>190 mg/dL  2. All patients with Triglycerides >500 mg/dL  3. Patients with suspected or confirmed heterozygous familial hyperlipidemia (HeFH) or homozygous familial hyperlipidemia (HoFH)  4. Patients with family history of suspicious for genetic dyslipidemia desiring genetic testing  5. Patients refractory to standard guideline based therapy  6. Patients with statin intolerance (failed 2 statins, one of which must be a high potency statin)  7. Patients who the provider desires to be seen by MD   Internal PharmD referral criteria:   1. Follow-up patients for medication management  2. Follow-up for compliance monitoring  3. Patients for drug education  4.  Patients with statin intolerance  5. PCSK9 inhibitor education and prior authorization approvals  6. Patients with triglycerides <500 mg/dL  External Lipid Clinic Referral  External lipid  clinic referrals are for providers outside of [facility], considered new clinic patients - automatically routed to MD schedule   Amb Referral to Cardiac Rehabilitation   Complete by: As directed    Diagnosis:  Coronary Stents NSTEMI     After initial evaluation and assessments completed: Virtual Based Care may be provided alone or in conjunction with Phase 2 Cardiac Rehab based on patient barriers.: Yes   Intensive Cardiac Rehabilitation (ICR) [facility] location only OR Traditional Cardiac Rehabilitation (TCR) *If criteria for ICR are not met will enroll in TCR [facility] only): Yes   Call MD for:  difficulty breathing, headache or visual disturbances   Complete by: As directed    Call MD for:  persistant dizziness or light-headedness   Complete by: As directed    Call MD for:  redness, tenderness, or signs of infection (pain, swelling, redness, odor or green/yellow discharge around incision site)   Complete by: As directed    Diet - low sodium heart healthy   Complete by: As directed    Discharge instructions   Complete by: As directed    Radial Site Care Refer to this sheet in the next few weeks. These instructions provide you with information on caring for yourself after your procedure. Your caregiver may also give you more specific instructions. Your treatment has been planned according to current medical practices, but problems sometimes occur. Call your caregiver if you have any problems or questions after your procedure. HOME CARE INSTRUCTIONS You may shower the day after the procedure. Remove the bandage (dressing) and gently wash the site with plain soap and water. Gently pat the site dry.  Do not apply powder or lotion to the site.  Do not submerge the affected site in water for 3 to 5 days.  Inspect the site at least twice daily.  Do not flex or bend the affected arm for 24 hours.  No lifting over 5 pounds (2.3 kg) for 5 days after your procedure.  Do not drive home if you are  discharged the same day of the procedure. Have someone else drive you.  You may drive 24 hours after the procedure unless otherwise instructed by your caregiver.  What to expect: Any bruising will usually fade within 1 to 2 weeks.  Blood that collects in the tissue (hematoma) may be painful to the touch. It should usually decrease in size and tenderness within 1 to 2 weeks.  SEEK IMMEDIATE MEDICAL CARE IF: You have unusual pain at the radial site.  You have redness, warmth, swelling, or pain at the radial site.  You have drainage (other than a small amount of blood on the dressing).  You have chills.  You have a fever or persistent symptoms for more than 72 hours.  You have a fever and your symptoms suddenly get worse.  Your arm becomes pale, cool, tingly, or numb.  You have heavy bleeding from the site. Hold pressure on the site.   PLEASE DO NOT MISS ANY DOSES OF YOUR EFFIENT!!!!! Also keep a log of you blood pressures and bring back to your follow up appt. Please call the office with any questions.   Patients taking blood thinners should generally stay away from medicines like ibuprofen, Advil, Motrin, naproxen, and Aleve due to risk of stomach bleeding. You may take Tylenol as  directed or talk to your primary doctor about alternatives.   PLEASE ENSURE THAT YOU DO NOT RUN OUT OF YOUR EFFIENT. This medication is very important to remain on for at least one year. IF you have issues obtaining this medication due to cost please CALL the office 3-5 business days prior to running out in order to prevent missing doses of this medication.   Increase activity slowly   Complete by: As directed         Discharge Medications   Allergies as of 02/19/2023       Reactions   Apresoline [hydralazine] Shortness Of Breath   Codeine Palpitations   Plavix [clopidogrel Bisulfate] Rash        Medication List     STOP taking these medications    furosemide 20 MG tablet Commonly known as:  LASIX       TAKE these medications    amLODipine 5 MG tablet Commonly known as: NORVASC Take 1 tablet (5 mg total) by mouth daily. Start taking on: February 20, 2023 What changed:  medication strength how much to take   ascorbic acid 500 MG tablet Commonly known as: VITAMIN C Take 500 mg by mouth daily.   aspirin EC 81 MG tablet Take 1 tablet (81 mg total) by mouth daily. What changed: Another medication with the same name was removed. Continue taking this medication, and follow the directions you see here.   carvedilol 25 MG tablet Commonly known as: COREG Take 1 tablet (25 mg total) by mouth 2 (two) times daily with a meal. What changed:  medication strength how much to take when to take this   empagliflozin 10 MG Tabs tablet Commonly known as: Jardiance Take 1 tablet (10 mg total) by mouth daily.   insulin glargine 100 UNIT/ML Solostar Pen Commonly known as: LANTUS Inject 40-50 Units into the skin See admin instructions. 40 units in the morning, 50 units at bedtime.   lisinopril 40 MG tablet Commonly known as: ZESTRIL Take 40 mg by mouth every morning.   MULTI VITAMIN MENS PO Take 1 tablet by mouth daily.   nitroGLYCERIN 0.4 MG SL tablet Commonly known as: NITROSTAT Place 1 tablet (0.4 mg total) under the tongue every 5 (five) minutes x 3 doses as needed for chest pain (if no relief after 2nd dose, proceed to ED or call 911). For chest pains   pantoprazole 40 MG tablet Commonly known as: PROTONIX Take 40 mg by mouth daily.   prasugrel 10 MG Tabs tablet Commonly known as: EFFIENT Take 1 tablet (10 mg total) by mouth daily.   spironolactone 25 MG tablet Commonly known as: ALDACTONE Take 1 tablet (25 mg total) by mouth daily. Start taking on: February 20, 2023   zinc gluconate 50 MG tablet Take 50 mg by mouth daily.        Outstanding Labs/Studies   BMET at follow up   Duration of Discharge Encounter   Greater than 30 minutes including physician  time.  Signed, Osborne Oman, RN, Student Nurse Practitioner 02/19/2023, 12:34 PM  I have seen and examined the patient in addition to Deniece Ree, NP student and agree with findings. Adjustments made as needed.   Janice Coffin, NP-C 02/19/2023, 12:34 PM Pager: [phone]

## 2023-02-19 NOTE — Progress Notes (Signed)
CARDIAC REHAB PHASE I   PRE:  Rate/Rhythm: 66 SR  BP:  Sitting: 147/89    MODE:  Ambulation: 240 ft   POST:  Rate/Rhythm: 72 SR  BP:  Sitting: 157/88   Pt ambulated independently in hall, tolerated well with no CP, SOB or dizziness. Returned to bed with call bell and bedside table in reach. Post MI/stent education including site care, restrictions, risk factors, exercise guidelines, NTG use, heart healthy diabetic diet, antiplatelet therapy importance,MI booklet and CRP2 reviewed. All questions and concerns addressed. Will refer to [facility] for CRP2. Plan for home today.   [id number]      Woodroe Chen, RN BSN 02/19/2023 12:09 PM

## 2023-02-19 NOTE — Telephone Encounter (Signed)
   Transition of Care Follow-up Phone Call Request    Patient Name: Billy Davis Date of Birth: 01/26/1956 Date of Encounter: 02/19/2023  Primary Care Provider:  Kirstie Peri, MD Primary Cardiologist:  Dina Rich, MD  Billy Davis has been scheduled for a transition of care follow up appointment with a [facility] provider:  Sharlene Dory 7/11  Please reach out to Billy Davis within 48 hours to confirm appointment and review transition of care protocol questionnaire.  Billy Page, NP  02/19/2023, 9:23 AM

## 2023-02-21 ENCOUNTER — Telehealth (HOSPITAL_COMMUNITY): Payer: Self-pay

## 2023-02-21 NOTE — Telephone Encounter (Signed)
Per phase 1 cardiac rehab fax referral to [facility]

## 2023-02-26 ENCOUNTER — Ambulatory Visit: Payer: Medicare Other

## 2023-03-07 ENCOUNTER — Ambulatory Visit: Payer: Medicare Other | Attending: Nurse Practitioner | Admitting: Nurse Practitioner

## 2023-03-07 ENCOUNTER — Encounter: Payer: Self-pay | Admitting: Nurse Practitioner

## 2023-03-07 VITALS — BP 138/72 | HR 61 | Ht 74.0 in | Wt 300.4 lb

## 2023-03-07 DIAGNOSIS — I5032 Chronic diastolic (congestive) heart failure: Secondary | ICD-10-CM | POA: Diagnosis not present

## 2023-03-07 DIAGNOSIS — I252 Old myocardial infarction: Secondary | ICD-10-CM

## 2023-03-07 DIAGNOSIS — I25119 Atherosclerotic heart disease of native coronary artery with unspecified angina pectoris: Secondary | ICD-10-CM | POA: Diagnosis not present

## 2023-03-07 DIAGNOSIS — R0602 Shortness of breath: Secondary | ICD-10-CM

## 2023-03-07 DIAGNOSIS — E669 Obesity, unspecified: Secondary | ICD-10-CM

## 2023-03-07 DIAGNOSIS — I10 Essential (primary) hypertension: Secondary | ICD-10-CM

## 2023-03-07 DIAGNOSIS — E785 Hyperlipidemia, unspecified: Secondary | ICD-10-CM

## 2023-03-07 MED ORDER — ISOSORBIDE MONONITRATE ER 30 MG PO TB24: 15.0000 mg | ORAL_TABLET | Freq: Every day | ORAL | 2 refills | Status: DC

## 2023-03-07 NOTE — Progress Notes (Addendum)
Cardiology Office Note:  .   Date:  03/07/2023  ID:  [name], DOB October 20, 1955, MRN [id number] PCP: Kirstie Peri, MD  [facility] Providers Cardiologist:  Dina Rich, MD    History of Present Illness: .   Billy Davis is a 67 y.o. male with a PMH of CAD, hx of stenting, HFpEF, HTN, T2DM, HLD, GERD, non-alcoholic cirrhosis, hx of prostate cancer, and obesity, who presents today for hospital follow-up.  Hx of prior bare-metal stent to left circumflex in 2008 with noted moderate diffuse disease of LAD and RCA that was treated medically. Lexiscan in 2014 showed multiple defects and normal wall motion although was thought to be mostly artifact, EF at that time was 50%.  Underwent left heart catheterization in April 2015 that showed patent LM, proximal LAD was 30%, patent left circumflex, OM1 with patent stents, proximal RCA 30% disease, EF 55 to 65%. Underwent LHC 04/2022 - report noted below.   Most recently, admitted 01/2023 for angina pectoris. Underwent LHC with DES to in stent restenosis in first OM of circ, prox, and distal RCA. Hospital course complicated by acute on chronic HFpEF. EF 50%.   Today he presents for follow-up. He states he continues to note angina pectoris, not as severe as what sent him to the hospital. Described as mid chest, 2-3, nonradiating, occurring at rest and also experiencing shortness of breath at rest while watching TV. Says symptoms are improved since d/c. Denies any palpitations, syncope, presyncope, dizziness, orthopnea, PND, significant weight changes, acute bleeding, or claudication. BLE swelling is stable.   Studies Reviewed: Marland Kitchen   EKG Interpretation Date/Time:  Thursday March 07 2023 08:17:55 EDT Ventricular Rate:  55 PR Interval:  170 QRS Duration:  98 QT Interval:  438 QTC Calculation: 419 R Axis:   37  Text Interpretation: Sinus bradycardia When compared with ECG of 18-Feb-2023 14:05, Nonspecific T wave abnormality no longer evident in  Lateral leads Confirmed by Sharlene Dory [phone]) on 03/07/2023 8:41:14 AM    Coronary stent intervention 02/18/2023:  Successful two-vessel PCI with stenting of severe in-stent restenosis in a large first OM branch of the circumflex and successful stenting of severe stenoses in the proximal and distal RCA, all lesions treated with drug-eluting stents   Recommendations: DAPT with aspirin and prasugrel for 12 months as tolerated.  Consider long-term P2Y12 inhibition in this gentleman with multiple PCI procedures and extensive CAD.  LHC 02/18/2023:    Dist LAD-2 lesion is 65% stenosed.   Prox RCA-2 lesion is 50% stenosed. Ost RCA to Prox RCA lesion is 60% stenosed.   Prox RCA-2 lesion is 50% stenosed.   Dist RCA-1 lesion is 80% stenosed.   Dist RCA-2 lesion is 60% stenosed.   1st Diag lesion is 70% stenosed.   2nd Mrg-1 lesion is 50% stenosed.   Ost RCA to Prox RCA lesion is 60% stenosed.   2nd Mrg-2 lesion is 90% stenosed.   Dist LAD-1 lesion is 75% stenosed.   RPDA-1 lesion is 50% stenosed.   RPDA-2 lesion is 70% stenosed.   Mid LM lesion is 10% stenosed.   Prox RCA-1 lesion is 70% stenosed.   Prox RCA-2 lesion is 70% stenosed.   Non-stenotic Prox LAD to Mid LAD lesion was previously treated.   Non-stenotic Mid LAD lesion was previously treated.   Non-stenotic Mid RCA lesion was previously treated.   There is mild left ventricular systolic dysfunction.   LV end diastolic pressure is normal.   The left  ventricular ejection fraction is 45-50% by visual estimate.   Recommend dual antiplatelet therapy.   PCI is to be performed with aspirin/Effient.   Multivessel CAD with vessel CAD with patent LAD stent extending from the ostium to the proximal mid segment.  There is diffuse 60 to 75% mid distal LAD disease.   Left circumflex marginal stent with distal 90% stenosis.   Diffusely diseased dominant RCA with 70% proximal and proximal to mid stenoses, patent previously placed stent before the  acute margin, and progressive 80% stenosis followed by 60% distal RCA stenosis with diffuse irregularity of narrowings of 50 to 70% in the PDA vessel.   Mild LV dysfunction with EF estimate 45 to 50% with hypocontractility involving the mid anterolateral wall.  LVEDP 15 mmHg.   RECOMMENDATION: Patient will be undergoing two-vessel PCI  with Dr. Excell Seltzer to the circumflex marginal vessel and RCA immediately following this diagnostic catheterization.  DAPT will be administered with aspirin/prasugrel.   Echo 02/17/2023:  1. Left ventricular ejection fraction, by estimation, is 50 to 55%. The  left ventricle has low normal function. The left ventricle has no regional  wall motion abnormalities. The left ventricular internal cavity size was  moderately dilated. There is mild   concentric left ventricular hypertrophy. Left ventricular diastolic  parameters were normal.   2. Right ventricular systolic function is normal. The right ventricular  size is normal.   3. Right atrial size was mildly dilated.   4. The mitral valve is grossly normal. No evidence of mitral valve  regurgitation. No evidence of mitral stenosis.   5. The aortic valve is grossly normal. There is mild calcification of the  aortic valve. There is mild thickening of the aortic valve. Aortic valve  regurgitation is not visualized. Aortic valve sclerosis is present, with  no evidence of aortic valve  stenosis.   6. Aortic dilatation noted. There is borderline dilatation of the  ascending aorta, measuring 38 mm.   Comparison(s): Changes from prior study are noted. EF slightly less than  prior but no focal wall motion abnormalities.   Conclusion(s)/Recommendation(s): Otherwise normal echocardiogram, with  minor abnormalities described in the report.  LHC 04/2022:  Conclusions: Multivessel coronary artery disease with 60% proximal, 40% mid, and diffuse 60-70% distal/apical LAD disease, 70% D1 stenosis, and multifocal 40-60%  disease in the proximal through distal RCA.  Proximal/mid LAD is highly significant by hemodynamic assessment (RFR 0.79).  Proximal/mid RCA disease is not significant (RFR 0.94). Patent OM2 stent with up to 30% in-stent restenosis. Widely patent mid RCA stent. Normal left ventricular filling pressure (LVEDP 10 mmHg). Successful RFR- and OCT-guided PCI to ostial through mid LAD using Synergy 3.0 x 38 mm drug-eluting stent (postdilated up to 3.6 mm) with 0% residual stenosis and TIMI-3 flow.   Recommendations: Dual antiplatelet therapy with aspirin and ticagrelor for at least 6 months, given patient's history of clopidogrel allergy. Aggressive secondary prevention of coronary artery disease.  Favor medical therapy of distal/apical LAD disease given distal location and small vessel size. Anticipate same-day discharge if no complications occurred during 6 hours of post PCI monitoring.     Physical Exam:   VS:  BP 138/72   Pulse 61   Ht 6\' 2"  (1.88 m)   Wt (!) 300 lb 6.4 oz (136.3 kg)   SpO2 97%   BMI 38.57 kg/m    Wt Readings from Last 3 Encounters:  03/07/23 (!) 300 lb 6.4 oz (136.3 kg)  02/18/23 290 lb 11.2 oz (131.9  kg)  01/30/23 (!) 305 lb 12.8 oz (138.7 kg)    GEN: Obese, 67 y.o. male in no acute distress NECK: No JVD; No carotid bruits CARDIAC: S1/S2, RRR, no murmurs, rubs, gallops RESPIRATORY:  Clear to auscultation without rales, wheezing or rhonchi  ABDOMEN: Soft, non-tender, non-distended EXTREMITIES:  1-2+ edema to BLE; No deformity   ASSESSMENT AND PLAN: .    Multivessel CAD, hx of remote stenting, s/p NSTEMI (01/2023) Most recent hx of LHC 01/2023 with DES to in-stent restenosis to first OM of circ, prox, and distal RCA. No complications post cath. Does admit to episodes of angina, not as severe as previously, difficult for him to describe. Continue Aspirin, Coreg, Linsionpril, Effient, and will initiate Imdur 15 mg daily and continue NTG PRN. Heart healthy diet and regular  cardiovascular exercise encouraged. ED precautions discussed. Okay to start cardiac rehab.   2. HFpEF, Shortness of breath Stage C, NYHA class III-IV reported symptoms. Not in acute distress on exam. Weight appears to be stable. EF 50-55%. Was treated for volume overload during most recent admission. Will obtain CBC, BMET, and proBNP. If kidney function permits, will consider starting low dose Lasix PRN. Continue Coreg, Jardiance, Aldactone, and will start Imdur 15 mg daily today. Low sodium diet, fluid restriction <2L, and daily weights encouraged. Educated to contact our office for weight gain of 2 lbs overnight or 5 lbs in one week. Heart healthy diet and regular cardiovascular exercise encouraged. ED precautions discussed.   3. HTN BP mildly elevated today. SBP goal < 130. Discussed to monitor BP at home at least 2 hours after medications and sitting for 5-10 minutes. Continue current medication regimen. Starting Imdur 15 mg daily as mentioned above. Heart healthy diet and regular cardiovascular exercise encouraged. Obtaining labs as mentioned above.   4. HLD LDL not at goal. Previously referred to Lipid Clinic. Discussed Leqvio with patient, would benefit from this medication d/t his PMH. Follow-up with Lipid Clinic as scheduled. Heart healthy diet and regular cardiovascular exercise encouraged.   5. Obesity Weight loss via diet and exercise encouraged. Discussed the impact being overweight would have on cardiovascular risk.     Dispo: Follow-up with me or APP in 6 weeks via telephone visit or sooner if anything changes.   Signed, Sharlene Dory, NP

## 2023-03-07 NOTE — Patient Instructions (Addendum)
Medication Instructions:  Your physician has recommended you make the following change in your medication:  Start taking IMDUR 15Mg  daily- send to [facility] in [place]  Continue all other medications as prescribed   Labwork: CBC,BMET,BNP in 2-3 days at [facility]   Testing/Procedures: None   Follow-Up: Your physician recommends that you schedule a follow-up appointment in: 6 weeks VIA telephone with Philis Nettle   Any Other Special Instructions Will Be Listed Below (If Applicable).  If you need a refill on your cardiac medications before your next appointment, please call your pharmacy.

## 2023-04-01 ENCOUNTER — Ambulatory Visit: Payer: Medicare Other | Attending: Cardiovascular Disease

## 2023-04-01 NOTE — Progress Notes (Deleted)
Patient ID: Billy Davis                 DOB: December 07, 1955                    MRN: [id number]      HPI: Billy Davis is a 67 y.o. male patient referred to lipid clinic by Dr.Branch. PMH is significant for  CAD, hx of stenting, HFpEF, HTN, T2DM, HLD, GERD, non-alcoholic cirrhosis, hx of prostate cancer, and obesity. Most recently, admitted 01/2023 for angina pectoris. Underwent LHC with DES to in stent restenosis in first OM of circ, prox, and distal RCA. Hospital course complicated by acute on chronic HFpEF. EF 50%. Patient saw Billy Davis on March 07 2023 Imdur 15 mg was added to Coreg, Jardiance and Aldactone     Reviewed options for lowering LDL cholesterol, including ezetimibe, PCSK-9 inhibitors, bempedoic acid and inclisiran.  Discussed mechanisms of action, dosing, side effects and potential decreases in LDL cholesterol.  Also reviewed cost information and potential options for patient assistance.  Current Medications: none  Intolerances: none  Risk Factors: Multivessel coronary artery disease, T2DM, hx of NSTEMI, HTN, Obesity LDL goal: <55 mg/dl  Last lab: TC 630, TG 160, HDL 54, LDLc 158  Diet:   Exercise:   Family History:   Social History:   Labs: Lipid Panel     Component Value Date/Time   CHOL 236 (H) 02/17/2023 0714   CHOL 225 (H) 06/01/2022 0902   TRIG 118 02/17/2023 0714   HDL 54 02/17/2023 0714   HDL 58 06/01/2022 0902   CHOLHDL 4.4 02/17/2023 0714   VLDL 24 02/17/2023 0714   LDLCALC 158 (H) 02/17/2023 0714   LDLCALC 149 (H) 06/01/2022 0902   LABVLDL 18 06/01/2022 0902    Past Medical History:  Diagnosis Date   Aneurysm (HCC)    right femoral pseudoaneurysm, thrombosed (following 03/20/07 cardiac cath)   CAD, multiple vessel    PCI circumflex 2008, diffuse disease of the right and LAD treated medically.,  //   Nuclear 2010 no ischemia   //   nuclear May, 2012 no ischemia   CHF (congestive heart failure) (HCC)    Cirrhosis of liver without mention of  alcohol    Contrast media allergy    pt states he is not allergy to contrast   Diabetes mellitus without complication (HCC)    type 2   Diverticulosis    Dysphagia    Ejection fraction    EF 55%, nuclear, May, 2012   Esophageal reflux    IBS (irritable bowel syndrome)    Lower abdominal pain    Mixed dyslipidemia    Morbid obesity (HCC)    Other chronic nonalcoholic liver disease    Postsurgical percutaneous transluminal coronary angioplasty status    Preop cardiovascular exam    Cardiovascular clearance for prostate surgery October, 2013   Prostate cancer (HCC) 03/24/2012   Renal insufficiency    Unspecified diastolic heart failure    Unspecified essential hypertension     Current Outpatient Medications on File Prior to Visit  Medication Sig Dispense Refill   amLODipine (NORVASC) 5 MG tablet Take 1 tablet (5 mg total) by mouth daily. 90 tablet 2   ascorbic acid (VITAMIN C) 500 MG tablet Take 500 mg by mouth daily.     aspirin EC 81 MG tablet Take 1 tablet (81 mg total) by mouth daily.     carvedilol (COREG) 25 MG tablet Take 1 tablet (  25 mg total) by mouth 2 (two) times daily with a meal. 180 tablet 3   empagliflozin (JARDIANCE) 10 MG TABS tablet Take 1 tablet (10 mg total) by mouth daily. 30 tablet 2   insulin glargine (LANTUS) 100 UNIT/ML Solostar Pen Inject 40-50 Units into the skin See admin instructions. 40 units in the morning, 50 units at bedtime.     isosorbide mononitrate (IMDUR) 30 MG 24 hr tablet Take 0.5 tablets (15 mg total) by mouth daily. 90 tablet 2   lisinopril (PRINIVIL,ZESTRIL) 40 MG tablet Take 40 mg by mouth every morning.      Multiple Vitamin (MULTI VITAMIN MENS PO) Take 1 tablet by mouth daily.      nitroGLYCERIN (NITROSTAT) 0.4 MG SL tablet Place 1 tablet (0.4 mg total) under the tongue every 5 (five) minutes x 3 doses as needed for chest pain (if no relief after 2nd dose, proceed to ED or call 911). For chest pains 25 tablet 3   pantoprazole (PROTONIX)  40 MG tablet Take 40 mg by mouth daily.     prasugrel (EFFIENT) 10 MG TABS tablet Take 1 tablet (10 mg total) by mouth daily. 90 tablet 3   spironolactone (ALDACTONE) 25 MG tablet Take 1 tablet (25 mg total) by mouth daily. 90 tablet 2   zinc gluconate 50 MG tablet Take 50 mg by mouth daily.     No current facility-administered medications on file prior to visit.    Allergies  Allergen Reactions   Apresoline [Hydralazine] Shortness Of Breath   Codeine Palpitations   Plavix [Clopidogrel Bisulfate] Rash    Assessment/Plan:  1. Hyperlipidemia -  No problems updated. No problem-specific Assessment & Plan notes found for this encounter.    Thank you,  Carmela Hurt, Pharm.D [facility] of [facility] [address], [place], [place] [address]  Phone: [phone]; Fax: [phone]

## 2023-04-11 ENCOUNTER — Encounter (INDEPENDENT_AMBULATORY_CARE_PROVIDER_SITE_OTHER): Payer: Self-pay | Admitting: Gastroenterology

## 2023-04-15 ENCOUNTER — Encounter (INDEPENDENT_AMBULATORY_CARE_PROVIDER_SITE_OTHER): Payer: Self-pay | Admitting: *Deleted

## 2023-05-03 ENCOUNTER — Ambulatory Visit: Payer: Medicare Other | Attending: Nurse Practitioner | Admitting: Nurse Practitioner

## 2023-05-03 ENCOUNTER — Encounter: Payer: Self-pay | Admitting: Nurse Practitioner

## 2023-05-03 ENCOUNTER — Telehealth: Payer: Self-pay

## 2023-05-03 VITALS — BP 157/72 | HR 67 | Ht 74.0 in | Wt 300.0 lb

## 2023-05-03 DIAGNOSIS — I10 Essential (primary) hypertension: Secondary | ICD-10-CM

## 2023-05-03 DIAGNOSIS — I5033 Acute on chronic diastolic (congestive) heart failure: Secondary | ICD-10-CM | POA: Diagnosis not present

## 2023-05-03 DIAGNOSIS — R0602 Shortness of breath: Secondary | ICD-10-CM | POA: Diagnosis not present

## 2023-05-03 DIAGNOSIS — R600 Localized edema: Secondary | ICD-10-CM

## 2023-05-03 DIAGNOSIS — I110 Hypertensive heart disease with heart failure: Secondary | ICD-10-CM

## 2023-05-03 DIAGNOSIS — E785 Hyperlipidemia, unspecified: Secondary | ICD-10-CM

## 2023-05-03 DIAGNOSIS — I25119 Atherosclerotic heart disease of native coronary artery with unspecified angina pectoris: Secondary | ICD-10-CM

## 2023-05-03 DIAGNOSIS — I2510 Atherosclerotic heart disease of native coronary artery without angina pectoris: Secondary | ICD-10-CM

## 2023-05-03 DIAGNOSIS — I5032 Chronic diastolic (congestive) heart failure: Secondary | ICD-10-CM

## 2023-05-03 NOTE — Patient Instructions (Addendum)
Medication Instructions:  Your physician recommends that you continue on your current medications as directed. Please refer to the Current Medication list given to you today.  Labwork: none  Testing/Procedures: none  Follow-Up: Your physician recommends that you schedule a follow-up appointment in: 4-6 weeks  Any Other Special Instructions Will Be Listed Below (If Applicable).  If you need a refill on your cardiac medications before your next appointment, please call your pharmacy. 

## 2023-05-03 NOTE — Progress Notes (Unsigned)
Virtual Visit via Telephone Note   Because of Billy Davis's co-morbid illnesses, he is at least at moderate risk for complications without adequate follow up.  This format is felt to be most appropriate for this patient at this time.  The patient did not have access to video technology/had technical difficulties with video requiring transitioning to audio format only (telephone).  All issues noted in this document were discussed and addressed.  No physical exam could be performed with this format.  Please refer to the patient's chart for his consent to telehealth for Billy Davis.    Date:  05/03/2023  ID:  Billy Davis, DOB 04/12/1956, MRN [id number] The patient was identified using 2 identifiers.  Patient Location: Home Provider Location: Office/Clinic PCP:  Kirstie Peri, MD  [facility] Providers Cardiologist:  Dina Rich, MD { Click to update primary MD,subspecialty MD or APP then REFRESH:1}   Evaluation Performed:  Follow-Up Visit Chief Complaint: Shortness of breath  History of Present Illness:    Billy Davis is a 67 y.o. male with a PMH of CAD, hx of stenting, HFpEF, HTN, T2DM, HLD, GERD, non-alcoholic cirrhosis, hx of prostate cancer, and obesity, who presents today for telehealth visit for follow-up.    Hx of prior bare-metal stent to left circumflex in 2008 with noted moderate diffuse disease of LAD and RCA that was treated medically. Lexiscan in 2014 showed multiple defects and normal wall motion although was thought to be mostly artifact, EF at that time was 50%.  Underwent left heart catheterization in April 2015 that showed patent LM, proximal LAD was 30%, patent left circumflex, OM1 with patent stents, proximal RCA 30% disease, EF 55 to 65%. Underwent LHC 04/2022 - report noted below.    Admitted 01/2023 for angina pectoris. Underwent LHC with DES to in stent restenosis in first OM of circ, prox, and distal RCA. Davis course complicated by  acute on chronic HFpEF. EF 50%.   I saw him for Davis follow-up on March 07, 2023. He stated he continued to note angina pectoris, not as severe as what sent him to the Davis. Described as mid chest, 2-3, nonradiating, occurring at rest and also experiencing shortness of breath at rest while watching TV. Symptoms improved since d/c. Denied any palpitations, syncope, presyncope, dizziness, orthopnea, PND, significant weight changes, acute bleeding, or claudication. BLE swelling stable.  Today he presents for follow-up visit via telehealth. Denies any chest pain.  When initially speaking with him, he says that his breathing does seem to be getting better, and he feels that this is due to his medication.  He believes that his blood thinner (Effient) is causing him to feel short of breath, as this has been going on since he left the Davis.  He later tells me during our conversation on the phone that he says his breathing is the same.  Says it is not bothersome to him.  Experiences random moments of feeling like he cannot catch his breath at rest, particularly noted when watching TV, similar to presentation from last time I saw him in the office in July. Denies any palpitations, syncope, presyncope, dizziness, orthopnea, PND, significant weight changes, acute bleeding, or claudication.  Did have an episode of leg swelling, and says he was "bit by something," and this has since resolved.   ROS:   Please see the history of present illness.    All other systems reviewed and are negative.   Prior CV studies:  The following studies were reviewed today:  Coronary stent intervention 02/18/2023:  Successful two-vessel PCI with stenting of severe in-stent restenosis in a large first OM branch of the circumflex and successful stenting of severe stenoses in the proximal and distal RCA, all lesions treated with drug-eluting stents   Recommendations: DAPT with aspirin and prasugrel for 12 months as  tolerated.  Consider long-term P2Y12 inhibition in this gentleman with multiple PCI procedures and extensive CAD.   LHC 02/18/2023:    Dist LAD-2 lesion is 65% stenosed.   Prox RCA-2 lesion is 50% stenosed. Ost RCA to Prox RCA lesion is 60% stenosed.   Prox RCA-2 lesion is 50% stenosed.   Dist RCA-1 lesion is 80% stenosed.   Dist RCA-2 lesion is 60% stenosed.   1st Diag lesion is 70% stenosed.   2nd Mrg-1 lesion is 50% stenosed.   Ost RCA to Prox RCA lesion is 60% stenosed.   2nd Mrg-2 lesion is 90% stenosed.   Dist LAD-1 lesion is 75% stenosed.   RPDA-1 lesion is 50% stenosed.   RPDA-2 lesion is 70% stenosed.   Mid LM lesion is 10% stenosed.   Prox RCA-1 lesion is 70% stenosed.   Prox RCA-2 lesion is 70% stenosed.   Non-stenotic Prox LAD to Mid LAD lesion was previously treated.   Non-stenotic Mid LAD lesion was previously treated.   Non-stenotic Mid RCA lesion was previously treated.   There is mild left ventricular systolic dysfunction.   LV end diastolic pressure is normal.   The left ventricular ejection fraction is 45-50% by visual estimate.   Recommend dual antiplatelet therapy.   PCI is to be performed with aspirin/Effient.   Multivessel CAD with vessel CAD with patent LAD stent extending from the ostium to the proximal mid segment.  There is diffuse 60 to 75% mid distal LAD disease.   Left circumflex marginal stent with distal 90% stenosis.   Diffusely diseased dominant RCA with 70% proximal and proximal to mid stenoses, patent previously placed stent before the acute margin, and progressive 80% stenosis followed by 60% distal RCA stenosis with diffuse irregularity of narrowings of 50 to 70% in the PDA vessel.   Mild LV dysfunction with EF estimate 45 to 50% with hypocontractility involving the mid anterolateral wall.  LVEDP 15 mmHg.   RECOMMENDATION: Patient will be undergoing two-vessel PCI  with Dr. Excell Seltzer to the circumflex marginal vessel and RCA immediately following  this diagnostic catheterization.  DAPT will be administered with aspirin/prasugrel.   Echo 02/17/2023:  1. Left ventricular ejection fraction, by estimation, is 50 to 55%. The  left ventricle has low normal function. The left ventricle has no regional  wall motion abnormalities. The left ventricular internal cavity size was  moderately dilated. There is mild   concentric left ventricular hypertrophy. Left ventricular diastolic  parameters were normal.   2. Right ventricular systolic function is normal. The right ventricular  size is normal.   3. Right atrial size was mildly dilated.   4. The mitral valve is grossly normal. No evidence of mitral valve  regurgitation. No evidence of mitral stenosis.   5. The aortic valve is grossly normal. There is mild calcification of the  aortic valve. There is mild thickening of the aortic valve. Aortic valve  regurgitation is not visualized. Aortic valve sclerosis is present, with  no evidence of aortic valve  stenosis.   6. Aortic dilatation noted. There is borderline dilatation of the  ascending aorta, measuring 38 mm.  Comparison(s): Changes from prior study are noted. EF slightly less than  prior but no focal wall motion abnormalities.   Conclusion(s)/Recommendation(s): Otherwise normal echocardiogram, with  minor abnormalities described in the report.  LHC 04/2022:  Conclusions: Multivessel coronary artery disease with 60% proximal, 40% mid, and diffuse 60-70% distal/apical LAD disease, 70% D1 stenosis, and multifocal 40-60% disease in the proximal through distal RCA.  Proximal/mid LAD is highly significant by hemodynamic assessment (RFR 0.79).  Proximal/mid RCA disease is not significant (RFR 0.94). Patent OM2 stent with up to 30% in-stent restenosis. Widely patent mid RCA stent. Normal left ventricular filling pressure (LVEDP 10 mmHg). Successful RFR- and OCT-guided PCI to ostial through mid LAD using Synergy 3.0 x 38 mm drug-eluting stent  (postdilated up to 3.6 mm) with 0% residual stenosis and TIMI-3 flow.   Recommendations: Dual antiplatelet therapy with aspirin and ticagrelor for at least 6 months, given patient's history of clopidogrel allergy. Aggressive secondary prevention of coronary artery disease.  Favor medical therapy of distal/apical LAD disease given distal location and small vessel size. Anticipate same-day discharge if no complications occurred during 6 hours of post PCI monitoring.  Labs/Other Tests and Data Reviewed:    EKG:  EKG is not ordered today.    Objective:    Vital Signs:  BP (!) 157/72   Pulse 67   Ht 6\' 2"  (1.88 m)   Wt 300 lb (136.1 kg)   BMI 38.52 kg/m    Due to nature of today's visit, a physical exam was unable to be performed.   ASSESSMENT & PLAN:    ***      {Are you ordering a CV Procedure (e.g. stress test, cath, DCCV, TEE, etc)?   Press F2        :[id number]}   I went into review of patient's past workup.  Echocardiogram overall benign.  Recent labs all unremarkable.  He cannot tolerate Plavix as this caused a rash.  And I recommended to him that Brilinta would not be a good antiplatelet for him as this can also cause shortness of breath for some patients.  I discussed with patient that typically shortness of breath is not a well-known side effect of via.  He may benefit from pulmonology referral as his presentation sounds atypical.  Will route note to cardiologist for further recs.  ED precautions discussed.  He verbalized understanding.  Follow-up with me in 4 to 6 weeks for office visit.  No other medication changes or labs at this time.  Time:   Today, I have spent 12 minutes with the patient with telehealth technology discussing the above problems.     Medication Adjustments/Labs and Tests Ordered: Current medicines are reviewed at length with the patient today.  Concerns regarding medicines are outlined above.   Tests Ordered: No orders of the defined types were  placed in this encounter.   Medication Changes: No orders of the defined types were placed in this encounter.   Follow Up:  {F/U Format:[id number]} {follow up:15908}  Signed, Sharlene Dory, NP  05/03/2023 4:37 PM    [facility]

## 2023-05-03 NOTE — Progress Notes (Signed)
  Patient Consent for Virtual Visit        Billy Davis has provided verbal consent on 05/03/2023 for a virtual visit (video or telephone).   CONSENT FOR VIRTUAL VISIT FOR:  Billy Davis  By participating in this virtual visit I agree to the following:  I hereby voluntarily request, consent and authorize [facility] and its employed or contracted physicians, physician assistants, nurse practitioners or other licensed health care professionals (the Practitioner), to provide me with telemedicine health care services (the "Services") as deemed necessary by the treating Practitioner. I acknowledge and consent to receive the Services by the Practitioner via telemedicine. I understand that the telemedicine visit will involve communicating with the Practitioner through live audiovisual communication technology and the disclosure of certain medical information by electronic transmission. I acknowledge that I have been given the opportunity to request an in-person assessment or other available alternative prior to the telemedicine visit and am voluntarily participating in the telemedicine visit.  I understand that I have the right to withhold or withdraw my consent to the use of telemedicine in the course of my care at any time, without affecting my right to future care or treatment, and that the Practitioner or I may terminate the telemedicine visit at any time. I understand that I have the right to inspect all information obtained and/or recorded in the course of the telemedicine visit and may receive copies of available information for a reasonable fee.  I understand that some of the potential risks of receiving the Services via telemedicine include:  Delay or interruption in medical evaluation due to technological equipment failure or disruption; Information transmitted may not be sufficient (e.g. poor resolution of images) to allow for appropriate medical decision making by the Practitioner;  and/or  In rare instances, security protocols could fail, causing a breach of personal health information.  Furthermore, I acknowledge that it is my responsibility to provide information about my medical history, conditions and care that is complete and accurate to the best of my ability. I acknowledge that Practitioner's advice, recommendations, and/or decision may be based on factors not within their control, such as incomplete or inaccurate data provided by me or distortions of diagnostic images or specimens that may result from electronic transmissions. I understand that the practice of medicine is not an exact science and that Practitioner makes no warranties or guarantees regarding treatment outcomes. I acknowledge that a copy of this consent can be made available to me via my patient portal [facility] My[facility]), or I can request a printed copy by calling the office of  [facility].    I understand that my insurance will be billed for this visit.   I have read or had this consent read to me. I understand the contents of this consent, which adequately explains the benefits and risks of the Services being provided via telemedicine.  I have been provided ample opportunity to ask questions regarding this consent and the Services and have had my questions answered to my satisfaction. I give my informed consent for the services to be provided through the use of telemedicine in my medical care

## 2023-05-06 ENCOUNTER — Ambulatory Visit (INDEPENDENT_AMBULATORY_CARE_PROVIDER_SITE_OTHER): Payer: Medicare Other | Admitting: Gastroenterology

## 2023-05-07 ENCOUNTER — Other Ambulatory Visit: Payer: Self-pay | Admitting: Cardiology

## 2023-05-07 NOTE — Telephone Encounter (Signed)
This is a Scientist, research (physical sciences) pt, seen by Dr. Wyline Mood. This medication was prescribed in the hospital by one of our Cardiologist. Please refer to Dr. Wyline Mood for this refill. Please address

## 2023-06-04 ENCOUNTER — Ambulatory Visit (INDEPENDENT_AMBULATORY_CARE_PROVIDER_SITE_OTHER): Payer: Medicare Other | Admitting: Gastroenterology

## 2023-06-06 ENCOUNTER — Ambulatory Visit: Payer: Medicare Other | Attending: Nurse Practitioner | Admitting: Nurse Practitioner

## 2023-06-06 ENCOUNTER — Encounter: Payer: Self-pay | Admitting: Nurse Practitioner

## 2023-06-06 ENCOUNTER — Ambulatory Visit: Payer: Medicare Other | Admitting: Nurse Practitioner

## 2023-06-06 VITALS — BP 154/70 | HR 60 | Ht 74.0 in | Wt 293.0 lb

## 2023-06-06 DIAGNOSIS — E785 Hyperlipidemia, unspecified: Secondary | ICD-10-CM

## 2023-06-06 DIAGNOSIS — R0602 Shortness of breath: Secondary | ICD-10-CM | POA: Diagnosis not present

## 2023-06-06 DIAGNOSIS — I10 Essential (primary) hypertension: Secondary | ICD-10-CM

## 2023-06-06 DIAGNOSIS — I2510 Atherosclerotic heart disease of native coronary artery without angina pectoris: Secondary | ICD-10-CM | POA: Diagnosis not present

## 2023-06-06 DIAGNOSIS — I5032 Chronic diastolic (congestive) heart failure: Secondary | ICD-10-CM

## 2023-06-06 MED ORDER — AMLODIPINE BESYLATE 5 MG PO TABS: 10.0000 mg | ORAL_TABLET | Freq: Every day | ORAL | 2 refills | Status: DC

## 2023-06-06 NOTE — Progress Notes (Signed)
Cardiology Office Note:  .   Date:  06/06/2023  ID:  Billy Davis, DOB [birth year], MRN [id number] PCP: Kirstie Peri, MD  [facility] Providers Cardiologist:  Dina Rich, MD    History of Present Illness: .   Billy Davis is a 67 y.o. male PMH of CAD, hx of stenting, HFpEF, HTN, T2DM, HLD, GERD, non-alcoholic cirrhosis, hx of prostate cancer, and obesity, who presents today for telehealth visit for follow-up.    Hx of prior bare-metal stent to left circumflex in 2008 with noted moderate diffuse disease of LAD and RCA that was treated medically. Lexiscan in 2014 showed multiple defects and normal wall motion although was thought to be mostly artifact, EF at that time was 50%.  Underwent left heart catheterization in April 2015 that showed patent LM, proximal LAD was 30%, patent left circumflex, OM1 with patent stents, proximal RCA 30% disease, EF 55 to 65%. Underwent LHC 04/2022 - report noted below.    Admitted 01/2023 for angina pectoris. Underwent LHC with DES to in stent restenosis in first OM of circ, prox, and distal RCA. Hospital course complicated by acute on chronic HFpEF. EF 50%.   I saw him for hospital follow-up on March 07, 2023. He stated he continued to note angina pectoris, not as severe as what sent him to the hospital. Described as mid chest, 2-3, nonradiating, occurring at rest and also experiencing shortness of breath at rest while watching TV. Symptoms improved since d/c. Denied any palpitations, syncope, presyncope, dizziness, orthopnea, PND, significant weight changes, acute bleeding, or claudication. BLE swelling stable.  05/03/2023 - Seen for telehealth visit. Denied CP. When initially speaking with him, he said breathing does seem to be getting better, and felt that this is due to his medication. He felt that blood thinner (Effient) was causing him to feel short of breath, as this had been going on since he hospital d/c.  He later told me on the phone that   breathing was the same, not bothersome.  Experienced random moments of feeling like he cannot catch breath at rest, particularly noted when watching TV, similar to last visit. Denied any chest pain, palpitations, syncope, presyncope, dizziness, orthopnea, PND, significant weight changes, acute bleeding, or claudication.   06/06/2023 - Main chief concern is shortness of breath with exertion noted when walking uphill. Says he feels as though this has gotten worse from last telephone visit. Denies any chest pain, palpitations, syncope, presyncope, dizziness, orthopnea, PND, swelling or significant weight changes, acute bleeding, or claudication. BP has been elevated recently. Said recent BP reading was 170/80.   ROS: Negative. See HPI.   Studies Reviewed: Marland Kitchen     Coronary stent intervention 02/18/2023:  Successful two-vessel PCI with stenting of severe in-stent restenosis in a large first OM branch of the circumflex and successful stenting of severe stenoses in the proximal and distal RCA, all lesions treated with drug-eluting stents   Recommendations: DAPT with aspirin and prasugrel for 12 months as tolerated.  Consider long-term P2Y12 inhibition in this gentleman with multiple PCI procedures and extensive CAD.   LHC 02/18/2023:    Dist LAD-2 lesion is 65% stenosed.   Prox RCA-2 lesion is 50% stenosed. Ost RCA to Prox RCA lesion is 60% stenosed.   Prox RCA-2 lesion is 50% stenosed.   Dist RCA-1 lesion is 80% stenosed.   Dist RCA-2 lesion is 60% stenosed.   1st Diag lesion is 70% stenosed.   2nd Mrg-1 lesion is 50% stenosed.  Ost RCA to Prox RCA lesion is 60% stenosed.   2nd Mrg-2 lesion is 90% stenosed.   Dist LAD-1 lesion is 75% stenosed.   RPDA-1 lesion is 50% stenosed.   RPDA-2 lesion is 70% stenosed.   Mid LM lesion is 10% stenosed.   Prox RCA-1 lesion is 70% stenosed.   Prox RCA-2 lesion is 70% stenosed.   Non-stenotic Prox LAD to Mid LAD lesion was previously treated.   Non-stenotic  Mid LAD lesion was previously treated.   Non-stenotic Mid RCA lesion was previously treated.   There is mild left ventricular systolic dysfunction.   LV end diastolic pressure is normal.   The left ventricular ejection fraction is 45-50% by visual estimate.   Recommend dual antiplatelet therapy.   PCI is to be performed with aspirin/Effient.   Multivessel CAD with vessel CAD with patent LAD stent extending from the ostium to the proximal mid segment.  There is diffuse 60 to 75% mid distal LAD disease.   Left circumflex marginal stent with distal 90% stenosis.   Diffusely diseased dominant RCA with 70% proximal and proximal to mid stenoses, patent previously placed stent before the acute margin, and progressive 80% stenosis followed by 60% distal RCA stenosis with diffuse irregularity of narrowings of 50 to 70% in the PDA vessel.   Mild LV dysfunction with EF estimate 45 to 50% with hypocontractility involving the mid anterolateral wall.  LVEDP 15 mmHg.   RECOMMENDATION: Patient will be undergoing two-vessel PCI  with Dr. Excell Seltzer to the circumflex marginal vessel and RCA immediately following this diagnostic catheterization.  DAPT will be administered with aspirin/prasugrel.   Echo 02/17/2023:  1. Left ventricular ejection fraction, by estimation, is 50 to 55%. The  left ventricle has low normal function. The left ventricle has no regional  wall motion abnormalities. The left ventricular internal cavity size was  moderately dilated. There is mild   concentric left ventricular hypertrophy. Left ventricular diastolic  parameters were normal.   2. Right ventricular systolic function is normal. The right ventricular  size is normal.   3. Right atrial size was mildly dilated.   4. The mitral valve is grossly normal. No evidence of mitral valve  regurgitation. No evidence of mitral stenosis.   5. The aortic valve is grossly normal. There is mild calcification of the  aortic valve. There is  mild thickening of the aortic valve. Aortic valve  regurgitation is not visualized. Aortic valve sclerosis is present, with  no evidence of aortic valve  stenosis.   6. Aortic dilatation noted. There is borderline dilatation of the  ascending aorta, measuring 38 mm.   Comparison(s): Changes from prior study are noted. EF slightly less than  prior but no focal wall motion abnormalities.   Conclusion(s)/Recommendation(s): Otherwise normal echocardiogram, with  minor abnormalities described in the report.   LHC 04/2022:  Conclusions: Multivessel coronary artery disease with 60% proximal, 40% mid, and diffuse 60-70% distal/apical LAD disease, 70% D1 stenosis, and multifocal 40-60% disease in the proximal through distal RCA.  Proximal/mid LAD is highly significant by hemodynamic assessment (RFR 0.79).  Proximal/mid RCA disease is not significant (RFR 0.94). Patent OM2 stent with up to 30% in-stent restenosis. Widely patent mid RCA stent. Normal left ventricular filling pressure (LVEDP 10 mmHg). Successful RFR- and OCT-guided PCI to ostial through mid LAD using Synergy 3.0 x 38 mm drug-eluting stent (postdilated up to 3.6 mm) with 0% residual stenosis and TIMI-3 flow.   Recommendations: Dual antiplatelet therapy with aspirin and  ticagrelor for at least 6 months, given patient's history of clopidogrel allergy. Aggressive secondary prevention of coronary artery disease.  Favor medical therapy of distal/apical LAD disease given distal location and small vessel size. Anticipate same-day discharge if no complications occurred during 6 hours of post PCI monitoring.  Risk Assessment/Calculations:    HYPERTENSION CONTROL Vitals:   06/06/23 0948 06/06/23 0951 06/06/23 1000  BP: (!) 160/90 (!) 160/90 (!) 154/70    The patient's blood pressure is elevated above target today.  In order to address the patient's elevated BP: A current anti-hypertensive medication was adjusted today.; Blood pressure  will be monitored at home to determine if medication changes need to be made.; Follow up with general cardiology has been recommended.        Physical Exam:   VS:  BP (!) 154/70 (BP Location: Right Arm, Patient Position: Sitting, Cuff Size: Large)   Pulse 60   Ht 6\' 2"  (1.88 m)   Wt 293 lb (132.9 kg)   SpO2 93%   BMI 37.62 kg/m    Wt Readings from Last 3 Encounters:  06/06/23 293 lb (132.9 kg)  05/03/23 300 lb (136.1 kg)  03/07/23 (!) 300 lb 6.4 oz (136.3 kg)    GEN: Obese, 67 y.o. male in no acute distress NECK: No JVD; No carotid bruits CARDIAC: S1/S2, RRR, no murmurs, rubs, gallops RESPIRATORY:  Clear to auscultation without rales, wheezing or rhonchi  EXTREMITIES:  No edema; No deformity   ASSESSMENT AND PLAN: .    HFpEF, Shortness of breath Stage C, NYHA class III reported symptoms. EF 50-55%. Was treated for volume overload during most recent admission. Has stated in past the he believes his shortness of breath is r/t Effient. Euvolemic and well compensated on exam. Case previously d/w Dr. Wyline Mood via Epic. 5% incidence of DOE r/t Effient. Recommended to continue Effient if at all possible as there were not any other choices for recent stents. Discussed this with patient, verbalized understanding. Reviewed Echo report with him. Will arrange 2 view CXR and PFT's for further evaluation.  Continue Coreg, Jardiance, Lisinopril, Aldactone, and Imdur. Low sodium diet, fluid restriction <2L, and daily weights encouraged. Educated to contact our office for weight gain of 2 lbs overnight or 5 lbs in one week. Heart healthy diet and regular cardiovascular exercise encouraged. ED precautions discussed.   2. Multivessel CAD, hx of remote stenting, s/p NSTEMI (01/2023) Most recent hx of LHC 01/2023 with DES to in-stent restenosis to first OM of circ, prox, and distal RCA. Denies any anginal episodes. Continue Aspirin, Coreg, Linsionpril, Effient, Imdur, and NTG PRN. Heart healthy diet and regular  cardiovascular exercise encouraged. ED precautions discussed.    3. HTN BP elevated recently, will increase Amlodipine to 10 mg daily. No other medication changes this time. Discussed to monitor BP at home at least 2 hours after medications and sitting for 5-10 minutes. Continue current medication regimen. Heart healthy diet encouraged.    4. HLD LDL not at goal. Previously referred to [facility], however pt states today he does not want to start any cholesterol medication. Discussed risks of this. He verbalized understanding and continues to state he does not want to start cholesterol medication.      Dispo: Recommended 2 month follow-up, however pt requests a 3 month follow-up. Will f/u with me/APP in 3 months or sooner if anything changes.   Signed, Sharlene Dory, NP

## 2023-06-06 NOTE — Patient Instructions (Addendum)
Medication Instructions:  Your physician has recommended you make the following change in your medication:  Please increase Amlodipine to 10 Mg daily  Continue all other medications as prescribed.   Labwork: None  Testing/Procedures: A chest x-ray takes a picture of the organs and structures inside the chest, including the heart, lungs, and blood vessels. This test can show several things, including, whether the heart is enlarges; whether fluid is building up in the lungs; and whether pacemaker / defibrillator leads are still in place. Your physician has recommended that you have a pulmonary function test. Pulmonary Function Tests are a group of tests that measure how well air moves in and out of your lungs.  Follow-Up: Your physician recommends that you schedule a follow-up appointment in: 3 Months   Any Other Special Instructions Will Be Listed Below (If Applicable).  If you need a refill on your cardiac medications before your next appointment, please call your pharmacy.

## 2023-06-07 ENCOUNTER — Telehealth: Payer: Self-pay | Admitting: Cardiology

## 2023-06-07 NOTE — Telephone Encounter (Signed)
Checking percert on the following patient for testing scheduled at [facility].    PFT'S Before and After  07/23/2023

## 2023-06-11 ENCOUNTER — Other Ambulatory Visit: Payer: Self-pay | Admitting: Cardiology

## 2023-07-23 ENCOUNTER — Ambulatory Visit (HOSPITAL_COMMUNITY)
Admission: RE | Admit: 2023-07-23 | Discharge: 2023-07-23 | Disposition: A | Discharge status: Home / Self Care | Payer: Medicare Other | Source: Ambulatory Visit | Attending: Nurse Practitioner | Admitting: Nurse Practitioner

## 2023-07-23 DIAGNOSIS — R0602 Shortness of breath: Secondary | ICD-10-CM

## 2023-07-23 LAB — PULMONARY FUNCTION TEST
DL/VA % pred: 118 %
DL/VA: 4.74 ml/min/mmHg/L
DLCO unc % pred: 108 %
DLCO unc: 32.21 ml/min/mmHg
FEF 25-75 Post: 3.9 L/s
FEF 25-75 Pre: 3.23 L/s
FEF2575-%Change-Post: 20 %
FEF2575-%Pred-Post: 127 %
FEF2575-%Pred-Pre: 105 %
FEV1-%Change-Post: 6 %
FEV1-%Pred-Post: 92 %
FEV1-%Pred-Pre: 86 %
FEV1-Post: 3.63 L
FEV1-Pre: 3.4 L
FEV1FVC-%Change-Post: 1 %
FEV1FVC-%Pred-Pre: 104 %
FEV6-%Change-Post: 4 %
FEV6-%Pred-Post: 89 %
FEV6-%Pred-Pre: 85 %
FEV6-Post: 4.48 L
FEV6-Pre: 4.3 L
FEV6FVC-%Change-Post: 0 %
FEV6FVC-%Pred-Post: 103 %
FEV6FVC-%Pred-Pre: 103 %
FVC-%Change-Post: 4 %
FVC-%Pred-Post: 86 %
FVC-%Pred-Pre: 82 %
FVC-Post: 4.56 L
FVC-Pre: 4.36 L
Post FEV1/FVC ratio: 80 %
Post FEV6/FVC ratio: 98 %
Pre FEV1/FVC ratio: 78 %
Pre FEV6/FVC Ratio: 99 %
RV % pred: 109 %
RV: 2.84 L
TLC % pred: 96 %
TLC: 7.54 L

## 2023-07-23 MED ORDER — ALBUTEROL SULFATE (2.5 MG/3ML) 0.083% IN NEBU
2.5000 mg | INHALATION_SOLUTION | Freq: Once | RESPIRATORY_TRACT | Status: AC
  Administered 2023-07-23: 2.5 mg via RESPIRATORY_TRACT

## 2023-08-05 ENCOUNTER — Ambulatory Visit: Payer: Medicare Other | Admitting: Cardiology

## 2023-08-15 IMAGING — US US ABDOMEN COMPLETE
1 series · 14 of 25 positions shown · non-contrast
Comparison: 11/25/2020

CLINICAL DATA: Cirrhosis

EXAM:
ABDOMEN ULTRASOUND COMPLETE

[Series 1: us abdomen complete · 14 of 88 slices shown]
[im 1/88]
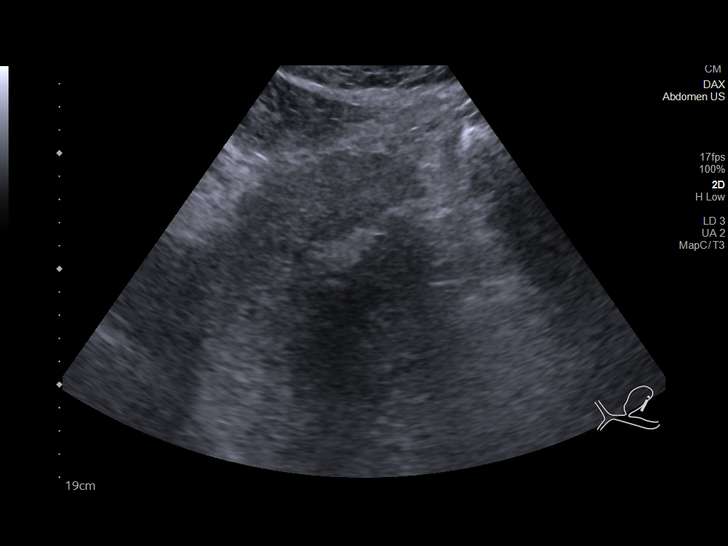
[im 8/88]
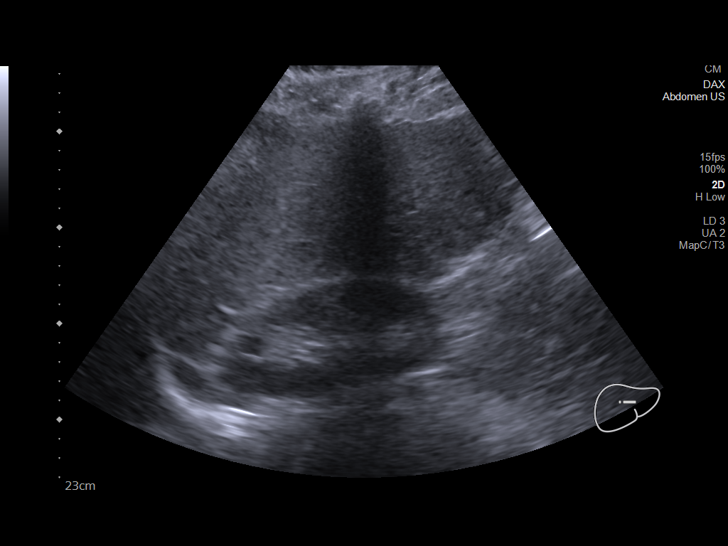
[im 15/88]
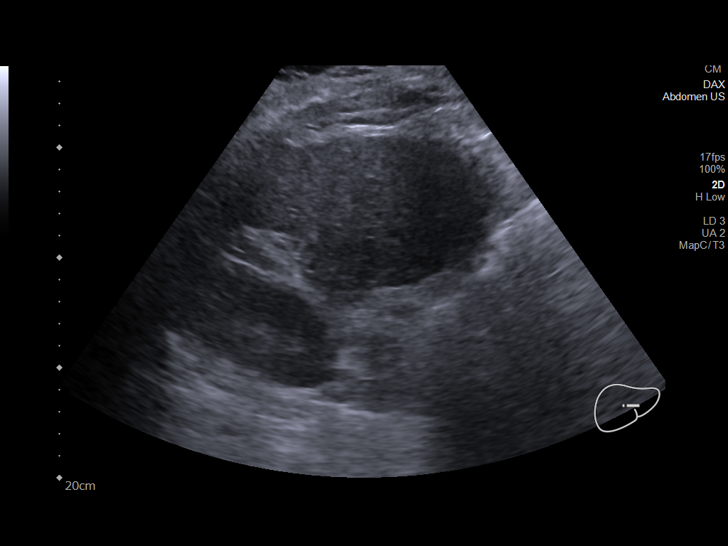
[im 22/88]
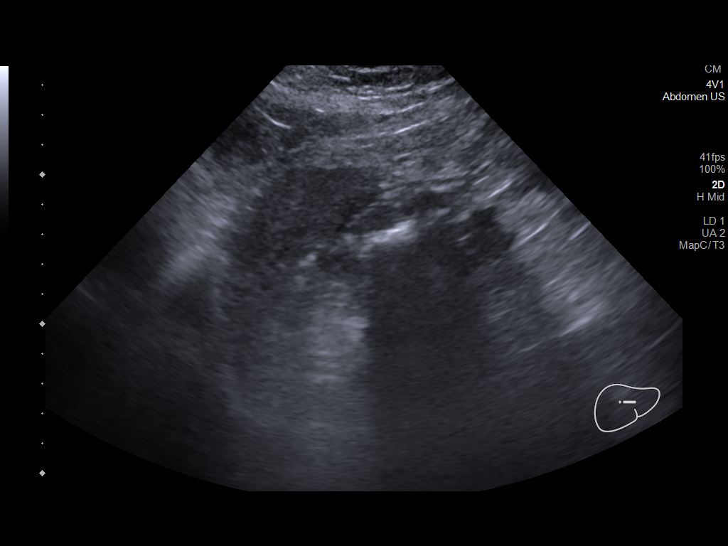
[im 30/88]
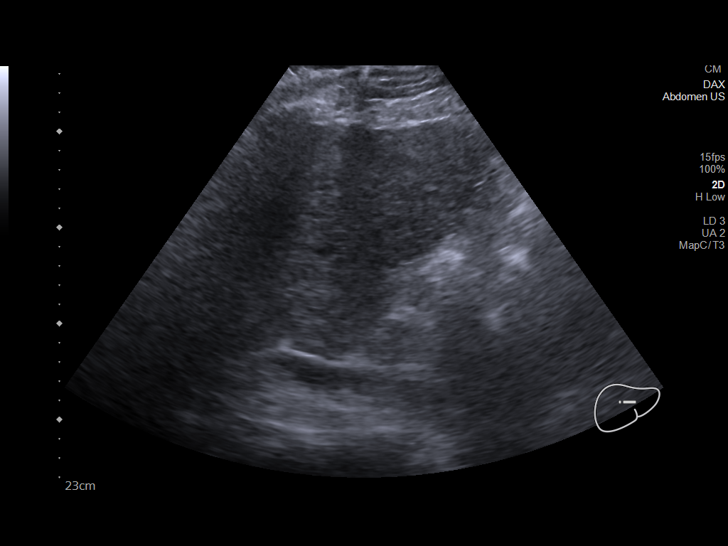
[im 33/88]
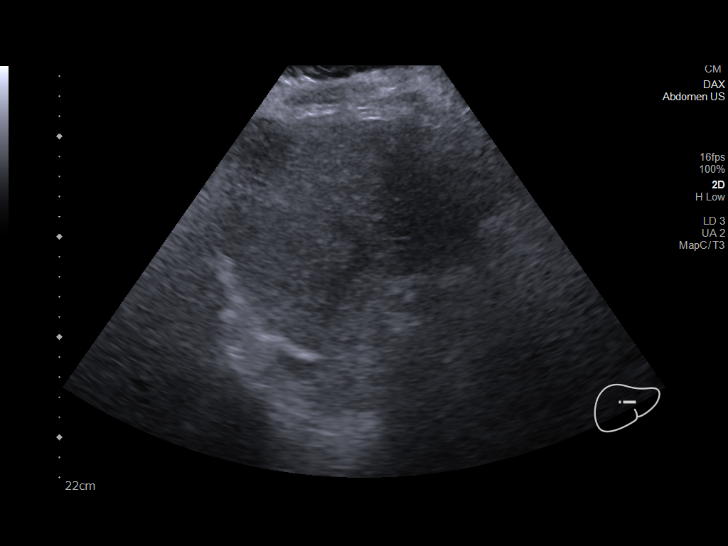
[im 40/88]
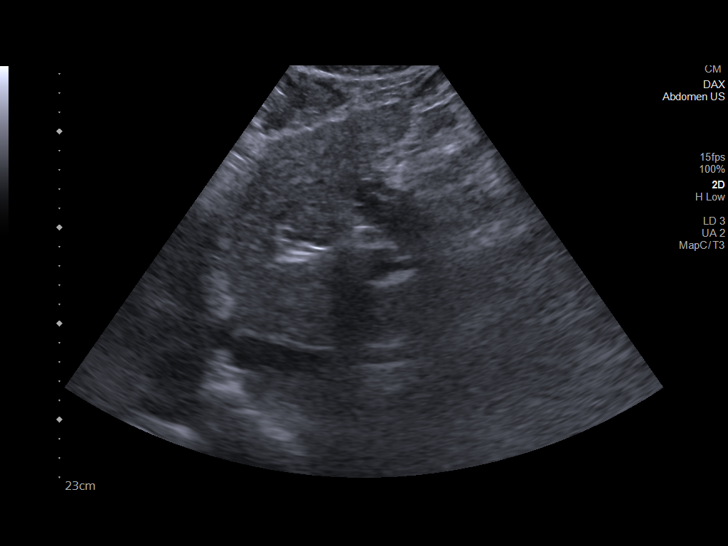
[im 48/88]
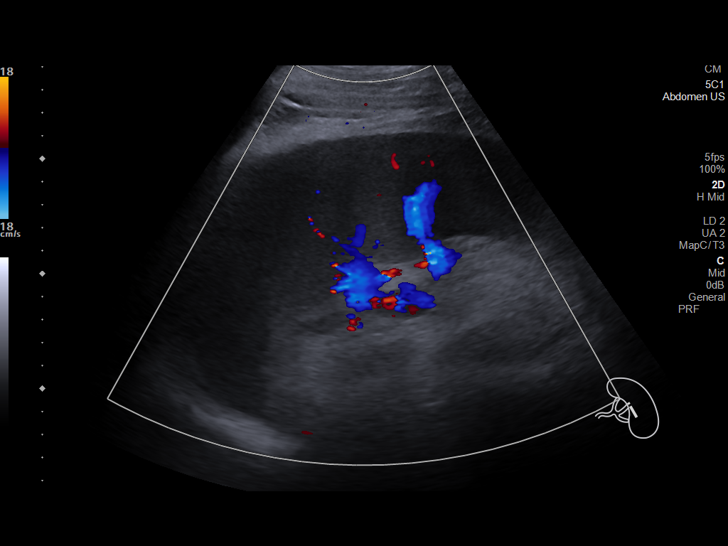
[im 55/88]
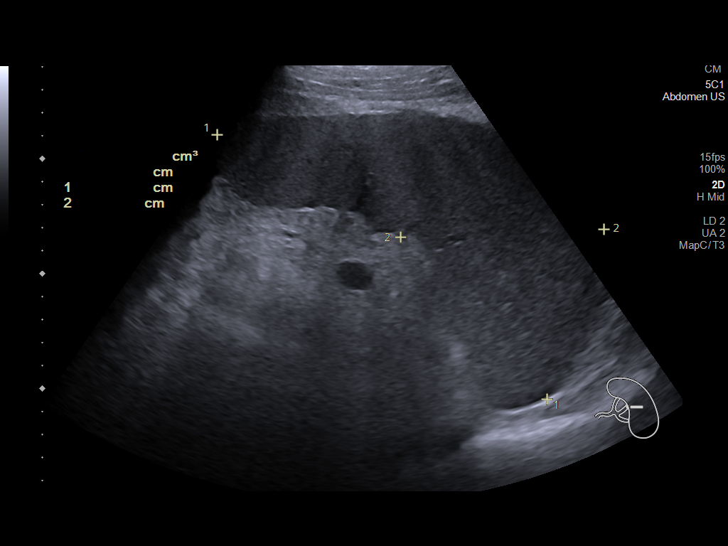
[im 59/88]
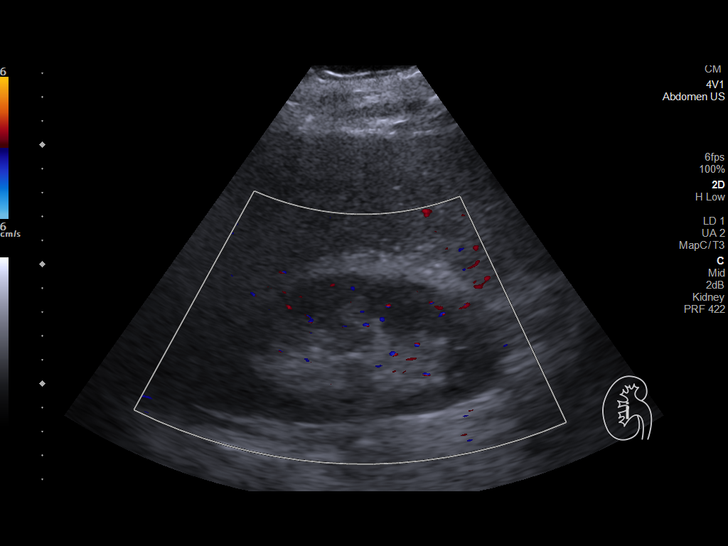
[im 66/88]
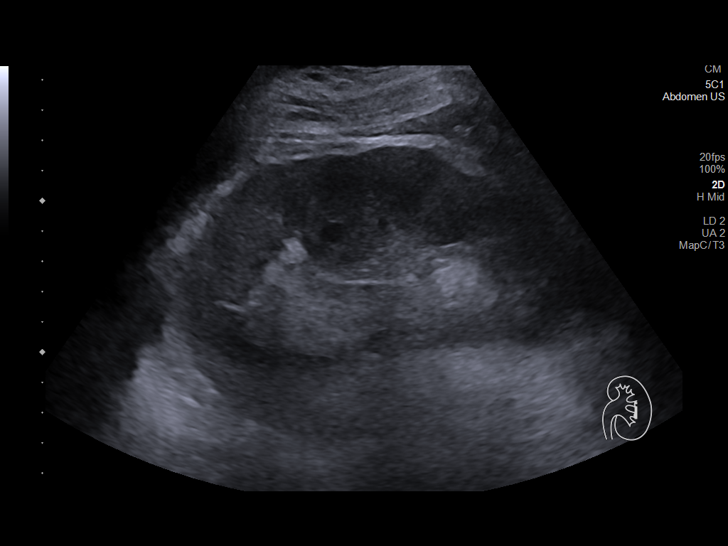
[im 73/88]
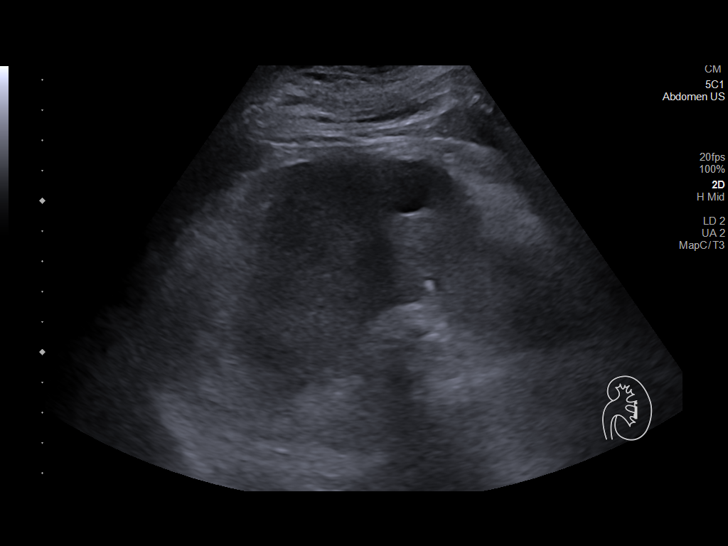
[im 80/88]
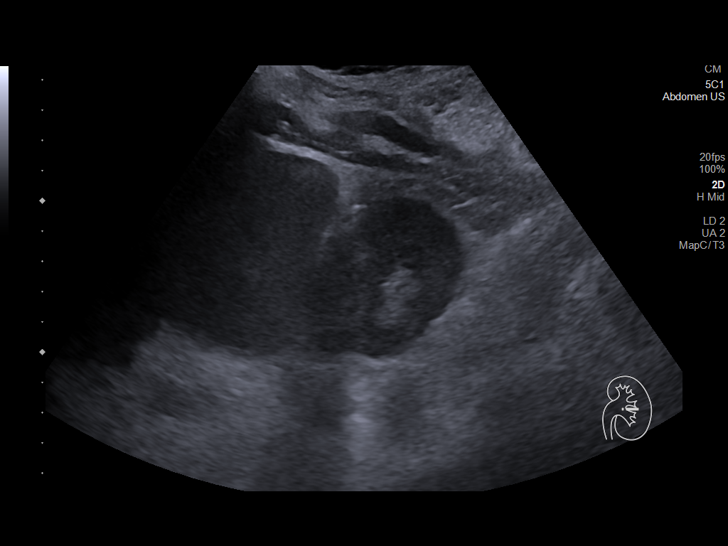
[im 88/88]
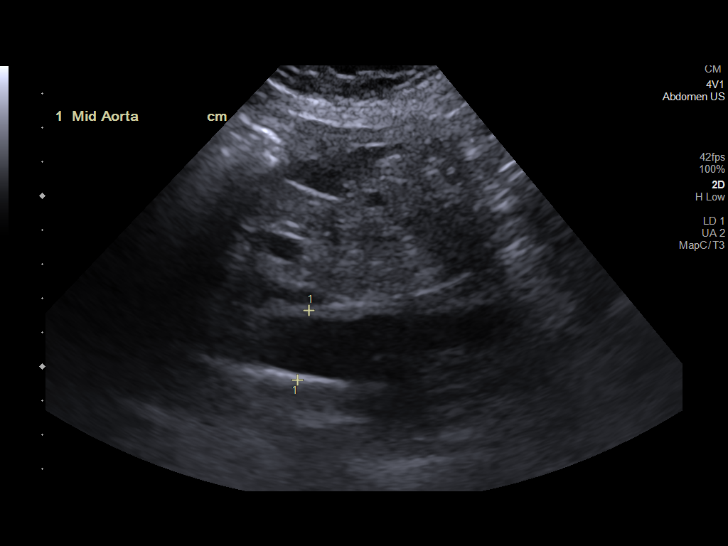

[14 of 25 positions shown; findings below may reference images not displayed]

FINDINGS: Gallbladder: Surgically absent.

Common bile duct: Diameter: 6 mm

Liver: Heterogeneous increased liver echotexture consistent with
history of cirrhosis. No focal parenchymal abnormalities or
intrahepatic biliary duct dilation. Portal vein is patent on color
Doppler imaging with normal direction of blood flow towards the
liver.

IVC: No abnormality visualized.

Pancreas: Visualized portion unremarkable.

Spleen: Spleen is enlarged measuring 17.2 x 18.4 x 8.9 cm, for a
volume of 2757 cc. No focal parenchymal abnormality.

Right Kidney: Length: 12.9 cm. Echogenicity within normal limits. No
mass or hydronephrosis visualized.

Left Kidney: Length: 12.9 cm. Normal echogenicity. No
hydronephrosis. 2 cm simple cortical cyst.

Abdominal aorta: No aneurysm visualized.

Other findings: None.
IMPRESSION: 1. Stable findings of hepatic cirrhosis without focal liver
abnormality.
2. Stable splenomegaly.
3. Otherwise unremarkable exam.

## 2023-08-24 IMAGING — DX DG CHEST 1V PORT
2 series · 2 of 2 positions shown · non-contrast
Comparison: Chest radiograph 06/03/2020.

CLINICAL DATA: Chest tightness and shortness of breath.

EXAM:
PORTABLE CHEST 1 VIEW

[chest ap (1 of 2)]
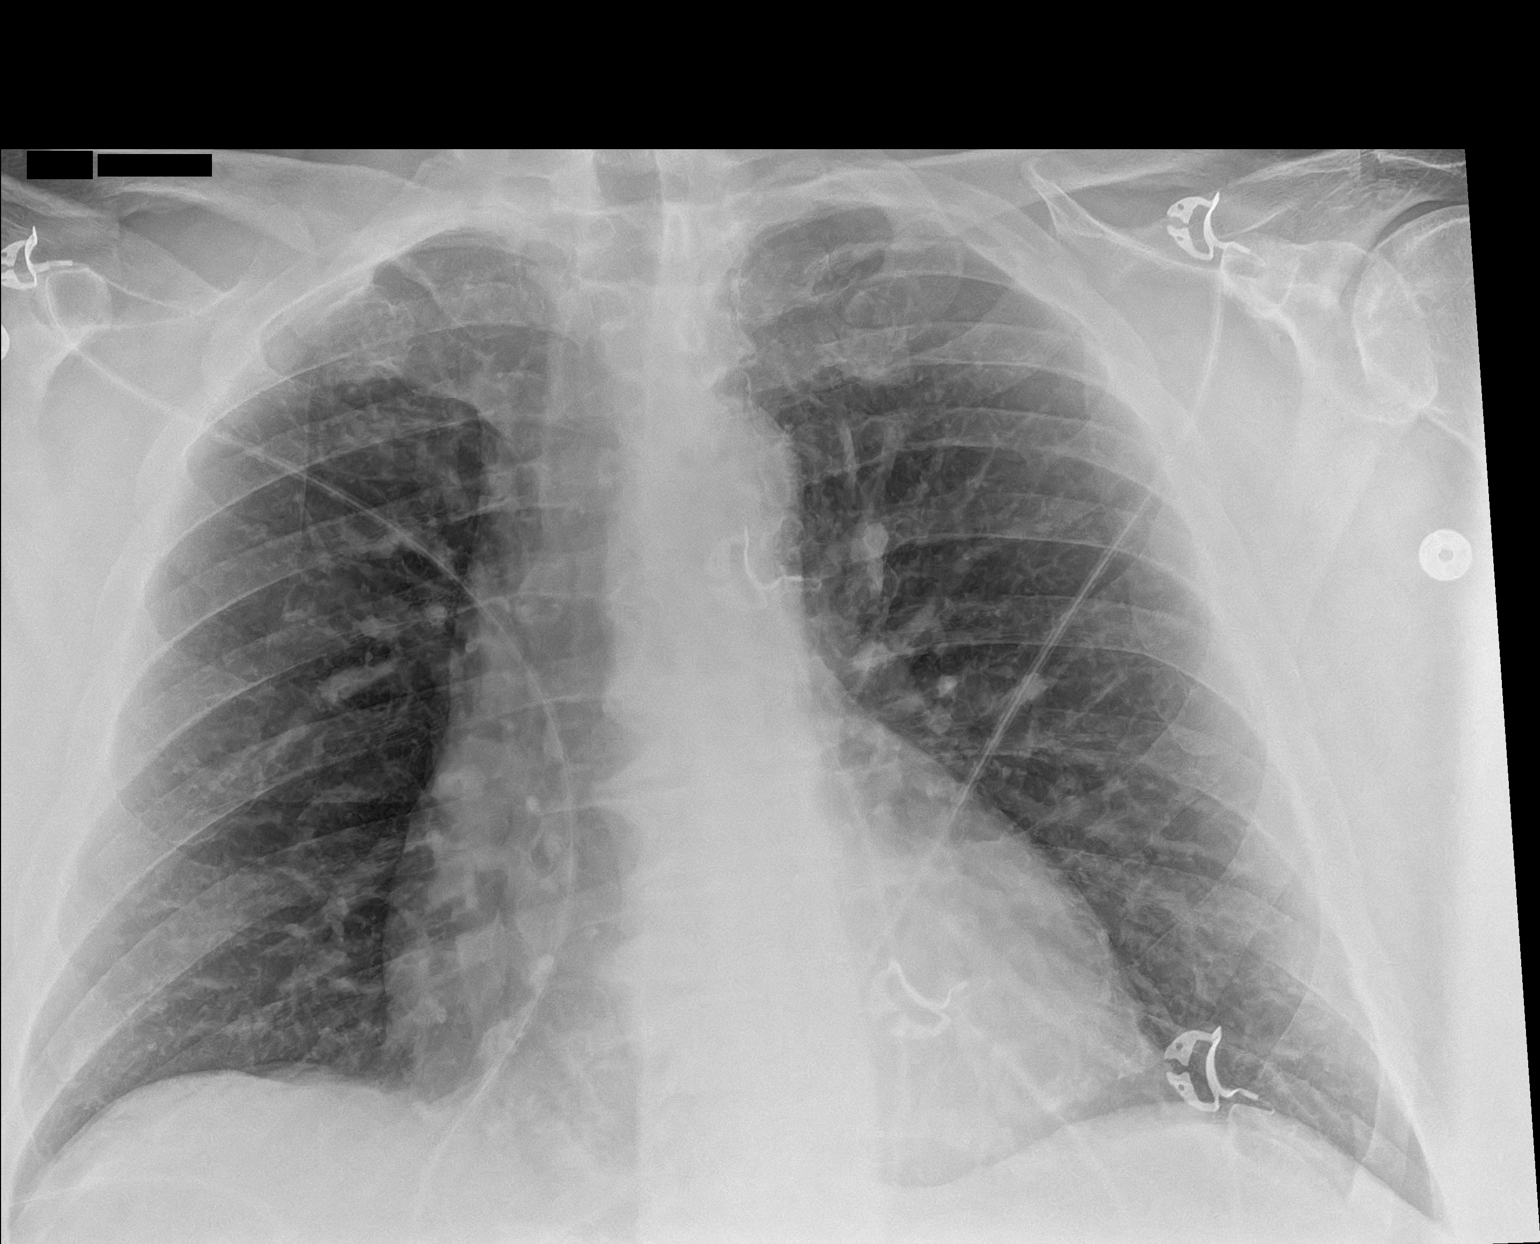

[chest ap (2 of 2)]
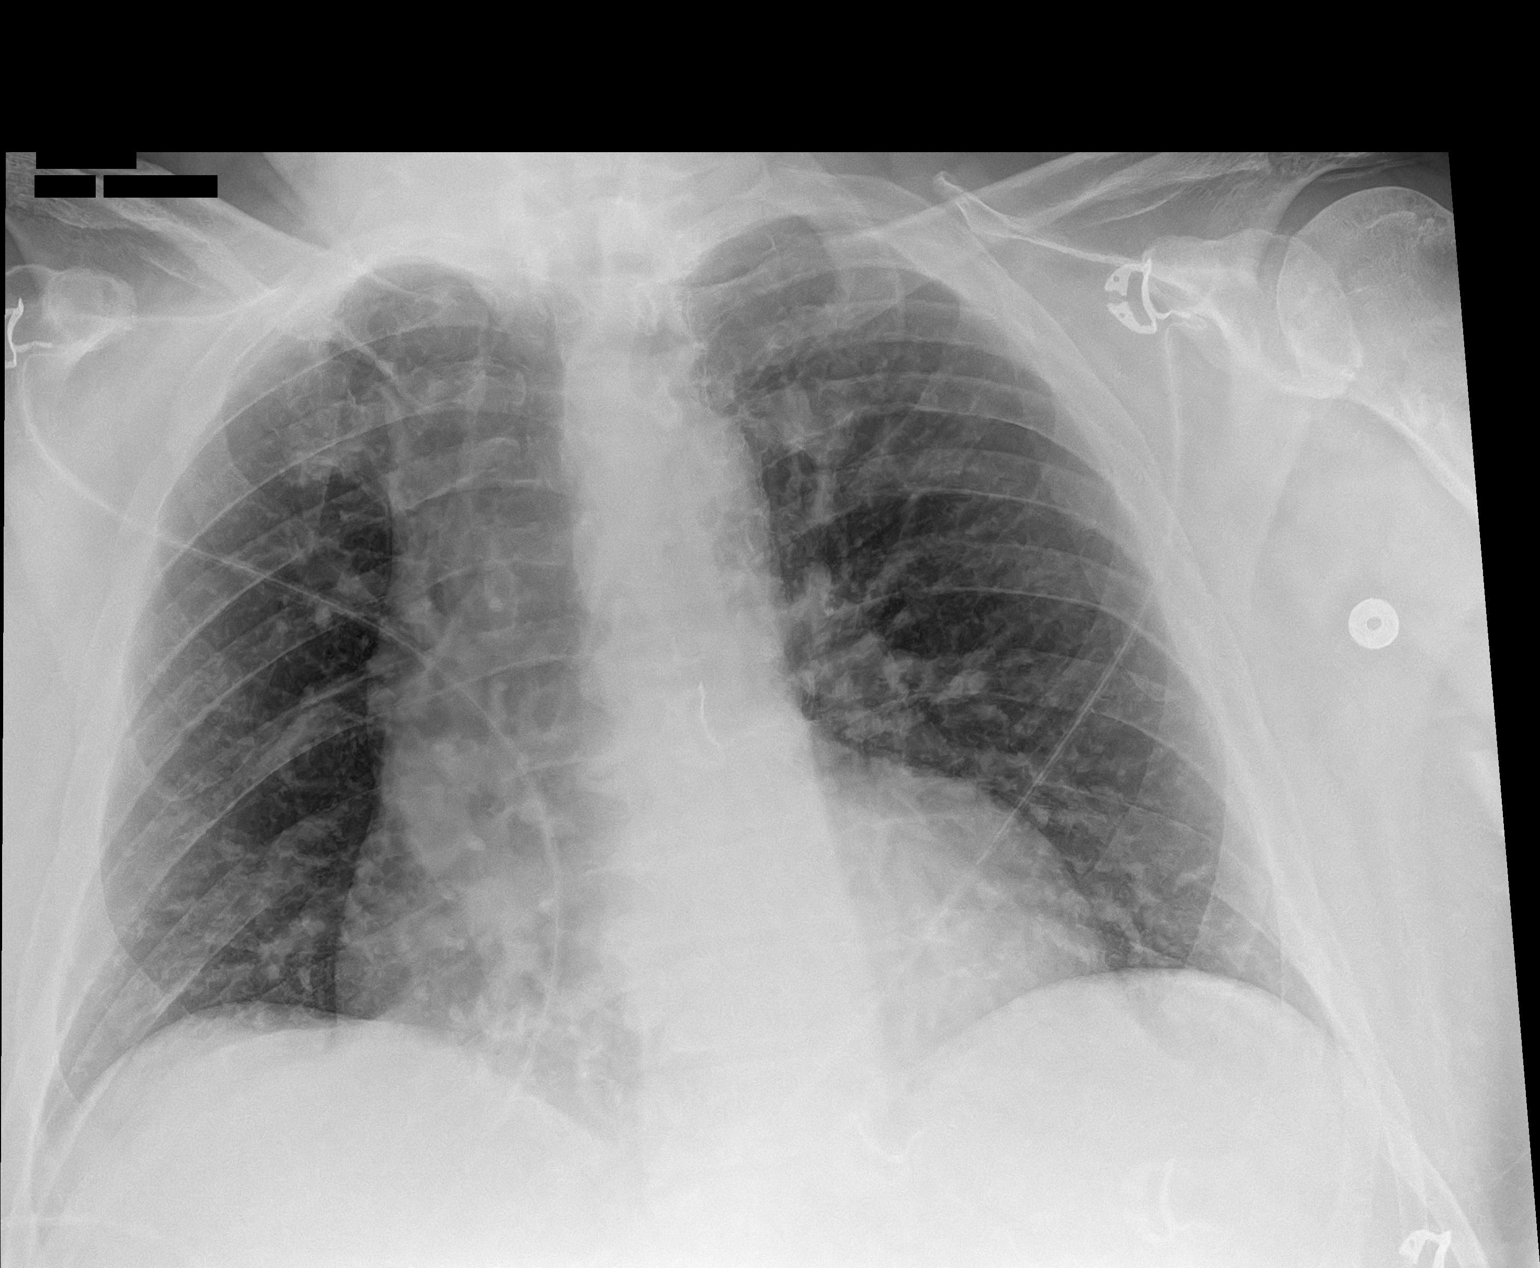

[2 of 2 positions shown; findings below may reference images not displayed]

FINDINGS: Monitoring leads overlie the patient. Stable cardiomegaly. No large
area pulmonary consolidation. No pleural effusion or pneumothorax.
Thoracic spine degenerative changes.
IMPRESSION: No acute cardiopulmonary process.  Cardiomegaly.

## 2023-08-30 ENCOUNTER — Encounter: Payer: Self-pay | Admitting: Nurse Practitioner

## 2023-08-30 ENCOUNTER — Ambulatory Visit: Payer: Medicare Other | Attending: Nurse Practitioner | Admitting: Nurse Practitioner

## 2023-08-30 VITALS — BP 142/82 | HR 70 | Ht 74.0 in | Wt 297.8 lb

## 2023-08-30 DIAGNOSIS — I5032 Chronic diastolic (congestive) heart failure: Secondary | ICD-10-CM | POA: Diagnosis not present

## 2023-08-30 DIAGNOSIS — R0602 Shortness of breath: Secondary | ICD-10-CM | POA: Diagnosis not present

## 2023-08-30 DIAGNOSIS — I10 Essential (primary) hypertension: Secondary | ICD-10-CM

## 2023-08-30 DIAGNOSIS — E785 Hyperlipidemia, unspecified: Secondary | ICD-10-CM

## 2023-08-30 DIAGNOSIS — E669 Obesity, unspecified: Secondary | ICD-10-CM

## 2023-08-30 DIAGNOSIS — I25119 Atherosclerotic heart disease of native coronary artery with unspecified angina pectoris: Secondary | ICD-10-CM | POA: Diagnosis not present

## 2023-08-30 MED ORDER — NITROGLYCERIN 0.4 MG SL SUBL: 0.4000 mg | SUBLINGUAL_TABLET | SUBLINGUAL | 3 refills | Status: AC | PRN

## 2023-08-30 MED ORDER — ISOSORBIDE MONONITRATE ER 30 MG PO TB24: 30.0000 mg | ORAL_TABLET | Freq: Every day | ORAL | 1 refills | Status: AC

## 2023-08-30 NOTE — Patient Instructions (Addendum)
 Medication Instructions:  Your physician has recommended you make the following change in your medication:  Please Increase isosorbide  mononitrate (IMDUR ) to 30 MG daily   Labwork: None   Testing/Procedures: None   Follow-Up: Your physician recommends that you schedule a follow-up appointment in: 6 months   Any Other Special Instructions Will Be Listed Below (If Applicable).  If you need a refill on your cardiac medications before your next appointment, please call your pharmacy.

## 2023-08-30 NOTE — Progress Notes (Signed)
 Cardiology Office Note:  .   Date:  08/30/2023 ID:  Billy Davis, DOB 1956/05/10, MRN [id number] PCP: Maree Isles, MD  [facility] Providers Cardiologist:  Alvan Carrier, MD    History of Present Illness: .   Billy Davis is a 68 y.o. male PMH of CAD, hx of stenting, HFpEF, HTN, T2DM, HLD, GERD, non-alcoholic cirrhosis, hx of prostate cancer, and obesity, who presents today for follow-up.    Hx of prior bare-metal stent to left circumflex in 2008 with noted moderate diffuse disease of LAD and RCA that was treated medically. Lexiscan  in 2014 showed multiple defects and normal wall motion although was thought to be mostly artifact, EF at that time was 50%.  Underwent left heart catheterization in April 2015 that showed patent LM, proximal LAD was 30%, patent left circumflex, OM1 with patent stents, proximal RCA 30% disease, EF 55 to 65%. Underwent LHC 04/2022 - report noted below.    Admitted 01/2023 for angina pectoris. Underwent LHC with DES to in stent restenosis in first OM of circ, prox, and distal RCA. Hospital course complicated by acute on chronic HFpEF. EF 50%.   I saw him for hospital follow-up on March 07, 2023. He stated he continued to note angina pectoris, not as severe as what sent him to the hospital. Described as mid chest, 2-3, nonradiating, occurring at rest and also experiencing shortness of breath at rest while watching TV. Symptoms improved since d/c. Denied any palpitations, syncope, presyncope, dizziness, orthopnea, PND, significant weight changes, acute bleeding, or claudication. BLE swelling stable.  06/06/2023 - Main chief concern is shortness of breath with exertion noted when walking uphill. Says he feels as though this has gotten worse from last telephone visit. Denies any chest pain, palpitations, syncope, presyncope, dizziness, orthopnea, PND, swelling or significant weight changes, acute bleeding, or claudication. BP has been elevated recently. Said recent  BP reading was 170/80.   08/30/2023 - Presents today for follow-up. Continues to note stable SHOB with exertion while walking uphill. He attributes this to him taking Effient . Had an episode of chest pain recently, was located along middle of chest, said it occurred straight up and down said he felt nausea/sick with episode. Took an NTG and then felt fine, denies any recurrence. Denies any palpitations, syncope, presyncope, dizziness, orthopnea, PND, swelling or significant weight changes, acute bleeding, or claudication.  ROS: Negative. See HPI.   Studies Reviewed: Billy Davis     Coronary stent intervention 02/18/2023:  Successful two-vessel PCI with stenting of severe in-stent restenosis in a large first OM branch of the circumflex and successful stenting of severe stenoses in the proximal and distal RCA, all lesions treated with drug-eluting stents   Recommendations: DAPT with aspirin  and prasugrel  for 12 months as tolerated.  Consider long-term P2Y12 inhibition in this gentleman with multiple PCI procedures and extensive CAD.   LHC 02/18/2023:    Dist LAD-2 lesion is 65% stenosed.   Prox RCA-2 lesion is 50% stenosed. Ost RCA to Prox RCA lesion is 60% stenosed.   Prox RCA-2 lesion is 50% stenosed.   Dist RCA-1 lesion is 80% stenosed.   Dist RCA-2 lesion is 60% stenosed.   1st Diag lesion is 70% stenosed.   2nd Mrg-1 lesion is 50% stenosed.   Ost RCA to Prox RCA lesion is 60% stenosed.   2nd Mrg-2 lesion is 90% stenosed.   Dist LAD-1 lesion is 75% stenosed.   RPDA-1 lesion is 50% stenosed.   RPDA-2 lesion is 70%  stenosed.   Mid LM lesion is 10% stenosed.   Prox RCA-1 lesion is 70% stenosed.   Prox RCA-2 lesion is 70% stenosed.   Non-stenotic Prox LAD to Mid LAD lesion was previously treated.   Non-stenotic Mid LAD lesion was previously treated.   Non-stenotic Mid RCA lesion was previously treated.   There is mild left ventricular systolic dysfunction.   LV end diastolic pressure is normal.    The left ventricular ejection fraction is 45-50% by visual estimate.   Recommend dual antiplatelet therapy.   PCI is to be performed with aspirin /Effient .   Multivessel CAD with vessel CAD with patent LAD stent extending from the ostium to the proximal mid segment.  There is diffuse 60 to 75% mid distal LAD disease.   Left circumflex marginal stent with distal 90% stenosis.   Diffusely diseased dominant RCA with 70% proximal and proximal to mid stenoses, patent previously placed stent before the acute margin, and progressive 80% stenosis followed by 60% distal RCA stenosis with diffuse irregularity of narrowings of 50 to 70% in the PDA vessel.   Mild LV dysfunction with EF estimate 45 to 50% with hypocontractility involving the mid anterolateral wall.  LVEDP 15 mmHg.   RECOMMENDATION: Patient will be undergoing two-vessel PCI  with Dr. Wonda to the circumflex marginal vessel and RCA immediately following this diagnostic catheterization.  DAPT will be administered with aspirin /prasugrel .   Echo 02/17/2023:  1. Left ventricular ejection fraction, by estimation, is 50 to 55%. The  left ventricle has low normal function. The left ventricle has no regional  wall motion abnormalities. The left ventricular internal cavity size was  moderately dilated. There is mild   concentric left ventricular hypertrophy. Left ventricular diastolic  parameters were normal.   2. Right ventricular systolic function is normal. The right ventricular  size is normal.   3. Right atrial size was mildly dilated.   4. The mitral valve is grossly normal. No evidence of mitral valve  regurgitation. No evidence of mitral stenosis.   5. The aortic valve is grossly normal. There is mild calcification of the  aortic valve. There is mild thickening of the aortic valve. Aortic valve  regurgitation is not visualized. Aortic valve sclerosis is present, with  no evidence of aortic valve  stenosis.   6. Aortic dilatation  noted. There is borderline dilatation of the  ascending aorta, measuring 38 mm.   Comparison(s): Changes from prior study are noted. EF slightly less than prior but no focal wall motion abnormalities.   Conclusion(s)/Recommendation(s): Otherwise normal echocardiogram, with minor abnormalities described in the report.   LHC 04/2022:  Conclusions: Multivessel coronary artery disease with 60% proximal, 40% mid, and diffuse 60-70% distal/apical LAD disease, 70% D1 stenosis, and multifocal 40-60% disease in the proximal through distal RCA.  Proximal/mid LAD is highly significant by hemodynamic assessment (RFR 0.79).  Proximal/mid RCA disease is not significant (RFR 0.94). Patent OM2 stent with up to 30% in-stent restenosis. Widely patent mid RCA stent. Normal left ventricular filling pressure (LVEDP 10 mmHg). Successful RFR- and OCT-guided PCI to ostial through mid LAD using Synergy 3.0 x 38 mm drug-eluting stent (postdilated up to 3.6 mm) with 0% residual stenosis and TIMI-3 flow.   Recommendations: Dual antiplatelet therapy with aspirin  and ticagrelor  for at least 6 months, given patient's history of clopidogrel allergy. Aggressive secondary prevention of coronary artery disease.  Favor medical therapy of distal/apical LAD disease given distal location and small vessel size. Anticipate same-day discharge if no complications  occurred during 6 hours of post PCI monitoring.  Physical Exam:   VS:  BP (!) 142/82   Pulse 70   Ht 6' 2 (1.88 m)   Wt 297 lb 12.8 oz (135.1 kg)   SpO2 95%   BMI 38.24 kg/m    Wt Readings from Last 3 Encounters:  08/30/23 297 lb 12.8 oz (135.1 kg)  06/06/23 293 lb (132.9 kg)  05/03/23 300 lb (136.1 kg)    GEN: Obese, 68 y.o. male in no acute distress NECK: No JVD; No carotid bruits CARDIAC: S1/S2, RRR, no murmurs, rubs, gallops RESPIRATORY:  Clear to auscultation without rales, wheezing or rhonchi  EXTREMITIES:  No edema; No deformity   ASSESSMENT AND PLAN: .     HFpEF, Shortness of breath Stage C, NYHA class III reported symptoms. EF 50-55%. Was treated for volume overload during most recent admission. Has stated in past the he believes his shortness of breath is r/t Effient . Euvolemic and well compensated on exam. Case previously d/w Dr. Alvan via Epic. 5% incidence of DOE r/t Effient . Recommended to continue Effient  if at all possible as there were not any other choices for recent stents. Discussed this again with patient. Reviewed Echo report with him. CXR and PFT reports WNL. Continue Coreg , Jardiance , Lisinopril , Aldactone . Will increase Imdur  to 30 mg daily as mentioned below. Low sodium diet, fluid restriction <2L, and daily weights encouraged. Educated to contact our office for weight gain of 2 lbs overnight or 5 lbs in one week. Heart healthy diet and regular cardiovascular exercise encouraged. ED precautions discussed. Will route note to Dr. Alvan regarding Effient .   2. Multivessel CAD, hx of remote stenting, s/p NSTEMI (01/2023) Most recent hx of LHC 01/2023 with DES to in-stent restenosis to first OM of circ, prox, and distal RCA. Does admit to recent anginal episode,see above. Will increase Imdur  to 30 mg daily. Continue Aspirin , Coreg , Linsionpril, Effient , and NTG PRN. Heart healthy diet and regular cardiovascular exercise encouraged. ED precautions discussed.    3. HTN BP improved but still elevated and not at goal, will increase Imdur  to 30 mg daily as mentioned above. No other medication changes this time. Discussed to monitor BP at home at least 2 hours after medications and sitting for 5-10 minutes. Continue current medication regimen. Heart healthy diet encouraged.    4. HLD LDL not at goal. Previously referred to [facility], however pt has declined further medical therapy and have discussed risks of this. Will request labs from Dr. Maree (PCP) when these is available.   5. Obesity Weight loss via diet and exercise encouraged.  Discussed the impact being overweight would have on cardiovascular risk.  Dispo: Will provide refill per his request. Follow-up with Dr. Alvan or APP in 6 months or sooner if anything changes.   Signed, Almarie Crate, NP

## 2023-09-06 ENCOUNTER — Ambulatory Visit: Payer: Medicare Other | Admitting: Nurse Practitioner

## 2023-09-09 ENCOUNTER — Telehealth: Payer: Self-pay | Admitting: Nurse Practitioner

## 2023-09-09 NOTE — Telephone Encounter (Signed)
 Left patient message informing him of recommendations

## 2023-09-09 NOTE — Telephone Encounter (Signed)
-----   Message from Almarie Crate sent at 09/09/2023  9:05 AM EST ----- Please let patient know that I have consulted Dr. Alvan regarding his Effient .   Dr. Alvan recommends continuing Effient . He did not tolerate plavix or brilinta , no other choices. No therapy would be high risk for stent occlusion and heart attack.  Thank you!  Best,  Almarie Crate, NP ----- Message ----- From: Alvan Dorn FALCON, MD Sent: 09/04/2023   1:26 PM EST To: Almarie Crate, NP  Agree continue effient . Did not tolerate plavix or brilinta , no other choices. No therapy would be high risk for stent occlusion and MI  J BrancH MD ----- Message ----- From: Crate Almarie, NP Sent: 09/01/2023   8:51 PM EST To: Dorn FALCON Alvan, MD  Recommended to continue Effient  for recommended DAPT timeframe. Wanted to see if you had any other suggestions.   Thanks!   Best, Almarie Crate, NP

## 2023-12-31 ENCOUNTER — Other Ambulatory Visit: Payer: Self-pay | Admitting: Nurse Practitioner

## 2024-02-18 ENCOUNTER — Encounter (INDEPENDENT_AMBULATORY_CARE_PROVIDER_SITE_OTHER): Payer: Self-pay | Admitting: *Deleted

## 2024-02-27 ENCOUNTER — Ambulatory Visit: Payer: Medicare Other | Admitting: Nurse Practitioner

## 2024-02-27 ENCOUNTER — Ambulatory Visit (INDEPENDENT_AMBULATORY_CARE_PROVIDER_SITE_OTHER): Admitting: Gastroenterology

## 2024-02-27 ENCOUNTER — Encounter (INDEPENDENT_AMBULATORY_CARE_PROVIDER_SITE_OTHER): Payer: Self-pay | Admitting: Gastroenterology

## 2024-02-27 ENCOUNTER — Other Ambulatory Visit (INDEPENDENT_AMBULATORY_CARE_PROVIDER_SITE_OTHER): Payer: Self-pay | Admitting: Gastroenterology

## 2024-02-27 VITALS — BP 154/77 | HR 69 | Temp 98.1°F | Ht 74.0 in | Wt 303.2 lb

## 2024-02-27 DIAGNOSIS — K7460 Unspecified cirrhosis of liver: Secondary | ICD-10-CM | POA: Diagnosis not present

## 2024-02-27 DIAGNOSIS — K625 Hemorrhage of anus and rectum: Secondary | ICD-10-CM

## 2024-02-27 DIAGNOSIS — K7581 Nonalcoholic steatohepatitis (NASH): Secondary | ICD-10-CM | POA: Diagnosis not present

## 2024-02-27 DIAGNOSIS — K219 Gastro-esophageal reflux disease without esophagitis: Secondary | ICD-10-CM

## 2024-02-27 DIAGNOSIS — K589 Irritable bowel syndrome without diarrhea: Secondary | ICD-10-CM | POA: Diagnosis not present

## 2024-02-27 DIAGNOSIS — K580 Irritable bowel syndrome with diarrhea: Secondary | ICD-10-CM

## 2024-02-27 DIAGNOSIS — R0989 Other specified symptoms and signs involving the circulatory and respiratory systems: Secondary | ICD-10-CM

## 2024-02-27 NOTE — Patient Instructions (Addendum)
-   Schedule EGD and colonoscopy - Check CBC, MELD labs and AFP - Schedule liver US  - Reduce salt intake to <2 g per day - do not add salt to food and avoid processed meals. - Schedule EGD - Can take Tylenol  max of 2 g per day (650 mg q8h) for pain - Avoid NSAIDs for pain - Avoid eating raw oysters/shellfish - Protein shake (Ensure or Boost) every night before going to sleep - Continue pantoprazole  40 mg daily -Continue Coreg  25 mg twice a day -If persistent bleeding, may start MiraLAX daily to keep stools soft to watery. -Follow-up closely with PCP regarding other causes of throat clearing including postnasal drip.

## 2024-02-27 NOTE — Progress Notes (Signed)
 Toribio Fortune, M.D. Gastroenterology & Hepatology [facility] Gastroenterology [address] [place], [place] [address]  Primary Care Physician: Maree Isles, MD [address] [place] [address]  I will communicate my assessment and recommendations to the referring MD via EMR.  Problems: NASH cirrhosis GERD Grade I EV IBS  History of Present Illness: Billy Davis is a 68 y.o. male with past medical history of NASH cirrhosis, prostate cancer, non bleeding G1 EV, CAD, DM, GERD, HLD, obesity, HTN , who presents for follow up of cirrhosis, GERD and IBS.  The patient was last seen on 10/23/2022. At that time, the patient was scheduled for EGD but he did not proceed with this (he has canceled at least 3 times).  At that visit, the patient requested to have a colonoscopy performed for screening purposes but he was not due for repeat and this was not ordered.  Patient reports in June 2024 he had a heart attack and had two stents were placed. He was put on aspirin  and then another antiplatelet. He reports that the antiplatelet was discontinued around December. He has been on Coreg  25 mg BID.  Patient reports that he has presented issues with rectal bleeding for the last year intermittently. He states he is having this every other month, small amount. He is moving his bowels 2-3  times per day without any straining.   Also endorses some lower abdominal pain intermittently for the last 3-4 months.  He also reports that he has to constantly clearing his throat for the last 3-4 months. No heartburn, odynophagia, dysphagia. He takes Protonix  40 mg qday.  The patient denies having any nausea, vomiting, fever, chills, hematochezia, melena, hematemesis, abdominal distention, abdominal pain, diarrhea, jaundice, pruritus or weight loss.  Cirrhosis related questions: Hematemesis/coffee ground emesis: No History of variceal bleeding: No Abdominal pain: Yes Abdominal  distention/worsening ascites: No Fever/chills: No Episodes of confusion/disorientation: No Taking diuretics?: was prescribed spironolactone  25 mg qday but he is not taking this as it led to urinary incontinence Prior history of banding?: No Prior episodes of SBP: No Last time liver imaging was performed: 11/01/2022, ultrasound did not show any mass in the liver, absent gallbladder Current alcohol  use: Yes Last AFP 10/23/22 - <1.8 MELD score: 10/23/22 - 8 Vaccination status for hepatitis A and B: never vaccinated  Last EGD: 2019 - Normal proximal esophagus and mid esophagus. - Grade I esophageal varices. - Z-line irregular, 43 cm from the incisors. - Portal hypertensive gastropathy. - Two gastric polyps. Resected and retrieved. Clip (MR conditional) was placed. - A single gastric polyp. Resected and retrieved. Path hyperplastic polyp with granulation tissue. - Normal duodenal bulb and second portion of the duodenum.   Last Colonoscopy: 2018 - Diverticulosis in the sigmoid colon and in the cecum. - Internal hemorrhoids  Past Medical History: Past Medical History:  Diagnosis Date   Aneurysm (HCC)    right femoral pseudoaneurysm, thrombosed (following 03/20/07 cardiac cath)   CAD, multiple vessel    PCI circumflex 2008, diffuse disease of the right and LAD treated medically.,  //   Nuclear 2010 no ischemia   //   nuclear May, 2012 no ischemia   CHF (congestive heart failure) (HCC)    Cirrhosis of liver without mention of alcohol     Contrast media allergy    pt states he is not allergy to contrast   Diabetes mellitus without complication (HCC)    type 2   Diverticulosis    Dysphagia  Ejection fraction    EF 55%, nuclear, May, 2012   Esophageal reflux    IBS (irritable bowel syndrome)    Lower abdominal pain    Mixed dyslipidemia    Morbid obesity (HCC)    Other chronic nonalcoholic liver disease    Postsurgical percutaneous transluminal coronary angioplasty status    Preop  cardiovascular exam    Cardiovascular clearance for prostate surgery October, 2013   Prostate cancer (HCC) 03/24/2012   Renal insufficiency    Unspecified diastolic heart failure    Unspecified essential hypertension     Past Surgical History: Past Surgical History:  Procedure Laterality Date   BACK SURGERY     took cyst out of back   CARDIAC CATHETERIZATION     CARPAL TUNNEL RELEASE Bilateral    CHOLECYSTECTOMY  06/1998   COLONOSCOPY  03/03/03   COLONOSCOPY  08/17/2011   Procedure: COLONOSCOPY;  Surgeon: Claudis RAYMOND Rivet, MD;  [facility];  Service: Endoscopy;  Laterality: N/A;  1045   COLONOSCOPY N/A 07/05/2017   Procedure: COLONOSCOPY;  Surgeon: Rivet Claudis RAYMOND, MD;  Location: [facility];  Service: Endoscopy;  Laterality: N/A;  2:00   CORONARY IMAGING/OCT N/A 05/03/2022   Procedure: INTRAVASCULAR IMAGING/OCT;  Surgeon: Mady Bruckner, MD;  [facility]: [facility];  Service: Cardiovascular;  Laterality: N/A;   CORONARY PRESSURE/FFR WITH 3D MAPPING N/A 05/03/2022   Procedure: Coronary Pressure Wire/FFR w/3D Mapping;  Surgeon: Mady Bruckner, MD;  Location: [facility];  Service: Cardiovascular;  Laterality: N/A;   CORONARY STENT INTERVENTION N/A 05/03/2022   Procedure: CORONARY STENT INTERVENTION;  Surgeon: Mady Bruckner, MD;  [facility]: [facility];  Service: Cardiovascular;  Laterality: N/A;   CORONARY STENT INTERVENTION N/A 02/18/2023   Procedure: CORONARY STENT INTERVENTION;  Surgeon: Burnard Debby LABOR, MD;  [facility]: [facility];  Service: Cardiovascular;  Laterality: N/A;   CORONARY STENT INTERVENTION N/A 02/18/2023   Procedure: CORONARY STENT INTERVENTION;  Surgeon: Wonda Sharper, MD;  Location: [facility] INVASIVE CV LAB;  Service: Cardiovascular;  Laterality: N/A;   ESOPHAGOGASTRODUODENOSCOPY (EGD) WITH PROPOFOL  N/A 12/20/2017   Procedure: ESOPHAGOGASTRODUODENOSCOPY (EGD) WITH PROPOFOL ;  Surgeon: Rivet Claudis RAYMOND, MD;  [facility]: [facility];   Service: Endoscopy;  Laterality: N/A;  11:10 :   LEFT HEART CATH AND CORONARY ANGIOGRAPHY N/A 05/03/2022   Procedure: LEFT HEART CATH AND CORONARY ANGIOGRAPHY;  Surgeon: Mady Bruckner, MD;  [facility]: [facility];  Service: Cardiovascular;  Laterality: N/A;   LEFT HEART CATH AND CORONARY ANGIOGRAPHY N/A 02/18/2023   Procedure: LEFT HEART CATH AND CORONARY ANGIOGRAPHY;  Surgeon: Burnard Debby LABOR, MD;  Location: [facility];  Service: Cardiovascular;  Laterality: N/A;   LEFT HEART CATHETERIZATION WITH CORONARY ANGIOGRAM N/A 12/07/2013   Procedure: LEFT HEART CATHETERIZATION WITH CORONARY ANGIOGRAM;  Surgeon: Peter M Swaziland, MD;  Location: [facility] CATH LAB;  Service: Cardiovascular;  Laterality: N/A;   left rotator     POLYPECTOMY  12/20/2017   Procedure: POLYPECTOMY;  Surgeon: Rivet Claudis RAYMOND, MD;  Location: [facility];  Service: Endoscopy;;  Gastric (HS) x2   RIGHT KNEE SURGERY     ROBOT ASSISTED LAPAROSCOPIC RADICAL PROSTATECTOMY  06/26/2012   Procedure: ROBOTIC ASSISTED LAPAROSCOPIC RADICAL PROSTATECTOMY LEVEL 3;  Surgeon: Noretta Ferrara, MD;  Location: [facility];  Service: Urology;  Laterality: N/A;       Two cardiac stents  2009   UPPER GASTROINTESTINAL ENDOSCOPY  02/21/2011   EGD ED   UPPER GASTROINTESTINAL ENDOSCOPY  03/03/03   TCS   VASECTOMY      Family History: Family History  Problem Relation Age of Onset   Heart disease Mother    Breast cancer Mother    Heart disease Father    Breast cancer Sister    Ovarian cancer Sister    Heart disease Brother    Healthy Daughter    Healthy Daughter    Healthy Daughter    Healthy Daughter    Coronary artery disease Other        family history    Social History: Social History   Tobacco Use  Smoking Status Former   Current packs/day: 0.00   Average packs/day: 3.0 packs/day for 30.0 years (90.0 ttl pk-yrs)   Types: Cigarettes   Start date: 08/27/1968   Quit date: 08/27/1998   Years since quitting: 25.5   Passive exposure:  Past  Smokeless Tobacco Never  Tobacco Comments   Pt quit smoking 10 yrs ago, smoked for about 30 yrs.   Social History   Substance and Sexual Activity  Alcohol  Use No   Alcohol /week: 0.0 standard drinks of alcohol    Social History   Substance and Sexual Activity  Drug Use No    Allergies: Allergies  Allergen Reactions   Apresoline  [Hydralazine ] Shortness Of Breath   Codeine Palpitations   Plavix [Clopidogrel Bisulfate] Rash    Medications: Current Outpatient Medications  Medication Sig Dispense Refill   amLODipine  (NORVASC ) 5 MG tablet TAKE 2 TABLETS BY MOUTH DAILY 200 tablet 2   ascorbic acid (VITAMIN C) 500 MG tablet Take 500 mg by mouth daily.     aspirin  EC 81 MG tablet Take 1 tablet (81 mg total) by mouth daily.     carvedilol  (COREG ) 25 MG tablet Take 1 tablet (25 mg total) by mouth 2 (two) times daily with a meal. 180 tablet 3   empagliflozin  (JARDIANCE ) 10 MG TABS tablet Take 1 tablet by mouth once daily 90 tablet 3   insulin  glargine (LANTUS ) 100 UNIT/ML Solostar Pen Inject 40-50 Units into the skin See admin instructions. 40 units in the morning, 50 units at bedtime. (Patient taking differently: Inject 40-50 Units into the skin See admin instructions. 50 units in the morning, 50 units at bedtime.)     isosorbide  mononitrate (IMDUR ) 30 MG 24 hr tablet Take 1 tablet (30 mg total) by mouth daily. 90 tablet 1   lisinopril  (PRINIVIL ,ZESTRIL ) 40 MG tablet Take 40 mg by mouth every morning.      Multiple Vitamin (MULTI VITAMIN MENS PO) Take 1 tablet by mouth daily.      nitroGLYCERIN  (NITROSTAT ) 0.4 MG SL tablet Place 1 tablet (0.4 mg total) under the tongue every 5 (five) minutes x 3 doses as needed for chest pain (if no relief after 2nd dose, proceed to ED or call 911). For chest pains 25 tablet 3   pantoprazole  (PROTONIX ) 40 MG tablet Take 40 mg by mouth daily.     Tirzepatide (MOUNJARO ) Inject into the skin daily at 6 (six) AM. Before meals per patient.      spironolactone  (ALDACTONE ) 25 MG tablet Take 1 tablet (25 mg total) by mouth daily. (Patient not taking: Reported on 02/27/2024) 90 tablet 2   No current facility-administered medications for this visit.    Review of Systems: GENERAL: negative for malaise, night sweats HEENT: No changes in hearing or vision, no nose bleeds or other nasal problems. NECK: Negative for lumps, goiter, pain and significant neck swelling RESPIRATORY: Negative for  cough, wheezing CARDIOVASCULAR: Negative for chest pain, leg swelling, palpitations, orthopnea GI: SEE HPI MUSCULOSKELETAL: Negative for joint pain or swelling, back pain, and muscle pain. SKIN: Negative for lesions, rash PSYCH: Negative for sleep disturbance, mood disorder and recent psychosocial stressors. HEMATOLOGY Negative for prolonged bleeding, bruising easily, and swollen nodes. ENDOCRINE: Negative for cold or heat intolerance, polyuria, polydipsia and goiter. NEURO: negative for tremor, gait imbalance, syncope and seizures. The remainder of the review of systems is noncontributory.   Physical Exam: BP (!) 154/77 (BP Location: Left Arm, Patient Position: Sitting, Cuff Size: Large)   Pulse 69   Temp 98.1 F (36.7 C) (Temporal)   Ht 6' 2 (1.88 m)   Wt (!) 303 lb 3.2 oz (137.5 kg)   BMI 38.93 kg/m  GENERAL: The patient is AO x3, in no acute distress.  Uses a cane. HEENT: Head is normocephalic and atraumatic. EOMI are intact. Mouth is well hydrated and without lesions. NECK: Supple. No masses LUNGS: Clear to auscultation. No presence of rhonchi/wheezing/rales. Adequate chest expansion HEART: RRR, normal s1 and s2. ABDOMEN: Soft, nontender, no guarding, no peritoneal signs, and nondistended. BS +. No masses. EXTREMITIES: Without any cyanosis, clubbing, rash, lesions or edema. NEUROLOGIC: AOx3, no focal motor deficit. SKIN: no jaundice, no rashes  Imaging/Labs: as above  I personally reviewed and interpreted the available labs, imaging  and endoscopic files.  Impression and Plan: Billy Davis is a 68 y.o. male with past medical history of NASH cirrhosis, prostate cancer, non bleeding G1 EV, CAD, DM, GERD, HLD, obesity, HTN , who presents for follow up of cirrhosis, GERD and IBS.  Patient has presented history of compensated liver cirrhosis with evidence of portal hypertension as he had evidence of esophageal varices in prior examinations.  Even though he unfortunately presented myocardial infarction last year, he was placed on Coreg  since then and has tolerated adequately, which will work as variceal bleeding prevention.  He has not presented any signs of hepatic encephalopathy and ascites.  He is due for [facility] screening, AFP and ultrasound will be ordered today.  Blood workup for surveillance of cirrhosis will be ordered as well.  He is endorsing issues with throat clearing, which appears to be more extraintestinal in nature but we will evaluate this further with an EGD.  This will also help us  evaluate how large his varices are.  He is not presenting any typical GERD symptoms at the moment while on PPI daily, he should continue his same dose of pantoprazole  40 mg every day.  In terms of his rectal bleeding, he has presented intermittent occasional episodes of rectal bleeding.  I suspect these are related to benign and rectal disease but we will evaluate this further with a colonoscopy.  - Schedule EGD and colonoscopy - Check CBC, MELD labs and AFP - Schedule liver US  - Reduce salt intake to <2 g per day - do not add salt to food and avoid processed meals. - Schedule EGD - Can take Tylenol  max of 2 g per day (650 mg q8h) for pain - Avoid NSAIDs for pain - Avoid eating raw oysters/shellfish - Protein shake (Ensure or Boost) every night before going to sleep - Continue pantoprazole  40 mg daily -Continue Coreg  25 mg twice a day -If persistent bleeding, may start MiraLAX daily to keep stools soft to watery. -Follow-up closely  with PCP regarding other causes of throat clearing including postnasal drip.  All questions were answered.      Toribio Fortune, MD Gastroenterology  and Hepatology [facility] Gastroenterology

## 2024-02-27 NOTE — H&P (View-Only) (Signed)
 Toribio Fortune, M.D. Gastroenterology & Hepatology [facility] Gastroenterology [address] [place], [place] [address]  Primary Care Physician: Maree Isles, MD [address] [place] [address]  I will communicate my assessment and recommendations to the referring MD via EMR.  Problems: NASH cirrhosis GERD Grade I EV IBS  History of Present Illness: Billy Davis is a 68 y.o. male with past medical history of NASH cirrhosis, prostate cancer, non bleeding G1 EV, CAD, DM, GERD, HLD, obesity, HTN , who presents for follow up of cirrhosis, GERD and IBS.  The patient was last seen on 10/23/2022. At that time, the patient was scheduled for EGD but he did not proceed with this (he has canceled at least 3 times).  At that visit, the patient requested to have a colonoscopy performed for screening purposes but he was not due for repeat and this was not ordered.  Patient reports in June 2024 he had a heart attack and had two stents were placed. He was put on aspirin  and then another antiplatelet. He reports that the antiplatelet was discontinued around December. He has been on Coreg  25 mg BID.  Patient reports that he has presented issues with rectal bleeding for the last year intermittently. He states he is having this every other month, small amount. He is moving his bowels 2-3  times per day without any straining.   Also endorses some lower abdominal pain intermittently for the last 3-4 months.  He also reports that he has to constantly clearing his throat for the last 3-4 months. No heartburn, odynophagia, dysphagia. He takes Protonix  40 mg qday.  The patient denies having any nausea, vomiting, fever, chills, hematochezia, melena, hematemesis, abdominal distention, abdominal pain, diarrhea, jaundice, pruritus or weight loss.  Cirrhosis related questions: Hematemesis/coffee ground emesis: No History of variceal bleeding: No Abdominal pain: Yes Abdominal  distention/worsening ascites: No Fever/chills: No Episodes of confusion/disorientation: No Taking diuretics?: was prescribed spironolactone  25 mg qday but he is not taking this as it led to urinary incontinence Prior history of banding?: No Prior episodes of SBP: No Last time liver imaging was performed: 11/01/2022, ultrasound did not show any mass in the liver, absent gallbladder Current alcohol  use: Yes Last AFP 10/23/22 - <1.8 MELD score: 10/23/22 - 8 Vaccination status for hepatitis A and B: never vaccinated  Last EGD: 2019 - Normal proximal esophagus and mid esophagus. - Grade I esophageal varices. - Z-line irregular, 43 cm from the incisors. - Portal hypertensive gastropathy. - Two gastric polyps. Resected and retrieved. Clip (MR conditional) was placed. - A single gastric polyp. Resected and retrieved. Path hyperplastic polyp with granulation tissue. - Normal duodenal bulb and second portion of the duodenum.   Last Colonoscopy: 2018 - Diverticulosis in the sigmoid colon and in the cecum. - Internal hemorrhoids  Past Medical History: Past Medical History:  Diagnosis Date   Aneurysm (HCC)    right femoral pseudoaneurysm, thrombosed (following 03/20/07 cardiac cath)   CAD, multiple vessel    PCI circumflex 2008, diffuse disease of the right and LAD treated medically.,  //   Nuclear 2010 no ischemia   //   nuclear May, 2012 no ischemia   CHF (congestive heart failure) (HCC)    Cirrhosis of liver without mention of alcohol     Contrast media allergy    pt states he is not allergy to contrast   Diabetes mellitus without complication (HCC)    type 2   Diverticulosis    Dysphagia  Ejection fraction    EF 55%, nuclear, May, 2012   Esophageal reflux    IBS (irritable bowel syndrome)    Lower abdominal pain    Mixed dyslipidemia    Morbid obesity (HCC)    Other chronic nonalcoholic liver disease    Postsurgical percutaneous transluminal coronary angioplasty status    Preop  cardiovascular exam    Cardiovascular clearance for prostate surgery October, 2013   Prostate cancer (HCC) 03/24/2012   Renal insufficiency    Unspecified diastolic heart failure    Unspecified essential hypertension     Past Surgical History: Past Surgical History:  Procedure Laterality Date   BACK SURGERY     took cyst out of back   CARDIAC CATHETERIZATION     CARPAL TUNNEL RELEASE Bilateral    CHOLECYSTECTOMY  06/1998   COLONOSCOPY  03/03/03   COLONOSCOPY  08/17/2011   Procedure: COLONOSCOPY;  Surgeon: Claudis RAYMOND Rivet, MD;  [facility]: [facility];  Service: Endoscopy;  Laterality: N/A;  1045   COLONOSCOPY N/A 07/05/2017   Procedure: COLONOSCOPY;  Surgeon: Rivet Claudis RAYMOND, MD;  Location: [facility];  Service: Endoscopy;  Laterality: N/A;  2:00   CORONARY IMAGING/OCT N/A 05/03/2022   Procedure: INTRAVASCULAR IMAGING/OCT;  Surgeon: Mady Bruckner, MD;  [facility]: [facility];  Service: Cardiovascular;  Laterality: N/A;   CORONARY PRESSURE/FFR WITH 3D MAPPING N/A 05/03/2022   Procedure: Coronary Pressure Wire/FFR w/3D Mapping;  Surgeon: Mady Bruckner, MD;  Location: [facility];  Service: Cardiovascular;  Laterality: N/A;   CORONARY STENT INTERVENTION N/A 05/03/2022   Procedure: CORONARY STENT INTERVENTION;  Surgeon: Mady Bruckner, MD;  [facility]: [facility];  Service: Cardiovascular;  Laterality: N/A;   CORONARY STENT INTERVENTION N/A 02/18/2023   Procedure: CORONARY STENT INTERVENTION;  Surgeon: Burnard Debby LABOR, MD;  Location: [facility];  Service: Cardiovascular;  Laterality: N/A;   CORONARY STENT INTERVENTION N/A 02/18/2023   Procedure: CORONARY STENT INTERVENTION;  Surgeon: Wonda Sharper, MD;  Location: [facility] INVASIVE CV LAB;  Service: Cardiovascular;  Laterality: N/A;   ESOPHAGOGASTRODUODENOSCOPY (EGD) WITH PROPOFOL  N/A 12/20/2017   Procedure: ESOPHAGOGASTRODUODENOSCOPY (EGD) WITH PROPOFOL ;  Surgeon: Rivet Claudis RAYMOND, MD;  [facility]: [facility];   Service: Endoscopy;  Laterality: N/A;  11:10 :   LEFT HEART CATH AND CORONARY ANGIOGRAPHY N/A 05/03/2022   Procedure: LEFT HEART CATH AND CORONARY ANGIOGRAPHY;  Surgeon: Mady Bruckner, MD;  [facility]: [facility];  Service: Cardiovascular;  Laterality: N/A;   LEFT HEART CATH AND CORONARY ANGIOGRAPHY N/A 02/18/2023   Procedure: LEFT HEART CATH AND CORONARY ANGIOGRAPHY;  Surgeon: Burnard Debby LABOR, MD;  Location: [facility];  Service: Cardiovascular;  Laterality: N/A;   LEFT HEART CATHETERIZATION WITH CORONARY ANGIOGRAM N/A 12/07/2013   Procedure: LEFT HEART CATHETERIZATION WITH CORONARY ANGIOGRAM;  Surgeon: Peter M Swaziland, MD;  Location: [facility] CATH LAB;  Service: Cardiovascular;  Laterality: N/A;   left rotator     POLYPECTOMY  12/20/2017   Procedure: POLYPECTOMY;  Surgeon: Rivet Claudis RAYMOND, MD;  [facility]: [facility];  Service: Endoscopy;;  Gastric (HS) x2   RIGHT KNEE SURGERY     ROBOT ASSISTED LAPAROSCOPIC RADICAL PROSTATECTOMY  06/26/2012   Procedure: ROBOTIC ASSISTED LAPAROSCOPIC RADICAL PROSTATECTOMY LEVEL 3;  Surgeon: Noretta Ferrara, MD;  Location: [facility];  Service: Urology;  Laterality: N/A;       Two cardiac stents  2009   UPPER GASTROINTESTINAL ENDOSCOPY  02/21/2011   EGD ED   UPPER GASTROINTESTINAL ENDOSCOPY  03/03/03   TCS   VASECTOMY      Family History: Family History  Problem Relation Age of Onset   Heart disease Mother    Breast cancer Mother    Heart disease Father    Breast cancer Sister    Ovarian cancer Sister    Heart disease Brother    Healthy Daughter    Healthy Daughter    Healthy Daughter    Healthy Daughter    Coronary artery disease Other        family history    Social History: Social History   Tobacco Use  Smoking Status Former   Current packs/day: 0.00   Average packs/day: 3.0 packs/day for 30.0 years (90.0 ttl pk-yrs)   Types: Cigarettes   Start date: 08/27/1968   Quit date: 08/27/1998   Years since quitting: 25.5   Passive exposure:  Past  Smokeless Tobacco Never  Tobacco Comments   Pt quit smoking 10 yrs ago, smoked for about 30 yrs.   Social History   Substance and Sexual Activity  Alcohol  Use No   Alcohol /week: 0.0 standard drinks of alcohol    Social History   Substance and Sexual Activity  Drug Use No    Allergies: Allergies  Allergen Reactions   Apresoline  [Hydralazine ] Shortness Of Breath   Codeine Palpitations   Plavix [Clopidogrel Bisulfate] Rash    Medications: Current Outpatient Medications  Medication Sig Dispense Refill   amLODipine  (NORVASC ) 5 MG tablet TAKE 2 TABLETS BY MOUTH DAILY 200 tablet 2   ascorbic acid (VITAMIN C) 500 MG tablet Take 500 mg by mouth daily.     aspirin  EC 81 MG tablet Take 1 tablet (81 mg total) by mouth daily.     carvedilol  (COREG ) 25 MG tablet Take 1 tablet (25 mg total) by mouth 2 (two) times daily with a meal. 180 tablet 3   empagliflozin  (JARDIANCE ) 10 MG TABS tablet Take 1 tablet by mouth once daily 90 tablet 3   insulin  glargine (LANTUS ) 100 UNIT/ML Solostar Pen Inject 40-50 Units into the skin See admin instructions. 40 units in the morning, 50 units at bedtime. (Patient taking differently: Inject 40-50 Units into the skin See admin instructions. 50 units in the morning, 50 units at bedtime.)     isosorbide  mononitrate (IMDUR ) 30 MG 24 hr tablet Take 1 tablet (30 mg total) by mouth daily. 90 tablet 1   lisinopril  (PRINIVIL ,ZESTRIL ) 40 MG tablet Take 40 mg by mouth every morning.      Multiple Vitamin (MULTI VITAMIN MENS PO) Take 1 tablet by mouth daily.      nitroGLYCERIN  (NITROSTAT ) 0.4 MG SL tablet Place 1 tablet (0.4 mg total) under the tongue every 5 (five) minutes x 3 doses as needed for chest pain (if no relief after 2nd dose, proceed to ED or call 911). For chest pains 25 tablet 3   pantoprazole  (PROTONIX ) 40 MG tablet Take 40 mg by mouth daily.     Tirzepatide (MOUNJARO ) Inject into the skin daily at 6 (six) AM. Before meals per patient.      spironolactone  (ALDACTONE ) 25 MG tablet Take 1 tablet (25 mg total) by mouth daily. (Patient not taking: Reported on 02/27/2024) 90 tablet 2   No current facility-administered medications for this visit.    Review of Systems: GENERAL: negative for malaise, night sweats HEENT: No changes in hearing or vision, no nose bleeds or other nasal problems. NECK: Negative for lumps, goiter, pain and significant neck swelling RESPIRATORY: Negative for  cough, wheezing CARDIOVASCULAR: Negative for chest pain, leg swelling, palpitations, orthopnea GI: SEE HPI MUSCULOSKELETAL: Negative for joint pain or swelling, back pain, and muscle pain. SKIN: Negative for lesions, rash PSYCH: Negative for sleep disturbance, mood disorder and recent psychosocial stressors. HEMATOLOGY Negative for prolonged bleeding, bruising easily, and swollen nodes. ENDOCRINE: Negative for cold or heat intolerance, polyuria, polydipsia and goiter. NEURO: negative for tremor, gait imbalance, syncope and seizures. The remainder of the review of systems is noncontributory.   Physical Exam: BP (!) 154/77 (BP Location: Left Arm, Patient Position: Sitting, Cuff Size: Large)   Pulse 69   Temp 98.1 F (36.7 C) (Temporal)   Ht 6' 2 (1.88 m)   Wt (!) 303 lb 3.2 oz (137.5 kg)   BMI 38.93 kg/m  GENERAL: The patient is AO x3, in no acute distress.  Uses a cane. HEENT: Head is normocephalic and atraumatic. EOMI are intact. Mouth is well hydrated and without lesions. NECK: Supple. No masses LUNGS: Clear to auscultation. No presence of rhonchi/wheezing/rales. Adequate chest expansion HEART: RRR, normal s1 and s2. ABDOMEN: Soft, nontender, no guarding, no peritoneal signs, and nondistended. BS +. No masses. EXTREMITIES: Without any cyanosis, clubbing, rash, lesions or edema. NEUROLOGIC: AOx3, no focal motor deficit. SKIN: no jaundice, no rashes  Imaging/Labs: as above  I personally reviewed and interpreted the available labs, imaging  and endoscopic files.  Impression and Plan: Billy Davis is a 68 y.o. male with past medical history of NASH cirrhosis, prostate cancer, non bleeding G1 EV, CAD, DM, GERD, HLD, obesity, HTN , who presents for follow up of cirrhosis, GERD and IBS.  Patient has presented history of compensated liver cirrhosis with evidence of portal hypertension as he had evidence of esophageal varices in prior examinations.  Even though he unfortunately presented myocardial infarction last year, he was placed on Coreg  since then and has tolerated adequately, which will work as variceal bleeding prevention.  He has not presented any signs of hepatic encephalopathy and ascites.  He is due for [facility] screening, AFP and ultrasound will be ordered today.  Blood workup for surveillance of cirrhosis will be ordered as well.  He is endorsing issues with throat clearing, which appears to be more extraintestinal in nature but we will evaluate this further with an EGD.  This will also help us  evaluate how large his varices are.  He is not presenting any typical GERD symptoms at the moment while on PPI daily, he should continue his same dose of pantoprazole  40 mg every day.  In terms of his rectal bleeding, he has presented intermittent occasional episodes of rectal bleeding.  I suspect these are related to benign and rectal disease but we will evaluate this further with a colonoscopy.  - Schedule EGD and colonoscopy - Check CBC, MELD labs and AFP - Schedule liver US  - Reduce salt intake to <2 g per day - do not add salt to food and avoid processed meals. - Schedule EGD - Can take Tylenol  max of 2 g per day (650 mg q8h) for pain - Avoid NSAIDs for pain - Avoid eating raw oysters/shellfish - Protein shake (Ensure or Boost) every night before going to sleep - Continue pantoprazole  40 mg daily -Continue Coreg  25 mg twice a day -If persistent bleeding, may start MiraLAX daily to keep stools soft to watery. -Follow-up closely  with PCP regarding other causes of throat clearing including postnasal drip.  All questions were answered.      Toribio Fortune, MD Gastroenterology  and Hepatology [facility] Gastroenterology

## 2024-02-28 LAB — COMPREHENSIVE METABOLIC PANEL WITH GFR
ALT: 20 IU/L (ref 0–44)
AST: 21 IU/L (ref 0–40)
Albumin: 4.1 g/dL (ref 3.9–4.9)
Alkaline Phosphatase: 72 IU/L (ref 44–121)
BUN/Creatinine Ratio: 16 (ref 10–24)
BUN: 13 mg/dL (ref 8–27)
Bilirubin Total: 0.6 mg/dL (ref 0.0–1.2)
CO2: 24 mmol/L (ref 20–29)
Calcium: 9.1 mg/dL (ref 8.6–10.2)
Chloride: 100 mmol/L (ref 96–106)
Creatinine, Ser: 0.81 mg/dL (ref 0.76–1.27)
Globulin, Total: 2.3 g/dL (ref 1.5–4.5)
Glucose: 254 mg/dL — ABNORMAL HIGH (ref 70–99)
Potassium: 4.1 mmol/L (ref 3.5–5.2)
Sodium: 137 mmol/L (ref 134–144)
Total Protein: 6.4 g/dL (ref 6.0–8.5)
eGFR: 97 mL/min/1.73 (ref >59)

## 2024-02-28 LAB — CBC/DIFF AMBIGUOUS DEFAULT
Basophils Absolute: 0.1 x10E3/uL (ref 0.0–0.2)
Basos: 1 %
EOS (ABSOLUTE): 0.3 x10E3/uL (ref 0.0–0.4)
Eos: 6 %
Hematocrit: 42.4 % (ref 37.5–51.0)
Hemoglobin: 14.1 g/dL (ref 13.0–17.7)
Immature Grans (Abs): 0 x10E3/uL (ref 0.0–0.1)
Immature Granulocytes: 0 %
Lymphocytes Absolute: 1.4 x10E3/uL (ref 0.7–3.1)
Lymphs: 24 %
MCH: 28.3 pg (ref 26.6–33.0)
MCHC: 33.3 g/dL (ref 31.5–35.7)
MCV: 85 fL (ref 79–97)
Monocytes Absolute: 0.4 x10E3/uL (ref 0.1–0.9)
Monocytes: 7 %
Neutrophils Absolute: 3.6 x10E3/uL (ref 1.4–7.0)
Neutrophils: 62 %
Platelets: 131 x10E3/uL — ABNORMAL LOW (ref 150–450)
RBC: 4.99 x10E6/uL (ref 4.14–5.80)
RDW: 13.5 % (ref 11.6–15.4)
WBC: 5.8 x10E3/uL (ref 3.4–10.8)

## 2024-02-28 LAB — HEPATITIS B SURFACE ANTIBODY,QUALITATIVE: Hep B Surface Ab, Qual: NONREACTIVE

## 2024-02-28 LAB — SPECIMEN STATUS REPORT

## 2024-02-28 LAB — PROTIME-INR
INR: 1.1 (ref 0.9–1.2)
Prothrombin Time: 12.2 s — ABNORMAL HIGH (ref 9.1–12.0)

## 2024-02-28 LAB — HEPATITIS A ANTIBODY, TOTAL: hep A Total Ab: NEGATIVE

## 2024-02-28 LAB — AFP TUMOR MARKER: AFP, Serum, Tumor Marker: <1.8 ng/mL (ref 0.0–8.4)

## 2024-03-01 ENCOUNTER — Ambulatory Visit (INDEPENDENT_AMBULATORY_CARE_PROVIDER_SITE_OTHER): Payer: Self-pay | Admitting: Gastroenterology

## 2024-03-03 ENCOUNTER — Other Ambulatory Visit: Payer: Self-pay | Admitting: *Deleted

## 2024-03-03 ENCOUNTER — Telehealth: Payer: Self-pay | Admitting: *Deleted

## 2024-03-03 ENCOUNTER — Encounter: Payer: Self-pay | Admitting: *Deleted

## 2024-03-03 MED ORDER — PEG 3350-KCL-NA BICARB-NACL 420 G PO SOLR: 4000.0000 mL | Freq: Once | ORAL | 0 refills | Status: AC

## 2024-03-03 NOTE — Telephone Encounter (Signed)
 Pt informed of pre-op appointment on Wednesday 03/11/24 at 1:15 pm

## 2024-03-05 ENCOUNTER — Ambulatory Visit: Payer: Medicare Other | Admitting: Nurse Practitioner

## 2024-03-10 NOTE — Patient Instructions (Signed)
 Billy Davis  03/10/2024     @PREFPERIOPPHARMACY @   Your procedure is scheduled on  03/13/2024.    Report to Billy Davis at  0745 A.M.   Call this number if you have problems the morning of surgery:  [phone]  If you experience any cold or flu symptoms such as cough, fever, chills, shortness of breath, etc. between now and your scheduled surgery, please notify us  at the above number.   Remember:        Your last dose of jardiance  should have been on 03/09/2024.        If your pat is daily, the last dose should have been on 03/09/2024. If it is weekly, it should have been on 03/05/2024.        Take 1/2 of your usual dose of insulin  the night before your procedure.          DO NOT take any medications for diabetes the morning of your procedure.   Follow the diet and prep instructions given to you by the office.    You may drink clear liquids until  0545 am on 03/13/2024.   Clear liquids allowed are:                    Water , Juice (No red color; non-citric and without pulp; diabetics please choose diet or no sugar options), Carbonated beverages (diabetics please choose diet or no sugar options), Clear Tea (No creamer, milk, or cream, including half & half and powdered creamer), Black Coffee Only (No creamer, milk or cream, including half & half and powdered creamer), and Clear Sports drink (No red color; diabetics please choose diet or no sugar options)    Take these medicines the morning of surgery with A SIP OF WATER                     amlodipine , carvedilol , isosorbide , pantoprazole .    Do not wear jewelry, make-up or nail polish, including gel polish,  artificial nails, or any other type of covering on natural nails (fingers and  toes).  Do not wear lotions, powders, or perfumes, or deodorant.  Do not shave 48 hours prior to surgery.  Men may shave face and neck.  Do not bring valuables to the hospital.  [facility] is not responsible for any belongings  or valuables.  Contacts, dentures or bridgework may not be worn into surgery.  Leave your suitcase in the car.  After surgery it may be brought to your room.  For patients admitted to the hospital, discharge time will be determined by your treatment team.  Patients discharged the day of surgery will not be allowed to drive home and must have someone with them for 24 hours.    Special instructions:   DO NOT smoke tobacco or vape for 24 hours before your procedure.  Please read over the following fact sheets that you were given. Anesthesia Post-op Instructions and Care and Recovery After Surgery      Upper Endoscopy, Adult, Care After After the procedure, it is common to have a sore throat. It is also common to have: Mild stomach pain or discomfort. Bloating. Nausea. Follow these instructions at home: The instructions below may help you care for yourself at home. Your health care provider may give you more instructions. If you have questions, ask your health care provider. If you were given a sedative during the procedure, it can affect you for  several hours. Do not drive or operate machinery until your health care provider says that it is safe. If you will be going home right after the procedure, plan to have a responsible adult: Take you home from the hospital or clinic. You will not be allowed to drive. Care for you for the time you are told. Follow instructions from your health care provider about what you may eat and drink. Return to your normal activities as told by your health care provider. Ask your health care provider what activities are safe for you. Take over-the-counter and prescription medicines only as told by your health care provider. Contact a health care provider if you: Have a sore throat that lasts longer than one day. Have trouble swallowing. Have a fever. Get help right away if you: Vomit blood or your vomit looks like coffee grounds. Have bloody, black, or  tarry stools. Have a very bad sore throat or you cannot swallow. Have difficulty breathing or very bad pain in your chest or abdomen. These symptoms may be an emergency. Get help right away. Call 911. Do not wait to see if the symptoms will go away. Do not drive yourself to the hospital. Summary After the procedure, it is common to have a sore throat, mild stomach discomfort, bloating, and nausea. If you were given a sedative during the procedure, it can affect you for several hours. Do not drive until your health care provider says that it is safe. Follow instructions from your health care provider about what you may eat and drink. Return to your normal activities as told by your health care provider. This information is not intended to replace advice given to you by your health care provider. Make sure you discuss any questions you have with your health care provider. Document Revised: 11/22/2021 Document Reviewed: 11/22/2021 Elsevier Patient Education  2024 [employer]Colonoscopy, Adult, Care After The following information offers guidance on how to care for yourself after your procedure. Your health care provider may also give you more specific instructions. If you have problems or questions, contact your health care provider. What can I expect after the procedure? After the procedure, it is common to have: A small amount of blood in your stool for 24 hours after the procedure. Some gas. Mild cramping or bloating of your abdomen. Follow these instructions at home: Eating and drinking  Drink enough fluid to keep your urine pale yellow. Follow instructions from your health care provider about eating or drinking restrictions. Resume your normal diet as told by your health care provider. Avoid heavy or fried foods that are hard to digest. Activity Rest as told by your health care provider. Avoid sitting for a long time without moving. Get up to take short walks every 1-2 hours. This is  important to improve blood flow and breathing. Ask for help if you feel weak or unsteady. Return to your normal activities as told by your health care provider. Ask your health care provider what activities are safe for you. Managing cramping and bloating  Try walking around when you have cramps or feel bloated. If directed, apply heat to your abdomen as told by your health care provider. Use the heat source that your health care provider recommends, such as a moist heat pack or a heating pad. Place a towel between your skin and the heat source. Leave the heat on for 20-30 minutes. Remove the heat if your skin turns bright red. This is especially important if you are unable to  feel pain, heat, or cold. You have a greater risk of getting burned. General instructions If you were given a sedative during the procedure, it can affect you for several hours. Do not drive or operate machinery until your health care provider says that it is safe. For the first 24 hours after the procedure: Do not sign important documents. Do not drink alcohol . Do your regular daily activities at a slower pace than normal. Eat soft foods that are easy to digest. Take over-the-counter and prescription medicines only as told by your health care provider. Keep all follow-up visits. This is important. Contact a health care provider if: You have blood in your stool 2-3 days after the procedure. Get help right away if: You have more than a small spotting of blood in your stool. You have large blood clots in your stool. You have swelling of your abdomen. You have nausea or vomiting. You have a fever. You have increasing pain in your abdomen that is not relieved with medicine. These symptoms may be an emergency. Get help right away. Call 911. Do not wait to see if the symptoms will go away. Do not drive yourself to the hospital. Summary After the procedure, it is common to have a small amount of blood in your stool. You  may also have mild cramping and bloating of your abdomen. If you were given a sedative during the procedure, it can affect you for several hours. Do not drive or operate machinery until your health care provider says that it is safe. Get help right away if you have a lot of blood in your stool, nausea or vomiting, a fever, or increased pain in your abdomen. This information is not intended to replace advice given to you by your health care provider. Make sure you discuss any questions you have with your health care provider. Document Revised: 09/25/2022 Document Reviewed: 04/05/2021 Elsevier Patient Education  2024 [employer].General Anesthesia, Adult, Care After The following information offers guidance on how to care for yourself after your procedure. Your health care provider may also give you more specific instructions. If you have problems or questions, contact your health care provider. What can I expect after the procedure? After the procedure, it is common for people to: Have pain or discomfort at the IV site. Have nausea or vomiting. Have a sore throat or hoarseness. Have trouble concentrating. Feel cold or chills. Feel weak, sleepy, or tired (fatigue). Have soreness and body aches. These can affect parts of the body that were not involved in surgery. Follow these instructions at home: For the time period you were told by your health care provider:  Rest. Do not participate in activities where you could fall or become injured. Do not drive or use machinery. Do not drink alcohol . Do not take sleeping pills or medicines that cause drowsiness. Do not make important decisions or sign legal documents. Do not take care of children on your own. General instructions Drink enough fluid to keep your urine pale yellow. If you have sleep apnea, surgery and certain medicines can increase your risk for breathing problems. Follow instructions from your health care provider about wearing your  sleep device: Anytime you are sleeping, including during daytime naps. While taking prescription pain medicines, sleeping medicines, or medicines that make you drowsy. Return to your normal activities as told by your health care provider. Ask your health care provider what activities are safe for you. Take over-the-counter and prescription medicines only as told by your health  care provider. Do not use any products that contain nicotine or tobacco. These products include cigarettes, chewing tobacco, and vaping devices, such as e-cigarettes. These can delay incision healing after surgery. If you need help quitting, ask your health care provider. Contact a health care provider if: You have nausea or vomiting that does not get better with medicine. You vomit every time you eat or drink. You have pain that does not get better with medicine. You cannot urinate or have bloody urine. You develop a skin rash. You have a fever. Get help right away if: You have trouble breathing. You have chest pain. You vomit blood. These symptoms may be an emergency. Get help right away. Call 911. Do not wait to see if the symptoms will go away. Do not drive yourself to the hospital. Summary After the procedure, it is common to have a sore throat, hoarseness, nausea, vomiting, or to feel weak, sleepy, or fatigue. For the time period you were told by your health care provider, do not drive or use machinery. Get help right away if you have difficulty breathing, have chest pain, or vomit blood. These symptoms may be an emergency. This information is not intended to replace advice given to you by your health care provider. Make sure you discuss any questions you have with your health care provider. Document Revised: 11/10/2021 Document Reviewed: 11/10/2021 Elsevier Patient Education  2024 [employer].

## 2024-03-11 ENCOUNTER — Encounter (HOSPITAL_COMMUNITY)
Admission: RE | Admit: 2024-03-11 | Discharge: 2024-03-11 | Disposition: A | Discharge status: Home / Self Care | Source: Ambulatory Visit | Attending: Gastroenterology | Admitting: Gastroenterology

## 2024-03-11 ENCOUNTER — Encounter (HOSPITAL_COMMUNITY): Payer: Self-pay

## 2024-03-11 ENCOUNTER — Other Ambulatory Visit: Payer: Self-pay

## 2024-03-11 DIAGNOSIS — E0800 Diabetes mellitus due to underlying condition with hyperosmolarity without nonketotic hyperglycemic-hyperosmolar coma (NKHHC): Secondary | ICD-10-CM | POA: Insufficient documentation

## 2024-03-11 DIAGNOSIS — Z01810 Encounter for preprocedural cardiovascular examination: Secondary | ICD-10-CM | POA: Insufficient documentation

## 2024-03-11 DIAGNOSIS — I5189 Other ill-defined heart diseases: Secondary | ICD-10-CM | POA: Insufficient documentation

## 2024-03-11 DIAGNOSIS — I10 Essential (primary) hypertension: Secondary | ICD-10-CM | POA: Insufficient documentation

## 2024-03-11 DIAGNOSIS — I5030 Unspecified diastolic (congestive) heart failure: Secondary | ICD-10-CM | POA: Insufficient documentation

## 2024-03-12 ENCOUNTER — Ambulatory Visit (HOSPITAL_COMMUNITY)
Admission: RE | Admit: 2024-03-12 | Discharge: 2024-03-12 | Disposition: A | Discharge status: Home / Self Care | Source: Ambulatory Visit | Attending: Gastroenterology | Admitting: Gastroenterology

## 2024-03-12 DIAGNOSIS — K7460 Unspecified cirrhosis of liver: Secondary | ICD-10-CM | POA: Diagnosis present

## 2024-03-13 ENCOUNTER — Encounter (HOSPITAL_COMMUNITY)
Admission: RE | Disposition: A | Discharge status: Home / Self Care | Payer: Self-pay | Source: Home / Self Care | Attending: Gastroenterology

## 2024-03-13 ENCOUNTER — Ambulatory Visit (INDEPENDENT_AMBULATORY_CARE_PROVIDER_SITE_OTHER): Payer: Self-pay | Admitting: Gastroenterology

## 2024-03-13 ENCOUNTER — Ambulatory Visit (HOSPITAL_BASED_OUTPATIENT_CLINIC_OR_DEPARTMENT_OTHER): Admitting: Certified Registered Nurse Anesthetist

## 2024-03-13 ENCOUNTER — Encounter (HOSPITAL_COMMUNITY): Admitting: Certified Registered Nurse Anesthetist

## 2024-03-13 ENCOUNTER — Ambulatory Visit (HOSPITAL_COMMUNITY)
Admission: RE | Admit: 2024-03-13 | Discharge: 2024-03-13 | Disposition: A | Discharge status: Home / Self Care | Attending: Gastroenterology | Admitting: Gastroenterology

## 2024-03-13 DIAGNOSIS — Z7902 Long term (current) use of antithrombotics/antiplatelets: Secondary | ICD-10-CM | POA: Insufficient documentation

## 2024-03-13 DIAGNOSIS — Z7984 Long term (current) use of oral hypoglycemic drugs: Secondary | ICD-10-CM | POA: Insufficient documentation

## 2024-03-13 DIAGNOSIS — E782 Mixed hyperlipidemia: Secondary | ICD-10-CM | POA: Insufficient documentation

## 2024-03-13 DIAGNOSIS — I25119 Atherosclerotic heart disease of native coronary artery with unspecified angina pectoris: Secondary | ICD-10-CM | POA: Diagnosis not present

## 2024-03-13 DIAGNOSIS — K5521 Angiodysplasia of colon with hemorrhage: Secondary | ICD-10-CM | POA: Insufficient documentation

## 2024-03-13 DIAGNOSIS — K219 Gastro-esophageal reflux disease without esophagitis: Secondary | ICD-10-CM | POA: Diagnosis not present

## 2024-03-13 DIAGNOSIS — K766 Portal hypertension: Secondary | ICD-10-CM | POA: Diagnosis not present

## 2024-03-13 DIAGNOSIS — Z794 Long term (current) use of insulin: Secondary | ICD-10-CM | POA: Diagnosis not present

## 2024-03-13 DIAGNOSIS — I252 Old myocardial infarction: Secondary | ICD-10-CM | POA: Insufficient documentation

## 2024-03-13 DIAGNOSIS — Z79899 Other long term (current) drug therapy: Secondary | ICD-10-CM | POA: Diagnosis not present

## 2024-03-13 DIAGNOSIS — K5520 Angiodysplasia of colon without hemorrhage: Secondary | ICD-10-CM | POA: Diagnosis not present

## 2024-03-13 DIAGNOSIS — K317 Polyp of stomach and duodenum: Secondary | ICD-10-CM

## 2024-03-13 DIAGNOSIS — K589 Irritable bowel syndrome without diarrhea: Secondary | ICD-10-CM | POA: Diagnosis not present

## 2024-03-13 DIAGNOSIS — I8510 Secondary esophageal varices without bleeding: Secondary | ICD-10-CM | POA: Insufficient documentation

## 2024-03-13 DIAGNOSIS — I2510 Atherosclerotic heart disease of native coronary artery without angina pectoris: Secondary | ICD-10-CM | POA: Insufficient documentation

## 2024-03-13 DIAGNOSIS — K3189 Other diseases of stomach and duodenum: Secondary | ICD-10-CM | POA: Insufficient documentation

## 2024-03-13 DIAGNOSIS — K648 Other hemorrhoids: Secondary | ICD-10-CM | POA: Diagnosis not present

## 2024-03-13 DIAGNOSIS — I5032 Chronic diastolic (congestive) heart failure: Secondary | ICD-10-CM | POA: Insufficient documentation

## 2024-03-13 DIAGNOSIS — K5730 Diverticulosis of large intestine without perforation or abscess without bleeding: Secondary | ICD-10-CM | POA: Insufficient documentation

## 2024-03-13 DIAGNOSIS — I8500 Esophageal varices without bleeding: Secondary | ICD-10-CM

## 2024-03-13 DIAGNOSIS — Z87891 Personal history of nicotine dependence: Secondary | ICD-10-CM | POA: Insufficient documentation

## 2024-03-13 DIAGNOSIS — K7460 Unspecified cirrhosis of liver: Secondary | ICD-10-CM | POA: Insufficient documentation

## 2024-03-13 DIAGNOSIS — E119 Type 2 diabetes mellitus without complications: Secondary | ICD-10-CM | POA: Insufficient documentation

## 2024-03-13 DIAGNOSIS — Z7982 Long term (current) use of aspirin: Secondary | ICD-10-CM | POA: Diagnosis not present

## 2024-03-13 DIAGNOSIS — I110 Hypertensive heart disease with heart failure: Secondary | ICD-10-CM | POA: Insufficient documentation

## 2024-03-13 DIAGNOSIS — K7581 Nonalcoholic steatohepatitis (NASH): Secondary | ICD-10-CM | POA: Diagnosis not present

## 2024-03-13 HISTORY — PX: COLONOSCOPY: SHX5424

## 2024-03-13 HISTORY — PX: ESOPHAGOGASTRODUODENOSCOPY: SHX5428

## 2024-03-13 LAB — GLUCOSE, CAPILLARY: Glucose-Capillary: 174 mg/dL — ABNORMAL HIGH (ref 70–99)

## 2024-03-13 LAB — HM COLONOSCOPY

## 2024-03-13 SURGERY — COLONOSCOPY
Anesthesia: General

## 2024-03-13 MED ORDER — VASOPRESSIN 20 UNIT/ML IV SOLN
INTRAVENOUS | Status: AC
  Filled 2024-03-13: qty 1

## 2024-03-13 MED ORDER — PROPOFOL 500 MG/50ML IV EMUL
INTRAVENOUS | Status: AC
  Filled 2024-03-13: qty 50

## 2024-03-13 MED ORDER — PHENYLEPHRINE 80 MCG/ML (10ML) SYRINGE FOR IV PUSH (FOR BLOOD PRESSURE SUPPORT)
PREFILLED_SYRINGE | INTRAVENOUS | Status: AC
Start: 2024-03-13 — End: 2024-03-13
  Filled 2024-03-13: qty 10

## 2024-03-13 MED ORDER — LACTATED RINGERS IV SOLN: INTRAVENOUS | Status: DC

## 2024-03-13 MED ORDER — PHENYLEPHRINE 80 MCG/ML (10ML) SYRINGE FOR IV PUSH (FOR BLOOD PRESSURE SUPPORT)
PREFILLED_SYRINGE | INTRAVENOUS | Status: DC | PRN
  Administered 2024-03-13 (×2): 160 ug via INTRAVENOUS
  Administered 2024-03-13: 80 ug via INTRAVENOUS
  Administered 2024-03-13: 160 ug via INTRAVENOUS

## 2024-03-13 MED ORDER — EPHEDRINE 5 MG/ML INJ
INTRAVENOUS | Status: AC
  Filled 2024-03-13: qty 5

## 2024-03-13 MED ORDER — EPHEDRINE SULFATE-NACL 50-0.9 MG/10ML-% IV SOSY
PREFILLED_SYRINGE | INTRAVENOUS | Status: DC | PRN
  Administered 2024-03-13 (×3): 5 mg via INTRAVENOUS

## 2024-03-13 MED ORDER — PROPOFOL 500 MG/50ML IV EMUL
INTRAVENOUS | Status: DC | PRN
  Administered 2024-03-13: 50 mg via INTRAVENOUS
  Administered 2024-03-13: 180 ug/kg/min via INTRAVENOUS

## 2024-03-13 MED ORDER — PANTOPRAZOLE SODIUM 40 MG PO TBEC: 40.0000 mg | DELAYED_RELEASE_TABLET | Freq: Two times a day (BID) | ORAL | 0 refills | Status: DC

## 2024-03-13 MED ORDER — VASOPRESSIN 20 UNIT/ML IV SOLN
INTRAVENOUS | Status: DC | PRN
  Administered 2024-03-13: .5 [IU] via INTRAVENOUS
  Administered 2024-03-13: 1 [IU] via INTRAVENOUS

## 2024-03-13 MED ORDER — STERILE WATER FOR IRRIGATION IR SOLN
Status: DC | PRN
  Administered 2024-03-13: 60 mL

## 2024-03-13 MED ORDER — SODIUM CHLORIDE (PF) 0.9 % IJ SOLN
INTRAMUSCULAR | Status: AC
  Filled 2024-03-13: qty 10

## 2024-03-13 NOTE — Transfer of Care (Signed)
 Immediate Anesthesia Transfer of Care Note  Patient: Billy Davis  Procedure(s) Performed: COLONOSCOPY EGD (ESOPHAGOGASTRODUODENOSCOPY)  Patient Location: Short Stay  Anesthesia Type:General  Level of Consciousness: oriented, drowsy, and patient cooperative  Airway & Oxygen Therapy: Patient Spontanous Breathing  Post-op Assessment: Report given to RN, Post -op Vital signs reviewed and stable, Patient moving all extremities X 4, and Patient able to stick tongue midline  Post vital signs: Reviewed and stable  Last Vitals:  Vitals Value Taken Time  BP 114/67   Temp 97.9   Pulse 77   Resp 21   SpO2 92     Last Pain:  Vitals:   03/13/24 0817  PainSc: 0-No pain         Complications: No notable events documented.

## 2024-03-13 NOTE — Op Note (Signed)
 [facility] Patient Name: Billy Davis Procedure Date: 03/13/2024 9:09 AM MRN: [id number] Date of Birth: July 27, 1956 Attending MD: Toribio Fortune , , [id number] CSN: [id number] Age: 68 Admit Type: Outpatient Procedure:                Colonoscopy Indications:              Rectal bleeding Providers:                Toribio Fortune, Olam Ada, RN, Rosina Sprague Referring MD:              Medicines:                Monitored Anesthesia Care Complications:            No immediate complications. Estimated Blood Loss:     Estimated blood loss: none. Procedure:                Pre-Anesthesia Assessment:                           - Prior to the procedure, a History and Physical                            was performed, and patient medications, allergies                            and sensitivities were reviewed. The patient's                            tolerance of previous anesthesia was reviewed.                           - The risks and benefits of the procedure and the                            sedation options and risks were discussed with the                            patient. All questions were answered and informed                            consent was obtained.                           - ASA Grade Assessment: III - A patient with severe                            systemic disease.                           After obtaining informed consent, the colonoscope                            was passed under direct vision. Throughout the                            procedure, the patient's blood pressure, pulse, and  oxygen saturations were monitored continuously. The                            [id number]-[id number] ([id number]) was introduced through the                            anus and advanced to the the terminal ileum. The                            colonoscopy was performed without difficulty. The                            patient tolerated the procedure well. The  quality                            of the bowel preparation was good. Scope In: 9:50:38 AM Scope Out: 10:00:49 AM Scope Withdrawal Time: 0 hours 8 minutes 6 seconds  Total Procedure Duration: 0 hours 10 minutes 11 seconds  Findings:      The perianal and digital rectal examinations were normal.      The terminal ileum appeared normal.      Multiple medium-sized localized angiodysplastic lesions without bleeding       were found in the transverse colon and in the ascending colon.      A few medium-mouthed diverticula were found in the sigmoid colon and       cecum.      Non-bleeding internal hemorrhoids were found during retroflexion. The       hemorrhoids were small. Impression:               - The examined portion of the ileum was normal.                           - Multiple non-bleeding colonic angiodysplastic                            lesions.                           - Diverticulosis in the sigmoid colon and in the                            cecum.                           - Non-bleeding internal hemorrhoids.                           - No specimens collected. Moderate Sedation:      Per Anesthesia Care Recommendation:           - Discharge patient to home (ambulatory).                           - Resume previous diet.                           - Repeat colonoscopy in 10 years for screening  purposes. Procedure Code(s):        --- Professional ---                           [phone], Colonoscopy, flexible; diagnostic, including                            collection of specimen(s) by brushing or washing,                            when performed (separate procedure) Diagnosis Code(s):        --- Professional ---                           K64.8, Other hemorrhoids                           K55.20, Angiodysplasia of colon without hemorrhage                           K62.5, Hemorrhage of anus and rectum                           K57.30, Diverticulosis of  large intestine without                            perforation or abscess without bleeding CPT copyright 2022 American Medical Association. All rights reserved. The codes documented in this report are preliminary and upon coder review may  be revised to meet current compliance requirements. Toribio Fortune, MD Toribio Fortune,  03/13/2024 10:04:49 AM This report has been signed electronically. Number of Addenda: 0

## 2024-03-13 NOTE — Anesthesia Preprocedure Evaluation (Addendum)
 Anesthesia Evaluation  Patient identified by MRN, date of birth, ID band Patient awake    Reviewed: Allergy & Precautions, H&P , NPO status , Patient's Chart, lab work & pertinent test results, reviewed documented beta blocker date and time   Airway Mallampati: II  TM Distance: >3 FB Neck ROM: full    Dental no notable dental hx.    Pulmonary shortness of breath, former smoker   Pulmonary exam normal breath sounds clear to auscultation       Cardiovascular Exercise Tolerance: Good hypertension, + angina  + CAD, + Past MI, + Cardiac Stents and +CHF  Normal cardiovascular exam Rhythm:regular Rate:Normal  Aneurysm. EF 50% with history of diastolic heart failure but diastolic function not mentioned on recent echo.   Stented last month!!   Neuro/Psych negative neurological ROS  negative psych ROS   GI/Hepatic ,GERD  ,,(+) Cirrhosis       varices   Endo/Other  diabetes, Type 2  Class 3 obesity  Renal/GU Renal InsufficiencyRenal disease  negative genitourinary   Musculoskeletal  (+) Arthritis , Osteoarthritis,    Abdominal   Peds  Hematology negative hematology ROS (+)   Anesthesia Other Findings Other cirrhosis of liver (HCC) Prostate cancer (HCC)  Reproductive/Obstetrics negative OB ROS                              Anesthesia Physical Anesthesia Plan  ASA: 4  Anesthesia Plan: General   Post-op Pain Management: Minimal or no pain anticipated   Induction: Intravenous  PONV Risk Score and Plan: Propofol  infusion  Airway Management Planned: Nasal Cannula and Natural Airway  Additional Equipment: None  Intra-op Plan:   Post-operative Plan:   Informed Consent: I have reviewed the patients History and Physical, chart, labs and discussed the procedure including the risks, benefits and alternatives for the proposed anesthesia with the patient or authorized representative who has  indicated his/her understanding and acceptance.     Dental Advisory Given  Plan Discussed with: CRNA  Anesthesia Plan Comments:          Anesthesia Quick Evaluation

## 2024-03-13 NOTE — Anesthesia Postprocedure Evaluation (Signed)
 Anesthesia Post Note  Patient: Billy Davis  Procedure(s) Performed: COLONOSCOPY EGD (ESOPHAGOGASTRODUODENOSCOPY)  Patient location during evaluation: Phase II Anesthesia Type: General Level of consciousness: awake and alert Pain management: pain level controlled Vital Signs Assessment: post-procedure vital signs reviewed and stable Respiratory status: spontaneous breathing, nonlabored ventilation and respiratory function stable Cardiovascular status: blood pressure returned to baseline and stable Postop Assessment: no apparent nausea or vomiting Anesthetic complications: no   There were no known notable events for this encounter.   Last Vitals:  Vitals:   03/13/24 1008 03/13/24 1022  BP: 114/67   Pulse: 72   Resp: 17   Temp: 36.6 C   SpO2: 90% 96%    Last Pain:  Vitals:   03/13/24 1022  TempSrc:   PainSc: 0-No pain                 Nain Rudd L Osbaldo Mark

## 2024-03-13 NOTE — Op Note (Signed)
 [facility] Patient Name: Billy Davis Procedure Date: 03/13/2024 9:09 AM MRN: [id number] Date of Birth: 1956/03/29 Attending MD: Toribio Fortune , , [id number] CSN: [id number] Age: 68 Admit Type: Outpatient Procedure:                Upper GI endoscopy Indications:              Follow-up of esophageal varices Providers:                Toribio Fortune, Olam Ada, RN, Rosina Sprague,                            Maryellen Saner, RN Referring MD:              Medicines:                Monitored Anesthesia Care Complications:            No immediate complications. Estimated Blood Loss:     Estimated blood loss: none. Procedure:                Pre-Anesthesia Assessment:                           - Prior to the procedure, a History and Physical                            was performed, and patient medications, allergies                            and sensitivities were reviewed. The patient's                            tolerance of previous anesthesia was reviewed.                           - The risks and benefits of the procedure and the                            sedation options and risks were discussed with the                            patient. All questions were answered and informed                            consent was obtained.                           - ASA Grade Assessment: III - A patient with severe                            systemic disease.                           After obtaining informed consent, the endoscope was                            passed under direct vision. Throughout the  procedure, the patient's blood pressure, pulse, and                            oxygen saturations were monitored continuously. The                            GIF-H190 ([id number]) scope was introduced through the                            mouth, and advanced to the second part of duodenum.                            The upper GI endoscopy was accomplished without                             difficulty. The patient tolerated the procedure                            well. Scope In: 9:25:26 AM Scope Out: 9:43:34 AM Total Procedure Duration: 0 hours 18 minutes 8 seconds  Findings:      Grade I varices were found in the distal esophagus.      A single 40 mm semi-pedunculated polyp with no bleeding was found on the       lesser curvature of the gastric antrum. The polyp was removed with a hot       snare. Resection and retrieval were complete. To prevent bleeding after       the polypectomy, four hemostatic clips were successfully placed (MR       safe). Clip manufacturer: [employer]. There was no bleeding at       the end of the procedure.      A few small semi-sessile polyps were found in the gastric body and in       the gastric antrum.      The rest of the examined stomach was normal. Biopsies were taken with a       cold forceps for Helicobacter pylori testing.      The examined duodenum was normal. Impression:               - Grade I esophageal varices.                           - A single gastric polyp. Resected and retrieved.                            Clip manufacturer: [employer]. Clips (MR                            safe) were placed.                           - A few gastric polyps.                           - Normal stomach otherwise. Biopsied.                           -  Normal examined duodenum. Moderate Sedation:      Per Anesthesia Care Recommendation:           - Discharge patient to home (ambulatory).                           - Resume previous diet.                           - Await pathology results.                           - Use Protonix  (pantoprazole ) 40 mg PO BID for 4                            weeks, then decrease back to once a day.                           - Repeat upper endoscopy in 1 year for surveillance. Procedure Code(s):        --- Professional ---                           [phone],  Esophagogastroduodenoscopy, flexible,                            transoral; with removal of tumor(s), polyp(s), or                            other lesion(s) by snare technique                           43239, 59, Esophagogastroduodenoscopy, flexible,                            transoral; with biopsy, single or multiple Diagnosis Code(s):        --- Professional ---                           I85.00, Esophageal varices without bleeding                           K31.7, Polyp of stomach and duodenum CPT copyright 2022 [employer]. All rights reserved. The codes documented in this report are preliminary and upon coder review may  be revised to meet current compliance requirements. Toribio Fortune, MD Toribio Fortune,  03/13/2024 9:49:43 AM This report has been signed electronically. Number of Addenda: 0

## 2024-03-13 NOTE — Interval H&P Note (Signed)
 History and Physical Interval Note:  03/13/2024 7:38 AM  Billy Davis  has presented today for surgery, with the diagnosis of rectal bleeding, throat clearing.  The various methods of treatment have been discussed with the patient and family. After consideration of risks, benefits and other options for treatment, the patient has consented to  Procedure(s) with comments: COLONOSCOPY (N/A) - 9:30 am, asa 3 EGD (ESOPHAGOGASTRODUODENOSCOPY) (N/A) as a surgical intervention.  The patient's history has been reviewed, patient examined, no change in status, stable for surgery.  I have reviewed the patient's chart and labs.  Questions were answered to the patient's satisfaction.     Billy Davis

## 2024-03-13 NOTE — Discharge Instructions (Addendum)
 You are being discharged to home.  Resume your previous diet.  We are waiting for your pathology results.  Take Protonix  (pantoprazole ) 40 mg by mouth twice a day for four weeks, then decrease back to once a day. Your physician has recommended a repeat upper endoscopy in one year for surveillance.  Your physician has recommended a repeat colonoscopy in 10 years for screening purposes.

## 2024-03-15 ENCOUNTER — Other Ambulatory Visit: Payer: Self-pay | Admitting: Cardiology

## 2024-03-16 ENCOUNTER — Encounter (INDEPENDENT_AMBULATORY_CARE_PROVIDER_SITE_OTHER): Payer: Self-pay | Admitting: *Deleted

## 2024-03-16 ENCOUNTER — Encounter (HOSPITAL_COMMUNITY): Payer: Self-pay | Admitting: Gastroenterology

## 2024-03-16 ENCOUNTER — Ambulatory Visit (INDEPENDENT_AMBULATORY_CARE_PROVIDER_SITE_OTHER): Payer: Self-pay | Admitting: Gastroenterology

## 2024-03-16 LAB — SURGICAL PATHOLOGY

## 2024-03-16 NOTE — Progress Notes (Signed)
 6 mth US 

## 2024-03-20 NOTE — Progress Notes (Signed)
 1 yr EGD noted in recall Patient result letter mailed procedure note and pathology result faxed to PCP

## 2024-04-07 ENCOUNTER — Ambulatory Visit: Admitting: Nurse Practitioner

## 2024-04-27 ENCOUNTER — Other Ambulatory Visit (INDEPENDENT_AMBULATORY_CARE_PROVIDER_SITE_OTHER): Payer: Self-pay | Admitting: Gastroenterology

## 2024-07-01 ENCOUNTER — Encounter (INDEPENDENT_AMBULATORY_CARE_PROVIDER_SITE_OTHER): Payer: Self-pay | Admitting: Gastroenterology

## 2024-07-21 ENCOUNTER — Encounter (INDEPENDENT_AMBULATORY_CARE_PROVIDER_SITE_OTHER): Payer: Self-pay | Admitting: Gastroenterology

## 2024-08-04 ENCOUNTER — Other Ambulatory Visit: Payer: Self-pay | Admitting: Cardiology

## 2024-08-13 ENCOUNTER — Encounter (INDEPENDENT_AMBULATORY_CARE_PROVIDER_SITE_OTHER): Payer: Self-pay | Admitting: *Deleted
